# Patient Record
Sex: Female | Born: 1975
Health system: Southern US, Community
[De-identification: ages and names within clinical notes are randomized; demographics above are authoritative.]

## PROBLEM LIST (undated history)

## (undated) ENCOUNTER — Inpatient Hospital Stay (HOSPITAL_COMMUNITY): Payer: Self-pay

## (undated) ENCOUNTER — Emergency Department (HOSPITAL_COMMUNITY): Admission: EM | Payer: MEDICAID | Source: Home / Self Care

## (undated) DIAGNOSIS — F32A Depression, unspecified: Secondary | ICD-10-CM

## (undated) DIAGNOSIS — M1990 Unspecified osteoarthritis, unspecified site: Secondary | ICD-10-CM

## (undated) DIAGNOSIS — R519 Headache, unspecified: Secondary | ICD-10-CM

## (undated) DIAGNOSIS — C50919 Malignant neoplasm of unspecified site of unspecified female breast: Secondary | ICD-10-CM

## (undated) DIAGNOSIS — Z789 Other specified health status: Secondary | ICD-10-CM

## (undated) HISTORY — PX: NO PAST SURGERIES: SHX2092

---

## 2016-08-04 ENCOUNTER — Encounter: Payer: Self-pay | Admitting: General Practice

## 2016-08-04 ENCOUNTER — Ambulatory Visit (INDEPENDENT_AMBULATORY_CARE_PROVIDER_SITE_OTHER): Payer: Medicaid Other | Admitting: General Practice

## 2016-08-04 DIAGNOSIS — Z3201 Encounter for pregnancy test, result positive: Secondary | ICD-10-CM | POA: Diagnosis present

## 2016-08-04 LAB — POCT PREGNANCY, URINE: PREG TEST UR: POSITIVE — AB

## 2016-08-04 NOTE — Progress Notes (Signed)
Patient here for UPT today. UPT +. Reports first positive home test 2 days ago. LMP approximately 06/28/16 plus or minus one day in either direction. EDD 04/04/17. Patient is currently taking multivitamin, folic acid, fish oil & biotin vitamins. Takes tylenol PM as needed. No prescription medications. Recommended she begin prenatal care in 5-6 weeks at office of her choice. No medical history. Also recommended that she could discontinue multivitamin and folic acid & only take PNV if she would like. Patient verbalized understanding to all. Pregnancy verification letter provided. Patient had no questions

## 2016-08-10 ENCOUNTER — Encounter (HOSPITAL_COMMUNITY): Payer: Self-pay | Admitting: *Deleted

## 2016-08-10 ENCOUNTER — Inpatient Hospital Stay (HOSPITAL_COMMUNITY)
Admission: AD | Admit: 2016-08-10 | Discharge: 2016-08-10 | Disposition: A | Discharge status: Home / Self Care | Payer: Medicaid Other | Source: Ambulatory Visit | Attending: Obstetrics & Gynecology | Admitting: Obstetrics & Gynecology

## 2016-08-10 DIAGNOSIS — O210 Mild hyperemesis gravidarum: Secondary | ICD-10-CM | POA: Diagnosis not present

## 2016-08-10 DIAGNOSIS — Z3A01 Less than 8 weeks gestation of pregnancy: Secondary | ICD-10-CM | POA: Diagnosis not present

## 2016-08-10 DIAGNOSIS — R1030 Lower abdominal pain, unspecified: Secondary | ICD-10-CM | POA: Insufficient documentation

## 2016-08-10 LAB — URINALYSIS, ROUTINE W REFLEX MICROSCOPIC
Bilirubin Urine: NEGATIVE
GLUCOSE, UA: NEGATIVE mg/dL
Ketones, ur: NEGATIVE mg/dL
LEUKOCYTES UA: NEGATIVE
Nitrite: NEGATIVE
PH: 5.5 (ref 5.0–8.0)
Protein, ur: 30 mg/dL — AB
SPECIFIC GRAVITY, URINE: 1.025 (ref 1.005–1.030)

## 2016-08-10 LAB — URINE MICROSCOPIC-ADD ON

## 2016-08-10 MED ORDER — PROMETHAZINE HCL 25 MG PO TABS: 25.0000 mg | ORAL_TABLET | Freq: Four times a day (QID) | ORAL | 0 refills | Status: DC | PRN

## 2016-08-10 NOTE — Discharge Instructions (Signed)
Return if you have worsening abdominal pain or vaginal bleeding. You can use tylenol for pain or Vitamin B6 and Plain Unisom at night for feeling sick. Drink 8 glasses of water every day.   Morning Sickness Morning sickness is when you feel sick to your stomach (nauseous) during pregnancy. You may feel sick to your stomach and throw up (vomit). You may feel sick in the morning, but you can feel this way any time of day. Some women feel very sick to their stomach and cannot stop throwing up (hyperemesis gravidarum). HOME CARE  Only take medicines as told by your doctor.  Take multivitamins as told by your doctor. Taking multivitamins before getting pregnant can stop or lessen the harshness of morning sickness.  Eat dry toast or unsalted crackers before getting out of bed.  Eat 5 to 6 small meals a day.  Eat dry and bland foods like rice and baked potatoes.  Do not drink liquids with meals. Drink between meals.  Do not eat greasy, fatty, or spicy foods.  Have someone cook for you if the smell of food causes you to feel sick or throw up.  If you feel sick to your stomach after taking prenatal vitamins, take them at night or with a snack.  Eat protein when you need a snack (nuts, yogurt, cheese).  Eat unsweetened gelatins for dessert.  Wear a bracelet used for sea sickness (acupressure wristband).  Go to a doctor that puts thin needles into certain body points (acupuncture) to improve how you feel.  Do not smoke.  Use a humidifier to keep the air in your house free of odors.  Get lots of fresh air. GET HELP IF:  You need medicine to feel better.  You feel dizzy or lightheaded.  You are losing weight. GET HELP RIGHT AWAY IF:   You feel very sick to your stomach and cannot stop throwing up.  You pass out (faint). MAKE SURE YOU:  Understand these instructions.  Will watch your condition.  Will get help right away if you are not doing well or get worse.   This  information is not intended to replace advice given to you by your health care provider. Make sure you discuss any questions you have with your health care provider.   Document Released: 12/11/2004 Document Revised: 11/24/2014 Document Reviewed: 04/20/2013 Elsevier Interactive Patient Education [employer].

## 2016-08-10 NOTE — MAU Note (Addendum)
Pt presents to MAU with complaints of feeling nauseous for the last couple of days. Denies any vaginal bleeding or abnormal discharge  Pt reports cramping on and off, denies pain at present

## 2016-08-10 NOTE — MAU Provider Note (Signed)
History     CSN: [id number]  Arrival date and time: 08/10/16 1133   First Provider Initiated Contact with Patient 08/10/16 1209      No chief complaint on file.  HPI Wanda Garcia 40 y.o. [redacted]w[redacted]d   Comes to MAU with lower abdominal pain and nausea.  Due to her age, she is concerned about the baby.  She and her husband want the baby checked to see if everything is OK.  [facility]Explained that the evaluation would take 1-2 hours to get labs and ultrasound and the husband states they cannot stay that long - he needs to go to work.  They can return later today or tomorrow.    OB History    Gravida Para Term Preterm AB Living   1             SAB TAB Ectopic Multiple Live Births                  History reviewed. No pertinent past medical history.  History reviewed. No pertinent surgical history.  History reviewed. No pertinent family history.  Social History  Substance Use Topics  . Smoking status: Never Smoker  . Smokeless tobacco: Never Used  . Alcohol use No    Allergies: Allergies no known allergies  No prescriptions prior to admission.    ROS Physical Exam   Blood pressure 130/73, pulse 95, temperature 100.1 F (37.8 C), resp. rate 18, weight 128 lb (58.1 kg), last menstrual period 07/01/2016.  Physical Exam  Nursing note and vitals reviewed. Constitutional: She is oriented to person, place, and time. She appears well-developed and well-nourished.  HENT:  Head: Normocephalic.  Eyes: EOM are normal.  Neck: Neck supple.  GI: Soft. There is tenderness. There is no rebound and no guarding.  Pain with palpation in all quadrants.  Worse in lower quadrants bilaterally.  Musculoskeletal: Normal range of motion.  Neurological: She is alert and oriented to person, place, and time.  Skin: Skin is warm and dry.  Psychiatric: She has a normal mood and affect.    MAU Course  Procedures Results for orders placed or  performed during the hospital encounter of 08/10/16 (from the past 24 hour(s))  Urinalysis, Routine w reflex microscopic (not at [facility])     Status: Abnormal   Collection Time: 08/10/16 11:30 AM  Result Value Ref Range   Color, Urine YELLOW YELLOW   APPearance CLEAR CLEAR   Specific Gravity, Urine 1.025 1.005 - 1.030   pH 5.5 5.0 - 8.0   Glucose, UA NEGATIVE NEGATIVE mg/dL   Hgb urine dipstick TRACE (A) NEGATIVE   Bilirubin Urine NEGATIVE NEGATIVE   Ketones, ur NEGATIVE NEGATIVE mg/dL   Protein, ur 30 (A) NEGATIVE mg/dL   Nitrite NEGATIVE NEGATIVE   Leukocytes, UA NEGATIVE NEGATIVE  Urine microscopic-add on     Status: Abnormal   Collection Time: 08/10/16 11:30 AM  Result Value Ref Range   Squamous Epithelial / LPF 6-30 (A) NONE SEEN   WBC, UA 0-5 0 - 5 WBC/hpf   RBC / HPF 0-5 0 - 5 RBC/hpf   Bacteria, UA RARE (A) NONE SEEN   Urine-Other MUCOUS PRESENT     MDM Encouraged client to return if she continues to have problems with abdominal pain.  Gave her prescription for Phenergan to help with nausea and advised her to take 1/2 half tablet at a time.  Client asked her partner to step  outside and she explained to me that she really is 40 years old and she is very nervous about the health of the baby due to her age.  She and her partner are from [place].  Advised we could do blood work today and she could return for the results, but the client declined any blood work for now.  Assessment and Plan  Morning sickness Abdominal pain - not evaluated  Plan Return if pain is worsening and labs and ultrasound will be done to evaluate her condition.  Advised that the pregnancy may be so early that the pregnancy may or may not be seen on ultrasound. Return if you have worsening abdominal pain or vaginal bleeding. You can use tylenol for pain or Vitamin B6 and Plain Unisom at night for feeling sick. Drink 8 glasses of water every day.   Terri L Burleson 08/10/2016, 12:19 PM

## 2016-08-18 ENCOUNTER — Inpatient Hospital Stay (HOSPITAL_COMMUNITY): Payer: Medicaid Other

## 2016-08-18 ENCOUNTER — Inpatient Hospital Stay (HOSPITAL_COMMUNITY)
Admission: AD | Admit: 2016-08-18 | Discharge: 2016-08-18 | Disposition: A | Discharge status: Home / Self Care | Payer: Medicaid Other | Source: Ambulatory Visit | Attending: Obstetrics and Gynecology | Admitting: Obstetrics and Gynecology

## 2016-08-18 ENCOUNTER — Encounter (HOSPITAL_COMMUNITY): Payer: Self-pay

## 2016-08-18 DIAGNOSIS — Z3A01 Less than 8 weeks gestation of pregnancy: Secondary | ICD-10-CM | POA: Diagnosis not present

## 2016-08-18 DIAGNOSIS — O26891 Other specified pregnancy related conditions, first trimester: Secondary | ICD-10-CM | POA: Diagnosis not present

## 2016-08-18 DIAGNOSIS — R102 Pelvic and perineal pain: Secondary | ICD-10-CM | POA: Insufficient documentation

## 2016-08-18 LAB — CBC
HCT: 36.1 % (ref 36.0–46.0)
HEMOGLOBIN: 12.2 g/dL (ref 12.0–15.0)
MCH: 25.9 pg — ABNORMAL LOW (ref 26.0–34.0)
MCHC: 33.8 g/dL (ref 30.0–36.0)
MCV: 76.6 fL — ABNORMAL LOW (ref 78.0–100.0)
PLATELETS: 144 10*3/uL — AB (ref 150–400)
RBC: 4.71 MIL/uL (ref 3.87–5.11)
RDW: 14.6 % (ref 11.5–15.5)
WBC: 9 10*3/uL (ref 4.0–10.5)

## 2016-08-18 LAB — URINALYSIS, ROUTINE W REFLEX MICROSCOPIC
Bilirubin Urine: NEGATIVE
Glucose, UA: NEGATIVE mg/dL
Hgb urine dipstick: NEGATIVE
KETONES UR: NEGATIVE mg/dL
LEUKOCYTES UA: NEGATIVE
NITRITE: NEGATIVE
PROTEIN: NEGATIVE mg/dL
Specific Gravity, Urine: 1.015 (ref 1.005–1.030)
pH: 5.5 (ref 5.0–8.0)

## 2016-08-18 LAB — WET PREP, GENITAL
Sperm: NONE SEEN
Trich, Wet Prep: NONE SEEN
Yeast Wet Prep HPF POC: NONE SEEN

## 2016-08-18 LAB — HCG, QUANTITATIVE, PREGNANCY: HCG, BETA CHAIN, QUANT, S: 74132 m[IU]/mL — AB (ref <5)

## 2016-08-18 LAB — ABO/RH: ABO/RH(D): A POS

## 2016-08-18 NOTE — Discharge Instructions (Signed)
First Trimester of Pregnancy The first trimester of pregnancy is from week 1 until the end of week 12 (months 1 through 3). A week after a sperm fertilizes an egg, the egg will implant on the wall of the uterus. This embryo will begin to develop into a baby. Genes from you and your partner are forming the baby. The female genes determine whether the baby is a boy or a girl. At 6-8 weeks, the eyes and face are formed, and the heartbeat can be seen on ultrasound. At the end of 12 weeks, all the baby's organs are formed.  Now that you are pregnant, you will want to do everything you can to have a healthy baby. Two of the most important things are to get good prenatal care and to follow your health care provider's instructions. Prenatal care is all the medical care you receive before the baby's birth. This care will help prevent, find, and treat any problems during the pregnancy and childbirth. BODY CHANGES Your body goes through many changes during pregnancy. The changes vary from woman to woman.   You may gain or lose a couple of pounds at first.  You may feel sick to your stomach (nauseous) and throw up (vomit). If the vomiting is uncontrollable, call your health care provider.  You may tire easily.  You may develop headaches that can be relieved by medicines approved by your health care provider.  You may urinate more often. Painful urination may mean you have a bladder infection.  You may develop heartburn as a result of your pregnancy.  You may develop constipation because certain hormones are causing the muscles that push waste through your intestines to slow down.  You may develop hemorrhoids or swollen, bulging veins (varicose veins).  Your breasts may begin to grow larger and become tender. Your nipples may stick out more, and the tissue that surrounds them (areola) may become darker.  Your gums may bleed and may be sensitive to brushing and flossing.  Dark spots or blotches (chloasma,  mask of pregnancy) may develop on your face. This will likely fade after the baby is born.  Your menstrual periods will stop.  You may have a loss of appetite.  You may develop cravings for certain kinds of food.  You may have changes in your emotions from day to day, such as being excited to be pregnant or being concerned that something may go wrong with the pregnancy and baby.  You may have more vivid and strange dreams.  You may have changes in your hair. These can include thickening of your hair, rapid growth, and changes in texture. Some women also have hair loss during or after pregnancy, or hair that feels dry or thin. Your hair will most likely return to normal after your baby is born. WHAT TO EXPECT AT YOUR PRENATAL VISITS During a routine prenatal visit:  You will be weighed to make sure you and the baby are growing normally.  Your blood pressure will be taken.  Your abdomen will be measured to track your baby's growth.  The fetal heartbeat will be listened to starting around week 10 or 12 of your pregnancy.  Test results from any previous visits will be discussed. Your health care provider may ask you:  How you are feeling.  If you are feeling the baby move.  If you have had any abnormal symptoms, such as leaking fluid, bleeding, severe headaches, or abdominal cramping.  If you are using any tobacco products,   including cigarettes, chewing tobacco, and electronic cigarettes.  If you have any questions. Other tests that may be performed during your first trimester include:  Blood tests to find your blood type and to check for the presence of any previous infections. They will also be used to check for low iron levels (anemia) and Rh antibodies. Later in the pregnancy, blood tests for diabetes will be done along with other tests if problems develop.  Urine tests to check for infections, diabetes, or protein in the urine.  An ultrasound to confirm the proper growth  and development of the baby.  An amniocentesis to check for possible genetic problems.  Fetal screens for spina bifida and Down syndrome.  You may need other tests to make sure you and the baby are doing well.  HIV (human immunodeficiency virus) testing. Routine prenatal testing includes screening for HIV, unless you choose not to have this test. HOME CARE INSTRUCTIONS  Medicines  Follow your health care provider's instructions regarding medicine use. Specific medicines may be either safe or unsafe to take during pregnancy.  Take your prenatal vitamins as directed.  If you develop constipation, try taking a stool softener if your health care provider approves. Diet  Eat regular, well-balanced meals. Choose a variety of foods, such as meat or vegetable-based protein, fish, milk and low-fat dairy products, vegetables, fruits, and whole grain breads and cereals. Your health care provider will help you determine the amount of weight gain that is right for you.  Avoid raw meat and uncooked cheese. These carry germs that can cause birth defects in the baby.  Eating four or five small meals rather than three large meals a day may help relieve nausea and vomiting. If you start to feel nauseous, eating a few soda crackers can be helpful. Drinking liquids between meals instead of during meals also seems to help nausea and vomiting.  If you develop constipation, eat more high-fiber foods, such as fresh vegetables or fruit and whole grains. Drink enough fluids to keep your urine clear or pale yellow. Activity and Exercise  Exercise only as directed by your health care provider. Exercising will help you:  Control your weight.  Stay in shape.  Be prepared for labor and delivery.  Experiencing pain or cramping in the lower abdomen or low back is a good sign that you should stop exercising. Check with your health care provider before continuing normal exercises.  Try to avoid standing for long  periods of time. Move your legs often if you must stand in one place for a long time.  Avoid heavy lifting.  Wear low-heeled shoes, and practice good posture.  You may continue to have sex unless your health care provider directs you otherwise. Relief of Pain or Discomfort  Wear a good support bra for breast tenderness.   Take warm sitz baths to soothe any pain or discomfort caused by hemorrhoids. Use hemorrhoid cream if your health care provider approves.   Rest with your legs elevated if you have leg cramps or low back pain.  If you develop varicose veins in your legs, wear support hose. Elevate your feet for 15 minutes, 3-4 times a day. Limit salt in your diet. Prenatal Care  Schedule your prenatal visits by the twelfth week of pregnancy. They are usually scheduled monthly at first, then more often in the last 2 months before delivery.  Write down your questions. Take them to your prenatal visits.  Keep all your prenatal visits as directed by your   health care provider. Safety  Wear your seat belt at all times when driving.  Make a list of emergency phone numbers, including numbers for family, friends, the hospital, and police and fire departments. General Tips  Ask your health care provider for a referral to a local prenatal education class. Begin classes no later than at the beginning of month 6 of your pregnancy.  Ask for help if you have counseling or nutritional needs during pregnancy. Your health care provider can offer advice or refer you to specialists for help with various needs.  Do not use hot tubs, steam rooms, or saunas.  Do not douche or use tampons or scented sanitary pads.  Do not cross your legs for long periods of time.  Avoid cat litter boxes and soil used by cats. These carry germs that can cause birth defects in the baby and possibly loss of the fetus by miscarriage or stillbirth.  Avoid all smoking, herbs, alcohol, and medicines not prescribed by  your health care provider. Chemicals in these affect the formation and growth of the baby.  Do not use any tobacco products, including cigarettes, chewing tobacco, and electronic cigarettes. If you need help quitting, ask your health care provider. You may receive counseling support and other resources to help you quit.  Schedule a dentist appointment. At home, brush your teeth with a soft toothbrush and be gentle when you floss. SEEK MEDICAL CARE IF:   You have dizziness.  You have mild pelvic cramps, pelvic pressure, or nagging pain in the abdominal area.  You have persistent nausea, vomiting, or diarrhea.  You have a bad smelling vaginal discharge.  You have pain with urination.  You notice increased swelling in your face, hands, legs, or ankles. SEEK IMMEDIATE MEDICAL CARE IF:   You have a fever.  You are leaking fluid from your vagina.  You have spotting or bleeding from your vagina.  You have severe abdominal cramping or pain.  You have rapid weight gain or loss.  You vomit blood or material that looks like coffee grounds.  You are exposed to German measles and have never had them.  You are exposed to fifth disease or chickenpox.  You develop a severe headache.  You have shortness of breath.  You have any kind of trauma, such as from a fall or a car accident.   This information is not intended to replace advice given to you by your health care provider. Make sure you discuss any questions you have with your health care provider.   Document Released: 10/28/2001 Document Revised: 11/24/2014 Document Reviewed: 09/13/2013 Elsevier Interactive Patient Education 2016 Elsevier [employer]  

## 2016-08-18 NOTE — MAU Provider Note (Signed)
History     CSN: [id number]  Arrival date and time: 08/18/16 [id number]   First Provider Initiated Contact with Patient 08/18/16 2232      Chief Complaint  Patient presents with  . Pelvic Pain   Pelvic Pain  The patient's primary symptoms include pelvic pain. This is a new problem. The current episode started 1 to 4 weeks ago. The problem occurs intermittently. The problem has been unchanged. The pain is severe. The problem affects both sides. She is pregnant. Associated symptoms include constipation. Pertinent negatives include no abdominal pain, chills, diarrhea, dysuria, fever, frequency, nausea, urgency or vomiting. The vaginal discharge was normal. There has been no bleeding. Nothing aggravates the symptoms. She has tried nothing for the symptoms.    History reviewed. No pertinent past medical history.  History reviewed. No pertinent surgical history.  History reviewed. No pertinent family history.  Social History  Substance Use Topics  . Smoking status: Never Smoker  . Smokeless tobacco: Never Used  . Alcohol use No    Allergies: No Known Allergies  Prescriptions Prior to Admission  Medication Sig Dispense Refill Last Dose  . Prenatal Vit-Fe Fumarate-FA (PRENATAL MULTIVITAMIN) TABS tablet Take 1 tablet by mouth daily at 12 noon.   08/18/2016 at Unknown time  . promethazine (PHENERGAN) 25 MG tablet Take 1 tablet (25 mg total) by mouth every 6 (six) hours as needed for nausea or vomiting. (Patient not taking: Reported on 08/18/2016) 20 tablet 0 Not Taking at Unknown time    Review of Systems  Constitutional: Negative for chills and fever.  Gastrointestinal: Positive for constipation. Negative for abdominal pain, diarrhea, nausea and vomiting.  Genitourinary: Positive for pelvic pain. Negative for dysuria, frequency and urgency.   Physical Exam   Blood pressure 111/60, pulse 84, temperature 98.7 F (37.1 C), temperature source Oral, resp. rate 18, height 4' 11.5" (1.511 m),  weight 131 lb 6.4 oz (59.6 kg), last menstrual period 07/01/2016, SpO2 100 %.  Physical Exam  Nursing note and vitals reviewed. Constitutional: She is oriented to person, place, and time. She appears well-developed and well-nourished. No distress.  HENT:  Head: Normocephalic.  Cardiovascular: Normal rate.   Respiratory: Effort normal.  GI: Soft. There is no tenderness. There is no rebound.  Neurological: She is alert and oriented to person, place, and time.  Skin: Skin is warm and dry.  Psychiatric: She has a normal mood and affect.   Results for orders placed or performed during the hospital encounter of 08/18/16 (from the past 24 hour(s))  Urinalysis, Routine w reflex microscopic (not at [facility])     Status: None   Collection Time: 08/18/16  6:47 PM  Result Value Ref Range   Color, Urine YELLOW YELLOW   APPearance CLEAR CLEAR   Specific Gravity, Urine 1.015 1.005 - 1.030   pH 5.5 5.0 - 8.0   Glucose, UA NEGATIVE NEGATIVE mg/dL   Hgb urine dipstick NEGATIVE NEGATIVE   Bilirubin Urine NEGATIVE NEGATIVE   Ketones, ur NEGATIVE NEGATIVE mg/dL   Protein, ur NEGATIVE NEGATIVE mg/dL   Nitrite NEGATIVE NEGATIVE   Leukocytes, UA NEGATIVE NEGATIVE  CBC     Status: Abnormal   Collection Time: 08/18/16  8:50 PM  Result Value Ref Range   WBC 9.0 4.0 - 10.5 K/uL   RBC 4.71 3.87 - 5.11 MIL/uL   Hemoglobin 12.2 12.0 - 15.0 g/dL   HCT 36.1 36.0 - 46.0 %   MCV 76.6 (L) 78.0 - 100.0 fL   MCH 25.9 (L)  26.0 - 34.0 pg   MCHC 33.8 30.0 - 36.0 g/dL   RDW 14.6 11.5 - 15.5 %   Platelets 144 (L) 150 - 400 K/uL  hCG, quantitative, pregnancy     Status: Abnormal   Collection Time: 08/18/16  8:50 PM  Result Value Ref Range   hCG, Beta Chain, Quant, S 74,132 (H) <5 mIU/mL  ABO/Rh     Status: None (Preliminary result)   Collection Time: 08/18/16  8:50 PM  Result Value Ref Range   ABO/RH(D) A POS    US Ob Comp Less 14 Wks  Result Date: 08/18/2016 CLINICAL DATA:  Pelvic pain for 3 weeks EXAM:  OBSTETRIC <14 WK [place] AND TRANSVAGINAL OB US TECHNIQUE: Both transabdominal and transvaginal ultrasound examinations were performed for complete evaluation of the gestation as well as the maternal uterus, adnexal regions, and pelvic cul-de-sac. Transvaginal technique was performed to assess early pregnancy. COMPARISON:  None. FINDINGS: Intrauterine gestational sac: Single Yolk sac:  Present Embryo:  Present Cardiac Activity: Present Heart Rate: 148  bpm MSD: Not applicable CRL:  [id number]  mm   7 w   1 d                  [place] EDC: 04/05/2017 Subchorionic hemorrhage:  None visualized. Maternal uterus/adnexae: Anechoic simple appearing right ovarian 3.4 x 3.1 x 3.5 cm cyst. Unremarkable left ovary measuring 4 x 1.8 x 1.9 cm. No uterine mass noted. IMPRESSION: Seven week 1 day single live intrauterine gestation. Anechoic simple appearing right ovarian 3.4 x 3.1 x 3.5 cm cyst Electronically Signed   By: Ashley Royalty M.D.   On: 08/18/2016 22:21   US Ob Transvaginal  Result Date: 08/18/2016 CLINICAL DATA:  Pelvic pain for 3 weeks EXAM: OBSTETRIC <14 WK [place] AND TRANSVAGINAL OB US TECHNIQUE: Both transabdominal and transvaginal ultrasound examinations were performed for complete evaluation of the gestation as well as the maternal uterus, adnexal regions, and pelvic cul-de-sac. Transvaginal technique was performed to assess early pregnancy. COMPARISON:  None. FINDINGS: Intrauterine gestational sac: Single Yolk sac:  Present Embryo:  Present Cardiac Activity: Present Heart Rate: 148  bpm MSD: Not applicable CRL:  [id number]  mm   7 w   1 d                  [place] EDC: 04/05/2017 Subchorionic hemorrhage:  None visualized. Maternal uterus/adnexae: Anechoic simple appearing right ovarian 3.4 x 3.1 x 3.5 cm cyst. Unremarkable left ovary measuring 4 x 1.8 x 1.9 cm. No uterine mass noted. IMPRESSION: Seven week 1 day single live intrauterine gestation. Anechoic simple appearing right ovarian 3.4 x 3.1 x 3.5 cm cyst Electronically Signed   By: Ashley Royalty M.D.   On: 08/18/2016 22:21    MAU Course  Procedures  MDM   Assessment and Plan   1. Pelvic pain affecting pregnancy in first trimester, antepartum    DC home Comfort measures reviewed  1stTrimester precautions  RX: none, OTC treatments discussed  Return to MAU as needed FU with OB as planned  [place] .   Contact information: [place] [address] [phone]            Mathis Bud 08/18/2016, 10:36 PM

## 2016-08-18 NOTE — MAU Note (Signed)
Pt is having pain in her lower left and right abdomen.  Pt states it burns when she pees.  Pt states she is having trouble having a bowel movement.

## 2016-08-19 LAB — GC/CHLAMYDIA PROBE AMP (~~LOC~~) NOT AT ARMC
Chlamydia: NEGATIVE
NEISSERIA GONORRHEA: NEGATIVE

## 2016-08-19 LAB — HIV ANTIBODY (ROUTINE TESTING W REFLEX): HIV SCREEN 4TH GENERATION: NONREACTIVE

## 2016-08-20 LAB — URINE CULTURE

## 2016-09-11 ENCOUNTER — Encounter (HOSPITAL_COMMUNITY): Payer: Self-pay | Admitting: Nurse Practitioner

## 2016-09-11 ENCOUNTER — Other Ambulatory Visit (HOSPITAL_COMMUNITY): Payer: Self-pay | Admitting: Nurse Practitioner

## 2016-09-11 DIAGNOSIS — Z3682 Encounter for antenatal screening for nuchal translucency: Secondary | ICD-10-CM

## 2016-09-11 DIAGNOSIS — Z3A12 12 weeks gestation of pregnancy: Secondary | ICD-10-CM

## 2016-09-11 DIAGNOSIS — O09521 Supervision of elderly multigravida, first trimester: Secondary | ICD-10-CM

## 2016-09-11 LAB — OB RESULTS CONSOLE GC/CHLAMYDIA
CHLAMYDIA, DNA PROBE: NEGATIVE
GC PROBE AMP, GENITAL: NEGATIVE

## 2016-09-11 LAB — OB RESULTS CONSOLE RUBELLA ANTIBODY, IGM: Rubella: IMMUNE

## 2016-09-11 LAB — OB RESULTS CONSOLE RPR: RPR: NONREACTIVE

## 2016-09-11 LAB — OB RESULTS CONSOLE HIV ANTIBODY (ROUTINE TESTING): HIV: NONREACTIVE

## 2016-09-11 LAB — OB RESULTS CONSOLE ANTIBODY SCREEN: ANTIBODY SCREEN: NEGATIVE

## 2016-09-11 LAB — OB RESULTS CONSOLE ABO/RH: RH Type: POSITIVE

## 2016-09-11 LAB — OB RESULTS CONSOLE HEPATITIS B SURFACE ANTIGEN: Hepatitis B Surface Ag: NEGATIVE

## 2016-09-24 ENCOUNTER — Other Ambulatory Visit (HOSPITAL_COMMUNITY): Payer: Self-pay | Admitting: *Deleted

## 2016-09-24 ENCOUNTER — Ambulatory Visit (HOSPITAL_COMMUNITY)
Admission: RE | Admit: 2016-09-24 | Discharge: 2016-09-24 | Disposition: A | Discharge status: Home / Self Care | Payer: Medicaid Other | Source: Ambulatory Visit | Attending: Nurse Practitioner | Admitting: Nurse Practitioner

## 2016-09-24 ENCOUNTER — Encounter (HOSPITAL_COMMUNITY): Payer: Self-pay

## 2016-09-24 DIAGNOSIS — Z3A12 12 weeks gestation of pregnancy: Secondary | ICD-10-CM | POA: Diagnosis not present

## 2016-09-24 DIAGNOSIS — O09521 Supervision of elderly multigravida, first trimester: Secondary | ICD-10-CM | POA: Diagnosis not present

## 2016-09-24 DIAGNOSIS — Z3682 Encounter for antenatal screening for nuchal translucency: Secondary | ICD-10-CM | POA: Diagnosis not present

## 2016-09-24 DIAGNOSIS — Z315 Encounter for genetic counseling: Secondary | ICD-10-CM | POA: Insufficient documentation

## 2016-09-24 DIAGNOSIS — O09511 Supervision of elderly primigravida, first trimester: Secondary | ICD-10-CM

## 2016-09-24 DIAGNOSIS — O09519 Supervision of elderly primigravida, unspecified trimester: Secondary | ICD-10-CM

## 2016-09-24 HISTORY — DX: Other specified health status: Z78.9

## 2016-09-24 NOTE — Progress Notes (Signed)
Appointment Date:  DOB: 1976/07/16 Referring Provider: Nicholaus Bloom, NP Attending:  Mrs. Lyndsi Mauger and her husband, Temple Pacini, were seen for genetic counseling because of a maternal age of 40 y.o..     In summary:  Discussed AMA and associated risk for fetal aneuploidy  Discussed options for screening  First screen  Quad screen  NIPS  Ultrasound  Discussed diagnostic testing options  [facility]  Amniocentesis  Reviewed family history concerns - none reported  Discussed carrier screening options - declined  CF  SMA  Hemoglobinopathies  They were counseled regarding maternal age and the association with risk for chromosome conditions due to nondisjunction with aging of the ova.   We reviewed chromosomes, nondisjunction, and the associated 1 in 59 risk for fetal aneuploidy related to a maternal age of 40 y.o. at [redacted]w[redacted]d gestation.  They were counseled that the risk for aneuploidy decreases as gestational age increases, accounting for those pregnancies which spontaneously abort.  We specifically discussed Down syndrome (trisomy 87), trisomies 18 and 34, and sex chromosome aneuploidies (47,XXX and 47,XXY) including the common features and prognoses of each.   We reviewed available screening options including First Screen, Quad screen, noninvasive prenatal screening (NIPS)/cell free DNA (cfDNA) screening, and detailed ultrasound.  They were counseled that screening tests are used to modify a patient's a priori risk for aneuploidy, typically based on age. This estimate provides a pregnancy specific risk assessment. We reviewed the benefits and limitations of each option. Specifically, we discussed the conditions for which each test screens, the detection rates, and false positive rates of each. They were also counseled regarding diagnostic testing via [facility] and amniocentesis. We reviewed the approximate 1 in [id number] risk for complications from amniocentesis, including spontaneous pregnancy  loss. We discussed the possible results that the tests might provide including: positive, negative, unanticipated, and no result. Finally, they were counseled regarding the cost of each option and potential out of pocket expenses.  After consideration of all the options, she elected to proceed with NIPS.  Those results will be available in 8-10 days.    A nuchal translucency ultrasound was also performed today.  The report will be documented separately.  The patient would like to return for a detailed ultrasound at ~18+ weeks gestation.  This appointment was scheduled today. They understand that screening tests, including NIPS and ultrasound, cannot rule out all birth defects or genetic syndromes.   Mrs. Coulston was provided with written information regarding cystic fibrosis (CF), spinal muscular atrophy (SMA) and hemoglobinopathies including the carrier frequency, availability of carrier screening and prenatal diagnosis if indicated.  In addition, we discussed that CF and hemoglobinopathies are routinely screened for as part of the [place] newborn screening panel.  After further discussion, she  declined screening for CF, SMA and hemoglobinopathies.  Both family histories were reviewed and found to be noncontributory for birth defects, intellectual disability, and known genetic conditions. Without further information regarding the provided family history, an accurate genetic risk cannot be calculated. Further genetic counseling is warranted if more information is obtained.  Mrs. Sladek denied exposure to environmental toxins or chemical agents. She denied the use of alcohol tobacco or street drugs. She denied significant viral illnesses during the course of her pregnancy. Her medical and surgical histories were noncontributory.   I counseled this couple regarding the above risks and available options.  The approximate face-to-face time with the genetic counselor was 45 minutes.  Cam Hai, MS,   Certified Genetic Counselor

## 2016-09-25 ENCOUNTER — Other Ambulatory Visit (HOSPITAL_COMMUNITY): Payer: Self-pay | Admitting: *Deleted

## 2016-10-01 ENCOUNTER — Telehealth (HOSPITAL_COMMUNITY): Payer: Self-pay | Admitting: Genetics

## 2016-10-01 NOTE — Telephone Encounter (Signed)
Called Mr. Antony Blackbird, Ms. Olejniczak's husband, to review the Panorama results.  She had requested that I call him regarding her results when they were available.  There was no answer and I left a message for him to return my call to discuss results.

## 2016-10-01 NOTE — Telephone Encounter (Signed)
Wanda Garcia's husband, Wanda Garcia, returned my call to discuss his wife's prenatal cell free DNA screening results.  Wanda Garcia had Panorama testing through [facility].  Testing was offered because of AMA.   The patient was identified by name and DOB.  We reviewed that these are within normal limits, showing a less than 1 in 10,000 risk for trisomies 21, 18 and 13, and monosomy X (Turner syndrome).  In addition, the risk for triploidy/vanishing twin and sex chromosome trisomies (47,XXX and 47,XXY) was also low risk. We reviewed that this testing identifies > 99% of pregnancies with trisomy 15, trisomy 60, sex chromosome trisomies (47,XXX and 47,XXY), and triploidy. The detection rate for trisomy 18 is 96%.  The detection rate for monosomy X is ~92%.  The false positive rate is <0.1% for all conditions. Testing was also consistent with female fetal sex, however, Wanda Garcia did not want to know the sex of the baby until he had a chance to ask if wife if she wanted to know.  He stated that if they wanted to know he would call back when they both could be on the phone and hear at the same time.  They understand that this testing does not identify all genetic conditions.  All questions were answered to his satisfaction, he was encouraged to call with additional questions or concerns.  Cam Hai, MS Certified Genetic Counselor

## 2016-10-02 ENCOUNTER — Other Ambulatory Visit (HOSPITAL_COMMUNITY): Payer: Self-pay

## 2016-10-04 ENCOUNTER — Inpatient Hospital Stay (HOSPITAL_COMMUNITY)
Admission: AD | Admit: 2016-10-04 | Discharge: 2016-10-05 | Disposition: A | Discharge status: Home / Self Care | Payer: Medicaid Other | Source: Ambulatory Visit | Attending: Family Medicine | Admitting: Family Medicine

## 2016-10-04 DIAGNOSIS — Z3A13 13 weeks gestation of pregnancy: Secondary | ICD-10-CM | POA: Insufficient documentation

## 2016-10-04 DIAGNOSIS — B9689 Other specified bacterial agents as the cause of diseases classified elsewhere: Secondary | ICD-10-CM | POA: Insufficient documentation

## 2016-10-04 DIAGNOSIS — K5900 Constipation, unspecified: Secondary | ICD-10-CM | POA: Insufficient documentation

## 2016-10-04 DIAGNOSIS — N760 Acute vaginitis: Secondary | ICD-10-CM | POA: Insufficient documentation

## 2016-10-04 DIAGNOSIS — O23591 Infection of other part of genital tract in pregnancy, first trimester: Secondary | ICD-10-CM | POA: Insufficient documentation

## 2016-10-04 DIAGNOSIS — N888 Other specified noninflammatory disorders of cervix uteri: Secondary | ICD-10-CM

## 2016-10-04 DIAGNOSIS — O99611 Diseases of the digestive system complicating pregnancy, first trimester: Secondary | ICD-10-CM

## 2016-10-05 ENCOUNTER — Encounter (HOSPITAL_COMMUNITY): Payer: Self-pay | Admitting: Certified Nurse Midwife

## 2016-10-05 DIAGNOSIS — N888 Other specified noninflammatory disorders of cervix uteri: Secondary | ICD-10-CM

## 2016-10-05 DIAGNOSIS — N760 Acute vaginitis: Secondary | ICD-10-CM | POA: Diagnosis not present

## 2016-10-05 DIAGNOSIS — O23591 Infection of other part of genital tract in pregnancy, first trimester: Secondary | ICD-10-CM | POA: Diagnosis not present

## 2016-10-05 DIAGNOSIS — B9689 Other specified bacterial agents as the cause of diseases classified elsewhere: Secondary | ICD-10-CM | POA: Diagnosis not present

## 2016-10-05 DIAGNOSIS — K5900 Constipation, unspecified: Secondary | ICD-10-CM | POA: Diagnosis present

## 2016-10-05 DIAGNOSIS — Z3A13 13 weeks gestation of pregnancy: Secondary | ICD-10-CM | POA: Diagnosis not present

## 2016-10-05 LAB — URINALYSIS, ROUTINE W REFLEX MICROSCOPIC
BILIRUBIN URINE: NEGATIVE
Glucose, UA: NEGATIVE mg/dL
Hgb urine dipstick: NEGATIVE
Ketones, ur: NEGATIVE mg/dL
Leukocytes, UA: NEGATIVE
NITRITE: NEGATIVE
PROTEIN: NEGATIVE mg/dL
SPECIFIC GRAVITY, URINE: 1.015 (ref 1.005–1.030)
pH: 6 (ref 5.0–8.0)

## 2016-10-05 LAB — WET PREP, GENITAL
SPERM: NONE SEEN
TRICH WET PREP: NONE SEEN
YEAST WET PREP: NONE SEEN

## 2016-10-05 MED ORDER — FLEET ENEMA 7-19 GM/118ML RE ENEM
1.0000 | ENEMA | Freq: Once | RECTAL | Status: AC
  Administered 2016-10-05: 1 via RECTAL

## 2016-10-05 MED ORDER — METRONIDAZOLE 500 MG PO TABS: 500.0000 mg | ORAL_TABLET | Freq: Two times a day (BID) | ORAL | 0 refills | Status: DC

## 2016-10-05 NOTE — Discharge Instructions (Signed)
Constipation, Adult Constipation is when a person:  Poops (has a bowel movement) fewer times in a week than normal.  Has a hard time pooping.  Has poop that is dry, hard, or bigger than normal. Follow these instructions at home: Eating and drinking  Eat foods that have a lot of fiber, such as:  Fresh fruits and vegetables.  Whole grains.  Beans.  Eat less of foods that are high in fat, low in fiber, or overly processed, such as:  [place] fries.  Hamburgers.  Cookies.  Candy.  Soda.  Drink enough fluid to keep your pee (urine) clear or pale yellow. General instructions  Exercise regularly or as told by your doctor.  Go to the restroom when you feel like you need to poop. Do not hold it in.  Take over-the-counter and prescription medicines only as told by your doctor. These include any fiber supplements.  Do pelvic floor retraining exercises, such as:  Doing deep breathing while relaxing your lower belly (abdomen).  Relaxing your pelvic floor while pooping.  Watch your condition for any changes.  Keep all follow-up visits as told by your doctor. This is important. Contact a doctor if:  You have pain that gets worse.  You have a fever.  You have not pooped for 4 days.  You throw up (vomit).  You are not hungry.  You lose weight.  You are bleeding from the anus.  You have thin, pencil-like poop (stool). Get help right away if:  You have a fever, and your symptoms suddenly get worse.  You leak poop or have blood in your poop.  Your belly feels hard or bigger than normal (is bloated).  You have very bad belly pain.  You feel dizzy or you faint. This information is not intended to replace advice given to you by your health care provider. Make sure you discuss any questions you have with your health care provider. Document Released: 04/21/2008 Document Revised: 05/23/2016 Document Reviewed: 04/23/2016 Elsevier Interactive Patient Education  2017  Else[employer] Inc[employer]   Bacterial Vaginosis Bacterial vaginosis is an infection of the vagina. It happens when too many germs (bacteria) grow in the vagina. This infection puts you at risk for infections from sex (STIs). Treating this infection can lower your risk for some STIs. You should also treat this if you are pregnant. It can cause your baby to be born early. Follow these instructions at home: Medicines  Take over-the-counter and prescription medicines only as told by your doctor.  Take or use your antibiotic medicine as told by your doctor. Do not stop taking or using it even if you start to feel better. General instructions  If you your sexual partner is a woman, tell her that you have this infection. She needs to get treatment if she has symptoms. If you have a female partner, he does not need to be treated.  During treatment:  Avoid sex.  Do not douche.  Avoid alcohol as told.  Avoid breastfeeding as told.  Drink enough fluid to keep your pee (urine) clear or pale yellow.  Keep your vagina and butt (rectum) clean.  Wash the area with warm water every day.  Wipe from front to back after you use the toilet.  Keep all follow-up visits as told by your doctor. This is important. Preventing this condition  Do not douche.  Use only warm water to wash around your vagina.  Use protection when you have sex. This includes:  Latex condoms.  Dental  dams.  Limit how many people you have sex with. It is best to only have sex with the same person (be monogamous).  Get tested for STIs. Have your partner get tested.  Wear underwear that is cotton or lined with cotton.  Avoid tight pants and pantyhose. This is most important in summer.  Do not use any products that have nicotine or tobacco in them. These include cigarettes and e-cigarettes. If you need help quitting, ask your doctor.  Do not use illegal drugs.  Limit how much alcohol you drink. Contact a doctor if:  Your  symptoms do not get better, even after you are treated.  You have more discharge or pain when you pee (urinate).  You have a fever.  You have pain in your belly (abdomen).  You have pain with sex.  Your bleed from your vagina between periods. Summary  This infection happens when too many germs (bacteria) grow in the vagina.  Treating this condition can lower your risk for some infections from sex (STIs).  You should also treat this if you are pregnant. It can cause early (premature) birth.  Do not stop taking or using your antibiotic medicine even if you start to feel better. This information is not intended to replace advice given to you by your health care provider. Make sure you discuss any questions you have with your health care provider. Document Released: 08/12/2008 Document Revised: 07/19/2016 Document Reviewed: 07/19/2016 Elsevier Interactive Patient Education  2017 [employer].

## 2016-10-05 NOTE — MAU Note (Signed)
Having pink vag bleeding on tissue and some abd pain for about 3 hours.

## 2016-10-05 NOTE — Progress Notes (Signed)
Noni Saupe NP in earlier to discuss test results and d/c plan with pt. Written and verbal d/c instructions given and understanding voiced.

## 2016-10-05 NOTE — MAU Provider Note (Signed)
History     CSN: [id number]  Arrival date and time: 10/04/16 2346   First Provider Initiated Contact with Patient 10/05/16 0026      Chief Complaint  Patient presents with  . Vaginal Bleeding  . Abdominal Pain  . Dysuria   HPI   [name] is a 40 y.o. female G1P0 @ [redacted]w[redacted]d here in MAU with constipation, spotting and abdominal pain.  She attests to having problem with constipation for the entire length of her pregnancy. She has tried many different over the counter medications with only some relief. She has had to push and bare down for BM recently. After straining today she noticed some spotting on the tissue after she wiped. The spotting was pink in color and very light.   OB History    Gravida Para Term Preterm AB Living   1             SAB TAB Ectopic Multiple Live Births                  Past Medical History:  Diagnosis Date  . Medical history non-contributory     Past Surgical History:  Procedure Laterality Date  . NO PAST SURGERIES      History reviewed. No pertinent family history.  Social History  Substance Use Topics  . Smoking status: Never Smoker  . Smokeless tobacco: Never Used  . Alcohol use No    Allergies: No Known Allergies  Prescriptions Prior to Admission  Medication Sig Dispense Refill Last Dose  . Prenatal Vit-Fe Fumarate-FA (PRENATAL MULTIVITAMIN) TABS tablet Take 1 tablet by mouth daily at 12 noon.   Taking  . promethazine (PHENERGAN) 25 MG tablet Take 1 tablet (25 mg total) by mouth every 6 (six) hours as needed for nausea or vomiting. (Patient not taking: Reported on 09/24/2016) 20 tablet 0 Not Taking    Results for orders placed or performed during the hospital encounter of 10/04/16 (from the past 48 hour(s))  Urinalysis, Routine w reflex microscopic (not at [facility])     Status: None   Collection Time: 10/05/16 12:10 AM  Result Value Ref Range   Color, Urine YELLOW YELLOW   APPearance CLEAR CLEAR   Specific Gravity, Urine  1.015 1.005 - 1.030   pH 6.0 5.0 - 8.0   Glucose, UA NEGATIVE NEGATIVE mg/dL   Hgb urine dipstick NEGATIVE NEGATIVE   Bilirubin Urine NEGATIVE NEGATIVE   Ketones, ur NEGATIVE NEGATIVE mg/dL   Protein, ur NEGATIVE NEGATIVE mg/dL   Nitrite NEGATIVE NEGATIVE   Leukocytes, UA NEGATIVE NEGATIVE    Comment: MICROSCOPIC NOT DONE ON URINES WITH NEGATIVE PROTEIN, BLOOD, LEUKOCYTES, NITRITE, OR GLUCOSE <1000 mg/dL.  Wet prep, genital     Status: Abnormal   Collection Time: 10/05/16 12:35 AM  Result Value Ref Range   Yeast Wet Prep HPF POC NONE SEEN NONE SEEN   Trich, Wet Prep NONE SEEN NONE SEEN   Clue Cells Wet Prep HPF POC PRESENT (A) NONE SEEN   WBC, Wet Prep HPF POC MODERATE (A) NONE SEEN    Comment: MANY BACTERIA SEEN   Sperm NONE SEEN     Review of Systems  Constitutional: Negative for chills and fever.  Gastrointestinal: Positive for abdominal pain and constipation. Negative for nausea and vomiting.  Genitourinary: Positive for dysuria.   Physical Exam   Blood pressure (!) 111/47, pulse 87, temperature 97.3 F (36.3 C), resp. rate 18, height 4\' 11"  (1.499 m), weight 136 lb 3.2 oz (61.8 kg),  last menstrual period 07/01/2016.  Physical Exam  Constitutional: She is oriented to person, place, and time. She appears well-developed and well-nourished. No distress.  HENT:  Head: Normocephalic.  Eyes: Pupils are equal, round, and reactive to light.  GI: Soft. Normal appearance and bowel sounds are normal. She exhibits no distension and no mass. There is no tenderness. There is no rebound and no guarding.  Genitourinary:  Genitourinary Comments: Vagina - Small amount of pink vaginal discharge, + odor  Cervix - No contact bleeding, no active bleeding  Bimanual exam: Cervix closed Uterus non tender, enlarged  Adnexa non tender, no masses bilaterally GC/Chlam, wet prep done Chaperone present for exam.   Musculoskeletal: Normal range of motion.  Neurological: She is alert and oriented  to person, place, and time.  Skin: Skin is warm. She is not diaphoretic.  Psychiatric: Her behavior is normal.    MAU Course  Procedures  None  MDM  + fetal heart tones via doppler  A positive blood type  UA Wet prep & GC pending  Spotting likely secondary to friable cervix/ straining during BM.   Assessment and Plan   A:  1. Friable cervix   2. Constipation in pregnancy in first trimester   3. BV (bacterial vaginosis)     P:  Discharge home in stable condition Rx: Flagyl Enema given per patient request, for patient to use at home. At home instructions reviewed.  Discussed management of constipation in detail. Return to MAU if symptoms worsen    Lezlie Lye, NP 10/06/2016 8:10 AM

## 2016-10-06 LAB — GC/CHLAMYDIA PROBE AMP (~~LOC~~) NOT AT ARMC
CHLAMYDIA, DNA PROBE: NEGATIVE
Neisseria Gonorrhea: NEGATIVE

## 2016-10-20 ENCOUNTER — Telehealth: Payer: Self-pay

## 2016-10-20 NOTE — Telephone Encounter (Signed)
Received message on voicemail patient is wanting to know if she is having a boy or girl. I explained to patient we will not know that information until after her u/s on 11/05/2016. Patient verbalizes  Understanding at this time.

## 2016-11-05 ENCOUNTER — Ambulatory Visit (HOSPITAL_COMMUNITY)
Admission: RE | Admit: 2016-11-05 | Discharge: 2016-11-05 | Disposition: A | Discharge status: Home / Self Care | Payer: Medicaid Other | Source: Ambulatory Visit | Attending: Obstetrics & Gynecology | Admitting: Obstetrics & Gynecology

## 2016-11-05 ENCOUNTER — Encounter (HOSPITAL_COMMUNITY): Payer: Self-pay

## 2016-11-23 ENCOUNTER — Inpatient Hospital Stay (HOSPITAL_COMMUNITY)
Admission: AD | Admit: 2016-11-23 | Discharge: 2016-11-23 | Disposition: A | Discharge status: Home / Self Care | Payer: Medicaid Other | Source: Ambulatory Visit | Attending: Obstetrics & Gynecology | Admitting: Obstetrics & Gynecology

## 2016-11-23 ENCOUNTER — Encounter (HOSPITAL_COMMUNITY): Payer: Self-pay

## 2016-11-23 DIAGNOSIS — R102 Pelvic and perineal pain: Secondary | ICD-10-CM | POA: Diagnosis not present

## 2016-11-23 DIAGNOSIS — O26891 Other specified pregnancy related conditions, first trimester: Secondary | ICD-10-CM | POA: Diagnosis not present

## 2016-11-23 DIAGNOSIS — N949 Unspecified condition associated with female genital organs and menstrual cycle: Secondary | ICD-10-CM

## 2016-11-23 DIAGNOSIS — Z3A20 20 weeks gestation of pregnancy: Secondary | ICD-10-CM | POA: Insufficient documentation

## 2016-11-23 DIAGNOSIS — O26892 Other specified pregnancy related conditions, second trimester: Secondary | ICD-10-CM | POA: Diagnosis not present

## 2016-11-23 LAB — URINALYSIS, ROUTINE W REFLEX MICROSCOPIC
Bilirubin Urine: NEGATIVE
GLUCOSE, UA: NEGATIVE mg/dL
Hgb urine dipstick: NEGATIVE
KETONES UR: NEGATIVE mg/dL
LEUKOCYTES UA: NEGATIVE
NITRITE: NEGATIVE
PROTEIN: NEGATIVE mg/dL
Specific Gravity, Urine: 1.011 (ref 1.005–1.030)
pH: 7 (ref 5.0–8.0)

## 2016-11-23 NOTE — MAU Note (Signed)
Patient presents with bilateral side and lower abdominal pain, denies bleeding

## 2016-11-23 NOTE — Discharge Instructions (Signed)
Round Ligament Pain Introduction The round ligament is a cord of muscle and tissue that helps to support the uterus. It can become a source of pain during pregnancy if it becomes stretched or twisted as the baby grows. The pain usually begins in the second trimester of pregnancy, and it can come and go until the baby is delivered. It is not a serious problem, and it does not cause harm to the baby. Round ligament pain is usually a short, sharp, and pinching pain, but it can also be a dull, lingering, and aching pain. The pain is felt in the lower side of the abdomen or in the groin. It usually starts deep in the groin and moves up to the outside of the hip area. Pain can occur with:  A sudden change in position.  Rolling over in bed.  Coughing or sneezing.  Physical activity. Follow these instructions at home: Watch your condition for any changes. Take these steps to help with your pain:  When the pain starts, relax. Then try:  Sitting down.  Flexing your knees up to your abdomen.  Lying on your side with one pillow under your abdomen and another pillow between your legs.  Sitting in a warm bath for 15-20 minutes or until the pain goes away.  Take over-the-counter and prescription medicines only as told by your health care provider.  Move slowly when you sit and stand.  Avoid long walks if they cause pain.  Stop or lessen your physical activities if they cause pain. Contact a health care provider if:  Your pain does not go away with treatment.  You feel pain in your back that you did not have before.  Your medicine is not helping. Get help right away if:  You develop a fever or chills.  You develop uterine contractions.  You develop vaginal bleeding.  You develop nausea or vomiting.  You develop diarrhea.  You have pain when you urinate. This information is not intended to replace advice given to you by your health care provider. Make sure you discuss any questions  you have with your health care provider. Document Released: 08/12/2008 Document Revised: 04/10/2016 Document Reviewed: 01/10/2015  2017 Elsevier  

## 2016-11-23 NOTE — MAU Provider Note (Signed)
History   G1 @ 20.5 wks  AMA @ 41 yo in with c/o low abd pain bilaterially that is intermittant and dull in nature. It has been going on for 3-4 days. Denies Vag bleeding or ROM.  CSN: [id number]  Arrival date & time 11/23/16  1521   None     Chief Complaint  Patient presents with  . Abdominal Pain    HPI  Past Medical History:  Diagnosis Date  . Medical history non-contributory     Past Surgical History:  Procedure Laterality Date  . NO PAST SURGERIES      History reviewed. No pertinent family history.  Social History  Substance Use Topics  . Smoking status: Never Smoker  . Smokeless tobacco: Never Used  . Alcohol use No    OB History    Gravida Para Term Preterm AB Living   1             SAB TAB Ectopic Multiple Live Births                  Review of Systems  Constitutional: Negative.   HENT: Negative.   Eyes: Negative.   Respiratory: Negative.   Cardiovascular: Negative.   Gastrointestinal: Positive for abdominal pain.  Endocrine: Negative.   Genitourinary: Negative.   Musculoskeletal: Negative.   Skin: Negative.   Allergic/Immunologic: Negative.   Neurological: Negative.   Hematological: Negative.   Psychiatric/Behavioral: Negative.     Allergies  Patient has no known allergies.  Home Medications    BP 126/60 (BP Location: Right Arm)   Pulse 79   Temp 97.7 F (36.5 C) (Oral)   Resp 18   Ht 5' (1.524 m)   Wt 147 lb 1.3 oz (66.7 kg)   LMP 07/01/2016   BMI 28.72 kg/m   Physical Exam  Constitutional: She is oriented to person, place, and time. She appears well-developed and well-nourished.  HENT:  Head: Normocephalic.  Eyes: Pupils are equal, round, and reactive to light.  Neck: Normal range of motion.  Cardiovascular: Normal rate, regular rhythm, normal heart sounds and intact distal pulses.   Pulmonary/Chest: Effort normal and breath sounds normal.  Abdominal: Soft. Bowel sounds are normal.  Genitourinary: Vagina normal and uterus  normal.  Musculoskeletal: Normal range of motion.  Neurological: She is alert and oriented to person, place, and time. She has normal reflexes.  Skin: Skin is warm and dry.  Psychiatric: She has a normal mood and affect. Her behavior is normal. Judgment and thought content normal.    MAU Course  Procedures (including critical care time)  Labs Reviewed  URINALYSIS, ROUTINE W REFLEX MICROSCOPIC   No results found.   1. Round ligament pain       MDM  SVE firm/cl/post/high. FHR sta and reg by doppler. ua WNL.  Discussed comfort measures with pt and husb. Will d/c home

## 2017-02-24 ENCOUNTER — Inpatient Hospital Stay (HOSPITAL_COMMUNITY)
Admission: AD | Admit: 2017-02-24 | Discharge: 2017-02-24 | Disposition: A | Discharge status: Home / Self Care | Payer: Medicaid Other | Source: Ambulatory Visit | Attending: Family Medicine | Admitting: Family Medicine

## 2017-02-24 ENCOUNTER — Encounter (HOSPITAL_COMMUNITY): Payer: Self-pay | Admitting: *Deleted

## 2017-02-24 DIAGNOSIS — O99353 Diseases of the nervous system complicating pregnancy, third trimester: Secondary | ICD-10-CM | POA: Diagnosis not present

## 2017-02-24 DIAGNOSIS — O09511 Supervision of elderly primigravida, first trimester: Secondary | ICD-10-CM

## 2017-02-24 DIAGNOSIS — Z3689 Encounter for other specified antenatal screening: Secondary | ICD-10-CM | POA: Diagnosis not present

## 2017-02-24 DIAGNOSIS — O368130 Decreased fetal movements, third trimester, not applicable or unspecified: Secondary | ICD-10-CM | POA: Diagnosis not present

## 2017-02-24 DIAGNOSIS — G5600 Carpal tunnel syndrome, unspecified upper limb: Secondary | ICD-10-CM

## 2017-02-24 DIAGNOSIS — G5603 Carpal tunnel syndrome, bilateral upper limbs: Secondary | ICD-10-CM | POA: Insufficient documentation

## 2017-02-24 DIAGNOSIS — O09513 Supervision of elderly primigravida, third trimester: Secondary | ICD-10-CM | POA: Insufficient documentation

## 2017-02-24 DIAGNOSIS — Z3A34 34 weeks gestation of pregnancy: Secondary | ICD-10-CM | POA: Insufficient documentation

## 2017-02-24 DIAGNOSIS — M79641 Pain in right hand: Secondary | ICD-10-CM | POA: Diagnosis present

## 2017-02-24 DIAGNOSIS — O26899 Other specified pregnancy related conditions, unspecified trimester: Secondary | ICD-10-CM | POA: Diagnosis not present

## 2017-02-24 LAB — URINALYSIS, ROUTINE W REFLEX MICROSCOPIC
Bilirubin Urine: NEGATIVE
GLUCOSE, UA: NEGATIVE mg/dL
Hgb urine dipstick: NEGATIVE
Ketones, ur: NEGATIVE mg/dL
LEUKOCYTES UA: NEGATIVE
Nitrite: NEGATIVE
PH: 5.5 (ref 5.0–8.0)
Protein, ur: NEGATIVE mg/dL
Specific Gravity, Urine: <1.005 — ABNORMAL LOW (ref 1.005–1.030)

## 2017-02-24 NOTE — MAU Note (Signed)
PT  SAYS HER HANDS  HAVE   HURT X1-2 WEEKS-  ALL  UP  TO  ELBOW- ON RIGHT  ARM -    CAN'T FEEL IN FINGER TIPS /    BOTH HANDS -  BUT  RIGHT  WORSE.      PNC-  HD- ALL OK.        LAST MOVEMENT  OF BABY TODAY  3PM- [facility]- IN TRIAGE   - 137

## 2017-02-24 NOTE — MAU Provider Note (Signed)
History     CSN: [id number]  Arrival date and time: 02/24/17 2127   First Provider Initiated Contact with Patient 02/24/17 2208      Chief Complaint  Patient presents with  . Hand Pain   G1 @34  weeks here with bilateral hand pain. Sx started 1 week ago. She reports pain is worst in the second fingers on both hands, R worse than L. She also experiences numbness and tingling at times. She has difficulty first thing in the am with opening and closing right hand. Denies injury to hands or fingers. She also reports decreased FM today. She's eating and drinking well.    OB History    Gravida Para Term Preterm AB Living   1             SAB TAB Ectopic Multiple Live Births                  Past Medical History:  Diagnosis Date  . Medical history non-contributory     Past Surgical History:  Procedure Laterality Date  . NO PAST SURGERIES      History reviewed. No pertinent family history.  Social History  Substance Use Topics  . Smoking status: Never Smoker  . Smokeless tobacco: Never Used  . Alcohol use No    Allergies: No Known Allergies  Prescriptions Prior to Admission  Medication Sig Dispense Refill Last Dose  . acetaminophen (TYLENOL) 325 MG tablet Take 650 mg by mouth every 6 (six) hours as needed for mild pain or headache.   Past Month at Unknown time  . Prenatal Vit-Fe Fumarate-FA (PRENATAL MULTIVITAMIN) TABS tablet Take 1 tablet by mouth daily at 12 noon.    02/24/2017 at Unknown time  . metroNIDAZOLE (FLAGYL) 500 MG tablet Take 1 tablet (500 mg total) by mouth 2 (two) times daily. (Patient not taking: Reported on 11/23/2016) 14 tablet 0 Completed Course at Unknown time  . promethazine (PHENERGAN) 25 MG tablet Take 1 tablet (25 mg total) by mouth every 6 (six) hours as needed for nausea or vomiting. (Patient not taking: Reported on 11/23/2016) 20 tablet 0 Not Taking at Unknown time    Review of Systems  Respiratory: Positive for shortness of breath (intermittent,  with lying down).   Cardiovascular: Negative for chest pain.  Gastrointestinal: Positive for constipation. Negative for abdominal pain.  Genitourinary: Negative for vaginal bleeding and vaginal discharge.  Neurological: Positive for weakness (fingers) and numbness (fingers). Negative for syncope, facial asymmetry and speech difficulty.   Physical Exam   Blood pressure 112/64, pulse 87, temperature 97.8 F (36.6 C), temperature source Oral, resp. rate 20, height 5' 0.5" (1.537 m), weight 75.9 kg (167 lb 4 oz), last menstrual period 07/01/2016.  Physical Exam  Constitutional: She is oriented to person, place, and time. She appears well-developed and well-nourished. No distress.  HENT:  Head: Normocephalic and atraumatic.  Neck: Normal range of motion.  Cardiovascular: Normal rate.   Respiratory: Effort normal.  GI: Soft. She exhibits no distension. There is no tenderness.  gravid  Musculoskeletal:       Right hand: She exhibits normal range of motion, no tenderness, no bony tenderness, normal capillary refill, no deformity and no swelling. Decreased strength noted. She exhibits finger abduction.       Left hand: She exhibits normal range of motion, no tenderness, no bony tenderness, normal capillary refill, no deformity and no swelling. Decreased strength noted. She exhibits finger abduction.  Neurological: She is alert and oriented to  person, place, and time. No sensory deficit.  Skin: Skin is warm and dry.  Psychiatric: She has a normal mood and affect.   EFM: 135 bpm, mod variability, + accels, no decels Toco: none  MAU Course  Procedures  MDM Reports good FM since EFM applied. Will treat for carpal tunnel. Recommend bilateral wrist splints qhs and Tylenol prn. Stable for discharge home.  Assessment and Plan   1. [redacted] weeks gestation of pregnancy   2. Decreased fetal movements in third trimester, single or unspecified fetus   3. Carpal tunnel syndrome during pregnancy   4. NST  (non-stress test) reactive    Discharge home Follow up at [facility] as scheduled Bilateral wrist splints Tylenol prn  Allergies as of 02/24/2017   No Known Allergies     Medication List    STOP taking these medications   metroNIDAZOLE 500 MG tablet Commonly known as:  FLAGYL   promethazine 25 MG tablet Commonly known as:  PHENERGAN     TAKE these medications   acetaminophen 325 MG tablet Commonly known as:  TYLENOL Take 650 mg by mouth every 6 (six) hours as needed for mild pain or headache.   prenatal multivitamin Tabs tablet Take 1 tablet by mouth daily at 12 noon.      Julianne Handler, CNM 02/24/2017, 10:21 PM

## 2017-02-24 NOTE — Discharge Instructions (Signed)
Carpal Tunnel Syndrome Carpal tunnel syndrome is a condition that causes pain in your hand and arm. The carpal tunnel is a narrow area that is on the palm side of your wrist. Repeated wrist motion or certain diseases may cause swelling in the tunnel. This swelling can pinch the main nerve in the wrist (median nerve). Follow these instructions at home: If you have a splint:   Wear it as told by your doctor. Remove it only as told by your doctor.  Loosen the splint if your fingers:  Become numb and tingle.  Turn blue and cold.  Keep the splint clean and dry. General instructions   Take over-the-counter and prescription medicines only as told by your doctor.  Rest your wrist from any activity that may be causing your pain. If needed, talk to your employer about changes that can be made in your work, such as getting a wrist pad to use while typing.  If directed, apply ice to the painful area:  Put ice in a plastic bag.  Place a towel between your skin and the bag.  Leave the ice on for 20 minutes, 2-3 times per day.  Keep all follow-up visits as told by your doctor. This is important.  Do any exercises as told by your doctor, physical therapist, or occupational therapist. Contact a doctor if:  You have new symptoms.  Medicine does not help your pain.  Your symptoms get worse. This information is not intended to replace advice given to you by your health care provider. Make sure you discuss any questions you have with your health care provider. Document Released: 10/23/2011 Document Revised: 04/10/2016 Document Reviewed: 03/21/2015 Elsevier Interactive Patient Education  2017 [place]. Fetal Movement Counts Patient Name: ________________________________________________ Patient Due Date: ____________________ What is a fetal movement count? A fetal movement count is the number of times that you feel your baby move during a certain amount of time. This may also be called a  fetal kick count. A fetal movement count is recommended for every pregnant woman. You may be asked to start counting fetal movements as early as week 28 of your pregnancy. Pay attention to when your baby is most active. You may notice your baby's sleep and wake cycles. You may also notice things that make your baby move more. You should do a fetal movement count:  When your baby is normally most active.  At the same time each day. A good time to count movements is while you are resting, after having something to eat and drink. How do I count fetal movements? 1. Find a quiet, comfortable area. Sit, or lie down on your side. 2. Write down the date, the start time and stop time, and the number of movements that you felt between those two times. Take this information with you to your health care visits. 3. For 2 hours, count kicks, flutters, swishes, rolls, and jabs. You should feel at least 10 movements during 2 hours. 4. You may stop counting after you have felt 10 movements. 5. If you do not feel 10 movements in 2 hours, have something to eat and drink. Then, keep resting and counting for 1 hour. If you feel at least 4 movements during that hour, you may stop counting. Contact a health care provider if:  You feel fewer than 4 movements in 2 hours.  Your baby is not moving like he or she usually does. Date: ____________ Start time: ____________ Stop time: ____________ Movements: ____________ Date: ____________ Start  time: ____________ Stop time: ____________ Movements: ____________ Date: ____________ Start time: ____________ Stop time: ____________ Movements: ____________ Date: ____________ Start time: ____________ Stop time: ____________ Movements: ____________ Date: ____________ Start time: ____________ Stop time: ____________ Movements: ____________ Date: ____________ Start time: ____________ Stop time: ____________ Movements: ____________ Date: ____________ Start time: ____________ Stop  time: ____________ Movements: ____________ Date: ____________ Start time: ____________ Stop time: ____________ Movements: ____________ Date: ____________ Start time: ____________ Stop time: ____________ Movements: ____________ This information is not intended to replace advice given to you by your health care provider. Make sure you discuss any questions you have with your health care provider. Document Released: 12/03/2006 Document Revised: 07/02/2016 Document Reviewed: 12/13/2015 Elsevier Interactive Patient Education  2017 [employer].

## 2017-03-09 LAB — OB RESULTS CONSOLE GBS: GBS: POSITIVE

## 2017-03-09 LAB — OB RESULTS CONSOLE GC/CHLAMYDIA
Chlamydia: NEGATIVE
GC PROBE AMP, GENITAL: NEGATIVE

## 2017-03-30 ENCOUNTER — Other Ambulatory Visit (HOSPITAL_COMMUNITY): Payer: Self-pay | Admitting: Nurse Practitioner

## 2017-03-30 DIAGNOSIS — O09523 Supervision of elderly multigravida, third trimester: Secondary | ICD-10-CM

## 2017-03-31 ENCOUNTER — Telehealth (HOSPITAL_COMMUNITY): Payer: Self-pay | Admitting: *Deleted

## 2017-03-31 NOTE — Telephone Encounter (Signed)
Preadmission screen  

## 2017-04-02 ENCOUNTER — Encounter (HOSPITAL_COMMUNITY): Payer: Self-pay

## 2017-04-02 ENCOUNTER — Ambulatory Visit (HOSPITAL_COMMUNITY): Payer: Medicaid Other

## 2017-04-02 ENCOUNTER — Other Ambulatory Visit: Payer: Self-pay | Admitting: Advanced Practice Midwife

## 2017-04-05 ENCOUNTER — Inpatient Hospital Stay (HOSPITAL_COMMUNITY)
Admission: RE | Admit: 2017-04-05 | Discharge: 2017-04-09 | DRG: 766 | Disposition: A | Discharge status: Home / Self Care | Payer: Medicaid Other | Source: Ambulatory Visit | Attending: Obstetrics & Gynecology | Admitting: Obstetrics & Gynecology

## 2017-04-05 ENCOUNTER — Encounter (HOSPITAL_COMMUNITY): Payer: Self-pay

## 2017-04-05 DIAGNOSIS — Z98891 History of uterine scar from previous surgery: Secondary | ICD-10-CM

## 2017-04-05 DIAGNOSIS — O99824 Streptococcus B carrier state complicating childbirth: Secondary | ICD-10-CM | POA: Diagnosis not present

## 2017-04-05 DIAGNOSIS — Z3493 Encounter for supervision of normal pregnancy, unspecified, third trimester: Secondary | ICD-10-CM | POA: Diagnosis present

## 2017-04-05 DIAGNOSIS — Z3A39 39 weeks gestation of pregnancy: Secondary | ICD-10-CM | POA: Diagnosis not present

## 2017-04-05 DIAGNOSIS — O09511 Supervision of elderly primigravida, first trimester: Secondary | ICD-10-CM | POA: Diagnosis present

## 2017-04-05 LAB — CBC
HEMATOCRIT: 37.4 % (ref 36.0–46.0)
HEMOGLOBIN: 12.3 g/dL (ref 12.0–15.0)
MCH: 26.9 pg (ref 26.0–34.0)
MCHC: 32.9 g/dL (ref 30.0–36.0)
MCV: 81.7 fL (ref 78.0–100.0)
Platelets: 201 10*3/uL (ref 150–400)
RBC: 4.58 MIL/uL (ref 3.87–5.11)
RDW: 14.2 % (ref 11.5–15.5)
WBC: 8.8 10*3/uL (ref 4.0–10.5)

## 2017-04-05 LAB — TYPE AND SCREEN
ABO/RH(D): A POS
Antibody Screen: NEGATIVE

## 2017-04-05 MED ORDER — PENICILLIN G POT IN DEXTROSE 60000 UNIT/ML IV SOLN
3.0000 10*6.[IU] | INTRAVENOUS | Status: DC
  Administered 2017-04-06 (×2): 3 10*6.[IU] via INTRAVENOUS
  Filled 2017-04-05 (×7): qty 50

## 2017-04-05 MED ORDER — EPHEDRINE 5 MG/ML INJ: 10.0000 mg | INTRAVENOUS | Status: DC | PRN

## 2017-04-05 MED ORDER — DIPHENHYDRAMINE HCL 50 MG/ML IJ SOLN: 12.5000 mg | INTRAMUSCULAR | Status: DC | PRN

## 2017-04-05 MED ORDER — LIDOCAINE HCL (PF) 1 % IJ SOLN: 30.0000 mL | INTRAMUSCULAR | Status: DC | PRN

## 2017-04-05 MED ORDER — OXYTOCIN BOLUS FROM INFUSION: 500.0000 mL | Freq: Once | INTRAVENOUS | Status: DC

## 2017-04-05 MED ORDER — MISOPROSTOL 25 MCG QUARTER TABLET
25.0000 ug | ORAL_TABLET | ORAL | Status: DC | PRN
  Administered 2017-04-05: 25 ug via VAGINAL
  Filled 2017-04-05: qty 1

## 2017-04-05 MED ORDER — ACETAMINOPHEN 325 MG PO TABS: 650.0000 mg | ORAL_TABLET | ORAL | Status: DC | PRN

## 2017-04-05 MED ORDER — FENTANYL 2.5 MCG/ML BUPIVACAINE 1/10 % EPIDURAL INFUSION (WH - ANES)
14.0000 mL/h | INTRAMUSCULAR | Status: DC | PRN
  Administered 2017-04-06: 14 mL/h via EPIDURAL
  Filled 2017-04-05 (×2): qty 100

## 2017-04-05 MED ORDER — NALBUPHINE HCL 10 MG/ML IJ SOLN
5.0000 mg | INTRAMUSCULAR | Status: DC | PRN
  Administered 2017-04-05 (×2): 5 mg via INTRAVENOUS
  Filled 2017-04-05 (×2): qty 1

## 2017-04-05 MED ORDER — DEXTROSE 5 % IV SOLN
5.0000 10*6.[IU] | Freq: Once | INTRAVENOUS | Status: AC
  Administered 2017-04-06: 5 10*6.[IU] via INTRAVENOUS
  Filled 2017-04-05 (×2): qty 5

## 2017-04-05 MED ORDER — SOD CITRATE-CITRIC ACID 500-334 MG/5ML PO SOLN
30.0000 mL | ORAL | Status: DC | PRN
  Administered 2017-04-06: 30 mL via ORAL
  Filled 2017-04-05: qty 15

## 2017-04-05 MED ORDER — OXYCODONE-ACETAMINOPHEN 5-325 MG PO TABS: 2.0000 | ORAL_TABLET | ORAL | Status: DC | PRN

## 2017-04-05 MED ORDER — ONDANSETRON HCL 4 MG/2ML IJ SOLN
4.0000 mg | Freq: Four times a day (QID) | INTRAMUSCULAR | Status: DC | PRN
  Administered 2017-04-06: 4 mg via INTRAVENOUS

## 2017-04-05 MED ORDER — PROMETHAZINE HCL 25 MG/ML IJ SOLN
12.5000 mg | Freq: Four times a day (QID) | INTRAMUSCULAR | Status: DC | PRN
  Administered 2017-04-05: 12.5 mg via INTRAVENOUS
  Filled 2017-04-05: qty 1

## 2017-04-05 MED ORDER — LACTATED RINGERS IV SOLN: 500.0000 mL | Freq: Once | INTRAVENOUS | Status: DC

## 2017-04-05 MED ORDER — OXYTOCIN 40 UNITS IN LACTATED RINGERS INFUSION - SIMPLE MED: 2.5000 [IU]/h | INTRAVENOUS | Status: DC

## 2017-04-05 MED ORDER — FENTANYL CITRATE (PF) 100 MCG/2ML IJ SOLN
100.0000 ug | INTRAMUSCULAR | Status: DC | PRN
  Administered 2017-04-05 (×2): 100 ug via INTRAVENOUS
  Filled 2017-04-05 (×2): qty 2

## 2017-04-05 MED ORDER — LACTATED RINGERS IV SOLN
500.0000 mL | INTRAVENOUS | Status: DC | PRN
  Administered 2017-04-06 (×2): 1000 mL via INTRAVENOUS

## 2017-04-05 MED ORDER — OXYTOCIN 40 UNITS IN LACTATED RINGERS INFUSION - SIMPLE MED
1.0000 m[IU]/min | INTRAVENOUS | Status: DC
  Administered 2017-04-06: 2 m[IU]/min via INTRAVENOUS
  Filled 2017-04-05: qty 1000

## 2017-04-05 MED ORDER — OXYCODONE-ACETAMINOPHEN 5-325 MG PO TABS: 1.0000 | ORAL_TABLET | ORAL | Status: DC | PRN

## 2017-04-05 MED ORDER — PHENYLEPHRINE 40 MCG/ML (10ML) SYRINGE FOR IV PUSH (FOR BLOOD PRESSURE SUPPORT)
80.0000 ug | PREFILLED_SYRINGE | INTRAVENOUS | Status: DC | PRN
  Filled 2017-04-05: qty 10

## 2017-04-05 MED ORDER — PHENYLEPHRINE 40 MCG/ML (10ML) SYRINGE FOR IV PUSH (FOR BLOOD PRESSURE SUPPORT): 80.0000 ug | PREFILLED_SYRINGE | INTRAVENOUS | Status: DC | PRN

## 2017-04-05 MED ORDER — LACTATED RINGERS IV SOLN
INTRAVENOUS | Status: DC
  Administered 2017-04-05 – 2017-04-06 (×3): via INTRAVENOUS

## 2017-04-05 MED ORDER — TERBUTALINE SULFATE 1 MG/ML IJ SOLN: 0.2500 mg | Freq: Once | INTRAMUSCULAR | Status: DC | PRN

## 2017-04-05 MED ORDER — MISOPROSTOL 25 MCG QUARTER TABLET
25.0000 ug | ORAL_TABLET | ORAL | Status: DC | PRN
  Administered 2017-04-05 (×2): 25 ug via ORAL
  Filled 2017-04-05 (×2): qty 1

## 2017-04-05 NOTE — Progress Notes (Signed)
Wanda Garcia is a 41 y.o. G1P0 at [redacted]w[redacted]d  admitted for induction of labor due to [facility].  Subjective: Patient tearful, reporting more intense contractions.  Objective: Vitals:   04/05/17 0944 04/05/17 1308 04/05/17 1554 04/05/17 1948  BP:  118/68 114/61 (!) 149/79  Pulse:  78 79 74  Resp:  16  18  Temp:    97.9 F (36.6 C)  TempSrc:    Axillary  Weight: 166 lb (75.3 kg)     Height: 5\' 1"  (1.549 m)      No intake/output data recorded.  FHT:  FHR: 135 bpm, variability: moderate,  accelerations:  Present,  decelerations:  Present variable UC:   regular, every 2-3 minutes SVE:   Dilation: Closed Exam by:: Len Blalock, S-NM  Labs: Lab Results  Component Value Date   WBC 8.8 04/05/2017   HGB 12.3 04/05/2017   HCT 37.4 04/05/2017   MCV 81.7 04/05/2017   PLT 201 04/05/2017    Assessment / Plan: IUP at term. IOL for AMA. GBS pos  Called to bedside for FHR decelerations. Fluid bolus given, FHR recovered. Cervix unchanged. Try IV nubain and phenergan for pain.   Plan: Recheck in 2-3 hours or sooner if patient feels more pressure.   Len Blalock SNM 04/05/2017, 9:25 PM

## 2017-04-05 NOTE — Progress Notes (Signed)
Wanda Garcia is a 41 y.o. G1P0 at [redacted]w[redacted]d by LMP admitted for induction of labor due to [facility].  Subjective:   Objective: BP 119/70   Pulse 80   Temp 98.6 F (37 C) (Oral)   Resp 16   Ht 5\' 1"  (1.549 m)   Wt 166 lb (75.3 kg)   LMP 07/01/2016   BMI 31.37 kg/m  No intake/output data recorded. No intake/output data recorded.  FHT:  FHR: 120 bpm, variability: moderate,  accelerations:  Present,  decelerations:  Absent UC:   regular, every 2 minutes mils intensity SVE:   Dilation: Closed Exam by:: lee  Labs: Lab Results  Component Value Date   WBC 8.8 04/05/2017   HGB 12.3 04/05/2017   HCT 37.4 04/05/2017   MCV 81.7 04/05/2017   PLT 201 04/05/2017    Assessment / Plan: Induction of labor due to [place],  progressing well on pitocin  Labor: yet to be in laboe Preeclampsia:  no signs or symptoms of toxicity Fetal Wellbeing:  Category I Pain Control:  Labor support without medications I/D:  n/a Anticipated MOD:  NSVD  Wanda Garcia 04/05/2017, 2:20 PM

## 2017-04-05 NOTE — Progress Notes (Signed)
Marinell Rotundo is a 41 y.o. G1P0 at [redacted]w[redacted]d by ultrasound admitted for induction of labor due to [facility].  Subjective:   Objective: BP 114/61   Pulse 79   Temp 98.6 F (37 C) (Oral)   Resp 16   Ht 5\' 1"  (1.549 m)   Wt 166 lb (75.3 kg)   LMP 07/01/2016   BMI 31.37 kg/m  No intake/output data recorded. No intake/output data recorded.  FHT:  FHR: 145-150 bpm, variability: moderate,  accelerations:  Present,  decelerations:  Absent UC:   regular, every 2-4 minutes and mild SVE:   Dilation: Fingertip Exam by:: lee  Labs: Lab Results  Component Value Date   WBC 8.8 04/05/2017   HGB 12.3 04/05/2017   HCT 37.4 04/05/2017   MCV 81.7 04/05/2017   PLT 201 04/05/2017    Assessment / Plan: Induction of labor due to [place],  progressing well on pitocin  Labor: Progressing normally Preeclampsia:  no signs or symptoms of toxicity Fetal Wellbeing:  Category I Pain Control:  IV pain meds I/D:  n/a Anticipated MOD:  NSVD  Koren Shiver 04/05/2017, 6:05 PM

## 2017-04-05 NOTE — H&P (Signed)
Wanda Garcia is a 41 y.o. female presenting for IOL for AMA. OB History    Gravida Para Term Preterm AB Living   1             SAB TAB Ectopic Multiple Live Births                 Past Medical History:  Diagnosis Date  . Medical history non-contributory    Past Surgical History:  Procedure Laterality Date  . NO PAST SURGERIES     Family History: family history is not on file. Social History:  reports that she has never smoked. She has never used smokeless tobacco. She reports that she does not drink alcohol or use drugs.     Maternal Diabetes: No Genetic Screening: Normal Maternal Ultrasounds/Referrals: Normal Fetal Ultrasounds or other Referrals:  None Maternal Substance Abuse:  No Significant Maternal Medications:  None Significant Maternal Lab Results:  None Other Comments:  None  Review of Systems  Constitutional: Negative.   HENT: Negative.   Eyes: Negative.   Respiratory: Negative.   Cardiovascular: Negative.   Gastrointestinal: Negative.   Genitourinary: Negative.   Musculoskeletal: Negative.   Skin: Negative.   Neurological: Negative.   Endo/Heme/Allergies: Negative.   Psychiatric/Behavioral: Negative.    Maternal Medical History:  Reason for admission: IOL for AMA  Contractions: none  Fetal activity: Perceived fetal activity is normal.    Prenatal Complications - Diabetes: AMA  Dilation: Closed Exam by:: darlene lawson,cnm Blood pressure 119/70, pulse 80, temperature 98.6 F (37 C), temperature source Oral, resp. rate 16, height 5\' 1"  (1.549 m), weight 166 lb (75.3 kg), last menstrual period 07/01/2016. Maternal Exam:  Uterine Assessment: none  Abdomen: Patient reports no abdominal tenderness. Fetal presentation: vertex  Introitus: Normal vulva. Normal vagina.  Pelvis: adequate for delivery.   Cervix: Cervix evaluated by digital exam.     Fetal Exam Fetal Monitor Review: Mode: ultrasound.   Variability: moderate (6-25 bpm).   Pattern:  accelerations present.    Fetal State Assessment: Category I - tracings are normal.     Physical Exam  Constitutional: She is oriented to person, place, and time. She appears well-developed and well-nourished.  HENT:  Head: Normocephalic.  Eyes: Pupils are equal, round, and reactive to light.  Cardiovascular: Normal rate, regular rhythm, normal heart sounds and intact distal pulses.   Respiratory: Effort normal and breath sounds normal.  GI: Soft. Bowel sounds are normal.  Genitourinary: Vagina normal and uterus normal.  Musculoskeletal: Normal range of motion.  Neurological: She is alert and oriented to person, place, and time. She has normal reflexes.  Skin: Skin is warm and dry.  Psychiatric: She has a normal mood and affect. Her behavior is normal. Judgment and thought content normal.    Prenatal labs: ABO, Rh: A/Positive/-- (10/26 0000) Antibody: Negative (10/26 0000) Rubella: Immune (10/26 0000) RPR: Nonreactive (10/26 0000)  HBsAg: Negative (10/26 0000)  HIV: Non-reactive (10/26 0000)  GBS: Positive (04/23 0000)   Assessment/Plan: cytotec induction of labor   Koren Shiver 04/05/2017, 10:02 AM

## 2017-04-06 ENCOUNTER — Encounter (HOSPITAL_COMMUNITY)
Admission: RE | Disposition: A | Discharge status: Home / Self Care | Payer: Self-pay | Source: Ambulatory Visit | Attending: Obstetrics & Gynecology

## 2017-04-06 ENCOUNTER — Encounter (HOSPITAL_COMMUNITY): Payer: Self-pay

## 2017-04-06 ENCOUNTER — Inpatient Hospital Stay (HOSPITAL_COMMUNITY): Payer: Medicaid Other | Admitting: Anesthesiology

## 2017-04-06 DIAGNOSIS — Z3A39 39 weeks gestation of pregnancy: Secondary | ICD-10-CM

## 2017-04-06 DIAGNOSIS — O99824 Streptococcus B carrier state complicating childbirth: Secondary | ICD-10-CM

## 2017-04-06 LAB — RPR: RPR Ser Ql: NONREACTIVE

## 2017-04-06 SURGERY — Surgical Case
Anesthesia: Epidural

## 2017-04-06 MED ORDER — ONDANSETRON HCL 4 MG/2ML IJ SOLN
INTRAMUSCULAR | Status: AC
  Filled 2017-04-06: qty 2

## 2017-04-06 MED ORDER — FERROUS SULFATE 325 (65 FE) MG PO TABS
325.0000 mg | ORAL_TABLET | Freq: Two times a day (BID) | ORAL | Status: DC
  Administered 2017-04-07 – 2017-04-09 (×3): 325 mg via ORAL
  Filled 2017-04-06 (×3): qty 1

## 2017-04-06 MED ORDER — OXYTOCIN 40 UNITS IN LACTATED RINGERS INFUSION - SIMPLE MED: 2.5000 [IU]/h | INTRAVENOUS | Status: AC

## 2017-04-06 MED ORDER — MEPERIDINE HCL 25 MG/ML IJ SOLN: 6.2500 mg | INTRAMUSCULAR | Status: DC | PRN

## 2017-04-06 MED ORDER — BUPIVACAINE HCL (PF) 0.5 % IJ SOLN
INTRAMUSCULAR | Status: AC
  Filled 2017-04-06: qty 30

## 2017-04-06 MED ORDER — OXYCODONE HCL 5 MG PO TABS: 5.0000 mg | ORAL_TABLET | Freq: Once | ORAL | Status: DC | PRN

## 2017-04-06 MED ORDER — TETANUS-DIPHTH-ACELL PERTUSSIS 5-2.5-18.5 LF-MCG/0.5 IM SUSP: 0.5000 mL | Freq: Once | INTRAMUSCULAR | Status: DC

## 2017-04-06 MED ORDER — LIDOCAINE HCL (PF) 1 % IJ SOLN
INTRAMUSCULAR | Status: DC | PRN
  Administered 2017-04-06: 4 mL
  Administered 2017-04-06: 6 mL via EPIDURAL

## 2017-04-06 MED ORDER — PHENYLEPHRINE 40 MCG/ML (10ML) SYRINGE FOR IV PUSH (FOR BLOOD PRESSURE SUPPORT): 80.0000 ug | PREFILLED_SYRINGE | INTRAVENOUS | Status: DC | PRN

## 2017-04-06 MED ORDER — DIPHENHYDRAMINE HCL 50 MG/ML IJ SOLN: 12.5000 mg | INTRAMUSCULAR | Status: DC | PRN

## 2017-04-06 MED ORDER — LACTATED RINGERS IV SOLN
INTRAVENOUS | Status: DC | PRN
  Administered 2017-04-06: 13:00:00 via INTRAVENOUS

## 2017-04-06 MED ORDER — KETOROLAC TROMETHAMINE 30 MG/ML IJ SOLN
INTRAMUSCULAR | Status: AC
  Filled 2017-04-06: qty 1

## 2017-04-06 MED ORDER — FENTANYL 2.5 MCG/ML BUPIVACAINE 1/10 % EPIDURAL INFUSION (WH - ANES)
14.0000 mL/h | INTRAMUSCULAR | Status: DC | PRN
  Administered 2017-04-06: 14 mL/h via EPIDURAL

## 2017-04-06 MED ORDER — PHENYLEPHRINE 40 MCG/ML (10ML) SYRINGE FOR IV PUSH (FOR BLOOD PRESSURE SUPPORT)
PREFILLED_SYRINGE | INTRAVENOUS | Status: AC
  Filled 2017-04-06: qty 10

## 2017-04-06 MED ORDER — METHYLERGONOVINE MALEATE 0.2 MG/ML IJ SOLN
INTRAMUSCULAR | Status: AC
  Filled 2017-04-06: qty 1

## 2017-04-06 MED ORDER — LACTATED RINGERS IV SOLN: 500.0000 mL | Freq: Once | INTRAVENOUS | Status: DC

## 2017-04-06 MED ORDER — LACTATED RINGERS IV SOLN
INTRAVENOUS | Status: DC
  Administered 2017-04-06: 11:00:00 via INTRAUTERINE

## 2017-04-06 MED ORDER — PROMETHAZINE HCL 25 MG/ML IJ SOLN: 6.2500 mg | INTRAMUSCULAR | Status: DC | PRN

## 2017-04-06 MED ORDER — EPHEDRINE 5 MG/ML INJ: 10.0000 mg | INTRAVENOUS | Status: DC | PRN

## 2017-04-06 MED ORDER — WITCH HAZEL-GLYCERIN EX PADS: 1.0000 "application " | MEDICATED_PAD | CUTANEOUS | Status: DC | PRN

## 2017-04-06 MED ORDER — ACETAMINOPHEN 325 MG PO TABS
650.0000 mg | ORAL_TABLET | ORAL | Status: DC | PRN
  Filled 2017-04-06: qty 2

## 2017-04-06 MED ORDER — DIPHENHYDRAMINE HCL 25 MG PO CAPS: 25.0000 mg | ORAL_CAPSULE | Freq: Four times a day (QID) | ORAL | Status: DC | PRN

## 2017-04-06 MED ORDER — DIPHENHYDRAMINE HCL 25 MG PO CAPS
25.0000 mg | ORAL_CAPSULE | ORAL | Status: DC | PRN
  Filled 2017-04-06: qty 1

## 2017-04-06 MED ORDER — NALOXONE HCL 2 MG/2ML IJ SOSY
1.0000 ug/kg/h | PREFILLED_SYRINGE | INTRAVENOUS | Status: DC | PRN
  Filled 2017-04-06: qty 2

## 2017-04-06 MED ORDER — KETOROLAC TROMETHAMINE 30 MG/ML IJ SOLN: 30.0000 mg | Freq: Four times a day (QID) | INTRAMUSCULAR | Status: AC | PRN

## 2017-04-06 MED ORDER — SIMETHICONE 80 MG PO CHEW
80.0000 mg | CHEWABLE_TABLET | ORAL | Status: DC
  Administered 2017-04-06 – 2017-04-09 (×3): 80 mg via ORAL
  Filled 2017-04-06 (×5): qty 1

## 2017-04-06 MED ORDER — HYDROMORPHONE HCL 1 MG/ML IJ SOLN: 0.2500 mg | INTRAMUSCULAR | Status: DC | PRN

## 2017-04-06 MED ORDER — PHENYLEPHRINE 40 MCG/ML (10ML) SYRINGE FOR IV PUSH (FOR BLOOD PRESSURE SUPPORT)
80.0000 ug | PREFILLED_SYRINGE | INTRAVENOUS | Status: DC | PRN
  Administered 2017-04-06: 80 ug via INTRAVENOUS

## 2017-04-06 MED ORDER — MIDAZOLAM HCL 2 MG/2ML IJ SOLN
INTRAMUSCULAR | Status: AC
  Filled 2017-04-06: qty 2

## 2017-04-06 MED ORDER — OXYTOCIN 10 UNIT/ML IJ SOLN
INTRAMUSCULAR | Status: AC
  Filled 2017-04-06: qty 4

## 2017-04-06 MED ORDER — METHYLERGONOVINE MALEATE 0.2 MG/ML IJ SOLN
INTRAMUSCULAR | Status: DC | PRN
  Administered 2017-04-06: 0.2 mg via INTRAMUSCULAR

## 2017-04-06 MED ORDER — ONDANSETRON HCL 4 MG/2ML IJ SOLN: 4.0000 mg | Freq: Three times a day (TID) | INTRAMUSCULAR | Status: DC | PRN

## 2017-04-06 MED ORDER — MIDAZOLAM HCL 2 MG/2ML IJ SOLN
INTRAMUSCULAR | Status: DC | PRN
  Administered 2017-04-06: 2 mg via INTRAVENOUS

## 2017-04-06 MED ORDER — MENTHOL 3 MG MT LOZG: 1.0000 | LOZENGE | OROMUCOSAL | Status: DC | PRN

## 2017-04-06 MED ORDER — NALBUPHINE HCL 10 MG/ML IJ SOLN: 5.0000 mg | INTRAMUSCULAR | Status: DC | PRN

## 2017-04-06 MED ORDER — IBUPROFEN 600 MG PO TABS
600.0000 mg | ORAL_TABLET | Freq: Four times a day (QID) | ORAL | Status: DC
  Administered 2017-04-06 – 2017-04-09 (×11): 600 mg via ORAL
  Filled 2017-04-06 (×11): qty 1

## 2017-04-06 MED ORDER — FENTANYL CITRATE (PF) 100 MCG/2ML IJ SOLN
INTRAMUSCULAR | Status: DC | PRN
  Administered 2017-04-06: 100 ug via INTRAVENOUS

## 2017-04-06 MED ORDER — FENTANYL CITRATE (PF) 100 MCG/2ML IJ SOLN
INTRAMUSCULAR | Status: AC
  Filled 2017-04-06: qty 2

## 2017-04-06 MED ORDER — BUPIVACAINE HCL (PF) 0.5 % IJ SOLN
INTRAMUSCULAR | Status: DC | PRN
  Administered 2017-04-06: 30 mL

## 2017-04-06 MED ORDER — COCONUT OIL OIL
1.0000 "application " | TOPICAL_OIL | Status: DC | PRN
  Administered 2017-04-07: 1 via TOPICAL
  Filled 2017-04-06: qty 120

## 2017-04-06 MED ORDER — KETOROLAC TROMETHAMINE 30 MG/ML IJ SOLN
30.0000 mg | Freq: Four times a day (QID) | INTRAMUSCULAR | Status: AC | PRN
  Administered 2017-04-06: 30 mg via INTRAMUSCULAR

## 2017-04-06 MED ORDER — SODIUM CHLORIDE 0.9% FLUSH: 3.0000 mL | INTRAVENOUS | Status: DC | PRN

## 2017-04-06 MED ORDER — LACTATED RINGERS IV SOLN
INTRAVENOUS | Status: DC
  Administered 2017-04-07: 03:00:00 via INTRAVENOUS

## 2017-04-06 MED ORDER — MORPHINE SULFATE (PF) 0.5 MG/ML IJ SOLN
INTRAMUSCULAR | Status: AC
  Filled 2017-04-06: qty 10

## 2017-04-06 MED ORDER — NALOXONE HCL 0.4 MG/ML IJ SOLN: 0.4000 mg | INTRAMUSCULAR | Status: DC | PRN

## 2017-04-06 MED ORDER — OXYCODONE-ACETAMINOPHEN 5-325 MG PO TABS: 1.0000 | ORAL_TABLET | ORAL | Status: DC | PRN

## 2017-04-06 MED ORDER — SODIUM CHLORIDE 0.9 % IR SOLN
Status: DC | PRN
  Administered 2017-04-06: 200 mL

## 2017-04-06 MED ORDER — PHENYLEPHRINE HCL 10 MG/ML IJ SOLN
INTRAMUSCULAR | Status: DC | PRN
  Administered 2017-04-06 (×3): 80 ug via INTRAVENOUS

## 2017-04-06 MED ORDER — NALBUPHINE HCL 10 MG/ML IJ SOLN: 5.0000 mg | Freq: Once | INTRAMUSCULAR | Status: DC | PRN

## 2017-04-06 MED ORDER — LACTATED RINGERS IV SOLN
INTRAVENOUS | Status: DC | PRN
  Administered 2017-04-06: 40 [IU] via INTRAVENOUS

## 2017-04-06 MED ORDER — MAGNESIUM HYDROXIDE 400 MG/5ML PO SUSP: 30.0000 mL | ORAL | Status: DC | PRN

## 2017-04-06 MED ORDER — OXYCODONE-ACETAMINOPHEN 5-325 MG PO TABS
2.0000 | ORAL_TABLET | ORAL | Status: DC | PRN
  Administered 2017-04-07 – 2017-04-09 (×6): 2 via ORAL
  Filled 2017-04-06 (×7): qty 2

## 2017-04-06 MED ORDER — ZOLPIDEM TARTRATE 5 MG PO TABS: 5.0000 mg | ORAL_TABLET | Freq: Every evening | ORAL | Status: DC | PRN

## 2017-04-06 MED ORDER — DIBUCAINE 1 % RE OINT: 1.0000 "application " | TOPICAL_OINTMENT | RECTAL | Status: DC | PRN

## 2017-04-06 MED ORDER — MEASLES, MUMPS & RUBELLA VAC ~~LOC~~ INJ
0.5000 mL | INJECTION | Freq: Once | SUBCUTANEOUS | Status: DC
  Filled 2017-04-06: qty 0.5

## 2017-04-06 MED ORDER — SODIUM BICARBONATE 8.4 % IV SOLN
INTRAVENOUS | Status: DC | PRN
  Administered 2017-04-06 (×4): 5 mL via EPIDURAL

## 2017-04-06 MED ORDER — SIMETHICONE 80 MG PO CHEW
80.0000 mg | CHEWABLE_TABLET | ORAL | Status: DC | PRN
  Administered 2017-04-07: 80 mg via ORAL

## 2017-04-06 MED ORDER — SCOPOLAMINE 1 MG/3DAYS TD PT72: 1.0000 | MEDICATED_PATCH | Freq: Once | TRANSDERMAL | Status: DC

## 2017-04-06 MED ORDER — OXYCODONE HCL 5 MG/5ML PO SOLN: 5.0000 mg | Freq: Once | ORAL | Status: DC | PRN

## 2017-04-06 MED ORDER — SENNOSIDES-DOCUSATE SODIUM 8.6-50 MG PO TABS
2.0000 | ORAL_TABLET | ORAL | Status: DC
  Administered 2017-04-06 – 2017-04-09 (×3): 2 via ORAL
  Filled 2017-04-06 (×3): qty 2

## 2017-04-06 MED ORDER — PRENATAL MULTIVITAMIN CH
1.0000 | ORAL_TABLET | Freq: Every day | ORAL | Status: DC
  Administered 2017-04-07 – 2017-04-09 (×3): 1 via ORAL
  Filled 2017-04-06 (×3): qty 1

## 2017-04-06 MED ORDER — CEFAZOLIN SODIUM-DEXTROSE 2-3 GM-% IV SOLR
INTRAVENOUS | Status: DC | PRN
  Administered 2017-04-06: 2 g via INTRAVENOUS

## 2017-04-06 MED ORDER — MORPHINE SULFATE (PF) 0.5 MG/ML IJ SOLN
INTRAMUSCULAR | Status: DC | PRN
  Administered 2017-04-06: 4 mg via EPIDURAL

## 2017-04-06 SURGICAL SUPPLY — 37 items
BENZOIN TINCTURE PRP APPL 2/3 (GAUZE/BANDAGES/DRESSINGS) ×2 IMPLANT
CHLORAPREP W/TINT 26ML (MISCELLANEOUS) ×2 IMPLANT
CLAMP CORD UMBIL (MISCELLANEOUS) IMPLANT
CLOTH BEACON ORANGE TIMEOUT ST (SAFETY) ×2 IMPLANT
DRSG OPSITE POSTOP 4X10 (GAUZE/BANDAGES/DRESSINGS) ×2 IMPLANT
ELECT REM PT RETURN 9FT ADLT (ELECTROSURGICAL) ×2
ELECTRODE REM PT RTRN 9FT ADLT (ELECTROSURGICAL) ×1 IMPLANT
EXTRACTOR VACUUM M CUP 4 TUBE (SUCTIONS) IMPLANT
GAUZE SPONGE 4X4 12PLY STRL LF (GAUZE/BANDAGES/DRESSINGS) ×4 IMPLANT
GLOVE BIOGEL PI IND STRL 7.0 (GLOVE) ×3 IMPLANT
GLOVE BIOGEL PI INDICATOR 7.0 (GLOVE) ×3
GLOVE ECLIPSE 7.0 STRL STRAW (GLOVE) ×2 IMPLANT
GOWN STRL REUS W/TWL LRG LVL3 (GOWN DISPOSABLE) ×4 IMPLANT
KIT ABG SYR 3ML LUER SLIP (SYRINGE) IMPLANT
NEEDLE HYPO 22GX1.5 SAFETY (NEEDLE) ×4 IMPLANT
NEEDLE HYPO 25X5/8 SAFETYGLIDE (NEEDLE) ×2 IMPLANT
NS IRRIG 1000ML POUR BTL (IV SOLUTION) ×2 IMPLANT
PACK C SECTION WH (CUSTOM PROCEDURE TRAY) ×2 IMPLANT
PAD ABD 7.5X8 STRL (GAUZE/BANDAGES/DRESSINGS) ×4 IMPLANT
PAD ABD 8X7 1/2 STERILE (GAUZE/BANDAGES/DRESSINGS) ×2 IMPLANT
PAD OB MATERNITY 4.3X12.25 (PERSONAL CARE ITEMS) ×2 IMPLANT
PENCIL SMOKE EVAC W/HOLSTER (ELECTROSURGICAL) ×2 IMPLANT
RTRCTR C-SECT PINK 25CM LRG (MISCELLANEOUS) IMPLANT
SPONGE GAUZE 4X4 12PLY STER LF (GAUZE/BANDAGES/DRESSINGS) ×4 IMPLANT
STRIP CLOSURE SKIN 1/2X4 (GAUZE/BANDAGES/DRESSINGS) ×2 IMPLANT
SUT PDS AB 0 CTX 36 PDP370T (SUTURE) IMPLANT
SUT PLAIN 2 0 (SUTURE) ×1
SUT PLAIN 2 0 XLH (SUTURE) IMPLANT
SUT PLAIN ABS 2-0 CT1 27XMFL (SUTURE) ×1 IMPLANT
SUT VIC AB 0 CTX 36 (SUTURE) ×2
SUT VIC AB 0 CTX36XBRD ANBCTRL (SUTURE) ×2 IMPLANT
SUT VIC AB 2-0 CT1 (SUTURE) ×2 IMPLANT
SUT VIC AB 4-0 KS 27 (SUTURE) ×2 IMPLANT
SYR 30ML LL (SYRINGE) ×2 IMPLANT
SYR CONTROL 10ML LL (SYRINGE) ×2 IMPLANT
TOWEL OR 17X24 6PK STRL BLUE (TOWEL DISPOSABLE) ×2 IMPLANT
TRAY FOLEY BAG SILVER LF 14FR (SET/KITS/TRAYS/PACK) ×2 IMPLANT

## 2017-04-06 NOTE — Progress Notes (Signed)
Patient ID: [name], female   DOB: [birth year], 41 y.o.   MRN: [id number] Vitals:   04/06/17 0600 04/06/17 0630 04/06/17 0633 04/06/17 0700  BP: (!) 108/59 (!) 117/48 (!) 119/51 (!) 123/55  Pulse: 91 80 77 73  Resp: 18 16 16 18   Temp: 98.6 F (37 C)     TempSrc: Axillary     Weight:      Height:       Continues to have intermittent variables, some only down by 20 beats  Others go to 60s and last a minute  UCs every 6-7 min  Unable to advance pitocin much.  Dilation: 4 Effacement (%): 90 Cervical Position: Posterior Station: -2 Presentation: Vertex Exam by:: M Rhyland Hinderliter,CNM  Dr Elonda Husky aware, Will continue observation, amnioinfusion, Pitocin

## 2017-04-06 NOTE — Anesthesia Preprocedure Evaluation (Signed)

## 2017-04-06 NOTE — Progress Notes (Signed)
Patient seen. Doing well no complaints. She has been having large variable decelerations every few contractions. She maintains good variability and has had some accelerations. MVU have been between 60-100. Patient has been off pitocin. Will restart 2/2 at this time and monitor strip closely. If there is any worsening we will have to proceed to c-section. OR is aware as well as D. Anyanwu.  Jacquiline Doe, MD 04/06/17 9:29 AM

## 2017-04-06 NOTE — Transfer of Care (Signed)
Immediate Anesthesia Transfer of Care Note  Patient: Wanda Garcia  Procedure(s) Performed: Procedure(s): CESAREAN SECTION (N/A)  Patient Location: PACU  Anesthesia Type:Regional  Level of Consciousness: awake, alert  and oriented  Airway & Oxygen Therapy: Patient Spontanous Breathing  Post-op Assessment: Report given to RN and Post -op Vital signs reviewed and stable  Post vital signs: Reviewed and stable  Last Vitals:  Vitals:   04/06/17 1130 04/06/17 1155  BP:    Pulse:    Resp: 20   Temp:  37.1 C    Last Pain:  Vitals:   04/06/17 1155  TempSrc: Oral  PainSc:          Complications: No apparent anesthesia complications

## 2017-04-06 NOTE — Progress Notes (Signed)
Subjective: Patient comfortable with epidural.  Objective: Vitals:   04/06/17 0230 04/06/17 0235 04/06/17 0300 04/06/17 0400  BP: 119/62 125/70 (!) 107/52 121/77  Pulse: 75 81 73 72  Resp: 18 16 18 16   Temp:    98.2 F (36.8 C)  TempSrc:    Axillary  Weight:      Height:       No intake/output data recorded.  FHT:  FHR: 145 bpm, variability: moderate,  accelerations:  Present,  decelerations:  Present prolonged, variable UC:   regular, every 5-7 minutes SVE:   Dilation: 4  Effacement: 90%  Station: -2  Exam by: C. Brennan Bailey, Carmelia Roller CNM Pitocin @ 26mu/min  Labs: Lab Results  Component Value Date   WBC 8.8 04/05/2017   HGB 12.3 04/05/2017   HCT 37.4 04/05/2017   MCV 81.7 04/05/2017   PLT 201 04/05/2017    Assessment / Plan: IUP at term. IOL for AMA. GBS pos.  Called to bedside for FHR deceleration with nadir to 70s. O2 applied, pitocin stopped, and position changed. Cervix 4cm/90%/-2.   AROM with moderate meconium and blood tinged fluid with a golf ball sized clot. Bleeding did not continue. IUPC and FSE placed.   Plan: monitor FHR and vaginal bleeding closely. Restart pitocin in 30 minutes if FHR remains stable.   Len Blalock 04/06/2017, 4:06 AM

## 2017-04-06 NOTE — Op Note (Signed)
Wanda Garcia PROCEDURE DATE: 04/06/2017  PREOPERATIVE DIAGNOSES: Intrauterine pregnancy at [redacted]w[redacted]d weeks gestation; failure to progress: arrest of dilation and non-reassuring fetal status  POSTOPERATIVE DIAGNOSES: The same  PROCEDURE: Primary Low Transverse Cesarean Section  SURGEON:  Dr. Verita Schneiders  ASSISTANT:  Dr. Jacquiline Doe  INDICATIONS: Wanda Garcia is a 41 y.o. G1P0 at [redacted]w[redacted]d here for cesarean section secondary to the indications listed under preoperative diagnoses; please see preoperative note for further details.  The risks of cesarean section were discussed with the patient including but were not limited to: bleeding which may require transfusion or reoperation; infection which may require antibiotics; injury to bowel, bladder, ureters or other surrounding organs; injury to the fetus; need for additional procedures including hysterectomy in the event of a life-threatening hemorrhage; placental abnormalities wth subsequent pregnancies, incisional problems, thromboembolic phenomenon and other postoperative/anesthesia complications.   The patient concurred with the proposed plan, giving informed written consent for the procedure.    FINDINGS:  Viable female infant in cephalic presentation.  Apgars 8 and 9.  Nuchal cord x 2; moderate meconium in amniotic fluid.  Intact placenta, three vessel cord.  Normal uterus, fallopian tubes and ovaries bilaterally.  ANESTHESIA: Epidural INTRAVENOUS FLUIDS: 1000 ml ESTIMATED BLOOD LOSS: 600 ml URINE OUTPUT:  300 ml SPECIMENS: Placenta sent to L&D COMPLICATIONS: None immediate  PROCEDURE IN DETAIL:  The patient preoperatively received intravenous antibiotics and had sequential compression devices applied to her lower extremities.  She was then taken to the operating room where the epidural anesthesia was dosed up to surgical level and was found to be adequate. She was then placed in a dorsal supine position with a leftward tilt, and prepped  and draped in a sterile manner.  A foley catheter was placed into her bladder and attached to constant gravity.  After an adequate timeout was performed, a Pfannenstiel skin incision was made with scalpel and carried through to the underlying layer of fascia. The fascia was incised in the midline, and this incision was extended bilaterally using the Mayo scissors.  Kocher clamps were applied to the superior aspect of the fascial incision and the underlying rectus muscles were dissected off bluntly.  A similar process was carried out on the inferior aspect of the fascial incision. The rectus muscles were separated in the midline bluntly and the peritoneum was entered bluntly. Attention was turned to the lower uterine segment where a low transverse hysterotomy was made with a scalpel and extended bilaterally bluntly.  The infant was successfully delivered, the cord was clamped and cut after one minute, and the infant was handed over to the awaiting neonatology team. Uterine massage was then administered, and the placenta delivered intact with a three-vessel cord. The uterus was then cleared of clots and debris.  The hysterotomy was closed with 0 Vicryl in a running locked fashion, and an imbricating layer was also placed with 0 Vicryl.   The pelvis was cleared of all clot and debris. Hemostasis was confirmed on all surfaces.  The peritoneum was closed with a 0 Vicryl running stitch and the rectus muscles were reapproximated using 0 Vicryl interrupted stitches. The fascia was then closed using 0 Vicryl in a running fashion.  The subcutaneous layer was irrigated, then reapproximated with 2-0 plain gut interrupted stitches, and 30 ml of 0.5% Marcaine was injected subcutaneously around the incision.  The skin was closed with a 4-0 Vicryl subcuticular stitch. The patient tolerated the procedure well. Sponge, lap, instrument and needle counts were correct x 3.  She was taken to the recovery room in stable condition.     Verita Schneiders, MD, [place] [facility] [place], [facility]

## 2017-04-06 NOTE — Progress Notes (Signed)
Faculty Practice OB/GYN Attending Note   Subjective:  Called to evaluate patient with repetitive large variable decelerations and minimal cervical change. Decelerations occur when pitocin is turned on, resolves with turning pitocin off. Unable to get adequate contractions due to fetus' inability to tolerate frequent contractions.    Objective:  Blood pressure (!) 141/77, pulse 71, temperature 98.7 F (37.1 C), temperature source Oral, resp. rate 20, height 5\' 1"  (1.549 m), weight 166 lb (75.3 kg), last menstrual period 07/01/2016. FHT  Baseline 150 bpm, moderate variability, +accelerations, no decelerations currently; had large variable decelerations. Toco: q 5-6 minutes Gen: NAD HENT: Normocephalic, atraumatic Lungs: Normal respiratory effort Heart: Regular rate noted Abdomen: NT, gravid fundus, soft Cervix: Deferred Ext: 2+ DTRs, no edema, no cyanosis, negative Homan's sign  Assessment & Plan:  41 y.o. G1P0 at [redacted]w[redacted]d with arrest of active phase and failure of cervical dilation due to fetal intolerance of labor.  Cesarean section recommended.  The risks of cesarean section discussed with the patient included but were not limited to: bleeding which may require transfusion or reoperation; infection which may require antibiotics; injury to bowel, bladder, ureters or other surrounding organs; injury to the fetus; need for additional procedures including hysterectomy in the event of a life-threatening hemorrhage; placental abnormalities wth subsequent pregnancies, incisional problems, thromboembolic phenomenon and other postoperative/anesthesia complications. The patient concurred with the proposed plan, giving informed written consent for the procedure.   Anesthesia and OR aware. Preoperative prophylactic antibiotics and SCDs ordered on call to the OR.  To OR when ready.      Verita Schneiders, MD, [name] Attending [place], [facility]

## 2017-04-06 NOTE — Anesthesia Procedure Notes (Signed)
Epidural Patient location during procedure: OB  Staffing Anesthesiologist: Lyndle Herrlich  Preanesthetic Checklist Completed: patient identified, site marked, surgical consent, pre-op evaluation, timeout performed, IV checked, risks and benefits discussed and monitors and equipment checked  Epidural Patient position: sitting Prep: site prepped and draped and DuraPrep Patient monitoring: continuous pulse ox and blood pressure Approach: midline Location: L3-L4 Injection technique: LOR air  Needle:  Needle type: Tuohy  Needle gauge: 17 G Needle length: 9 cm and 9 Needle insertion depth: 6 cm Catheter type: closed end flexible Catheter size: 19 Gauge Catheter at skin depth: 12 cm Test dose: negative  Assessment Events: blood not aspirated, injection not painful, no injection resistance, negative IV test and no paresthesia  Additional Notes Dosing of Epidural:  1st dose, through catheter ............................................Marland Kitchen  Xylocaine 40 mg  2nd dose, through catheter, after waiting 3 minutes........Marland KitchenXylocaine 60 mg    As each dose occurred, patient was free of IV sx; and patient exhibited no evidence of SA injection.  Patient is more comfortable after epidural dosed. Please see RN's note for documentation of vital signs,and FHR which are stable.  Patient reminded not to try to ambulate with numb legs, and that an RN must be present when she attempts to get up.

## 2017-04-06 NOTE — Anesthesia Postprocedure Evaluation (Signed)
Anesthesia Post Note  Patient: Wanda Garcia  Procedure(s) Performed: Procedure(s) (LRB): CESAREAN SECTION (N/A)  Patient location during evaluation: PACU Anesthesia Type: Epidural Level of consciousness: awake and alert Pain management: pain level controlled Vital Signs Assessment: post-procedure vital signs reviewed and stable Respiratory status: spontaneous breathing, nonlabored ventilation and respiratory function stable Cardiovascular status: stable and blood pressure returned to baseline Anesthetic complications: no        Last Vitals:  Vitals:   04/06/17 1500 04/06/17 1505  BP: (!) 133/57   Pulse: 87 78  Resp: 19 20  Temp:      Last Pain:  Vitals:   04/06/17 1500  TempSrc:   PainSc: 0-No pain   Pain Goal:                 Lynda Rainwater

## 2017-04-06 NOTE — Progress Notes (Signed)
Patient ID: Wanda Garcia, female   DOB: 17-Nov-1976, 41 y.o.   MRN: [id number] Vitals:   04/06/17 0235 04/06/17 0300 04/06/17 0400 04/06/17 0430  BP: 125/70 (!) 107/52 121/77 120/69  Pulse: 81 73 72 78  Resp: 16 18 16 18   Temp:   98.2 F (36.8 C)   TempSrc:   Axillary   Weight:      Height:       FHR has had 3 deep variable decels to 60s with UCs Last UC did not have a decel  Usual measures utilized. Has IUPC/Amnioinfusion in process  Will continue processes  Dr Elonda Husky updated

## 2017-04-07 LAB — CBC
HEMATOCRIT: 31.6 % — AB (ref 36.0–46.0)
Hemoglobin: 10.6 g/dL — ABNORMAL LOW (ref 12.0–15.0)
MCH: 27.4 pg (ref 26.0–34.0)
MCHC: 33.5 g/dL (ref 30.0–36.0)
MCV: 81.7 fL (ref 78.0–100.0)
PLATELETS: 171 10*3/uL (ref 150–400)
RBC: 3.87 MIL/uL (ref 3.87–5.11)
RDW: 14.2 % (ref 11.5–15.5)
WBC: 14.9 10*3/uL — AB (ref 4.0–10.5)

## 2017-04-07 LAB — BIRTH TISSUE RECOVERY COLLECTION (PLACENTA DONATION)

## 2017-04-07 NOTE — Lactation Note (Signed)
This note was copied from a baby's chart. Lactation Consultation Note  Patient Name: Wanda Garcia [id number] Date: 04/07/2017 Reason for consult: Follow-up assessment [employer] interpreter #[id number] ULAG used for visit. FOB not present. Basic teaching reviewed with Mom. Encouraged to BF with feeding ques, 8-12 times or more in 24 hours, cluster feeding reviewed. Assisted Mom with positioning and helping baby obtain good depth with latch. Mom reports good breast changes early pregnancy but she is AMA and does report having to "take a pill to ovulate". This is 1st baby. Discussed with Mom post pumping to encourage milk production and Mom agreeable. Did not have Mom pump at this visit, asked RN to set up pump when FOB present to demonstrate how to help Mom with pumping and cleaning pump pieces. Lactation brochure left for review, advised of OP services and support group. Encouraged Mom to call for assist as needed with latch. Questions answered.   Maternal Data Has patient been taught Hand Expression?: Yes Does the patient have breastfeeding experience prior to this delivery?: No  Feeding Feeding Type: Breast Fed Length of feed: 10 min  LATCH Score/Interventions Latch: Repeated attempts needed to sustain latch, nipple held in mouth throughout feeding, stimulation needed to elicit sucking reflex. Intervention(s): Adjust position;Assist with latch;Breast massage;Breast compression  Audible Swallowing: A few with stimulation  Type of Nipple: Everted at rest and after stimulation  Comfort (Breast/Nipple): Soft / non-tender     Hold (Positioning): Assistance needed to correctly position infant at breast and maintain latch. Intervention(s): Breastfeeding basics reviewed;Support Pillows;Position options;Skin to skin  LATCH Score: 7  Lactation Tools Discussed/Used WIC Program: Yes   Consult Status Consult Status: Follow-up Date: 04/08/17 Follow-up type: In-patient    Katrine Coho 04/07/2017, 2:49 PM

## 2017-04-07 NOTE — Anesthesia Postprocedure Evaluation (Signed)
Anesthesia Post Note  Patient: Wanda Garcia  Procedure(s) Performed: Procedure(s) (LRB): CESAREAN SECTION (N/A)  Patient location during evaluation: Mother Baby Anesthesia Type: Epidural Level of consciousness: awake and alert and oriented Pain management: pain level controlled Vital Signs Assessment: post-procedure vital signs reviewed and stable Respiratory status: spontaneous breathing and nonlabored ventilation Cardiovascular status: stable Postop Assessment: no headache, no backache, patient able to bend at knees, no signs of nausea or vomiting and adequate PO intake Anesthetic complications: no        Last Vitals:  Vitals:   04/06/17 2000 04/07/17 0415  BP: 110/62 114/61  Pulse: 76 76  Resp: 18   Temp: 37 C 36.9 C    Last Pain:  Vitals:   04/07/17 0710  TempSrc:   PainSc: 0-No pain   Pain Goal: Patients Stated Pain Goal: 3 (04/07/17 0015)               Willa Rough

## 2017-04-07 NOTE — Progress Notes (Signed)
POSTPARTUM PROGRESS NOTE  Post Partum Day #1  Subjective:  Wanda Garcia is a 41 y.o. G1P1001 [redacted]w[redacted]d POD#1 s/p cesarean.  No acute events overnight.  Pt denies problems with ambulating, voiding or po intake.  She denies nausea or vomiting.  Pain is moderately controlled.  She has had flatus. She has not had bowel movement.  Lochia Minimal.   Objective: Blood pressure 113/65, pulse 75, temperature 98.6 F (37 C), temperature source Oral, resp. rate 18, height 5\' 1"  (1.549 m), weight 166 lb (75.3 kg), last menstrual period 07/01/2016, SpO2 97 %, currently breastfeeding.  Physical Exam:  General: alert, cooperative and no distress Lochia:normal flow Chest: CTAB Heart: RRR no m/r/g Abdomen: +BS, soft, nontender, bandage in place with no new bleeding Uterine Fundus: firm, below umbilicus DVT Evaluation: No calf swelling or tenderness Extremities: minimal pedal edema   Recent Labs  04/05/17 0938 04/07/17 0539  HGB 12.3 10.6*  HCT 37.4 31.6*    Assessment/Plan:  ASSESSMENT: Wanda Garcia is a 41 y.o. G1P1001 [redacted]w[redacted]d day #1 s/p cesarean delivery, doing well  Continue normal postpartum care.   LOS: 2 days   Everrett Coombe, MD PGY-1 [facility]  04/07/2017, 11:36 AM

## 2017-04-07 NOTE — Addendum Note (Signed)
Addendum  created 04/07/17 0737 by Jonna Munro, CRNA   Sign clinical note

## 2017-04-08 NOTE — Progress Notes (Signed)
POSTPARTUM PROGRESS NOTE  Post Partum Day #2  Subjective:  Wanda Garcia is a 41 y.o. G1P1001 [redacted]w[redacted]d POD#1 s/p cesarean.  No acute events overnight.  Pt denies problems with ambulating, voiding or po intake.  She denies nausea or vomiting.  Pain is currently not well-controlled. Reports increased pain this morning which she attributes to walking yesterday and sleeping on her side overnight. Pain primarily located on L at incision site. She has had flatus. She has had bowel movement.  Lochia Minimal.   Objective: Blood pressure 113/65, pulse 75, temperature 98.6 F (37 C), temperature source Oral, resp. rate 18, height 5\' 1"  (1.549 m), weight 166 lb (75.3 kg), last menstrual period 07/01/2016, SpO2 97 %, currently breastfeeding.  Physical Exam:  General: alert, cooperative and no distress Lochia:normal flow Chest: CTAB Heart: RRR no m/r/g Abdomen: +BS, soft, nontender, bandage clean dry and intact Uterine Fundus: firm, below umbilicus DVT Evaluation: No calf swelling or tenderness Extremities: No pedal edema   Recent Labs  04/05/17 0938 04/07/17 0539  HGB 12.3 10.6*  HCT 37.4 31.6*    Assessment/Plan:  ASSESSMENT: Wanda Garcia is a 41 y.o. G1P1001 [redacted]w[redacted]d day #1 s/p cesarean delivery, doing well.  Continue normal postpartum care.   LOS: 3 days   Adin Hector, MD PGY-2 [facility]  04/08/2017, 7:41 AM   OB FELLOW POSTPARTUM PROGRESS NOTE ATTESTATION  I confirm that I have verified the information documented in the resident's note and that I have also personally reperformed the physical exam and all medical decision making activities.     Jacquiline Doe, MD 9:31 AM

## 2017-04-08 NOTE — Lactation Note (Signed)
This note was copied from a baby's chart. Lactation Consultation Note  Patient Name: Wanda Garcia UMPNT'I Date: 04/08/2017   [name] is nursing often and well per mom.  Discussed cluster feeding.  Mom c/o small blister on tip of left nipple.  Nipple appears intact.  Encouraged mom to wait for a wide open mouth prior to latching.  She is using coconut oil for soreness.  Instructed to feed with any feeding cue and call for assist/concerns prn.  Maternal Data    Feeding Feeding Type: Breast Fed Length of feed: 23 min  LATCH Score/Interventions                      Lactation Tools Discussed/Used     Consult Status      Ave Filter 04/08/2017, 2:16 PM

## 2017-04-09 MED ORDER — OXYCODONE-ACETAMINOPHEN 5-325 MG PO TABS: 1.0000 | ORAL_TABLET | ORAL | 0 refills | Status: DC | PRN

## 2017-04-09 NOTE — Discharge Summary (Signed)
Obstetric Discharge Summary Reason for Admission: induction of labor Prenatal Procedures: none Intrapartum Procedures: cesarean: low cervical, transverse Postpartum Procedures: none Complications-Operative and Postpartum: none  41 y.o. G1P0 at [redacted]w[redacted]d with arrest of active phase and failure of cervical dilation due to fetal intolerance of labor.  Cesarean section recommended.  The risks of cesarean section discussed with the patient included but were not limited to: bleeding which may require transfusion or reoperation; infection which may require antibiotics; injury to bowel, bladder, ureters or other surrounding organs; injury to the fetus; need for additional procedures including hysterectomy in the event of a life-threatening hemorrhage; placental abnormalities wth subsequent pregnancies, incisional problems, thromboembolic phenomenon and other postoperative/anesthesia complications. The patient concurred with the proposed plan, giving informed written consent for the procedure.   Anesthesia and OR aware. Preoperative prophylactic antibiotics and SCDs ordered on call to the OR.  To OR when ready.   Hospital Course:  Principal Problem:   S/P cesarean section Active Problems:   Advanced maternal age, primigravida in first trimester, antepartum   Wanda Garcia is a 41 y.o. G1P1001 s/p primary LTCS.  Patient was admitted for IOL for AMA.  She has postpartum course that was uncomplicated including no problems with ambulating, PO intake, urination, pain, or bleeding. The pt feels ready to go home and  will be discharged with outpatient follow-up.   Today: No acute events overnight.  Pt denies problems with ambulating, voiding or po intake.  She denies nausea or vomiting.  Pain is moderately controlled.  She has had flatus. She has not had bowel movement.  Lochia Moderate.  Plan for birth control is  still undecided.  Method of Feeding: Breast  Physical Exam:  General: alert, cooperative and  no distress Lochia: appropriate Uterine Fundus: firm Incision: healing well, no significant drainage, no dehiscence, no significant erythema DVT Evaluation: No evidence of DVT seen on physical exam.  H/H: Lab Results  Component Value Date/Time   HGB 10.6 (L) 04/07/2017 05:39 AM   HCT 31.6 (L) 04/07/2017 05:39 AM    Discharge Diagnoses: Term Pregnancy-delivered  Discharge Information: Date: 04/09/2017 Activity: pelvic rest Diet: routine  Medications: Percocet Breast feeding:  Yes Condition: stable Instructions: refer to handout Discharge to: home   Discharge Instructions    Call MD for:  difficulty breathing, headache or visual disturbances    Complete by:  As directed    Call MD for:  extreme fatigue    Complete by:  As directed    Call MD for:  hives    Complete by:  As directed    Call MD for:  persistant dizziness or light-headedness    Complete by:  As directed    Call MD for:  persistant nausea and vomiting    Complete by:  As directed    Call MD for:  redness, tenderness, or signs of infection (pain, swelling, redness, odor or green/yellow discharge around incision site)    Complete by:  As directed    Call MD for:  severe uncontrolled pain    Complete by:  As directed    Call MD for:  temperature >100.4    Complete by:  As directed    Diet - low sodium heart healthy    Complete by:  As directed    Driving restriction     Complete by:  As directed    Avoid driving for at least 2 weeks.   Lifting restrictions    Complete by:  As directed  Weight restriction of 10 lbs.   Sexual acrtivity    Complete by:  As directed    No sexual intercourse or tampon usage for six weeks        Adin Hector ,MD 04/09/2017,7:32 AM

## 2017-04-09 NOTE — Progress Notes (Signed)
CSW received consult for depression in pregnancy.  CSW reviewed MOB's medical record and does not see a depression noted in H&P, RN Admission Summary, or in PNR from [facility].  CSW is screening out referral.  Please contact CSW if current concerns arise.

## 2017-04-09 NOTE — Lactation Note (Signed)
This note was copied from a baby's chart. Lactation Consultation Note: when I arrive in room, mother was finishing pumping for 15 mins. Mothers breast are filling. Infant placed on the left breast . LC assist with latch and massaging breast while infant is suckling. Infant has wide open mouth. Mother complaints of pain with the latch. She has a tiny crack in the center of the left nipple. Taught parents to tug on the chin for wider gape. Infants lips are flanged well. Infant was given 10 ml of ebm with a curved tip syringe at the breast. Mother has a harmony hand pump and advised to pre pump to soften breast before latching infant. Advised to watch infants cues for feeding . Feed infant 8-12 times in 24 hours. Suggested frequent skin to skin. Mother advised to soften breast with frequent feedings . Mother advised to get a well fitting bra. Advised to use ice to decrease swelling as needed. Mother receptive to all teaching. She was given comfort gels. Mother has a positional strip and crack on the rt nipple. Mother is active with [facility] . Advised to phone [facility] when she gets home.  Mother informed of available lactation services and community support. Mother denies any further questions.   Patient Name: Wanda Garcia Date: 04/09/2017 Reason for consult: Follow-up assessment   Maternal Data    Feeding Feeding Type: Breast Fed Length of feed: 30 min  LATCH Score/Interventions Latch: Grasps breast easily, tongue down, lips flanged, rhythmical sucking.  Audible Swallowing: Spontaneous and intermittent  Type of Nipple: Everted at rest and after stimulation Intervention(s): Double electric pump  Comfort (Breast/Nipple): Filling, red/small blisters or bruises, mild/mod discomfort (positional strip and small crack on the rt nipple)  Problem noted: Filling;Cracked, bleeding, blisters, bruises;Mild/Moderate discomfort Interventions (Filling): Massage;Firm support Interventions  (Mild/moderate discomfort): Hand massage;Hand expression;Comfort gels  Hold (Positioning): Assistance needed to correctly position infant at breast and maintain latch. Intervention(s): Support Pillows;Position options  LATCH Score: 8  Lactation Tools Discussed/Used     Consult Status Consult Status: Complete    Darla Lesches 04/09/2017, 11:25 AM

## 2017-04-27 ENCOUNTER — Telehealth: Payer: Self-pay

## 2017-04-27 NOTE — Telephone Encounter (Signed)
Opened in error

## 2017-05-30 ENCOUNTER — Inpatient Hospital Stay (HOSPITAL_COMMUNITY)
Admission: AD | Admit: 2017-05-30 | Discharge: 2017-05-31 | Disposition: A | Discharge status: Home / Self Care | Payer: Medicaid Other | Source: Ambulatory Visit | Attending: Obstetrics and Gynecology | Admitting: Obstetrics and Gynecology

## 2017-05-30 DIAGNOSIS — B359 Dermatophytosis, unspecified: Secondary | ICD-10-CM

## 2017-05-30 DIAGNOSIS — N760 Acute vaginitis: Secondary | ICD-10-CM | POA: Diagnosis present

## 2017-05-30 LAB — URINALYSIS, ROUTINE W REFLEX MICROSCOPIC
Bilirubin Urine: NEGATIVE
Glucose, UA: NEGATIVE mg/dL
Hgb urine dipstick: NEGATIVE
Ketones, ur: NEGATIVE mg/dL
LEUKOCYTES UA: NEGATIVE
NITRITE: NEGATIVE
PROTEIN: NEGATIVE mg/dL
SPECIFIC GRAVITY, URINE: 1.001 — AB (ref 1.005–1.030)
pH: 5 (ref 5.0–8.0)

## 2017-05-30 NOTE — MAU Note (Signed)
Pt reports she has some "bumps" on her vagina at the beginning of the week. Now they have spread and they are burning and itching. Denies any discharge.Delivered on 04/06/2017

## 2017-05-31 DIAGNOSIS — B359 Dermatophytosis, unspecified: Secondary | ICD-10-CM | POA: Diagnosis not present

## 2017-05-31 LAB — WET PREP, GENITAL
CLUE CELLS WET PREP: NONE SEEN
SPERM: NONE SEEN
TRICH WET PREP: NONE SEEN
Yeast Wet Prep HPF POC: NONE SEEN

## 2017-05-31 MED ORDER — CLOTRIMAZOLE 1 % EX CREA: 1.0000 "application " | TOPICAL_CREAM | Freq: Two times a day (BID) | CUTANEOUS | 0 refills | Status: DC

## 2017-05-31 NOTE — Discharge Instructions (Signed)

## 2017-05-31 NOTE — MAU Provider Note (Signed)
  History     CSN: [id number]  Arrival date and time: 05/30/17 2326 Arabic interpreter via video present    Chief Complaint  Patient presents with  . Vaginitis   Nonpregnant female here with vaginal bumps. Sx started about 1 week ago and was just one small area at that time but has spread. Area is itchy. She tried a moisturizer and antibacterial ointment and had not relief. She has not been sexually active in 9 months and no hx of STDs. No new detergents, lotions, or soaps.   Past Medical History:  Diagnosis Date  . Medical history non-contributory     Past Surgical History:  Procedure Laterality Date  . CESAREAN SECTION N/A 04/06/2017   Procedure: CESAREAN SECTION;  Surgeon: Osborne Oman, MD;  Location: [facility];  Service: Obstetrics;  Laterality: N/A;  . NO PAST SURGERIES      No family history on file.  Social History  Substance Use Topics  . Smoking status: Never Smoker  . Smokeless tobacco: Never Used  . Alcohol use No    Allergies: No Known Allergies  Prescriptions Prior to Admission  Medication Sig Dispense Refill Last Dose  . acetaminophen (TYLENOL) 325 MG tablet Take 650 mg by mouth every 6 (six) hours as needed for mild pain, moderate pain or headache.    Past Week at Unknown time  . oxyCODONE-acetaminophen (PERCOCET/ROXICET) 5-325 MG tablet Take 1 tablet by mouth every 4 (four) hours as needed (pain scale 4-7). 30 tablet 0   . Prenatal Vit-Fe Fumarate-FA (PRENATAL MULTIVITAMIN) TABS tablet Take 1 tablet by mouth daily.    04/05/2017 at Unknown time    Review of Systems  Skin: Positive for rash.   Physical Exam   Blood pressure 108/74, pulse 75, temperature 98.2 F (36.8 C), resp. rate 18, weight 146 lb (66.2 kg), unknown if currently breastfeeding.  Physical Exam  Constitutional: She is oriented to person, place, and time. She appears well-developed and well-nourished. No distress.  HENT:  Head: Normocephalic.  Neck: Normal range of  motion.  Cardiovascular: Normal rate.   Respiratory: Effort normal.  Genitourinary:     Genitourinary Comments: Circular, erythematous raised scaly lesion x3  Musculoskeletal: Normal range of motion.  Neurological: She is alert and oriented to person, place, and time.  Skin: Skin is warm and dry.  Psychiatric: She has a normal mood and affect.    MAU Course  Procedures  MDM Labs ordered and reviewed.   Assessment and Plan   1. Ringworm    Discharge home Follow up at [facility] as needed Rx Clotrimazole  Allergies as of 05/31/2017   No Known Allergies     Medication List    STOP taking these medications   oxyCODONE-acetaminophen 5-325 MG tablet Commonly known as:  PERCOCET/ROXICET     TAKE these medications   acetaminophen 325 MG tablet Commonly known as:  TYLENOL Take 650 mg by mouth every 6 (six) hours as needed for mild pain, moderate pain or headache.   clotrimazole 1 % cream Commonly known as:  LOTRIMIN Apply 1 application topically 2 (two) times daily. 7-10 days   prenatal multivitamin Tabs tablet Take 1 tablet by mouth daily.      Julianne Handler, CNM 05/31/2017, 12:20 AM

## 2017-06-01 LAB — GC/CHLAMYDIA PROBE AMP (~~LOC~~) NOT AT ARMC
CHLAMYDIA, DNA PROBE: NEGATIVE
NEISSERIA GONORRHEA: NEGATIVE

## 2017-09-28 IMAGING — US US OB TRANSVAGINAL
1 series · 15 of 28 positions shown · non-contrast
Comparison: None.

CLINICAL DATA: Pelvic pain for 3 weeks

EXAM:
OBSTETRIC <14 WK US AND TRANSVAGINAL OB US
TECHNIQUE: Both transabdominal and transvaginal ultrasound examinations were
performed for complete evaluation of the gestation as well as the
maternal uterus, adnexal regions, and pelvic cul-de-sac.
Transvaginal technique was performed to assess early pregnancy.

[Series 1: us ob transvaginal · 15 of 70 slices shown]
[im 1/70]
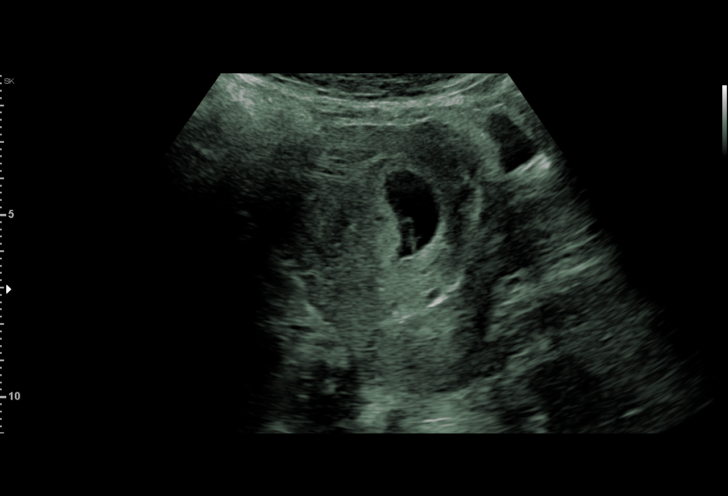
[im 6/70]
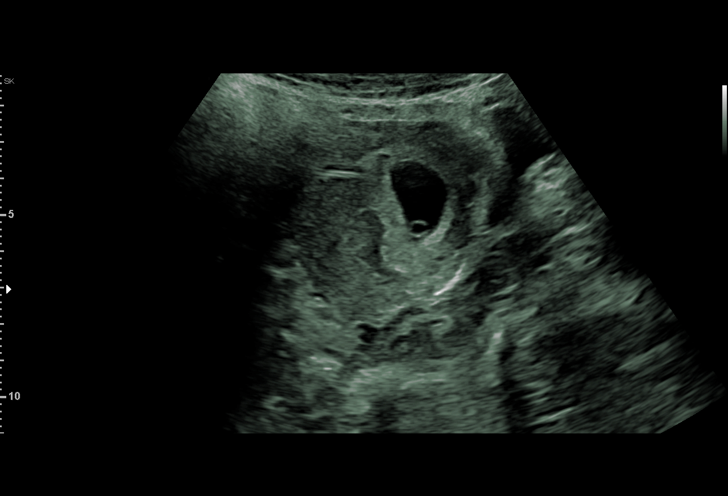
[im 11/70]
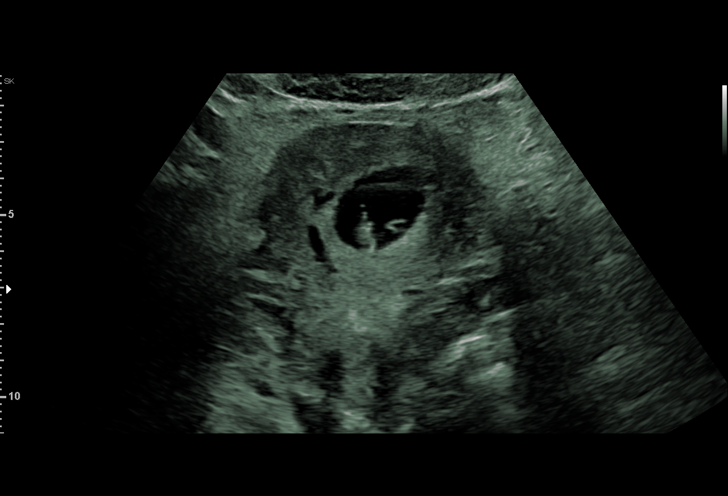
[im 16/70]
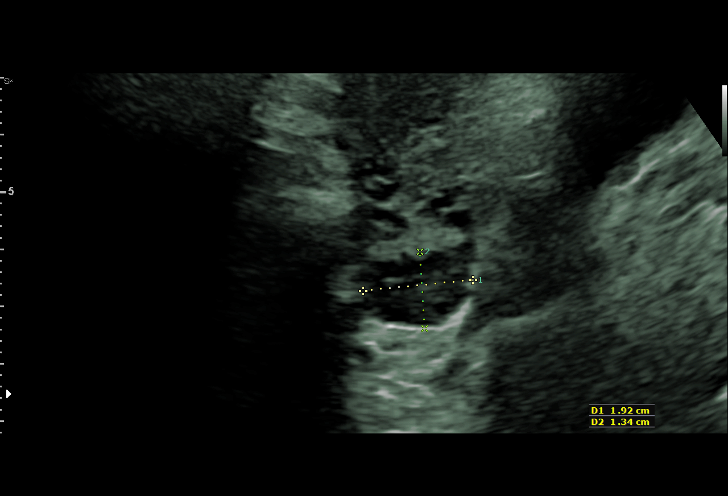
[im 21/70]
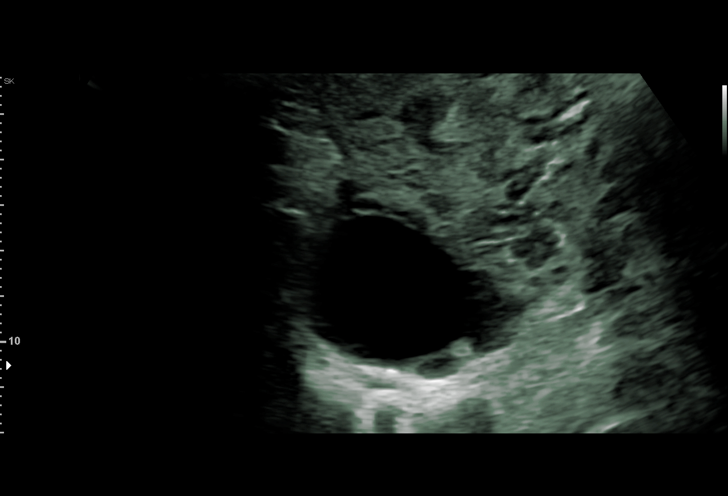
[im 26/70]
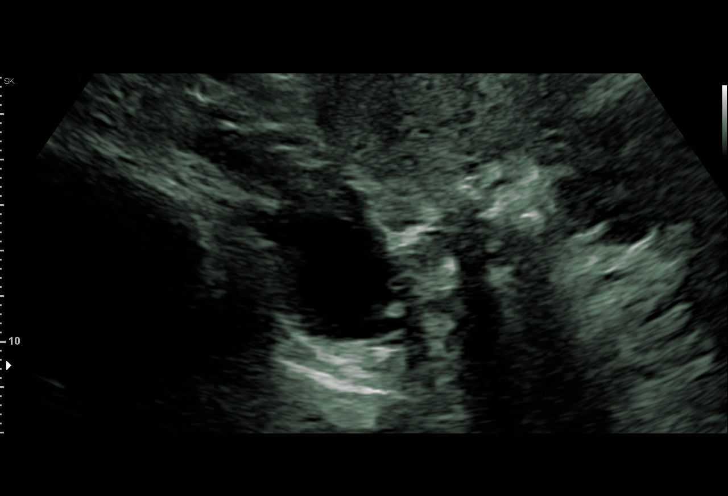
[im 31/70]
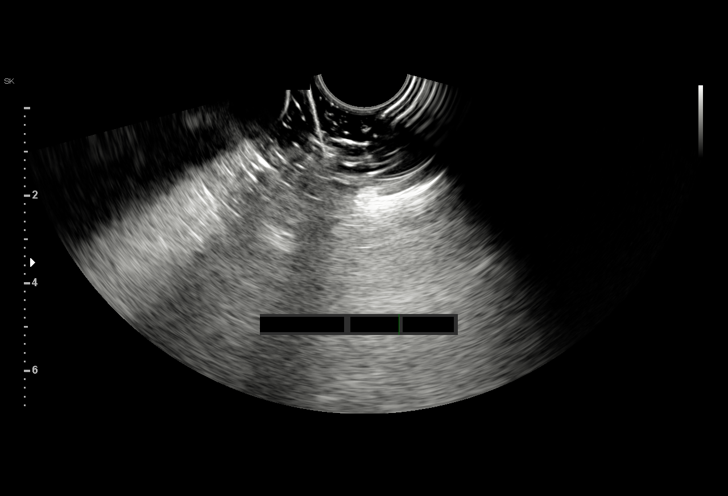
[im 36/70]
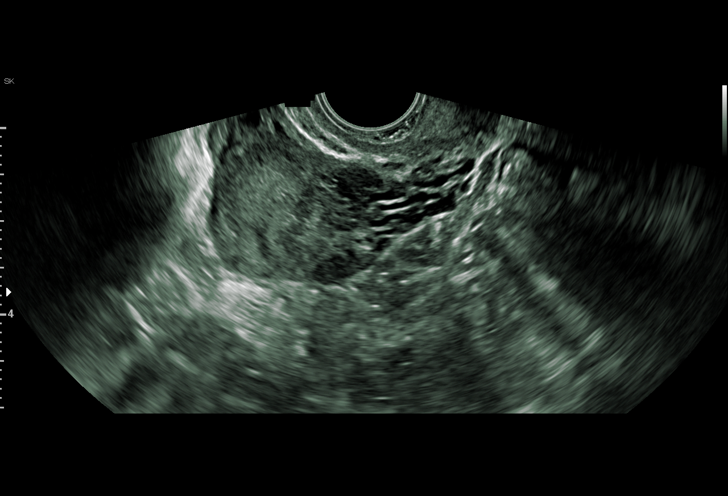
[im 39/70]
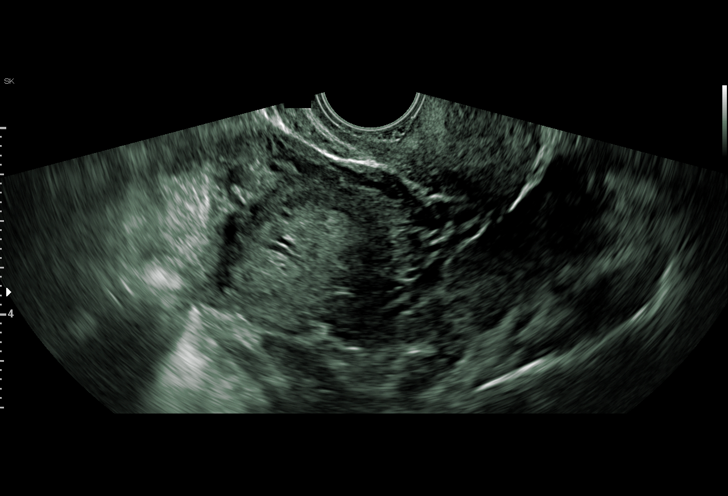
[im 44/70]
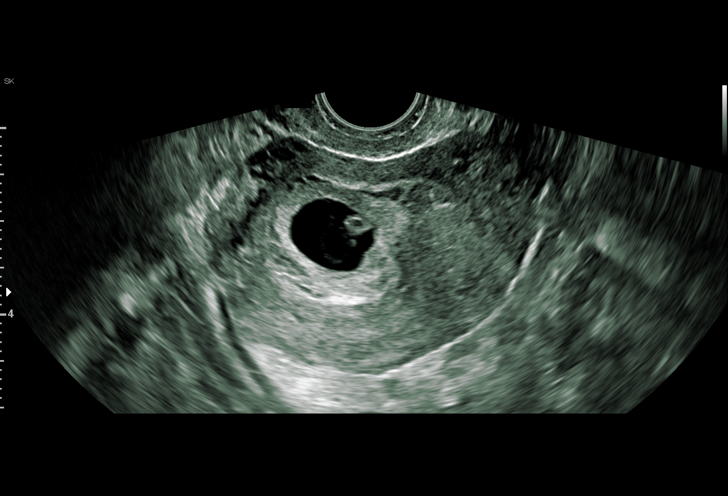
[im 49/70]
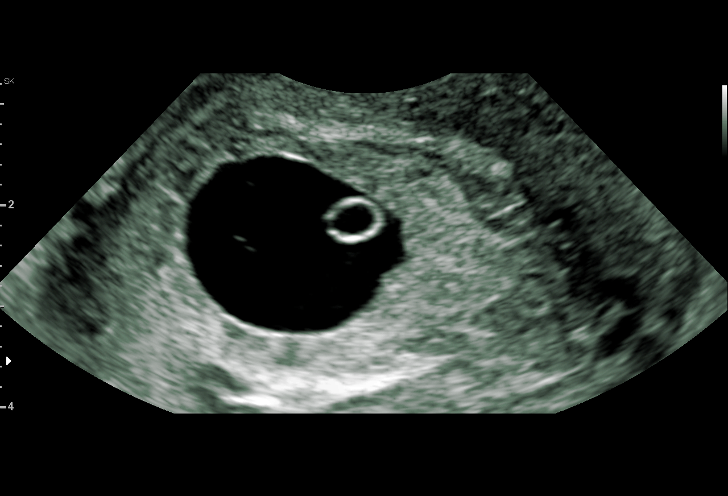
[im 54/70]
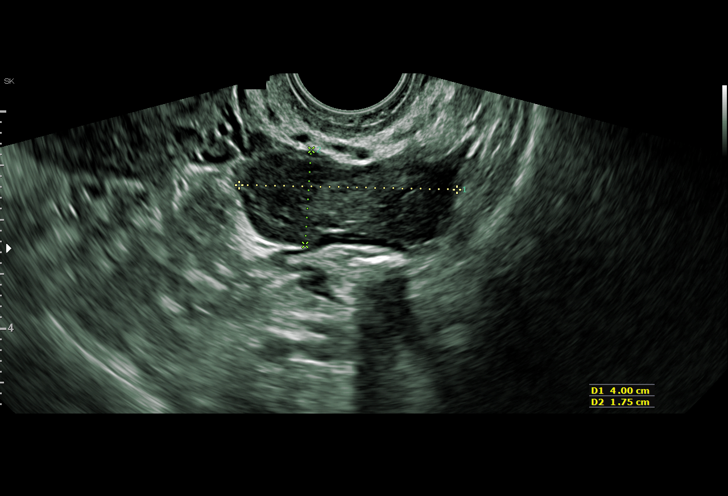
[im 59/70]
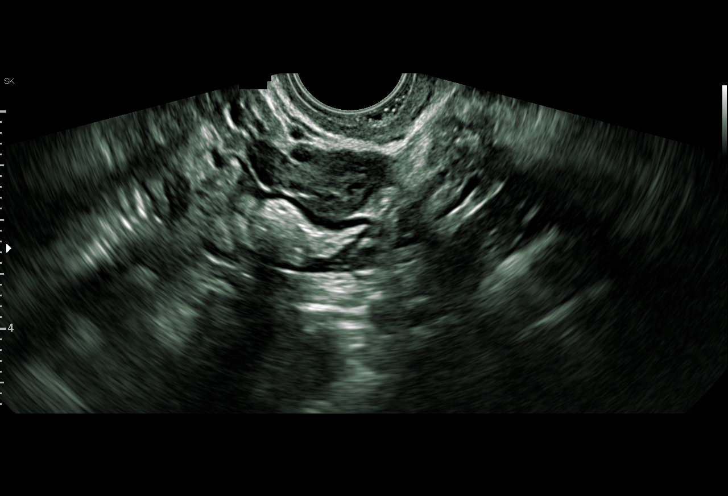
[im 64/70]
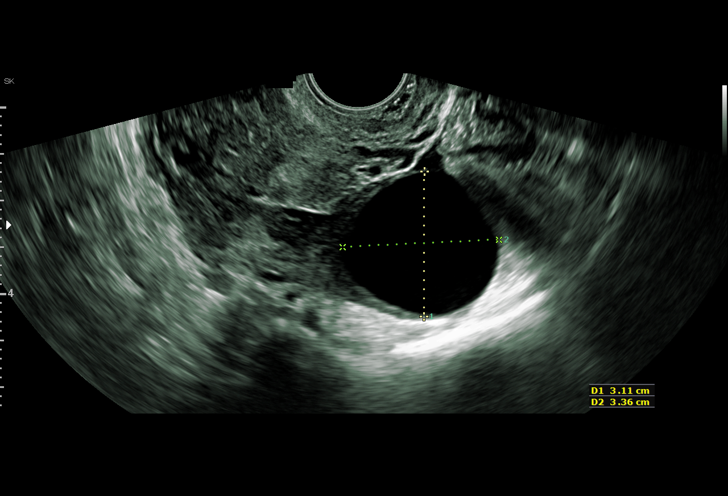
[im 70/70]
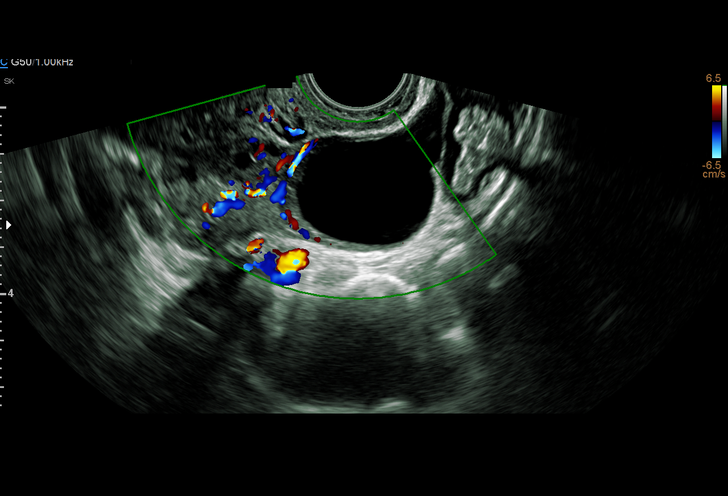

[15 of 28 positions shown; findings below may reference images not displayed]

FINDINGS: Intrauterine gestational sac: Single

Yolk sac:  Present

Embryo:  Present

Cardiac Activity: Present

Heart Rate: 148  bpm

MSD: Not applicable

CRL:  10.4  mm   7 w   1 d                  US EDC: 04/05/2017

Subchorionic hemorrhage:  None visualized.

Maternal uterus/adnexae: Anechoic simple appearing right ovarian
x 3.1 x 3.5 cm cyst. Unremarkable left ovary measuring 4 x 1.8 x
cm. No uterine mass noted.
IMPRESSION: Seven week 1 day single live intrauterine gestation.

Anechoic simple appearing right ovarian 3.4 x 3.1 x 3.5 cm cyst

## 2017-11-04 IMAGING — US US MFM FETAL NUCHAL TRANSLUCENCY
1 series · 15 of 28 positions shown · non-contrast
Comparison: none

[Series 1: us mfm fetal nuchal translucency · 15 of 41 slices shown]
[im 1/41]
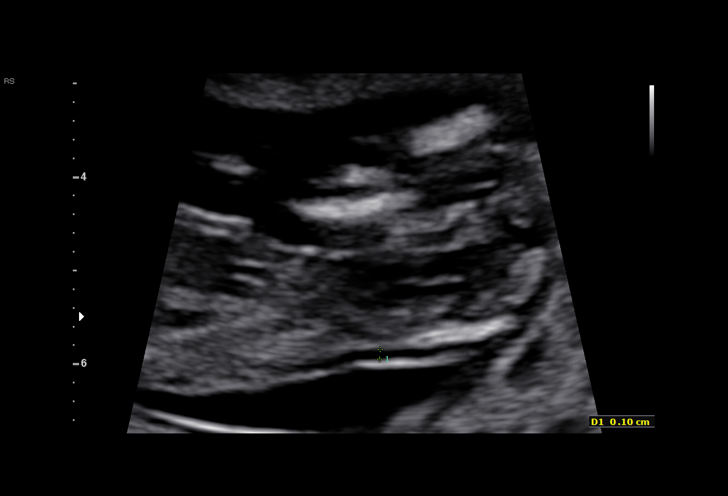
[im 3/41]
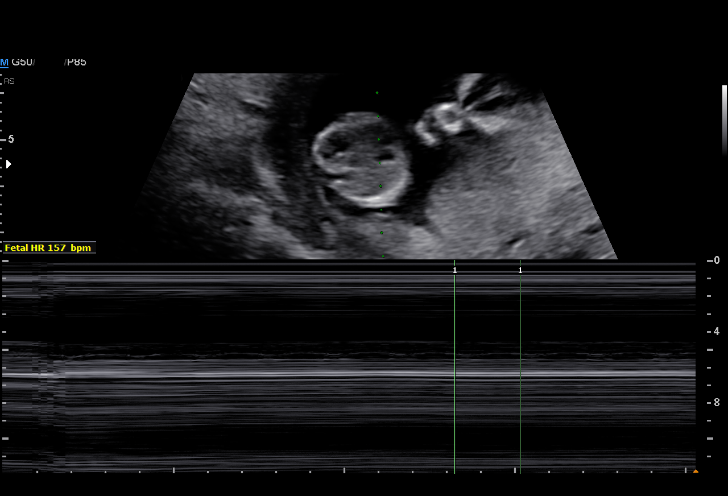
[im 6/41]
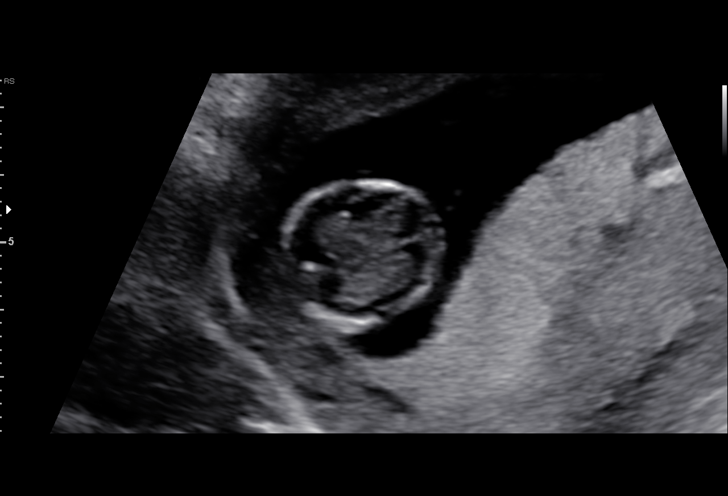
[im 9/41]
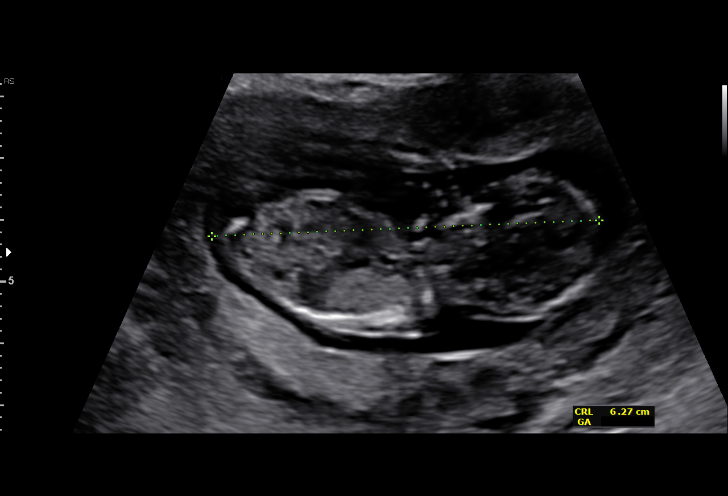
[im 12/41]
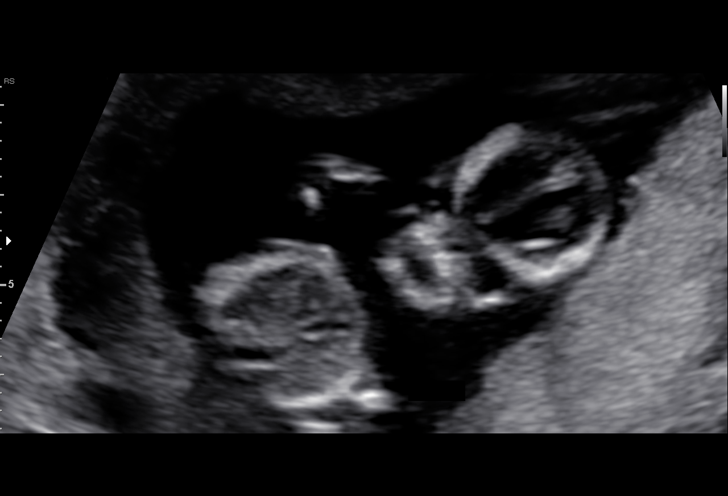
[im 15/41]
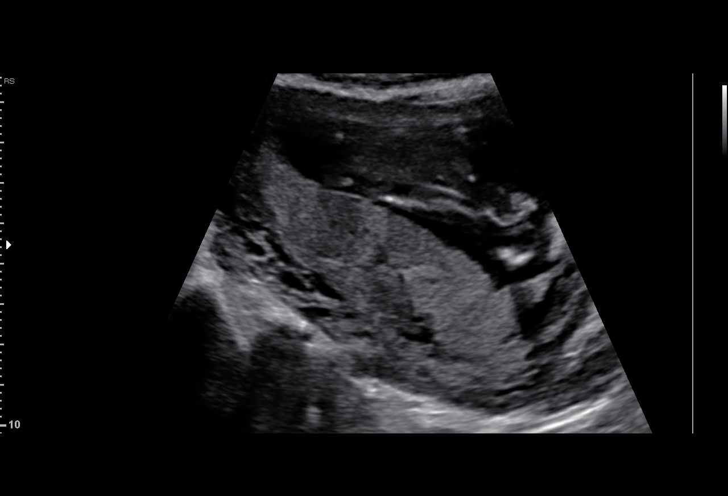
[im 18/41]
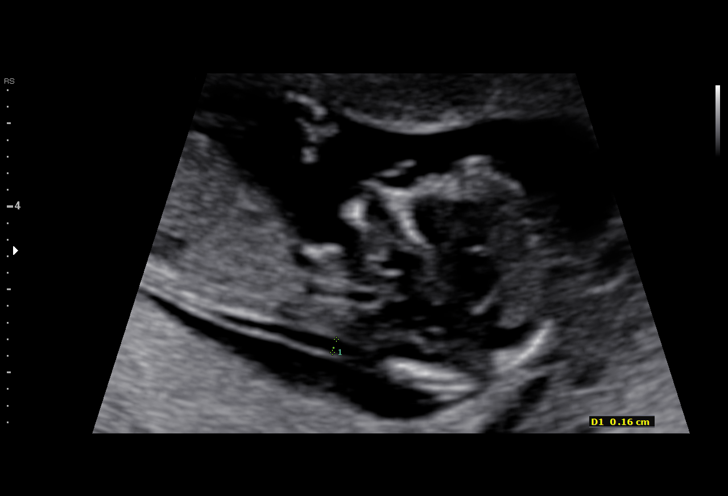
[im 21/41]
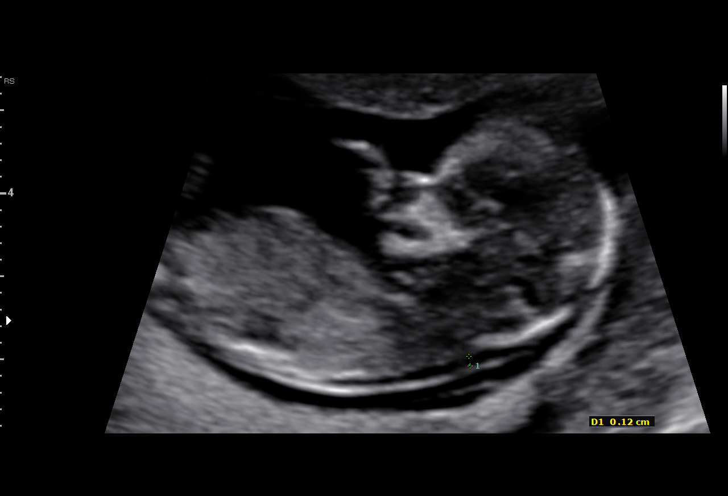
[im 23/41]
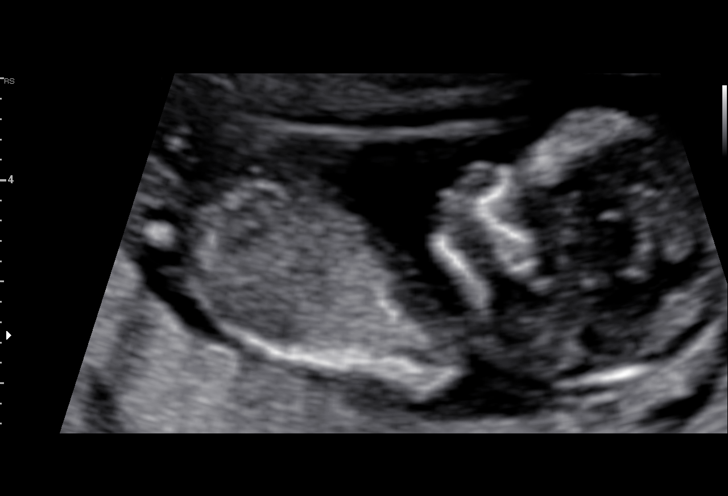
[im 26/41]
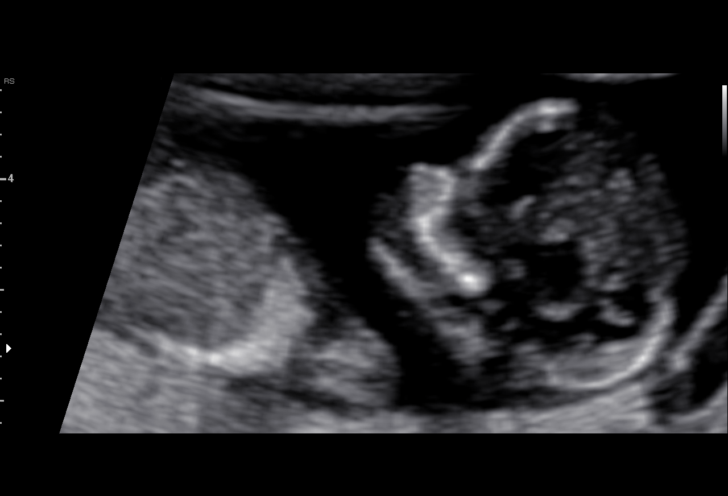
[im 29/41]
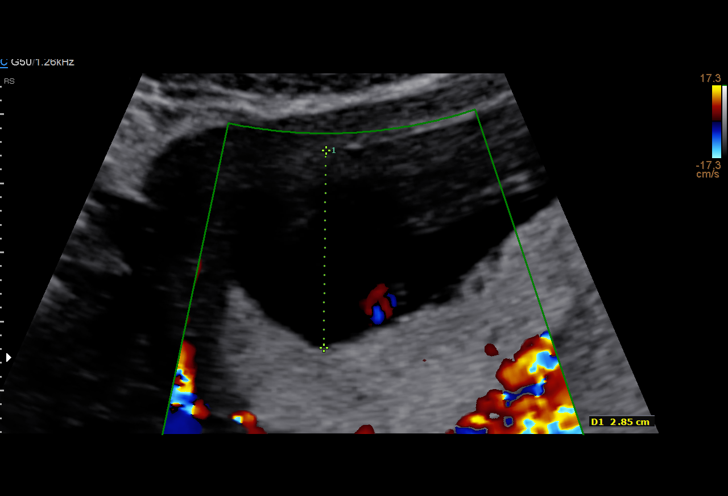
[im 32/41]
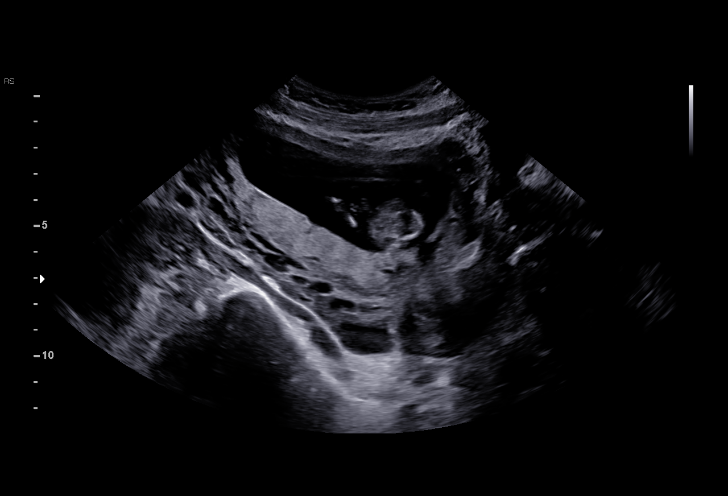
[im 35/41]
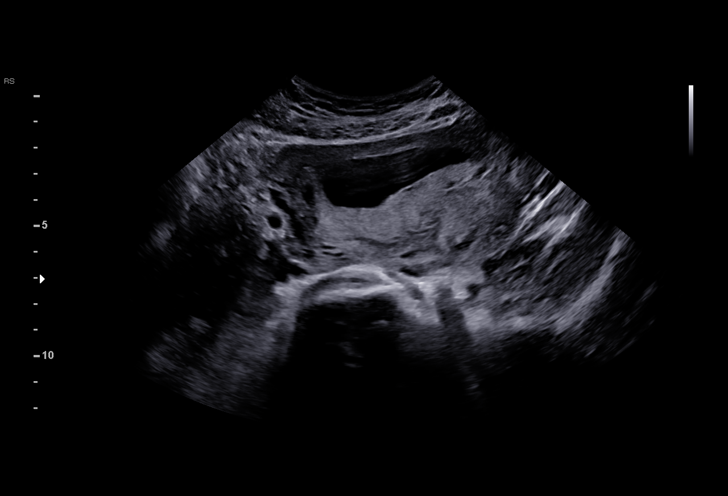
[im 38/41]
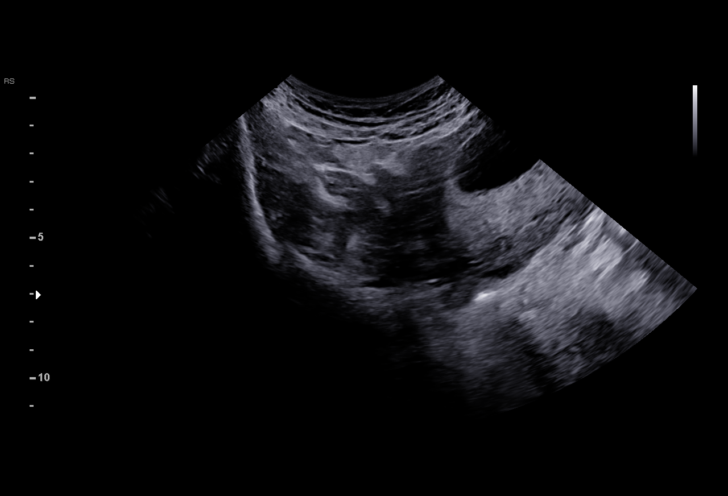
[im 41/41]
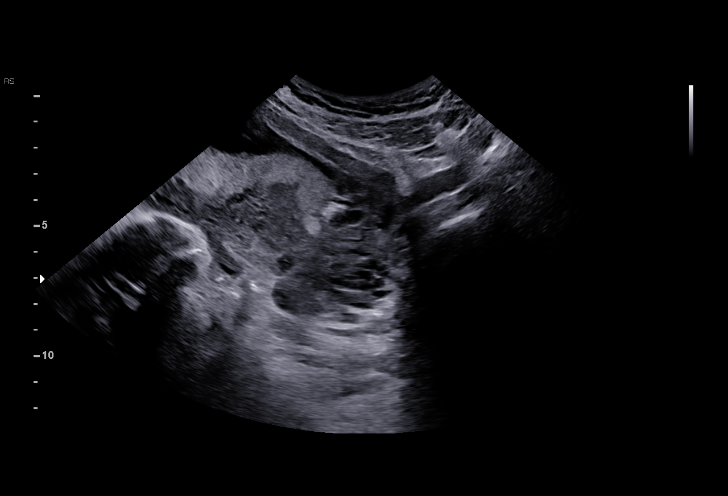

[15 of 28 positions shown; findings below may reference images not displayed]

TRANSLUCENCY

1  BAHR                [id number]      [id number]     [id number]
KLE[facility]
Indications

12 weeks gestation of pregnancy
Encounter for nuchal translucency
Advanced maternal age primigravida 34+,
first trimester
OB History

Gravidity:    1
Fetal Evaluation

Num Of Fetuses:     1
Fetal Heart         157
Rate(bpm):
Cardiac Activity:   Observed
Presentation:       Variable
Placenta:           Posterior

Amniotic Fluid
AFI FV:      Subjectively within normal limits

Largest Pocket(cm)
2.9
Biometry

CRL:      62.7  mm     G. Age:  12w 4d                  EDD:   04/04/17

NT:        1.4  mm
Gestational Age

LMP:           12w 1d       Date:   07/01/16                 EDD:   04/07/17
Best:          12w 1d    Det. By:   LMP  (07/01/16)          EDD:   04/07/17
Anatomy

Choroid Plexus:        Appears normal         Bladder:                Appears normal
Stomach:               Appears normal, left
sided
Cervix Uterus Adnexa

Cervix
Normal appearance by transabdominal scan.

Uterus
No abnormality visualized.

Left Ovary
Not visualized.

Right Ovary
Not visualized.

Adnexa:       No abnormality visualized. No adnexal mass
visualized.
Impression

SIUP at 12+1 weeks
No gross abnormalities identified
NT measurement was within normal limits for this GA; NB
present
Normal amniotic fluid volume
Measurements consistent with LMP dating

After genetic counseling, Ms. Goose Karlin decided to have
cell free DNA screening.
Recommendations

Please see genetic counseling note
Offer MSAFP in the second trimester for ONTD screening
Offer detailed U/S by 18 weeks

## 2018-04-17 ENCOUNTER — Encounter

## 2018-04-23 ENCOUNTER — Encounter: Payer: Self-pay | Admitting: Nurse Practitioner

## 2018-04-23 ENCOUNTER — Ambulatory Visit: Payer: Self-pay | Attending: Nurse Practitioner | Admitting: Nurse Practitioner

## 2018-04-23 VITALS — BP 117/77 | HR 83 | Temp 98.2°F | Resp 18 | Ht 65.0 in | Wt 161.0 lb

## 2018-04-23 DIAGNOSIS — D1779 Benign lipomatous neoplasm of other sites: Secondary | ICD-10-CM

## 2018-04-23 DIAGNOSIS — M779 Enthesopathy, unspecified: Secondary | ICD-10-CM | POA: Insufficient documentation

## 2018-04-23 DIAGNOSIS — Z0000 Encounter for general adult medical examination without abnormal findings: Secondary | ICD-10-CM | POA: Insufficient documentation

## 2018-04-23 DIAGNOSIS — R200 Anesthesia of skin: Secondary | ICD-10-CM | POA: Insufficient documentation

## 2018-04-23 DIAGNOSIS — D171 Benign lipomatous neoplasm of skin and subcutaneous tissue of trunk: Secondary | ICD-10-CM | POA: Insufficient documentation

## 2018-04-23 DIAGNOSIS — M778 Other enthesopathies, not elsewhere classified: Secondary | ICD-10-CM

## 2018-04-23 MED ORDER — DICLOFENAC SODIUM 1 % TD GEL: 4.0000 g | Freq: Four times a day (QID) | TRANSDERMAL | 1 refills | Status: DC

## 2018-04-23 MED FILL — DICLOFENAC SODIUM 1% GEL: 1 | 6 days supply | Qty: 100 | Fill #0

## 2018-04-23 NOTE — Progress Notes (Signed)
Assessment & Plan:  Wanda Garcia was seen today for establish care.  Diagnoses and all orders for this visit:  Lipoma of other specified sites Patient has been [facility] Will obtain US of chest wall once approved for financial assistance  Tendinitis of both wrists -     diclofenac sodium (VOLTAREN) 1 % GEL; Apply 4 g topically 4 (four) times daily. Will obtain imaging of hands and will likely need hand specialist once approved for financial assistance    Patient has been counseled on age-appropriate routine health concerns for screening and prevention. These are reviewed and up-to-date. Referrals have been placed accordingly. Immunizations are up-to-date or declined.    Subjective:   Chief Complaint  Patient presents with  . Establish Care   HPI [name] 42 y.o. female presents to office today to establish care as new patient. She does not have insurance. Her spouse is here interpreting for her today.   Bilateral Hand Numbness Chronic. Over 2 years ago. Bilateral hands are numb in the mornings and numbness decreases as the day progresses but does not completely resolve. She sometimes has difficulty washing dishes or holding her baby.  She describes a sharp stabbing pain in the bilateral wrists that radiates to the upper forearm. Duration of sharp stabbing pain lasts from 20-30 minutes. Aggravating factors NONE; relieving factors : NONE. She has seen a provider for her hand pain in the past and states blood work showed she had tendinitis. She does not remember the medication she was given to treat this.  Skin Mass She has complaints of a small nodule on her right chest that has been present for over 3 years. The area is non tender to touch and has not increased or decreased in size. She does state that it appeared to turn purple a few days ago then reverted back to her normal skin color. She does not recall bumping into any objects or any recent trauma to her  chest.   . Review of Systems  Constitutional: Negative for fever, malaise/fatigue and weight loss.  HENT: Negative.  Negative for nosebleeds.   Eyes: Negative.  Negative for blurred vision, double vision and photophobia.  Respiratory: Negative.  Negative for cough and shortness of breath.   Cardiovascular: Negative.  Negative for chest pain, palpitations and leg swelling.  Gastrointestinal: Negative.  Negative for heartburn, nausea and vomiting.  Musculoskeletal: Negative for falls and myalgias.  Skin:       SEE HPI  Neurological: Positive for tingling and sensory change. Negative for dizziness, tremors, focal weakness, seizures, weakness and headaches.  Psychiatric/Behavioral: Negative.  Negative for suicidal ideas.    Past Medical History:  Diagnosis Date  . Medical history non-contributory     Past Surgical History:  Procedure Laterality Date  . CESAREAN SECTION N/A 04/06/2017   Procedure: CESAREAN SECTION;  Surgeon: Osborne Oman, MD;  Location: [facility];  Service: Obstetrics;  Laterality: N/A;  . NO PAST SURGERIES      History reviewed. No pertinent family history.  Social History Reviewed with no changes to be made today.   Outpatient Medications Prior to Visit  Medication Sig Dispense Refill  . acetaminophen (TYLENOL) 325 MG tablet Take 650 mg by mouth every 6 (six) hours as needed for mild pain, moderate pain or headache.     . Multiple Vitamins-Minerals (WOMENS ONE DAILY PO) Take 1 each by mouth daily.    . clotrimazole (LOTRIMIN) 1 % cream Apply 1 application topically  2 (two) times daily. 7-10 days (Patient not taking: Reported on 04/23/2018) 30 g 0  . Prenatal Vit-Fe Fumarate-FA (PRENATAL MULTIVITAMIN) TABS tablet Take 1 tablet by mouth daily.      No facility-administered medications prior to visit.     No Known Allergies     Objective:    BP 117/77 (BP Location: Left Arm, Patient Position: Sitting, Cuff Size: Normal)   Pulse 83   Temp 98.2 F  (36.8 C) (Oral)   Resp 18   Ht 5\' 5"  (1.651 m)   Wt 161 lb (73 kg)   SpO2 96%   BMI 26.79 kg/m  Wt Readings from Last 3 Encounters:  04/23/18 161 lb (73 kg)  05/30/17 146 lb (66.2 kg)  04/05/17 166 lb (75.3 kg)    Physical Exam  Pulmonary/Chest: She exhibits mass. She exhibits no tenderness, no edema, no deformity, no swelling and no retraction.    Musculoskeletal:       Right hand: She exhibits normal range of motion, no tenderness, no bony tenderness, normal capillary refill, no deformity, no laceration and no swelling.       Left hand: She exhibits normal range of motion, no tenderness, no bony tenderness, normal two-point discrimination, normal capillary refill, no deformity, no laceration and no swelling.  She is able to shake my hands and B/L grip strength is normal 5/5.       Patient has been counseled extensively about nutrition and exercise as well as the importance of adherence with medications and regular follow-up. The patient was given clear instructions to go to ER or return to medical center if symptoms don't improve, worsen or new problems develop. The patient verbalized understanding.   Follow-up: Return in about 2 months (around 06/23/2018) for f/u hand numbness.   Gildardo Pounds, FNP-BC [facility], [place]   04/23/2018, 5:18 PM

## 2018-04-23 NOTE — Patient Instructions (Signed)
Lipoma A lipoma is a noncancerous (benign) tumor that is made up of fat cells. This is a very common type of soft-tissue growth. Lipomas are usually found under the skin (subcutaneous). They may occur in any tissue of the body that contains fat. Common areas for lipomas to appear include the back, shoulders, buttocks, and thighs. Lipomas grow slowly, and they are usually painless. Most lipomas do not cause problems and do not require treatment. What are the causes? The cause of this condition is not known. What increases the risk? This condition is more likely to develop in:  People who are 63-50 years old.  People who have a family history of lipomas.  What are the signs or symptoms? A lipoma usually appears as a small, round bump under the skin. It may feel soft or rubbery, but the firmness can vary. Most lipomas are not painful. However, a lipoma may become painful if it is located in an area where it pushes on nerves. How is this diagnosed? A lipoma can usually be diagnosed with a physical exam. You may also have tests to confirm the diagnosis and to rule out other conditions. Tests may include:  Imaging tests, such as a CT scan or MRI.  Removal of a tissue sample to be looked at under a microscope (biopsy).  How is this treated? Treatment is not needed for small lipomas that are not causing problems. If a lipoma continues to get bigger or it causes problems, removal is often the best option. Lipomas can also be removed to improve appearance. Removal of a lipoma is usually done with a surgery in which the fatty cells and the surrounding capsule are removed. Most often, a medicine that numbs the area (local anesthetic) is used for this procedure. Follow these instructions at home:  Keep all follow-up visits as directed by your health care provider. This is important. Contact a health care provider if:  Your lipoma becomes larger or hard.  Your lipoma becomes painful, red, or  increasingly swollen. These could be signs of infection or a more serious condition. This information is not intended to replace advice given to you by your health care provider. Make sure you discuss any questions you have with your health care provider. Document Released: 10/24/2002 Document Revised: 04/10/2016 Document Reviewed: 10/30/2014 Elsevier Interactive Patient Education  2018 [place].  Lipoma Removal, Care After Refer to this sheet in the next few weeks. These instructions provide you with information about caring for yourself after your procedure. Your health care provider may also give you more specific instructions. Your treatment has been planned according to current medical practices, but problems sometimes occur. Call your health care provider if you have any problems or questions after your procedure. What can I expect after the procedure? After the procedure, it is common to have:  Mild pain.  Swelling.  Bruising.  Follow these instructions at home:  Bathing  Do not take baths, swim, or use a hot tub until your health care provider approves. Ask your health care provider if you can take showers. You may only be allowed to take sponge baths for bathing.  Keep your bandage (dressing) dry until your health care provider says it can be removed. Incision care   Follow instructions from your health care provider about how to take care of your incision. Make sure you: ? Wash your hands with soap and water before you change your bandage (dressing). If soap and water are not available, use hand sanitizer. ?  Change your dressing as told by your health care provider. ? Leave stitches (sutures), skin glue, or adhesive strips in place. These skin closures may need to stay in place for 2 weeks or longer. If adhesive strip edges start to loosen and curl up, you may trim the loose edges. Do not remove adhesive strips completely unless your health care provider tells you to do  that.  Check your incision area every day for signs of infection. Check for: ? More redness, swelling, or pain. ? Fluid or blood. ? Warmth. ? Pus or a bad smell. Driving  Do not drive or operate heavy machinery while taking prescription pain medicine.  Do not drive for 24 hours if you received a medicine to help you relax (sedative) during your procedure.  Ask your health care provider when it is safe for you to drive. General instructions  Take over-the-counter and prescription medicines only as told by your health care provider.  Do not use any tobacco products, such as cigarettes, chewing tobacco, and e-cigarettes. These can delay healing. If you need help quitting, ask your health care provider.  Return to your normal activities as told by your health care provider. Ask your health care provider what activities are safe for you.  Keep all follow-up visits as told by your health care provider. This is important. Contact a health care provider if:  You have more redness, swelling, or pain around your incision.  You have fluid or blood coming from your incision.  Your incision feels warm to the touch.  You have pus or a bad smell coming from your incision.  You have pain that does not get better with medicine. Get help right away if:  You have chills or a fever.  You have severe pain. This information is not intended to replace advice given to you by your health care provider. Make sure you discuss any questions you have with your health care provider. Document Released: 01/17/2016 Document Revised: 04/15/2016 Document Reviewed: 01/17/2016 Elsevier Interactive Patient Education  2018 [employer].

## 2018-06-04 ENCOUNTER — Other Ambulatory Visit (HOSPITAL_COMMUNITY): Payer: Self-pay | Admitting: *Deleted

## 2018-06-04 DIAGNOSIS — N644 Mastodynia: Secondary | ICD-10-CM

## 2018-06-09 ENCOUNTER — Encounter (HOSPITAL_COMMUNITY): Payer: Self-pay | Admitting: *Deleted

## 2018-06-18 ENCOUNTER — Ambulatory Visit: Payer: Self-pay | Attending: Nurse Practitioner

## 2018-06-18 ENCOUNTER — Telehealth: Payer: Self-pay | Admitting: Nurse Practitioner

## 2018-06-18 NOTE — Telephone Encounter (Signed)
I will place referral once she has been approved

## 2018-06-18 NOTE — Telephone Encounter (Signed)
Will route to PCP. Patient do have an upcoming appt. With PCP on 06/25/2018.

## 2018-06-18 NOTE — Telephone Encounter (Signed)
Patient came in to apply for financial assistance, pt's husband states her hand numbness, has not gotten any better.  I do not see a referral in the system. - Patient is pending some documentation but states they will drop off today and I can get an urgent review but need referral in system. Could you please f/up

## 2018-06-25 ENCOUNTER — Ambulatory Visit: Payer: Self-pay | Attending: Nurse Practitioner | Admitting: Nurse Practitioner

## 2018-06-25 ENCOUNTER — Encounter: Payer: Self-pay | Admitting: Nurse Practitioner

## 2018-06-25 VITALS — BP 102/67 | HR 78 | Temp 99.1°F | Ht 65.0 in | Wt 159.8 lb

## 2018-06-25 DIAGNOSIS — M25531 Pain in right wrist: Secondary | ICD-10-CM | POA: Insufficient documentation

## 2018-06-25 DIAGNOSIS — G8929 Other chronic pain: Secondary | ICD-10-CM | POA: Insufficient documentation

## 2018-06-25 DIAGNOSIS — M79641 Pain in right hand: Secondary | ICD-10-CM | POA: Insufficient documentation

## 2018-06-25 DIAGNOSIS — Z0000 Encounter for general adult medical examination without abnormal findings: Secondary | ICD-10-CM | POA: Insufficient documentation

## 2018-06-25 DIAGNOSIS — M25532 Pain in left wrist: Secondary | ICD-10-CM | POA: Insufficient documentation

## 2018-06-25 DIAGNOSIS — R200 Anesthesia of skin: Secondary | ICD-10-CM | POA: Insufficient documentation

## 2018-06-25 DIAGNOSIS — M79642 Pain in left hand: Secondary | ICD-10-CM | POA: Insufficient documentation

## 2018-06-25 NOTE — Patient Instructions (Signed)
Neuropathic Pain Neuropathic pain is pain caused by damage to the nerves that are responsible for certain sensations in your body (sensory nerves). The pain can be caused by damage to:  The sensory nerves that send signals to your spinal cord and brain (peripheral nervous system).  The sensory nerves in your brain or spinal cord (central nervous system).  Neuropathic pain can make you more sensitive to pain. What would be a minor sensation for most people may feel very painful if you have neuropathic pain. This is usually a long-term condition that can be difficult to treat. The type of pain can differ from person to person. It may start suddenly (acute), or it may develop slowly and last for a long time (chronic). Neuropathic pain may come and go as damaged nerves heal or may stay at the same level for years. It often causes emotional distress, loss of sleep, and a lower quality of life. What are the causes? The most common cause of damage to a sensory nerve is diabetes. Many other diseases and conditions can also cause neuropathic pain. Causes of neuropathic pain can be classified as:  Toxic. Many drugs and chemicals can cause toxic damage. The most common cause of toxic neuropathic pain is damage from drug treatment for cancer (chemotherapy).  Metabolic. This type of pain can happen when a disease causes imbalances that damage nerves. Diabetes is the most common of these diseases. Vitamin B deficiency caused by long-term alcohol abuse is another common cause.  Traumatic. Any injury that cuts, crushes, or stretches a nerve can cause damage and pain. A common example is feeling pain after losing an arm or leg (phantom limb pain).  Compression-related. If a sensory nerve gets trapped or compressed for a long period of time, the blood supply to the nerve can be cut off.  Vascular. Many blood vessel diseases can cause neuropathic pain by decreasing blood supply and oxygen to nerves.  Autoimmune.  This type of pain results from diseases in which the body's defense system mistakenly attacks sensory nerves. Examples of autoimmune diseases that can cause neuropathic pain include lupus and multiple sclerosis.  Infectious. Many types of viral infections can damage sensory nerves and cause pain. Shingles infection is a common cause of this type of pain.  Inherited. Neuropathic pain can be a symptom of many diseases that are passed down through families (genetic).  What are the signs or symptoms? The main symptom is pain. Neuropathic pain is often described as:  Burning.  Shock-like.  Stinging.  Hot or cold.  Itching.  How is this diagnosed? No single test can diagnose neuropathic pain. Your health care provider will do a physical exam and ask you about your pain. You may use a pain scale to describe how bad your pain is. You may also have tests to see if you have a high sensitivity to pain and to help find the cause and location of any sensory nerve damage. These tests may include:  Imaging studies, such as: ? X-rays. ? CT scan. ? MRI.  Nerve conduction studies to test how well nerve signals travel through your sensory nerves (electrodiagnostic testing).  Stimulating your sensory nerves through electrodes on your skin and measuring the response in your spinal cord and brain (somatosensory evoked potentials).  How is this treated? Treatment for neuropathic pain may change over time. You may need to try different treatment options or a combination of treatments. Some options include:  Over-the-counter pain relievers.  Prescription medicines. Some medicines   used to treat other conditions may also help neuropathic pain. These include medicines to: ? Control seizures (anticonvulsants). ? Relieve depression (antidepressants).  Prescription-strength pain relievers (narcotics). These are usually used when other pain relievers do not help.  Transcutaneous nerve stimulation (TENS).  This uses electrical currents to block painful nerve signals. The treatment is painless.  Topical and local anesthetics. These are medicines that numb the nerves. They can be injected as a nerve block or applied to the skin.  Alternative treatments, such as: ? Acupuncture. ? Meditation. ? Massage. ? Physical therapy. ? Pain management programs. ? Counseling.  Follow these instructions at home:  Learn as much as you can about your condition.  Take medicines only as directed by your health care provider.  Work closely with all your health care providers to find what works best for you.  Have a good support system at home.  Consider joining a chronic pain support group. Contact a health care provider if:  Your pain treatments are not helping.  You are having side effects from your medicines.  You are struggling with fatigue, mood changes, depression, or anxiety. This information is not intended to replace advice given to you by your health care provider. Make sure you discuss any questions you have with your health care provider. Document Released: 07/31/2004 Document Revised: 05/23/2016 Document Reviewed: 04/13/2014 Elsevier Interactive Patient Education  2018 [place].  Focal Neuropathy Focal neuropathy is a nerve injury that affects one area of the body, such as an arm, a leg, or the face. The injury may involve one nerve or a small group of nerves. Examples of focal neuropathy include brachial plexus injury and [place]arson[place]-Turner syndrome. What are the causes? This condition may be caused by:  A sudden cut or stretch. This can happen because of an accident or during surgery.  A small injury that happens again and again.  A compression injury. This kind of injury happens when a blood vessel, a tumor, or something else presses on a nerve for a long time.  Entrapment. This can happen to a nerve that has to pass through a narrow area.  Lack of blood to the nerves. This  can be caused by certain medical conditions, like diabetes or vasculitis.  Extreme cold.  Severe burns.  Radiation.  What are the signs or symptoms? Symptoms of this condition depend on where the damaged nerve is and what kind of nerve it is. For example, damage to nerves that carry signals away from the brain can cause symptoms related to movement, but damage to nerves that carry signals to the brain can cause symptoms related to feeling and pain. Symptoms affect only one area of the body. Common symptoms include:  Numbness.  Tingling.  A burning pain.  A prickling feeling.  Very sensitive skin.  Weakness.  Paralysis.  Muscle twitching.  Muscles getting smaller (muscle wasting).  Poor coordination.  How is this diagnosed? This condition may be diagnosed with:  A neurologic exam. During the exam, your health care provider will check your reflexes, how you move, and what you can feel.  Tests. Tests may be done to help find the area that has been damaged. Tests may include: ? Imaging tests, such as a CT scan or MRI. ? Electromyogram (EMG). This test checks the nerves that control your muscles. ? Nerve conduction velocity (NCV) tests. These tests check how quickly messages pass through your nerves.  How is this treated? Treatment for this condition depends on what damaged  the nerve and how much of the nerve is damaged. Treatment may involve:  Surgery to ease pressure on a nerve. This may be done if you have a compression injury or entrapment.  Medicines, such as: ? Medicines for pain, such painkillers, certain anti-seizure medicines, or antidepressants. ? Steroid medicines. These may be given in pill form or with a shot (injection).  Physical therapy (PT) to help movement.  Splints or other devices to help movement.  Follow these instructions at home:   Take over-the-counter and prescription medicines only as told by your health care provider.  If you were  given a splint or other device to help with movement, use it as told by your health care provider.  If any part of your body is numb, check it every day for signs of injury or infection. Watch for: ? Redness, swelling, or pain. ? Fluid or blood. ? Warmth. ? Pus or a bad smell.  Do not do things that put pressure on your damaged nerve. You may have to avoid certain activities or avoid sitting or lying in a certain way.  Do not smoke. Smoking keeps blood from getting to damaged nerves. If you need help quitting, ask your health care provider.  Limit alcohol intake, or avoid alcohol completely. Too much alcohol can cause a lack of B vitamins, which are needed for healthy nerves.  Have a good support system. Coping with focal neuropathy can be stressful. Talk with a mental health caregiver or join a support group if you are struggling.  Keep all follow-up visits as told by your health care provider. This is important. These include PT visits. Contact a health care provider if:  You think you have an injury or infection.  You have new symptoms.  You are struggling emotionally from dealing with your condition. Get help right away if:  You have severe pain that comes on suddenly.  You develop any paralysis.  You lose feeling in any part of your body. This information is not intended to replace advice given to you by your health care provider. Make sure you discuss any questions you have with your health care provider. Document Released: 10/15/2015 Document Revised: 04/10/2016 Document Reviewed: 06/01/2015 Elsevier Interactive Patient Education  [employer].

## 2018-06-25 NOTE — Progress Notes (Signed)
Assessment & Plan:  Wanda Garcia was seen today for follow-up.  Diagnoses and all orders for this visit:  Bilateral hand pain -     ANA -     Ambulatory referral to Hand Surgery -     DG Hand Complete Left; Future -     DG Hand Complete Right; Future Bilateral wrist splints  Healthcare maintenance -     VITAMIN D 25 Hydroxy (Vit-D Deficiency, Fractures) -     CMP14+EGFR -     CBC -     Lipid panel    Patient has been counseled on age-appropriate routine health concerns for screening and prevention. These are reviewed and up-to-date. Referrals have been placed accordingly. Immunizations are up-to-date or declined.    Subjective:   Chief Complaint  Patient presents with  . Follow-up    Pt. is here to follow-up on the numbness on her hand. Pt. stated it is still the same.    HPI Wanda Garcia 42 y.o. female presents to office today for f/u of bilateral hand numbness with wrist pain.  Bilateral hand and Wrist Pain Chronic. Patient complaints of bilateral hand and wrist pain with numbness in her wrists. She denies any  personal injury. The pain began 2 years ago. The pain is located primarily in the bilateral wrists with radiating pain into both forearms.  She describes the symptoms as sharp, shooting, stabbing, tingling and radiating. Symptoms improve with nothing. The symptoms are worse with flexion, extension, movement and pressure on injured area. The patient  does not have neck pain. She states she was treated for tendinitis in the past but does not recall the medication she was given to treat this. She is still breastfeeding so I have only been able to prescribe her voltaren gel and have been limited with prescribing additional po pain medications. ANA negative.     Review of Systems  Constitutional: Negative for fever, malaise/fatigue and weight loss.  HENT: Negative.  Negative for nosebleeds.   Eyes: Negative.  Negative for blurred vision, double vision and photophobia.    Respiratory: Negative.  Negative for cough and shortness of breath.   Cardiovascular: Negative.  Negative for chest pain, palpitations and leg swelling.  Gastrointestinal: Negative.  Negative for heartburn, nausea and vomiting.  Musculoskeletal: Positive for joint pain. Negative for myalgias.       SEE HPI  Neurological: Positive for tingling and sensory change. Negative for dizziness, focal weakness, seizures and headaches.       SEE HPI  Psychiatric/Behavioral: Negative.  Negative for suicidal ideas.    Past Medical History:  Diagnosis Date  . Medical history non-contributory     Past Surgical History:  Procedure Laterality Date  . CESAREAN SECTION N/A 04/06/2017   Procedure: CESAREAN SECTION;  Surgeon: Osborne Oman, MD;  Location: [facility];  Service: Obstetrics;  Laterality: N/A;  . NO PAST SURGERIES      History reviewed. No pertinent family history.  Social History Reviewed with no changes to be made today.   Outpatient Medications Prior to Visit  Medication Sig Dispense Refill  . diclofenac sodium (VOLTAREN) 1 % GEL Apply 4 g topically 4 (four) times daily. 100 g 1  . acetaminophen (TYLENOL) 325 MG tablet Take 650 mg by mouth every 6 (six) hours as needed for mild pain, moderate pain or headache.     . Multiple Vitamins-Minerals (WOMENS ONE DAILY PO) Take 1 each by mouth daily.     No facility-administered medications prior  to visit.     No Known Allergies     Objective:    BP 102/67 (BP Location: Left Arm, Patient Position: Sitting, Cuff Size: Normal)   Pulse 78   Temp 99.1 F (37.3 C) (Oral)   Ht _0  (1.651 m)   Wt 159 lb 12.8 oz (72.5 kg)   LMP 06/07/2018   SpO2 96%   BMI 26.59 kg/m  Wt Readings from Last 3 Encounters:  06/25/18 159 lb 12.8 oz (72.5 kg)  04/23/18 161 lb (73 kg)  05/30/17 146 lb (66.2 kg)    Physical Exam  Constitutional: She is oriented to person, place, and time. She appears well-developed and well-nourished. She is  cooperative.  HENT:  Head: Normocephalic and atraumatic.  Cardiovascular: Normal rate, regular rhythm and normal heart sounds. Exam reveals no gallop and no friction rub.  No murmur heard. Pulmonary/Chest: Effort normal and breath sounds normal. No tachypnea. No respiratory distress. She has no decreased breath sounds. She has no wheezes. She has no rhonchi. She has no rales. She exhibits no tenderness.  Abdominal: Soft. Bowel sounds are normal.  Musculoskeletal: Normal range of motion. She exhibits no edema.       Right hand: She exhibits normal range of motion, normal capillary refill and no swelling. Normal sensation noted. Normal strength noted.       Left hand: She exhibits normal range of motion, normal capillary refill and no swelling. Normal sensation noted. Normal strength noted.  Neurological: She is alert and oriented to person, place, and time. She has normal strength. Coordination normal.  Skin: Skin is warm and dry.  Psychiatric: She has a normal mood and affect. Her behavior is normal. Judgment and thought content normal.  Nursing note and vitals reviewed.      Patient has been counseled extensively about nutrition and exercise as well as the importance of adherence with medications and regular follow-up. The patient was given clear instructions to go to ER or return to medical center if symptoms don't improve, worsen or new problems develop. The patient verbalized understanding.   Follow-up: Return if symptoms worsen or fail to improve.   Gildardo Pounds, FNP-BC [facility], [place]   07/03/2018, 11:15 PM

## 2018-06-26 LAB — CMP14+EGFR
ALT: 10 IU/L (ref 0–32)
AST: 12 IU/L (ref 0–40)
Albumin/Globulin Ratio: 1.9 (ref 1.2–2.2)
Albumin: 4.6 g/dL (ref 3.5–5.5)
Alkaline Phosphatase: 71 IU/L (ref 39–117)
BUN/Creatinine Ratio: 18 (ref 9–23)
BUN: 10 mg/dL (ref 6–24)
Bilirubin Total: <0.2 mg/dL (ref 0.0–1.2)
CALCIUM: 9.5 mg/dL (ref 8.7–10.2)
CO2: 23 mmol/L (ref 20–29)
CREATININE: 0.57 mg/dL (ref 0.57–1.00)
Chloride: 106 mmol/L (ref 96–106)
GFR calc Af Amer: 132 mL/min/{1.73_m2} (ref >59)
GFR, EST NON AFRICAN AMERICAN: 115 mL/min/{1.73_m2} (ref >59)
Globulin, Total: 2.4 g/dL (ref 1.5–4.5)
Glucose: 104 mg/dL — ABNORMAL HIGH (ref 65–99)
Potassium: 4.6 mmol/L (ref 3.5–5.2)
Sodium: 142 mmol/L (ref 134–144)
Total Protein: 7 g/dL (ref 6.0–8.5)

## 2018-06-26 LAB — LIPID PANEL
CHOL/HDL RATIO: 2.5 ratio (ref 0.0–4.4)
Cholesterol, Total: 144 mg/dL (ref 100–199)
HDL: 57 mg/dL (ref >39)
LDL CALC: 76 mg/dL (ref 0–99)
Triglycerides: 54 mg/dL (ref 0–149)
VLDL CHOLESTEROL CAL: 11 mg/dL (ref 5–40)

## 2018-06-26 LAB — CBC
HEMATOCRIT: 43.3 % (ref 34.0–46.6)
HEMOGLOBIN: 12.7 g/dL (ref 11.1–15.9)
MCH: 23.2 pg — ABNORMAL LOW (ref 26.6–33.0)
MCHC: 29.3 g/dL — ABNORMAL LOW (ref 31.5–35.7)
MCV: 79 fL (ref 79–97)
Platelets: 369 10*3/uL (ref 150–450)
RBC: 5.47 x10E6/uL — AB (ref 3.77–5.28)
RDW: 14.4 % (ref 12.3–15.4)
WBC: 7.9 10*3/uL (ref 3.4–10.8)

## 2018-06-26 LAB — VITAMIN D 25 HYDROXY (VIT D DEFICIENCY, FRACTURES): Vit D, 25-Hydroxy: 16.4 ng/mL — ABNORMAL LOW (ref 30.0–100.0)

## 2018-06-26 LAB — ANA: ANA: NEGATIVE

## 2018-06-29 NOTE — Telephone Encounter (Signed)
Patient has been approved for [facility]FA 06/18/18-12/19/18 and also the [facility]

## 2018-06-29 NOTE — Telephone Encounter (Signed)
Will route to PCP 

## 2018-07-03 ENCOUNTER — Encounter: Payer: Self-pay | Admitting: Nurse Practitioner

## 2018-07-03 MED ORDER — VITAMIN D (ERGOCALCIFEROL) 1.25 MG (50000 UNIT) PO CAPS: 50000.0000 [IU] | ORAL_CAPSULE | ORAL | 1 refills | Status: DC

## 2018-07-05 ENCOUNTER — Telehealth: Payer: Self-pay

## 2018-07-05 MED FILL — VIT D2 1.25 MG (50,000 UNIT: 1.25 MG | 28 days supply | Qty: 4 | Fill #0

## 2018-07-05 NOTE — Telephone Encounter (Signed)
Referrals have been made 

## 2018-07-05 NOTE — Telephone Encounter (Signed)
-----   Message from Gildardo Pounds, NP sent at 07/03/2018 11:27 PM EDT ----- Please let patient know x rays have been ordered. She can go to [facility] and have xrays performed on her hands. Vit D is low. Will send prescription to pharmacy. ANA is negative for any connective tissue disorders of the hands. All other labs are normal.

## 2018-07-05 NOTE — Telephone Encounter (Signed)
CMA attempt to call patient to inform on lab results.  No answer and left a VM for patient to call back.  If patient call back, please inform:  Please let patient know x rays have been ordered. She can go to [facility] and have xrays performed on her hands. Vit D is low. Will send prescription to pharmacy. ANA is negative for any connective tissue disorders of the hands. All other labs are normal.          A letter will be send out to reach patient.

## 2018-07-09 ENCOUNTER — Ambulatory Visit (INDEPENDENT_AMBULATORY_CARE_PROVIDER_SITE_OTHER): Payer: Self-pay | Admitting: Orthopaedic Surgery

## 2018-07-09 ENCOUNTER — Ambulatory Visit (INDEPENDENT_AMBULATORY_CARE_PROVIDER_SITE_OTHER): Payer: Self-pay

## 2018-07-09 ENCOUNTER — Encounter (INDEPENDENT_AMBULATORY_CARE_PROVIDER_SITE_OTHER): Payer: Self-pay | Admitting: Orthopaedic Surgery

## 2018-07-09 DIAGNOSIS — M25531 Pain in right wrist: Secondary | ICD-10-CM | POA: Insufficient documentation

## 2018-07-09 DIAGNOSIS — M65312 Trigger thumb, left thumb: Secondary | ICD-10-CM

## 2018-07-09 DIAGNOSIS — M542 Cervicalgia: Secondary | ICD-10-CM

## 2018-07-09 DIAGNOSIS — M25532 Pain in left wrist: Secondary | ICD-10-CM

## 2018-07-09 NOTE — Progress Notes (Signed)
Office Visit Note   Patient: Wanda Garcia           Date of Birth: 1976/06/29           MRN: [id number] Visit Date: 07/09/2018              Requested by: Gildardo Pounds, NP [place], [place] [address] PCP: Gildardo Pounds, NP   Assessment & Plan: Visit Diagnoses:  1. Neck pain   2. Trigger finger of left thumb   3. Pain in left wrist   4. Pain in right wrist     Plan: Impression is bilateral carpal tunnel syndrome versus cervical spine radiculopathy.  #2, left trigger thumb.  We will go ahead and obtain nerve conduction studies of both upper extremities to further assess this.  She will follow-up with [place] once that has been completed.  In regards to the left thumb, I discussed cortisone injection.  She would like to put this off for now.  Follow-Up Instructions: Return in about 10 days (around 07/19/2018).   Orders:  Orders Placed This Encounter  Procedures  . XR Cervical Spine 2 or 3 views  . Ambulatory referral to Physical Medicine Rehab   No orders of the defined types were placed in this encounter.     Procedures: No procedures performed   Clinical Data: No additional findings.   Subjective: Chief Complaint  Patient presents with  . Left Hand - Pain  . Right Hand - Pain    HPI patient is a pleasant 42 year old Arabic speaking female who comes in today with an interpreter.  She is here with bilateral hand pain, numbness and tingling.  Left hand is worse than the right.  This is been ongoing for the past 2 years without any known injury or change in activity.  The pain she has goes from her hand all the way up to the lateral aspect of her neck.  She describes this as a constant pain worse with any movement of the hands especially if she is lifting something moderately heavy.  She has been taking ibuprofen without relief of symptoms.  She is also tried wrist splints without any relief.  Of note, she is several months postpartum and is currently  breast-feeding her son.  She thinks she had a cortisone injection to the left wrist about 3 years ago but is unsure.  She also admits to moderate pain and triggering to the left thumb.  Worse in the morning.  She does have pain trying to open things.  Review of Systems as detailed in HPI.  All others reviewed and are negative.   Objective: Vital Signs: There were no vitals taken for this visit.  Physical Exam well-developed well-nourished female in no acute distress.  Alert and oriented x3.  Ortho Exam examination of both upper extremities reveal negative Tinel at the elbow.  Positive Tinel left wrist negative Tinel on the right.  Positive Phalen both sides.  No thenar atrophy.  Decreased sensation median nerve distribution.  She does have slight stiffness with range of motion of the neck, no spinous or paraspinous tenderness.  Examination of the left thumb reveals marked tenderness and a small palpable nodule at the A1 pulley.  She does not exhibit active triggering on today's exam.  Specialty Comments:  No specialty comments available.  Imaging: Xr Cervical Spine 2 Or 3 Views  Result Date: 07/09/2018 Abnormal straightening of the cervical spine    PMFS History: Patient Active  Problem List   Diagnosis Date Noted  . Neck pain 07/09/2018  . Trigger finger of left thumb 07/09/2018  . Pain in left wrist 07/09/2018  . Pain in right wrist 07/09/2018  . S/P cesarean section 04/05/2017  . Advanced maternal age, primigravida in first trimester, antepartum    Past Medical History:  Diagnosis Date  . Medical history non-contributory     History reviewed. No pertinent family history.  Past Surgical History:  Procedure Laterality Date  . CESAREAN SECTION N/A 04/06/2017   Procedure: CESAREAN SECTION;  Surgeon: Osborne Oman, MD;  Location: [facility];  Service: Obstetrics;  Laterality: N/A;  . NO PAST SURGERIES     Social History   Occupational History  . Not on file    Tobacco Use  . Smoking status: Never Smoker  . Smokeless tobacco: Never Used  Substance and Sexual Activity  . Alcohol use: No  . Drug use: No  . Sexual activity: Yes    Birth control/protection: None

## 2018-07-15 ENCOUNTER — Encounter (HOSPITAL_COMMUNITY): Payer: Self-pay

## 2018-07-15 ENCOUNTER — Ambulatory Visit
Admission: RE | Admit: 2018-07-15 | Discharge: 2018-07-15 | Disposition: A | Discharge status: Home / Self Care | Payer: No Typology Code available for payment source | Source: Ambulatory Visit | Attending: Obstetrics and Gynecology | Admitting: Obstetrics and Gynecology

## 2018-07-15 ENCOUNTER — Ambulatory Visit (HOSPITAL_COMMUNITY)
Admission: RE | Admit: 2018-07-15 | Discharge: 2018-07-15 | Disposition: A | Discharge status: Home / Self Care | Payer: Self-pay | Source: Ambulatory Visit | Attending: Obstetrics and Gynecology | Admitting: Obstetrics and Gynecology

## 2018-07-15 ENCOUNTER — Other Ambulatory Visit: Payer: Self-pay

## 2018-07-15 VITALS — BP 112/74 | Wt 159.4 lb

## 2018-07-15 DIAGNOSIS — Z1239 Encounter for other screening for malignant neoplasm of breast: Secondary | ICD-10-CM

## 2018-07-15 DIAGNOSIS — N644 Mastodynia: Secondary | ICD-10-CM

## 2018-07-15 NOTE — Progress Notes (Signed)
Complaints of right breast pain and lump x 15 months that comes and goes. Patient rates the pain at a 6 out of 10.  Pap Smear: Pap smear not completed today. Last Pap smear was 09/11/2016 at the [facility] and normal. Per patient has no history of an abnormal Pap smear. Last Pap smear result is in Epic.  Physical exam: Breasts Breasts symmetrical. No skin abnormalities bilateral breasts. No nipple retraction bilateral breasts. Patient is currently breastfeeding. No lymphadenopathy. No lumps palpated bilateral breasts. Complaints of areolar tenderness on exam. Referred patient to the [facility] for a diagnostic mammogram and possible right breast ultrasound. Appointment scheduled for Thursday, July 15, 2018 at 1450.        Pelvic/Bimanual No Pap smear completed today since last Pap smear was 09/11/2016. Pap smear not indicated per BCCCP guidelines.   Smoking History: Patient has never smoked.  Patient Navigation: Patient education provided. Access to services provided for patient through [facility] program. Arabic interpreter provided.   Breast and Cervical Cancer Risk Assessment: Patient has no family history of breast cancer, known genetic mutations, or radiation treatment to the chest before age 83. Patient has no history of cervical dysplasia, immunocompromised, or DES exposure in-utero.  Risk Assessment    Risk Scores      07/15/2018   Last edited by: Armond Hang, LPN   5-year risk: 0.9 %   Lifetime risk: 13.4 %         Used Arabic interpreter [employer] from [place].

## 2018-07-15 NOTE — Patient Instructions (Signed)
Explained breast self awareness with Kyiah Rote. Patient did not need a Pap smear today due to last Pap smear was 09/11/2016. Let her know BCCCP will cover Pap smears every 3 years unless has a history of abnormal Pap smears. Referred patient to the [facility] for a diagnostic mammogram and possible right breast ultrasound. Appointment scheduled for Thursday, July 15, 2018 at 1450. Daleiza Chevez verbalized understanding.  Timoty Bourke, Arvil Chaco, RN 2:14 PM

## 2018-07-16 ENCOUNTER — Encounter (HOSPITAL_COMMUNITY): Payer: Self-pay | Admitting: *Deleted

## 2018-07-23 ENCOUNTER — Encounter (INDEPENDENT_AMBULATORY_CARE_PROVIDER_SITE_OTHER): Payer: Self-pay | Admitting: Physical Medicine and Rehabilitation

## 2018-07-23 ENCOUNTER — Ambulatory Visit (INDEPENDENT_AMBULATORY_CARE_PROVIDER_SITE_OTHER): Payer: Self-pay | Admitting: Physical Medicine and Rehabilitation

## 2018-07-23 DIAGNOSIS — M542 Cervicalgia: Secondary | ICD-10-CM

## 2018-07-23 DIAGNOSIS — M545 Low back pain, unspecified: Secondary | ICD-10-CM

## 2018-07-23 DIAGNOSIS — R202 Paresthesia of skin: Secondary | ICD-10-CM

## 2018-07-23 DIAGNOSIS — M25532 Pain in left wrist: Secondary | ICD-10-CM

## 2018-07-23 DIAGNOSIS — S39012A Strain of muscle, fascia and tendon of lower back, initial encounter: Secondary | ICD-10-CM

## 2018-07-23 DIAGNOSIS — M25531 Pain in right wrist: Secondary | ICD-10-CM

## 2018-07-23 DIAGNOSIS — M5412 Radiculopathy, cervical region: Secondary | ICD-10-CM

## 2018-07-23 NOTE — Progress Notes (Signed)
 .  Numeric Pain Rating Scale and Functional Assessment Average Pain 8   In the last MONTH (on 0-10 scale) has pain interfered with the following?  1. General activity like being  able to carry out your everyday physical activities such as walking, climbing stairs, carrying groceries, or moving a chair?  Rating(8)   

## 2018-07-26 NOTE — Procedures (Signed)
EMG & NCV Findings: All nerve conduction studies (as indicated in the following tables) were within normal limits.    All examined muscles (as indicated in the following table) showed no evidence of electrical instability.    Impression: Essentially NORMAL electrodiagnostic study of both upper limbs.  The test itself was very interesting in the fact that even with a mild stimulation that we produced during the study the patient had a very uncommon reaction with tears and concern.  With continued reassurance that there was nothing from the test that was injuring the patient she did proceed with the test fairly well.  The needle EMG portion also was very problematic and everything was increasingly more sensitive to her.  There is no significant electrodiagnostic evidence of nerve entrapment, brachial plexopathy or cervical radiculopathy.    As you know, purely sensory or demyelinating radiculopathies and chemical radiculitis may not be detected with this particular electrodiagnostic study.  ** This electrodiagnostic study cannot rule out small fiber polyneuropathy and dysesthesias from central pain sensitization syndromes such as fibromyalgia.  Myotomal referral pain from trigger points is also not excluded.   Recommendations: 1.  Follow-up with referring physician. 2.  Continue current management of symptoms.  ___________________________ Laurence Spates FAAPMR Board Certified, American Board of Physical Medicine and Rehabilitation    Nerve Conduction Studies Anti Sensory Summary Table   Stim Site NR Peak (ms) Norm Peak (ms) P-T Amp (V) Norm P-T Amp Site1 Site2 Delta-P (ms) Dist (cm) Vel (m/s) Norm Vel (m/s)  Left Median Acr Palm Anti Sensory (2nd Digit)  30.8C  Wrist    2.8 <3.6 66.8 >10 Wrist Palm 1.1 0.0    Palm    1.7 <2.0 92.0         Right Median Acr Palm Anti Sensory (2nd Digit)  32.7C  Wrist    2.9 <3.6 58.3 >10 Wrist Palm 1.3 0.0    Palm    1.6 <2.0 67.6         Right Radial  Anti Sensory (Base 1st Digit)  31.3C  Wrist    2.0 <3.1 28.5  Wrist Base 1st Digit 2.0 0.0    Right Ulnar Anti Sensory (5th Digit)  32C  Wrist    3.0 <3.7 49.0 >15.0 Wrist 5th Digit 3.0 14.0 47 >38   Motor Summary Table   Stim Site NR Onset (ms) Norm Onset (ms) O-P Amp (mV) Norm O-P Amp Site1 Site2 Delta-0 (ms) Dist (cm) Vel (m/s) Norm Vel (m/s)  Right Median Motor (Abd Poll Brev)  31.2C  Wrist    2.7 <4.2 5.9 >5 Elbow Wrist 3.2 19.0 59 >50  Elbow    5.9  5.7         Right Ulnar Motor (Abd Dig Min)  31.3C  Wrist    2.7 <4.2 9.8 >3 B Elbow Wrist 2.5 17.0 68 >53  B Elbow    5.2  9.5  A Elbow B Elbow 1.1 10.0 91 >53  A Elbow    6.3  9.3          EMG   Side Muscle Nerve Root Ins Act Fibs Psw Amp Dur Poly Recrt Int Fraser Din Comment  Right Abd Poll Brev Median C8-T1 Nml Nml Nml Nml Nml 0 Nml Nml   Right 1stDorInt Ulnar C8-T1 Nml Nml Nml Nml Nml 0 Nml Nml   Right PronatorTeres Median C6-7 Nml Nml Nml Nml Nml 0 Nml Nml   Right Biceps Musculocut C5-6 Nml Nml Nml Nml Nml 0 Nml  Nml   Right Deltoid Axillary C5-6 Nml Nml Nml Nml Nml 0 Nml Nml     Nerve Conduction Studies Anti Sensory Left/Right Comparison   Stim Site L Lat (ms) R Lat (ms) L-R Lat (ms) L Amp (V) R Amp (V) L-R Amp (%) Site1 Site2 L Vel (m/s) R Vel (m/s) L-R Vel (m/s)  Median Acr Palm Anti Sensory (2nd Digit)  30.8C  Wrist 2.8 2.9 0.1 66.8 58.3 12.7 Wrist Palm     Palm 1.7 1.6 0.1 92.0 67.6 26.5       Radial Anti Sensory (Base 1st Digit)  31.3C  Wrist  2.0   28.5  Wrist Base 1st Digit     Ulnar Anti Sensory (5th Digit)  32C  Wrist  3.0   49.0  Wrist 5th Digit  47    Motor Left/Right Comparison   Stim Site L Lat (ms) R Lat (ms) L-R Lat (ms) L Amp (mV) R Amp (mV) L-R Amp (%) Site1 Site2 L Vel (m/s) R Vel (m/s) L-R Vel (m/s)  Median Motor (Abd Poll Brev)  31.2C  Wrist  2.7   5.9  Elbow Wrist  59   Elbow  5.9   5.7        Ulnar Motor (Abd Dig Min)  31.3C  Wrist  2.7   9.8  B Elbow Wrist  68   B Elbow  5.2   9.5  A  Elbow B Elbow  91   A Elbow  6.3   9.3           Waveforms:

## 2018-07-27 NOTE — Progress Notes (Signed)
Jetaun Vandermeulen - 42 y.o. female MRN [id number]  Date of birth: 1976-04-06  Office Visit Note: Visit Date: 07/23/2018 PCP: Gildardo Pounds, NP Referred by: Gildardo Pounds, NP  Subjective: Chief Complaint  Patient presents with  . Neck - Pain  . Right Arm - Pain  . Left Arm - Pain  . Right Hand - Pain  . Left Hand - Pain   HPI: Mrs. Klos is a 42 year old right-hand-dominant female who comes in today with her husband who acts as interpreter.  She comes in today at the request of Tawanna Cooler, PA-C for electrodiagnostic study of both upper limbs.  She reports approximately 1 year of burning neck pain that refers to both arms and both hands in a nondermatomal fashion into all of her fingers.  She reports that any activity makes it worse especially trying to hold objects.  She reports resting and doing nothing seems to help a little.  She reports that the left hand is somewhat worse than the right.  She reports fairly constant symptoms overall.  She rates her pain as an 8 out of 10.  She has tried wrist splints without any relief.  She recently had a child several months ago does use her hands a lot with him.  She has not had any steroid injections of the wrists or hands.  She reports some triggering of the left thumb.  She has not had prior electrodiagnostic studies.  She has had no specific injury.   She also reports acute onset of back pain yesterday on the right side.  She reports lifting a heavy object and feeling fairly acute pain.  This pain is worse with prolonged sitting and activity.  She is getting some pain to the anterior thigh.  She reports no focal weakness.  She reports no change in bowel or bladder symptoms.  As an aside we suggested ice to the area and active rest of the symptoms are not getting better she should have this evaluated by Dr. Erlinda Hong on return follow-up or if the symptoms worsen or go down the legs she can see the emergency department.  Brief exam today showed  good strength.   Review of Systems  Constitutional: Positive for malaise/fatigue. Negative for chills, fever and weight loss.  HENT: Negative for hearing loss and sinus pain.   Eyes: Negative for blurred vision, double vision and photophobia.  Respiratory: Negative for cough and shortness of breath.   Cardiovascular: Negative for chest pain, palpitations and leg swelling.  Gastrointestinal: Negative for abdominal pain, nausea and vomiting.  Genitourinary: Negative for flank pain.  Musculoskeletal: Positive for back pain, joint pain and neck pain. Negative for myalgias.  Skin: Negative for itching and rash.  Neurological: Positive for tingling. Negative for tremors, focal weakness and weakness.  Endo/Heme/Allergies: Negative.   Psychiatric/Behavioral: Negative for depression.  All other systems reviewed and are negative.  Otherwise per HPI.  Assessment & Plan: Visit Diagnoses:  1. Paresthesia of skin   2. Pain in left wrist   3. Pain in right wrist   4. Cervicalgia   5. Cervical radiculopathy   6. Acute right-sided low back pain without sciatica   7. Strain of lumbar region, initial encounter     Plan: Impression: Essentially NORMAL electrodiagnostic study of both upper limbs.  The test itself was very interesting in the fact that even with a mild stimulation that we produced during the study the patient had a very uncommon reaction with tears and concern.  With continued reassurance that there was nothing from the test that was injuring the patient she did proceed with the test fairly well.  The needle EMG portion also was very problematic and everything was increasingly more sensitive to her.  There is no significant electrodiagnostic evidence of nerve entrapment, brachial plexopathy or cervical radiculopathy.    As you know, purely sensory or demyelinating radiculopathies and chemical radiculitis may not be detected with this particular electrodiagnostic study.  ** This  electrodiagnostic study cannot rule out small fiber polyneuropathy and dysesthesias from central pain sensitization syndromes such as fibromyalgia.  Myotomal referral pain from trigger points is also not excluded.   Recommendations: 1.  Follow-up with referring physician.  2.  Continue current management of symptoms, cervical MRI if this is really felt to be cervical but clinically I think this is more of a myofascial or fibromyalgia syndrome.  Could consider referral to neurology.    Meds & Orders: No orders of the defined types were placed in this encounter.   Orders Placed This Encounter  Procedures  . NCV with EMG (electromyography)    Follow-up: Return for Eduard Roux, MD/Lindsey Venida Jarvis, [place].   Procedures: No procedures performed  EMG & NCV Findings: All nerve conduction studies (as indicated in the following tables) were within normal limits.    All examined muscles (as indicated in the following table) showed no evidence of electrical instability.    Impression: Essentially NORMAL electrodiagnostic study of both upper limbs.  The test itself was very interesting in the fact that even with a mild stimulation that we produced during the study the patient had a very uncommon reaction with tears and concern.  With continued reassurance that there was nothing from the test that was injuring the patient she did proceed with the test fairly well.  The needle EMG portion also was very problematic and everything was increasingly more sensitive to her.  There is no significant electrodiagnostic evidence of nerve entrapment, brachial plexopathy or cervical radiculopathy.    As you know, purely sensory or demyelinating radiculopathies and chemical radiculitis may not be detected with this particular electrodiagnostic study.  ** This electrodiagnostic study cannot rule out small fiber polyneuropathy and dysesthesias from central pain sensitization syndromes such as fibromyalgia.  Myotomal  referral pain from trigger points is also not excluded.   Recommendations: 1.  Follow-up with referring physician. 2.  Continue current management of symptoms.  ___________________________ Laurence Spates FAAPMR Board Certified, American Board of Physical Medicine and Rehabilitation    Nerve Conduction Studies Anti Sensory Summary Table   Stim Site NR Peak (ms) Norm Peak (ms) P-T Amp (V) Norm P-T Amp Site1 Site2 Delta-P (ms) Dist (cm) Vel (m/s) Norm Vel (m/s)  Left Median Acr Palm Anti Sensory (2nd Digit)  30.8C  Wrist    2.8 <3.6 66.8 >10 Wrist Palm 1.1 0.0    Palm    1.7 <2.0 92.0         Right Median Acr Palm Anti Sensory (2nd Digit)  32.7C  Wrist    2.9 <3.6 58.3 >10 Wrist Palm 1.3 0.0    Palm    1.6 <2.0 67.6         Right Radial Anti Sensory (Base 1st Digit)  31.3C  Wrist    2.0 <3.1 28.5  Wrist Base 1st Digit 2.0 0.0    Right Ulnar Anti Sensory (5th Digit)  32C  Wrist    3.0 <3.7 49.0 >15.0 Wrist 5th Digit 3.0 14.0 47 >38  Motor Summary Table   Stim Site NR Onset (ms) Norm Onset (ms) O-P Amp (mV) Norm O-P Amp Site1 Site2 Delta-0 (ms) Dist (cm) Vel (m/s) Norm Vel (m/s)  Right Median Motor (Abd Poll Brev)  31.2C  Wrist    2.7 <4.2 5.9 >5 Elbow Wrist 3.2 19.0 59 >50  Elbow    5.9  5.7         Right Ulnar Motor (Abd Dig Min)  31.3C  Wrist    2.7 <4.2 9.8 >3 B Elbow Wrist 2.5 17.0 68 >53  B Elbow    5.2  9.5  A Elbow B Elbow 1.1 10.0 91 >53  A Elbow    6.3  9.3          EMG   Side Muscle Nerve Root Ins Act Fibs Psw Amp Dur Poly Recrt Int Fraser Din Comment  Right Abd Poll Brev Median C8-T1 Nml Nml Nml Nml Nml 0 Nml Nml   Right 1stDorInt Ulnar C8-T1 Nml Nml Nml Nml Nml 0 Nml Nml   Right PronatorTeres Median C6-7 Nml Nml Nml Nml Nml 0 Nml Nml   Right Biceps Musculocut C5-6 Nml Nml Nml Nml Nml 0 Nml Nml   Right Deltoid Axillary C5-6 Nml Nml Nml Nml Nml 0 Nml Nml     Nerve Conduction Studies Anti Sensory Left/Right Comparison   Stim Site L Lat (ms) R Lat (ms) L-R Lat  (ms) L Amp (V) R Amp (V) L-R Amp (%) Site1 Site2 L Vel (m/s) R Vel (m/s) L-R Vel (m/s)  Median Acr Palm Anti Sensory (2nd Digit)  30.8C  Wrist 2.8 2.9 0.1 66.8 58.3 12.7 Wrist Palm     Palm 1.7 1.6 0.1 92.0 67.6 26.5       Radial Anti Sensory (Base 1st Digit)  31.3C  Wrist  2.0   28.5  Wrist Base 1st Digit     Ulnar Anti Sensory (5th Digit)  32C  Wrist  3.0   49.0  Wrist 5th Digit  47    Motor Left/Right Comparison   Stim Site L Lat (ms) R Lat (ms) L-R Lat (ms) L Amp (mV) R Amp (mV) L-R Amp (%) Site1 Site2 L Vel (m/s) R Vel (m/s) L-R Vel (m/s)  Median Motor (Abd Poll Brev)  31.2C  Wrist  2.7   5.9  Elbow Wrist  59   Elbow  5.9   5.7        Ulnar Motor (Abd Dig Min)  31.3C  Wrist  2.7   9.8  B Elbow Wrist  68   B Elbow  5.2   9.5  A Elbow B Elbow  91   A Elbow  6.3   9.3           Waveforms:             Clinical History: No specialty comments available.   She reports that she has never smoked. She has never used smokeless tobacco. No results for input(s): HGBA1C, LABURIC in the last 8760 hours.  Objective:  VS:  HT:    WT:   BMI:     BP:   HR: bpm  TEMP: ( )  RESP:  Physical Exam  Constitutional: She is oriented to person, place, and time. She appears well-developed and well-nourished.  Eyes: Pupils are equal, round, and reactive to light. Conjunctivae and EOM are normal.  Cardiovascular: Normal rate and intact distal pulses.  Pulmonary/Chest: Effort normal.  Musculoskeletal:  Inspection reveals no atrophy of  the bilateral APB or FDI or hand intrinsics. There is no swelling, color changes, allodynia or dystrophic changes. There is 5 out of 5 strength in the bilateral wrist extension, finger abduction and long finger flexion, effort was limited. There is intact sensation to light touch in all dermatomal and peripheral nerve distributions. T There is a negative Tinel's test at the bilateral wrist and elbow. There is a negative Phalen's test bilaterally.  There is  pain involved with any movement of the arms or palpation for the special tests.  There is no reproductive f symptoms however.  There is a negative Hoffmann's test bilaterally.  Patient ambulates with slight antalgic gait to the left.  No pain with hip rotation.  Good distal lower extremity strength.  Neurological: She is alert and oriented to person, place, and time. She exhibits normal muscle tone.  Skin: Skin is warm and dry. No rash noted. No erythema.  Psychiatric: She has a normal mood and affect. Her behavior is normal.  Nursing note and vitals reviewed.   Ortho Exam Imaging: No results found.  Past Medical/Family/Surgical/Social History: Medications & Allergies reviewed per EMR, new medications updated. Patient Active Problem List   Diagnosis Date Noted  . Neck pain 07/09/2018  . Trigger finger of left thumb 07/09/2018  . Pain in left wrist 07/09/2018  . Pain in right wrist 07/09/2018  . S/P cesarean section 04/05/2017  . Advanced maternal age, primigravida in first trimester, antepartum    Past Medical History:  Diagnosis Date  . Medical history non-contributory    Family History  Problem Relation Age of Onset  . Diabetes Father   . Hypertension Father   . Breast cancer Neg Hx    Past Surgical History:  Procedure Laterality Date  . CESAREAN SECTION N/A 04/06/2017   Procedure: CESAREAN SECTION;  Surgeon: Osborne Oman, MD;  Location: [facility];  Service: Obstetrics;  Laterality: N/A;  . NO PAST SURGERIES     Social History   Occupational History  . Not on file  Tobacco Use  . Smoking status: Never Smoker  . Smokeless tobacco: Never Used  Substance and Sexual Activity  . Alcohol use: No  . Drug use: No  . Sexual activity: Yes    Birth control/protection: None

## 2018-07-28 ENCOUNTER — Ambulatory Visit (INDEPENDENT_AMBULATORY_CARE_PROVIDER_SITE_OTHER): Payer: Self-pay | Admitting: Orthopaedic Surgery

## 2018-07-28 DIAGNOSIS — M25531 Pain in right wrist: Secondary | ICD-10-CM

## 2018-07-28 DIAGNOSIS — M542 Cervicalgia: Secondary | ICD-10-CM

## 2018-07-28 DIAGNOSIS — M25532 Pain in left wrist: Secondary | ICD-10-CM

## 2018-07-28 MED ORDER — PREDNISONE 10 MG (21) PO TBPK: ORAL_TABLET | ORAL | 0 refills | Status: DC

## 2018-07-28 NOTE — Progress Notes (Signed)
   Office Visit Note   Patient: Wanda Garcia           Date of Birth: 06-19-1976           MRN: [id number] Visit Date: 07/28/2018              Requested by: Gildardo Pounds, NP [place], [place] [address] PCP: Gildardo Pounds, NP   Assessment & Plan: Visit Diagnoses:  1. Pain in left wrist   2. Pain in right wrist   3. Neck pain   4. Cervicalgia     Plan: At this point we will need to obtain MRI of her cervical spine to evaluate for structural abnormalities causing her bilateral upper extremity paresthesias.  Today's encounter was performed through an interpreter which did increase complexity.  Follow-Up Instructions: Return in about 2 weeks (around 08/11/2018).   Orders:  Orders Placed This Encounter  Procedures  . MR Cervical Spine w/o contrast   Meds ordered this encounter  Medications  . predniSONE (STERAPRED UNI-PAK 21 TAB) 10 MG (21) TBPK tablet    Sig: Take as directed    Dispense:  21 tablet    Refill:  0      Procedures: No procedures performed   Clinical Data: No additional findings.   Subjective: Chief Complaint  Patient presents with  . Right Wrist - Follow-up    EMG/ NCS Review  . Left Wrist - Follow-up    EMG/ NCS Review    Patient follows up today for nerve conduction studies.  These were reported as normal.   Review of Systems   Objective: Vital Signs: LMP 07/07/2018 (Approximate)   Physical Exam  Ortho Exam Bilateral hand exam and upper extremity exam are stable. Specialty Comments:  No specialty comments available.  Imaging: No results found.   PMFS History: Patient Active Problem List   Diagnosis Date Noted  . Neck pain 07/09/2018  . Trigger finger of left thumb 07/09/2018  . Pain in left wrist 07/09/2018  . Pain in right wrist 07/09/2018  . S/P cesarean section 04/05/2017  . Advanced maternal age, primigravida in first trimester, antepartum    Past Medical History:  Diagnosis Date  . Medical  history non-contributory     Family History  Problem Relation Age of Onset  . Diabetes Father   . Hypertension Father   . Breast cancer Neg Hx     Past Surgical History:  Procedure Laterality Date  . CESAREAN SECTION N/A 04/06/2017   Procedure: CESAREAN SECTION;  Surgeon: Osborne Oman, MD;  Location: [facility];  Service: Obstetrics;  Laterality: N/A;  . NO PAST SURGERIES     Social History   Occupational History  . Not on file  Tobacco Use  . Smoking status: Never Smoker  . Smokeless tobacco: Never Used  Substance and Sexual Activity  . Alcohol use: No  . Drug use: No  . Sexual activity: Yes    Birth control/protection: None

## 2018-08-11 ENCOUNTER — Ambulatory Visit (INDEPENDENT_AMBULATORY_CARE_PROVIDER_SITE_OTHER): Payer: Self-pay | Admitting: Orthopaedic Surgery

## 2018-08-13 ENCOUNTER — Ambulatory Visit (HOSPITAL_COMMUNITY)
Admission: RE | Admit: 2018-08-13 | Discharge: 2018-08-13 | Disposition: A | Discharge status: Home / Self Care | Payer: No Typology Code available for payment source | Source: Ambulatory Visit | Attending: Orthopaedic Surgery | Admitting: Orthopaedic Surgery

## 2018-08-13 DIAGNOSIS — M47812 Spondylosis without myelopathy or radiculopathy, cervical region: Secondary | ICD-10-CM | POA: Insufficient documentation

## 2018-08-13 DIAGNOSIS — M542 Cervicalgia: Secondary | ICD-10-CM | POA: Insufficient documentation

## 2018-09-03 ENCOUNTER — Ambulatory Visit (INDEPENDENT_AMBULATORY_CARE_PROVIDER_SITE_OTHER): Payer: Self-pay | Admitting: Orthopaedic Surgery

## 2018-09-13 ENCOUNTER — Telehealth (INDEPENDENT_AMBULATORY_CARE_PROVIDER_SITE_OTHER): Payer: Self-pay | Admitting: Orthopaedic Surgery

## 2018-09-13 NOTE — Telephone Encounter (Signed)
Looks like prednisone was sent in since September. Okay to resend again?

## 2018-09-13 NOTE — Telephone Encounter (Signed)
Prednisone needs to be sent to [facility], [place].  # [phone]

## 2018-09-16 NOTE — Telephone Encounter (Signed)
Patients husband states she never received Rx for prednisone. This Rx was sent to pharm multiple times. Patient is scheduled to come in tomorrow. Advised if  Mendel Ryder still wanted to prescribe prednisone (since patient never took Rx)  than she can give her a printed Rx so that they can take to pharm.  FYI Only.

## 2018-09-16 NOTE — Telephone Encounter (Signed)
No refill on prednisone, but can give mobic 7.5 qd prn #30

## 2018-09-17 ENCOUNTER — Ambulatory Visit (INDEPENDENT_AMBULATORY_CARE_PROVIDER_SITE_OTHER): Payer: Self-pay | Admitting: Physician Assistant

## 2018-09-17 ENCOUNTER — Encounter (INDEPENDENT_AMBULATORY_CARE_PROVIDER_SITE_OTHER): Payer: Self-pay | Admitting: Physician Assistant

## 2018-09-17 DIAGNOSIS — M542 Cervicalgia: Secondary | ICD-10-CM

## 2018-09-17 MED ORDER — PREDNISONE 10 MG (21) PO TBPK: ORAL_TABLET | ORAL | 0 refills | Status: DC

## 2018-09-17 NOTE — Progress Notes (Signed)
      Patient: Wanda Garcia           Date of Birth: 1976/07/23           MRN: [id number] Visit Date: 09/17/2018 PCP: Gildardo Pounds, NP   Assessment & Plan:  Chief Complaint:  Chief Complaint  Patient presents with  . Neck - Pain, Follow-up   Visit Diagnoses:  1. Neck pain     Plan: Patient is a pleasant 42 year old female who presents to our clinic today to discuss MRI results of her cervical spine.  MRI results of the cervical spine show very mild to minimal disc bulging from C3-C7.  No specific area of nerve encroachment.  Nerve conduction studies previously obtained were negative.  The patient continues to have bilateral hand pain and numbness left greater than right.  She did not take the Sterapred taper that was originally prescribed as this was evidently sent to the wrong pharmacy.  At this point, we will resend the Sterapred taper and send the patient to formal physical therapy.  A prescription was given to the patient today for therapy.  She will follow-up with [place] as needed.  Follow-Up Instructions: Return if symptoms worsen or fail to improve.   Orders:  No orders of the defined types were placed in this encounter.  Meds ordered this encounter  Medications  . predniSONE (STERAPRED UNI-PAK 21 TAB) 10 MG (21) TBPK tablet    Sig: Take as directed    Dispense:  21 tablet    Refill:  0    Imaging: No new imaging  PMFS History: Patient Active Problem List   Diagnosis Date Noted  . Neck pain 07/09/2018  . Trigger finger of left thumb 07/09/2018  . Pain in left wrist 07/09/2018  . Pain in right wrist 07/09/2018  . S/P cesarean section 04/05/2017  . Advanced maternal age, primigravida in first trimester, antepartum    Past Medical History:  Diagnosis Date  . Medical history non-contributory     Family History  Problem Relation Age of Onset  . Diabetes Father   . Hypertension Father   . Breast cancer Neg Hx     Past Surgical History:  Procedure  Laterality Date  . CESAREAN SECTION N/A 04/06/2017   Procedure: CESAREAN SECTION;  Surgeon: Osborne Oman, MD;  Location: [facility];  Service: Obstetrics;  Laterality: N/A;  . NO PAST SURGERIES     Social History   Occupational History  . Not on file  Tobacco Use  . Smoking status: Never Smoker  . Smokeless tobacco: Never Used  Substance and Sexual Activity  . Alcohol use: No  . Drug use: No  . Sexual activity: Yes    Birth control/protection: None

## 2018-10-18 ENCOUNTER — Telehealth (INDEPENDENT_AMBULATORY_CARE_PROVIDER_SITE_OTHER): Payer: Self-pay | Admitting: Orthopaedic Surgery

## 2018-10-18 ENCOUNTER — Other Ambulatory Visit (INDEPENDENT_AMBULATORY_CARE_PROVIDER_SITE_OTHER): Payer: Self-pay

## 2018-10-18 MED ORDER — PREDNISONE 10 MG (21) PO TBPK: ORAL_TABLET | ORAL | 0 refills | Status: DC

## 2018-10-18 MED FILL — predniSONE 10 MG TABS: 10 | 6 days supply | Qty: 21 | Fill #0

## 2018-10-18 NOTE — Telephone Encounter (Signed)
[facility]

## 2018-10-18 NOTE — Telephone Encounter (Signed)
Ganer(husband)stated that medication Predisone need to be sent to [facility],  Please call patient to advise

## 2018-10-29 MED FILL — VIT D2 1.25 MG (50,000 UNIT: 1.25 MG | 28 days supply | Qty: 4 | Fill #1

## 2018-10-29 MED FILL — predniSONE 10 MG TABS: 10 | 6 days supply | Qty: 21 | Fill #0

## 2018-11-03 ENCOUNTER — Encounter: Payer: Self-pay | Admitting: Nurse Practitioner

## 2018-11-03 ENCOUNTER — Ambulatory Visit: Payer: Self-pay | Attending: Nurse Practitioner | Admitting: Nurse Practitioner

## 2018-11-03 ENCOUNTER — Other Ambulatory Visit: Payer: Self-pay

## 2018-11-03 VITALS — BP 131/83 | HR 81 | Temp 99.1°F | Ht 65.0 in | Wt 167.4 lb

## 2018-11-03 DIAGNOSIS — Z3169 Encounter for other general counseling and advice on procreation: Secondary | ICD-10-CM | POA: Insufficient documentation

## 2018-11-03 DIAGNOSIS — Z833 Family history of diabetes mellitus: Secondary | ICD-10-CM | POA: Insufficient documentation

## 2018-11-03 DIAGNOSIS — N946 Dysmenorrhea, unspecified: Secondary | ICD-10-CM | POA: Insufficient documentation

## 2018-11-03 NOTE — Patient Instructions (Signed)
Female Infertility  Female infertility refers to a woman's inability to get pregnant (conceive) after a year of having sex regularly (or after 6 months in women over age 43) without using birth control. Infertility can also mean that a woman is not able to carry a pregnancy to full term. Both women and men can have fertility problems. What are the causes? This condition may be caused by:  Problems with reproductive organs. Infertility can result if a woman: ? Has an abnormally short cervix or a cervix that does not remain closed during a pregnancy. ? Has a blockage or scarring in the fallopian tubes. ? Has an abnormally shaped uterus. ? Has uterine fibroids. This is a benign mass of tissue or muscle (tumor) that can develop in the uterus. ? Is not ovulating in a regular way.  Certain medical conditions. These may include: ? Polycystic ovary syndrome (PCOS). This is a hormonal disorder that can cause small cysts to grow on the ovaries. This is the most common cause of infertility in women. ? Endometriosis. This is a condition in which the tissue that lines the uterus (endometrium) grows outside of its normal location. ? Cancer and cancer treatments, such as chemotherapy or radiation. ? Premature ovarian failure. This is when ovaries stop producing eggs and hormones before age 43. ? Sexually transmitted diseases, such as chlamydia or gonorrhea. ? Autoimmune disorders. These are disorders in which the body's defense system (immune system) attacks normal, healthy cells. Infertility can be linked to more than one cause. For some women, the cause of infertility is not known (unexplained infertility). What increases the risk?  Age. A woman's fertility declines with age, especially after her mid-62s.  Being underweight or overweight.  Drinking too much alcohol.  Using drugs such as anabolic steroids, cocaine, and marijuana.  Exercising excessively.  Being exposed to environmental toxins,  such as radiation, pesticides, and certain chemicals. What are the signs or symptoms? The main sign of infertility in women is the inability to get pregnant or carry a pregnancy to full term. How is this diagnosed? This condition may be diagnosed by:  Checking whether you are ovulating each month. The tests may include: ? Blood tests to check hormone levels. ? An ultrasound of the ovaries. ? Taking a small tissue that lines the uterus and checking it under a microscope (endometrial biopsy).  Doing additional tests. This is done if ovulation is normal. Tests may include: ? Hysterosalpingography. This X-ray test can show the shape of the uterus and whether the fallopian tubes are open. ? Laparoscopy. This test uses a lighted tube (laparoscope) to look for problems in the fallopian tubes and other organs. ? Transvaginal ultrasound. This imaging test is used to check for abnormalities in the uterus and ovaries. ? Hysteroscopy. This test uses a lighted tube to check for problems in the cervix and the uterus. To be diagnosed with infertility, both partners will have a physical exam. Both partners will also have an extensive medical and sexual history taken. Additional tests may be done. How is this treated? Treatment depends on the cause of infertility. Most cases of infertility in women are treated with medicine or surgery.  Women may take medicine to: ? Correct ovulation problems. ? Treat other health conditions.  Surgery may be done to: ? Repair damage to the ovaries, fallopian tubes, cervix, or uterus. ? Remove growths from the uterus. ? Remove scar tissue from the uterus, pelvis, or other organs. Assisted reproductive technology (ART) Assisted reproductive technology (  from the uterus, pelvis, or other organs.  Assisted reproductive technology (ART)  Assisted reproductive technology (ART) refers to all treatments and procedures that combine eggs and sperm outside the body to try to help a couple conceive. ART is often combined with fertility drugs to stimulate ovulation. Sometimes ART is done using eggs retrieved from another woman's body (donor eggs) or from previously  frozen fertilized eggs (embryos).  There are different types of ART. These include:  Intrauterine insemination (IUI). A long, thin tube is used to place sperm directly into a woman's uterus. This procedure:  Is effective for infertility caused by sperm problems, including low sperm count and low motility.  Can be used in combination with fertility drugs.  In vitro fertilization (IVF). This is done when a woman's fallopian tubes are blocked or when a man has low sperm count. In this procedure:  Fertility drugs are used to stimulate the ovaries to produce multiple eggs.  Once mature, these eggs are removed from the body and combined with the sperm to be fertilized.  The fertilized eggs are then placed into the woman's uterus.  Follow these instructions at home:  Take over-the-counter and prescription medicines only as told by your health care provider.  Do not use any products that contain nicotine or tobacco, such as cigarettes and e-cigarettes. If you need help quitting, ask your health care provider.  If you drink alcohol, limit how much you have to 1 drink a day.  Make dietary changes to lose weight or maintain a healthy weight. Work with your health care provider and a dietitian to set a weight-loss goal that is healthy and reasonable for you.  Seek support from a counselor or support group to talk about your concerns related to infertility. Couples counseling may be helpful for you and your partner.  Practice stress reduction techniques that work well for you, such as regular physical activity, meditation, or deep breathing.  Keep all follow-up visits as told by your health care provider. This is important.  Contact a health care provider if you:  Feel that stress is interfering with your life and relationships.  Have side effects from treatments for infertility.  Summary  Female infertility refers to a woman's inability to get pregnant (conceive) after a year of having sex regularly (or after 6 months in women  over age 35) without using birth control.  To be diagnosed with infertility, both partners will have a physical exam. Both partners will also have an extensive medical and sexual history taken.  Seek support from a counselor or support group to talk about your concerns related to infertility. Couples counseling may be helpful for you and your partner.  This information is not intended to replace advice given to you by your health care provider. Make sure you discuss any questions you have with your health care provider.  Document Released: 11/06/2003 Document Revised: 10/05/2017 Document Reviewed: 10/05/2017  Elsevier Interactive Patient Education © 2019 [employer]  •

## 2018-11-03 NOTE — Progress Notes (Signed)
Assessment & Plan:  [name] was seen today for trying to conceive.  Diagnoses and all orders for this visit:  Dysmenorrhea -     US PELVIS (TRANSABDOMINAL ONLY); Future -     US PELVIS TRANSVANGINAL NON-OB (TV ONLY); Future  Infertility counseling -     TSH    Patient has been counseled on age-appropriate routine health concerns for screening and prevention. These are reviewed and up-to-date. Referrals have been placed accordingly. Immunizations are up-to-date or declined.    Subjective:   Chief Complaint  Patient presents with  . Trying to conceive   HPI Wanda Garcia 42 y.o. female presents to office today wanting "to make sure there is nothing preventing her from getting pregnant". She has been trying to conceive since after the birth of her first child over a year and a half ago. She has one son. She has not been timing her ovulation cycles. She endorses dysmenorrhea but denies menorrhagia or metrorrhagia. Her menstrual cycles are regular and occurring every 22 days.  She denies any blood clots or heavy bleeding. We discussed AMA and the likelihood of fertility decreasing with age. Will obtain US due to dysmenorrhea to rule out fibroids. She is aware she will need to speak with a fertility specialist or gynecologist OOP regarding any future fertility testing if her [place] is normal.    Review of Systems  Constitutional: Negative for fever, malaise/fatigue and weight loss.  HENT: Negative.  Negative for nosebleeds.   Eyes: Negative.  Negative for blurred vision, double vision and photophobia.  Respiratory: Negative.  Negative for cough and shortness of breath.   Cardiovascular: Negative.  Negative for chest pain, palpitations and leg swelling.  Gastrointestinal: Negative.  Negative for heartburn, nausea and vomiting.  Musculoskeletal: Negative.  Negative for myalgias.  Neurological: Negative.  Negative for dizziness, focal weakness, seizures and headaches.    Psychiatric/Behavioral: Negative.  Negative for suicidal ideas.    Past Medical History:  Diagnosis Date  . Medical history non-contributory     Past Surgical History:  Procedure Laterality Date  . CESAREAN SECTION N/A 04/06/2017   Procedure: CESAREAN SECTION;  Surgeon: Osborne Oman, MD;  Location: [facility];  Service: Obstetrics;  Laterality: N/A;  . NO PAST SURGERIES      Family History  Problem Relation Age of Onset  . Diabetes Father   . Hypertension Father   . Breast cancer Neg Hx     Social History Reviewed with no changes to be made today.   Outpatient Medications Prior to Visit  Medication Sig Dispense Refill  . acetaminophen (TYLENOL) 325 MG tablet Take 650 mg by mouth every 6 (six) hours as needed for mild pain, moderate pain or headache.     . diclofenac sodium (VOLTAREN) 1 % GEL Apply 4 g topically 4 (four) times daily. (Patient not taking: Reported on 11/03/2018) 100 g 1  . Multiple Vitamins-Minerals (WOMENS ONE DAILY PO) Take 1 each by mouth daily.    . predniSONE (STERAPRED UNI-PAK 21 TAB) 10 MG (21) TBPK tablet Take as directed (Patient not taking: Reported on 11/03/2018) 21 tablet 0  . Vitamin D, Ergocalciferol, (DRISDOL) 50000 units CAPS capsule Take 1 capsule (50,000 Units total) by mouth every 7 (seven) days. (Patient not taking: Reported on 11/03/2018) 12 capsule 1   No facility-administered medications prior to visit.     No Known Allergies     Objective:    BP 131/83   Pulse 81   Temp 99.1  F (37.3 C) (Oral)   Ht 5\' 5"  (1.651 m)   Wt 167 lb 6.4 oz (75.9 kg)   LMP 10/02/2018 (Exact Date)   SpO2 100%   BMI 27.86 kg/m  Wt Readings from Last 3 Encounters:  11/03/18 167 lb 6.4 oz (75.9 kg)  07/15/18 159 lb 6.4 oz (72.3 kg)  06/25/18 159 lb 12.8 oz (72.5 kg)    Physical Exam Vitals signs and nursing note reviewed.  Constitutional:      Appearance: She is well-developed.  HENT:     Head: Normocephalic and atraumatic.  Neck:      Musculoskeletal: Normal range of motion.  Cardiovascular:     Rate and Rhythm: Normal rate and regular rhythm.     Heart sounds: Normal heart sounds. No murmur. No friction rub. No gallop.   Pulmonary:     Effort: Pulmonary effort is normal. No tachypnea or respiratory distress.     Breath sounds: Normal breath sounds. No decreased breath sounds, wheezing, rhonchi or rales.  Chest:     Chest wall: No tenderness.  Abdominal:     General: Bowel sounds are normal.     Palpations: Abdomen is soft.  Musculoskeletal: Normal range of motion.  Skin:    General: Skin is warm and dry.  Neurological:     Mental Status: She is alert and oriented to person, place, and time.     Coordination: Coordination normal.  Psychiatric:        Behavior: Behavior normal. Behavior is cooperative.        Thought Content: Thought content normal.        Judgment: Judgment normal.          Patient has been counseled extensively about nutrition and exercise as well as the importance of adherence with medications and regular follow-up. The patient was given clear instructions to go to ER or return to medical center if symptoms don't improve, worsen or new problems develop. The patient verbalized understanding.   Follow-up: No follow-ups on file.   Gildardo Pounds, FNP-BC [facility], [place]   11/03/2018, 3:46 PM

## 2018-11-04 LAB — TSH: TSH: 1.83 u[IU]/mL (ref 0.450–4.500)

## 2018-11-11 ENCOUNTER — Ambulatory Visit (HOSPITAL_COMMUNITY): Payer: No Typology Code available for payment source

## 2018-11-12 ENCOUNTER — Encounter (INDEPENDENT_AMBULATORY_CARE_PROVIDER_SITE_OTHER): Payer: Self-pay

## 2019-03-23 ENCOUNTER — Other Ambulatory Visit: Payer: Self-pay

## 2019-03-23 ENCOUNTER — Other Ambulatory Visit (HOSPITAL_COMMUNITY): Payer: Self-pay

## 2019-03-23 ENCOUNTER — Emergency Department (HOSPITAL_COMMUNITY)
Admission: EM | Admit: 2019-03-23 | Discharge: 2019-03-23 | Disposition: A | Discharge status: Home / Self Care | Payer: Self-pay | Attending: Emergency Medicine | Admitting: Emergency Medicine

## 2019-03-23 ENCOUNTER — Encounter (HOSPITAL_COMMUNITY): Payer: Self-pay | Admitting: Emergency Medicine

## 2019-03-23 ENCOUNTER — Emergency Department (HOSPITAL_COMMUNITY): Payer: Self-pay

## 2019-03-23 DIAGNOSIS — R112 Nausea with vomiting, unspecified: Secondary | ICD-10-CM | POA: Insufficient documentation

## 2019-03-23 DIAGNOSIS — R1013 Epigastric pain: Secondary | ICD-10-CM

## 2019-03-23 DIAGNOSIS — K8020 Calculus of gallbladder without cholecystitis without obstruction: Secondary | ICD-10-CM | POA: Insufficient documentation

## 2019-03-23 DIAGNOSIS — R10811 Right upper quadrant abdominal tenderness: Secondary | ICD-10-CM | POA: Insufficient documentation

## 2019-03-23 LAB — CBC WITH DIFFERENTIAL/PLATELET
Abs Immature Granulocytes: 0.01 10*3/uL (ref 0.00–0.07)
Basophils Absolute: 0 10*3/uL (ref 0.0–0.1)
Basophils Relative: 1 %
Eosinophils Absolute: 0.1 10*3/uL (ref 0.0–0.5)
Eosinophils Relative: 2 %
HCT: 39.8 % (ref 36.0–46.0)
Hemoglobin: 12.7 g/dL (ref 12.0–15.0)
Immature Granulocytes: 0 %
Lymphocytes Relative: 39 %
Lymphs Abs: 2.7 10*3/uL (ref 0.7–4.0)
MCH: 25 pg — ABNORMAL LOW (ref 26.0–34.0)
MCHC: 31.9 g/dL (ref 30.0–36.0)
MCV: 78.3 fL — ABNORMAL LOW (ref 80.0–100.0)
Monocytes Absolute: 0.4 10*3/uL (ref 0.1–1.0)
Monocytes Relative: 6 %
Neutro Abs: 3.7 10*3/uL (ref 1.7–7.7)
Neutrophils Relative %: 52 %
Platelets: 340 10*3/uL (ref 150–400)
RBC: 5.08 MIL/uL (ref 3.87–5.11)
RDW: 14.4 % (ref 11.5–15.5)
WBC: 7 10*3/uL (ref 4.0–10.5)
nRBC: 0 % (ref 0.0–0.2)

## 2019-03-23 LAB — COMPREHENSIVE METABOLIC PANEL
ALT: 55 U/L — ABNORMAL HIGH (ref 0–44)
AST: 37 U/L (ref 15–41)
Albumin: 3.9 g/dL (ref 3.5–5.0)
Alkaline Phosphatase: 53 U/L (ref 38–126)
Anion gap: 7 (ref 5–15)
BUN: 9 mg/dL (ref 6–20)
CO2: 22 mmol/L (ref 22–32)
Calcium: 9 mg/dL (ref 8.9–10.3)
Chloride: 108 mmol/L (ref 98–111)
Creatinine, Ser: 0.62 mg/dL (ref 0.44–1.00)
GFR calc Af Amer: >60 mL/min (ref >60)
GFR calc non Af Amer: >60 mL/min (ref >60)
Glucose, Bld: 105 mg/dL — ABNORMAL HIGH (ref 70–99)
Potassium: 5.2 mmol/L — ABNORMAL HIGH (ref 3.5–5.1)
Sodium: 137 mmol/L (ref 135–145)
Total Bilirubin: 1 mg/dL (ref 0.3–1.2)
Total Protein: 6.7 g/dL (ref 6.5–8.1)

## 2019-03-23 LAB — URINALYSIS, ROUTINE W REFLEX MICROSCOPIC
Bilirubin Urine: NEGATIVE
Glucose, UA: NEGATIVE mg/dL
Hgb urine dipstick: NEGATIVE
Ketones, ur: NEGATIVE mg/dL
Leukocytes,Ua: NEGATIVE
Nitrite: NEGATIVE
Protein, ur: NEGATIVE mg/dL
Specific Gravity, Urine: 1.009 (ref 1.005–1.030)
pH: 8 (ref 5.0–8.0)

## 2019-03-23 LAB — LIPASE, BLOOD: Lipase: 44 U/L (ref 11–51)

## 2019-03-23 LAB — PREGNANCY, URINE: Preg Test, Ur: NEGATIVE

## 2019-03-23 MED ORDER — FAMOTIDINE IN NACL 20-0.9 MG/50ML-% IV SOLN
20.0000 mg | Freq: Once | INTRAVENOUS | Status: AC
  Administered 2019-03-23: 20 mg via INTRAVENOUS
  Filled 2019-03-23: qty 50

## 2019-03-23 MED ORDER — HYDROCODONE-ACETAMINOPHEN 5-325 MG PO TABS: 1.0000 | ORAL_TABLET | Freq: Four times a day (QID) | ORAL | 0 refills | Status: DC | PRN

## 2019-03-23 MED ORDER — ONDANSETRON 4 MG PO TBDP: 4.0000 mg | ORAL_TABLET | Freq: Three times a day (TID) | ORAL | 0 refills | Status: DC | PRN

## 2019-03-23 MED ORDER — MORPHINE SULFATE (PF) 4 MG/ML IV SOLN
4.0000 mg | Freq: Once | INTRAVENOUS | Status: AC
  Administered 2019-03-23: 4 mg via INTRAVENOUS
  Filled 2019-03-23: qty 1

## 2019-03-23 MED ORDER — ONDANSETRON HCL 4 MG/2ML IJ SOLN
4.0000 mg | Freq: Once | INTRAMUSCULAR | Status: AC
  Administered 2019-03-23: 16:00:00 4 mg via INTRAVENOUS
  Filled 2019-03-23: qty 2

## 2019-03-23 NOTE — ED Provider Notes (Signed)
[facility] EMERGENCY DEPARTMENT Provider Note   CSN: [id number] Arrival date & time: 03/23/19  1443    History   Chief Complaint Chief Complaint  Patient presents with   Abdominal Pain    HPI Icess Reading is a 43 y.o. female is here for evaluation of abdominal pain x 4 days, worsening.  Described as burning and stabbing.  Located to the epigastrium with radiation to the center of the chest, right breast, right back and right shoulder.  Initially pain was intermittent and worse after eating but this morning it became constant. Constant since 4 am this morning. Has noticed the pain is worse 4 to 6 hours after eating.  She is on day 13 of ramadan, for the last 4 days she breaks fast around 8 PM and eats and then wakes up at around 4 AM with pain.  Has had associated increased belching, regurgitation, nausea and nonbilious nonbloody vomiting.  She feels better after she vomits.  She took a gas relief medicine which also helped.  Currently her pain is mild and has some nausea.  Breathing, eating and palpation makes it worse.  Occasionally takes 2 ibuprofen at nighttime for body pains.  Some constipation in setting of decreased PO intake.  History of C-sections but no other abdominal surgeries.  The pain begins in the epigastrium and radiates into her chest but she has no frank chest pain or shortness of breath with exertion, distal paresthesias or weakness.  No fever, hematemesis, coffee ground emesis, diarrhea, dysuria. Unsure about melena. No h/o gastritis, ulcers, GIB. No heavy aspirin, ETOH use. No tobacco use.  HPI  Past Medical History:  Diagnosis Date   Medical history non-contributory     Patient Active Problem List   Diagnosis Date Noted   Neck pain 07/09/2018   Trigger finger of left thumb 07/09/2018   Pain in left wrist 07/09/2018   Pain in right wrist 07/09/2018   S/P cesarean section 04/05/2017   Advanced maternal age, primigravida in first  trimester, antepartum     Past Surgical History:  Procedure Laterality Date   CESAREAN SECTION N/A 04/06/2017   Procedure: CESAREAN SECTION;  Surgeon: Osborne Oman, MD;  Location: [facility];  Service: Obstetrics;  Laterality: N/A;   NO PAST SURGERIES       OB History    Gravida  1   Para  1   Term  1   Preterm      AB      Living  1     SAB      TAB      Ectopic      Multiple  0   Live Births  1            Home Medications    Prior to Admission medications   Medication Sig Start Date End Date Taking? Authorizing Provider  acetaminophen (TYLENOL) 325 MG tablet Take 650 mg by mouth every 6 (six) hours as needed for mild pain, moderate pain or headache.     [provider]  diclofenac sodium (VOLTAREN) 1 % GEL Apply 4 g topically 4 (four) times daily. Patient not taking: Reported on 11/03/2018 04/23/18   Gildardo Pounds, NP  Multiple Vitamins-Minerals (WOMENS ONE DAILY PO) Take 1 each by mouth daily.    [provider]  Vitamin D, Ergocalciferol, (DRISDOL) 50000 units CAPS capsule Take 1 capsule (50,000 Units total) by mouth every 7 (seven) days. Patient not taking: Reported on  11/03/2018 07/03/18   Gildardo Pounds, NP    Family History Family History  Problem Relation Age of Onset   Diabetes Father    Hypertension Father    Breast cancer Neg Hx     Social History Social History   Tobacco Use   Smoking status: Never Smoker   Smokeless tobacco: Never Used  Substance Use Topics   Alcohol use: No   Drug use: No     Allergies   Patient has no known allergies.   Review of Systems Review of Systems  Gastrointestinal: Positive for abdominal pain, nausea and vomiting.  All other systems reviewed and are negative.    Physical Exam Updated Vital Signs BP 124/74    Pulse 77    Temp 99 F (37.2 C) (Oral)    Resp 18    Ht 5' (1.524 m)    Wt 75.9 kg    LMP  (Within Weeks)    SpO2 99%    BMI 32.68 kg/m    Physical Exam Vitals signs and nursing note reviewed.  Constitutional:      Appearance: She is well-developed.     Comments: Non toxic in NAD  HENT:     Head: Normocephalic and atraumatic.     Nose: Nose normal.  Eyes:     Conjunctiva/sclera: Conjunctivae normal.  Neck:     Musculoskeletal: Normal range of motion.  Cardiovascular:     Rate and Rhythm: Normal rate and regular rhythm.     Comments: 1+ radial and DP pulses bilaterally.  Pulmonary:     Effort: Pulmonary effort is normal.     Breath sounds: Normal breath sounds.  Abdominal:     General: Bowel sounds are normal.     Palpations: Abdomen is soft.     Tenderness: There is abdominal tenderness in the right upper quadrant and epigastric area.     Comments: Mild epigastric and RUQ tenderness but no G/R/R. No distention. No suprapubic or CVA tenderness. Negative Murphy's and McBurney's. Active BS to lower quadrants. No pulsatility.   Musculoskeletal: Normal range of motion.  Skin:    General: Skin is warm and dry.     Capillary Refill: Capillary refill takes less than 2 seconds.  Neurological:     Mental Status: She is alert.     Comments: Sensation and strength intact in distal upper and lower extremities.   Psychiatric:        Behavior: Behavior normal.      ED Treatments / Results  Labs (all labs ordered are listed, but only abnormal results are displayed) Labs Reviewed  URINALYSIS, ROUTINE W REFLEX MICROSCOPIC - Abnormal; Notable for the following components:      Result Value   Color, Urine STRAW (*)    All other components within normal limits  URINE CULTURE  CBC WITH DIFFERENTIAL/PLATELET  COMPREHENSIVE METABOLIC PANEL  LIPASE, BLOOD    EKG EKG Interpretation  Date/Time:  Wednesday Mar 23 2019 15:59:12 EDT Ventricular Rate:  77 PR Interval:    QRS Duration: 81 QT Interval:  384 QTC Calculation: 435 R Axis:   43 Text Interpretation:  Sinus rhythm Probable anterior infarct, old no prior  available for comparison Confirmed by Quintella Reichert [phone]) on 03/23/2019 4:01:38 PM   Radiology No results found.  Procedures Procedures (including critical care time)  Medications Ordered in ED Medications  famotidine (PEPCID) IVPB 20 mg premix (has no administration in time range)  morphine 4 MG/ML injection 4 mg (4 mg Intravenous  Given 03/23/19 1618)  ondansetron (ZOFRAN) injection 4 mg (4 mg Intravenous Given 03/23/19 1618)     Initial Impression / Assessment and Plan / ED Course  I have reviewed the triage vital signs and the nursing notes.  Pertinent labs & imaging results that were available during my care of the patient were reviewed by me and considered in my medical decision making (see chart for details).       History and exam most suggestive of GI etiology.  DDX includes viral process, GERD, gastritis, PUD, cholelithiasis, cholecystitis. No risk factors for pancreatitis.  The pain originates in the epigastrium and radiates into the chest, right back and worse with meals which makes cardiac etiology less likely.  No pulse deficits, paresthesias and presentation is not typical of dissection. No BM changes or GU symptoms, low suspicion for SBO, ileus, UTI/pyelonephritis.  Will gets labs, RUQ [place], UA, treat symptoms and reassess. Screening EKG ordered given age, sex, and location of pain but ACS is unlikely based on history and reproducibility.  1620: EKG and UA unremarkable. Pt will be placed in progressive bed and handed off to [facility] who will reassess, f/u on results and determine disposition.  Consider discharge with diet changes, 40 mg omeprazole, 20 mg famotidine BID, carafate and GI f/u.  Final Clinical Impressions(s) / ED Diagnoses   Final diagnoses:  Epigastric abdominal pain    ED Discharge Orders    None       Arlean Hopping 03/23/19 1622    Quintella Reichert, MD 03/25/19 [phone]

## 2019-03-23 NOTE — ED Notes (Signed)
ED Provider at bedside. Interpreter service in use.

## 2019-03-23 NOTE — ED Notes (Signed)
Lab made aware of pregnancy, urine add on.

## 2019-03-23 NOTE — ED Triage Notes (Signed)
Patient  Presents with epigastric pain that occurs a few hours after eating. Reports nausea and vomiting X2, gastric. Reports relief with vomiting. Pain reported as burning sharp pain in right and mid upper abdomen. Reports feeling short of breath when pain comes. Denies chest pain. Husband Tamer at bedside to help with language barrier. Jake Bathe, MEd, RN CEN 03/23/2019 3:12 PM

## 2019-03-23 NOTE — ED Provider Notes (Signed)
Patient transfer to yellow zone, care assumed from Wanda Garcia.  See her note for full HPI and work-up.  Briefly, patient presenting to the emergency department with 4 days of right upper quadrant-epigastric abdominal pain radiating to her chest and back/shoulder.  Symptoms are worse after meals with associated nausea and vomiting.  Labs and right upper quadrant ultrasound ordered with suspicion for cholelithiasis versus gastritis/GERD.  Plan to follow-up labs and imaging and dispo accordingly. Physical Exam  BP 116/75   Pulse 61   Temp 99 F (37.2 C) (Oral)   Resp 11   Ht 5' (1.524 m)   Wt 75.9 kg   LMP  (Within Weeks)   SpO2 98%   BMI 32.68 kg/m   Physical Exam Vitals signs and nursing note reviewed.  Constitutional:      General: She is not in acute distress.    Appearance: She is well-developed.  HENT:     Head: Normocephalic and atraumatic.  Eyes:     Conjunctiva/sclera: Conjunctivae normal.  Cardiovascular:     Rate and Rhythm: Normal rate.  Pulmonary:     Effort: Pulmonary effort is normal.  Abdominal:     Palpations: Abdomen is soft.  Skin:    General: Skin is warm.  Neurological:     Mental Status: She is alert.  Psychiatric:        Behavior: Behavior normal.    Results for orders placed or performed during the hospital encounter of 03/23/19  CBC with Differential  Result Value Ref Range   WBC 7.0 4.0 - 10.5 K/uL   RBC 5.08 3.87 - 5.11 MIL/uL   Hemoglobin 12.7 12.0 - 15.0 g/dL   HCT 39.8 36.0 - 46.0 %   MCV 78.3 (L) 80.0 - 100.0 fL   MCH 25.0 (L) 26.0 - 34.0 pg   MCHC 31.9 30.0 - 36.0 g/dL   RDW 14.4 11.5 - 15.5 %   Platelets 340 150 - 400 K/uL   nRBC 0.0 0.0 - 0.2 %   Neutrophils Relative % 52 %   Neutro Abs 3.7 1.7 - 7.7 K/uL   Lymphocytes Relative 39 %   Lymphs Abs 2.7 0.7 - 4.0 K/uL   Monocytes Relative 6 %   Monocytes Absolute 0.4 0.1 - 1.0 K/uL   Eosinophils Relative 2 %   Eosinophils Absolute 0.1 0.0 - 0.5 K/uL   Basophils Relative 1 %    Basophils Absolute 0.0 0.0 - 0.1 K/uL   Immature Granulocytes 0 %   Abs Immature Granulocytes 0.01 0.00 - 0.07 K/uL  Comprehensive metabolic panel  Result Value Ref Range   Sodium 137 135 - 145 mmol/L   Potassium 5.2 (H) 3.5 - 5.1 mmol/L   Chloride 108 98 - 111 mmol/L   CO2 22 22 - 32 mmol/L   Glucose, Bld 105 (H) 70 - 99 mg/dL   BUN 9 6 - 20 mg/dL   Creatinine, Ser 0.62 0.44 - 1.00 mg/dL   Calcium 9.0 8.9 - 10.3 mg/dL   Total Protein 6.7 6.5 - 8.1 g/dL   Albumin 3.9 3.5 - 5.0 g/dL   AST 37 15 - 41 U/L   ALT 55 (H) 0 - 44 U/L   Alkaline Phosphatase 53 38 - 126 U/L   Total Bilirubin 1.0 0.3 - 1.2 mg/dL   GFR calc non Af Amer >60 >60 mL/min   GFR calc Af Amer >60 >60 mL/min   Anion gap 7 5 - 15  Lipase, blood  Result Value Ref  Range   Lipase 44 11 - 51 U/L  Urinalysis, Routine w reflex microscopic  Result Value Ref Range   Color, Urine STRAW (A) YELLOW   APPearance CLEAR CLEAR   Specific Gravity, Urine 1.009 1.005 - 1.030   pH 8.0 5.0 - 8.0   Glucose, UA NEGATIVE NEGATIVE mg/dL   Hgb urine dipstick NEGATIVE NEGATIVE   Bilirubin Urine NEGATIVE NEGATIVE   Ketones, ur NEGATIVE NEGATIVE mg/dL   Protein, ur NEGATIVE NEGATIVE mg/dL   Nitrite NEGATIVE NEGATIVE   Leukocytes,Ua NEGATIVE NEGATIVE  Pregnancy, urine  Result Value Ref Range   Preg Test, Ur NEGATIVE NEGATIVE   US Abdomen Limited Ruq  Result Date: 03/23/2019 CLINICAL DATA:  Right upper quadrant pain EXAM: ULTRASOUND ABDOMEN LIMITED RIGHT UPPER QUADRANT COMPARISON:  None. FINDINGS: Gallbladder: There is mild gallbladder wall thickening, measuring approximately 0.4 cm. Cholelithiasis is noted. The gallbladder is relatively contracted. The sonographic Murphy's sign is reported as negative. Common bile duct: Diameter: 0.6-0.7 cm. Liver: No focal lesion identified. Within normal limits in parenchymal echogenicity. Portal vein is patent on color Doppler imaging with normal direction of blood flow towards the liver. IMPRESSION: 1.  Cholelithiasis. 2. Mild gallbladder wall thickening which is likely secondary to underdistention in the absence of pericholecystic free fluid or a positive sonographic Murphy sign. 3. Common bile duct diameter at the upper limits of normal. Correlation with laboratory studies is recommended. Electronically Signed   By: Constance Holster M.D.   On: 03/23/2019 17:31    ED Course/Procedures     Procedures  MDM  Right upper quadrant ultrasound is consistent with cholelithiasis.  On reevaluation, patient reports significant improvement in symptoms.  Discussed diagnosis and treatment. Consult placed to general surgery.  Discussed patient's results and improvement after medications with surgeon.  At this time will discharge with outpatient follow-up.  Less likely cholecystitis or obstruction - afebrile, no leukocytosis, normal liver function.  Patient is agreeable to this plan.  Will discharge with pain medication and antiemetics.  Discussed diet modifications.  Outpatient referral provided for general surgery.  Strict return precautions discussed.  Patient is agreeable to plan and safe for discharge at this time.  Patient was discussed with Dr. Ralene Bathe.  Discussed results, findings, treatment and follow up. Patient advised of return precautions. Patient verbalized understanding and agreed with plan.        Tashika Goodin, [place] N, PA-C 03/23/19 1840    Quintella Reichert, MD 03/25/19 [phone]

## 2019-03-23 NOTE — Discharge Instructions (Signed)
Call the surgery office to schedule an appointment and discuss your treatment options. Take the hydrocodone every 6 hours as needed for severe pain.  Do not drive or drink alcohol while taking this medication. You can take Zofran/ondansetron every 8 hours as needed for nausea. Return to the emergency department if your pain becomes too severe and is not controlled by the medication prescribed, or if you develop fever or uncontrollable vomiting.

## 2019-03-23 NOTE — ED Notes (Signed)
Patient verbalizes understanding of discharge instructions. Opportunity for questioning and answers were provided. Armband removed by staff, pt discharged from ED. Pt ambulatory at discharge. RN confirmed husband is here to pick pt up. Follow up care, prescriptions/pharmacy discussed.

## 2019-03-23 NOTE — ED Notes (Signed)
Teamer [phone]

## 2019-03-23 NOTE — ED Notes (Addendum)
RN spoke with husband and gave update on pt status. Husband, Tamer, aware pt is currently in [place] and RN to notify pt that husband called upon completion of scan. Tamer (Husband): [phone]

## 2019-03-23 NOTE — ED Notes (Signed)
Pt in [place], [place] staff aware pt will be returning to room 39 upon completion of scan.

## 2019-03-24 ENCOUNTER — Telehealth: Payer: Self-pay | Admitting: *Deleted

## 2019-03-24 LAB — URINE CULTURE

## 2019-03-24 NOTE — Telephone Encounter (Signed)
Received call from pt's husband and states he was at [facility] and her Rx are coming up to 153.00 (Zofran $122). Contacted [facility] and provided goodrx for Hydrocodone  $10.61.   Call back to husband and states he picked up Zofran from [facility] for $10.00. He will go back to [facility] to pick up Hydrocodone.  Jonnie Finner RN CCM Case Mgmt phone [phone]

## 2019-03-28 MED FILL — VIT D2 1.25 MG (50,000 UNIT: 1.25 MG | 28 days supply | Qty: 4 | Fill #2

## 2019-04-08 ENCOUNTER — Ambulatory Visit: Payer: Self-pay | Attending: Nurse Practitioner | Admitting: Nurse Practitioner

## 2019-04-08 ENCOUNTER — Other Ambulatory Visit: Payer: Self-pay

## 2019-04-08 ENCOUNTER — Encounter: Payer: Self-pay | Admitting: Nurse Practitioner

## 2019-04-08 DIAGNOSIS — K8020 Calculus of gallbladder without cholecystitis without obstruction: Secondary | ICD-10-CM

## 2019-04-08 NOTE — Progress Notes (Signed)
Virtual Visit via Telephone Note Due to national recommendations of social distancing due to [place] 19, telehealth visit is felt to be most appropriate for this patient at this time.  I discussed the limitations, risks, security and privacy concerns of performing an evaluation and management service by telephone and the availability of in person appointments. I also discussed with the patient that there may be a patient responsible charge related to this service. The patient expressed understanding and agreed to proceed.    I connected with Keena Helfrich on 04/08/19  at   1:30 PM EDT  EDT by telephone and verified that I am speaking with the correct person using two identifiers.   Consent I discussed the limitations, risks, security and privacy concerns of performing an evaluation and management service by telephone and the availability of in person appointments. I also discussed with the patient that there may be a patient responsible charge related to this service. The patient expressed understanding and agreed to proceed.   Location of Patient: Private Residence   Location of Provider: [place] participating in Telemedicine visit: [name] FNP-BC [place]  Husband is translating for her today   History of Present Illness: Telemedicine visit for: Hospital Follow   Patient was seen in the emergency room on 03/23/2019 with complaints of right upper quadrant/epigastric abdominal pain radiating to her chest, back and shoulder.  Onset of symptoms was 4 days prior to the emergency room visit.  She endorsed worsening symptoms after meals associated with nausea and vomiting.  Right upper quadrant ultrasound showed cholelithiasis.  Surgery was consulted and recommended outpatient follow-up.  Per ED notes less likely cholecystitis or obstruction due to being afebrile, no leukocytosis, and normal liver function. Today she is  requesting a referral to [place] surgery. Abdominal pain has not improved however nausea and vomiting have.   Past Medical History:  Diagnosis Date  . Medical history non-contributory     Past Surgical History:  Procedure Laterality Date  . CESAREAN SECTION N/A 04/06/2017   Procedure: CESAREAN SECTION;  Surgeon: Osborne Oman, MD;  Location: [facility];  Service: Obstetrics;  Laterality: N/A;  . NO PAST SURGERIES      Family History  Problem Relation Age of Onset  . Diabetes Father   . Hypertension Father   . Breast cancer Neg Hx     Social History   Socioeconomic History  . Marital status: Married    Spouse name: Not on file  . Number of children: Not on file  . Years of education: Not on file  . Highest education level: Not on file  Occupational History  . Not on file  Social Needs  . Financial resource strain: Not on file  . Food insecurity:    Worry: Not on file    Inability: Not on file  . Transportation needs:    Medical: Not on file    Non-medical: Not on file  Tobacco Use  . Smoking status: Never Smoker  . Smokeless tobacco: Never Used  Substance and Sexual Activity  . Alcohol use: No  . Drug use: No  . Sexual activity: Yes    Birth control/protection: None  Lifestyle  . Physical activity:    Days per week: Not on file    Minutes per session: Not on file  . Stress: Not on file  Relationships  . Social connections:    Talks on phone: Not on  file    Gets together: Not on file    Attends religious service: Not on file    Active member of club or organization: Not on file    Attends meetings of clubs or organizations: Not on file    Relationship status: Not on file  Other Topics Concern  . Not on file  Social History Narrative  . Not on file     Observations/Objective: Awake, alert and oriented x 3   Review of Systems  Constitutional: Negative for fever, malaise/fatigue and weight loss.  HENT: Negative.  Negative for  nosebleeds.   Eyes: Negative.  Negative for blurred vision, double vision and photophobia.  Respiratory: Negative.  Negative for cough and shortness of breath.   Cardiovascular: Negative.  Negative for chest pain, palpitations and leg swelling.  Gastrointestinal: Positive for abdominal pain. Negative for blood in stool, constipation, diarrhea, heartburn, melena, nausea and vomiting.  Musculoskeletal: Negative.  Negative for myalgias.  Neurological: Negative.  Negative for dizziness, focal weakness, seizures and headaches.  Psychiatric/Behavioral: Negative.  Negative for suicidal ideas.    Assessment and Plan: Marcy was evaluated today for referral.  Diagnoses and all orders for this visit:  Calculus of gallbladder without cholecystitis without obstruction -     Ambulatory referral to General Surgery     Follow Up Instructions Return in about 3 months (around 07/09/2019).     I discussed the assessment and treatment plan with the patient. The patient was provided an opportunity to ask questions and all were answered. The patient agreed with the plan and demonstrated an understanding of the instructions.   The patient was advised to call back or seek an in-person evaluation if the symptoms worsen or if the condition fails to improve as anticipated.  I provided 18 minutes of non-face-to-face time during this encounter including median intraservice time, reviewing previous notes, labs, imaging, medications and explaining diagnosis and management.  Gildardo Pounds, FNP-BC

## 2019-04-14 ENCOUNTER — Other Ambulatory Visit: Payer: Self-pay

## 2019-04-14 MED FILL — ONDANSETRON ODT 4 MG TABLET: 4 | 6 days supply | Qty: 20 | Fill #0

## 2019-04-18 ENCOUNTER — Other Ambulatory Visit: Payer: Self-pay | Admitting: Surgery

## 2019-04-18 MED FILL — traMADol HCL 50 MG TABS: 50 | 6 days supply | Qty: 25 | Fill #0

## 2019-04-25 ENCOUNTER — Other Ambulatory Visit: Payer: Self-pay

## 2019-04-25 ENCOUNTER — Other Ambulatory Visit (HOSPITAL_COMMUNITY)
Admission: RE | Admit: 2019-04-25 | Discharge: 2019-04-25 | Disposition: A | Discharge status: Home / Self Care | Payer: Self-pay | Source: Ambulatory Visit | Attending: Surgery | Admitting: Surgery

## 2019-04-25 ENCOUNTER — Encounter (HOSPITAL_COMMUNITY): Payer: Self-pay

## 2019-04-25 ENCOUNTER — Encounter (HOSPITAL_COMMUNITY)
Admission: RE | Admit: 2019-04-25 | Discharge: 2019-04-25 | Disposition: A | Discharge status: Home / Self Care | Payer: Self-pay | Source: Ambulatory Visit | Attending: Surgery | Admitting: Surgery

## 2019-04-25 DIAGNOSIS — Z1159 Encounter for screening for other viral diseases: Secondary | ICD-10-CM | POA: Insufficient documentation

## 2019-04-25 DIAGNOSIS — Z01812 Encounter for preprocedural laboratory examination: Secondary | ICD-10-CM | POA: Insufficient documentation

## 2019-04-25 LAB — CBC
HCT: 44 % (ref 36.0–46.0)
Hemoglobin: 13.6 g/dL (ref 12.0–15.0)
MCH: 24.8 pg — ABNORMAL LOW (ref 26.0–34.0)
MCHC: 30.9 g/dL (ref 30.0–36.0)
MCV: 80.1 fL (ref 80.0–100.0)
Platelets: 369 10*3/uL (ref 150–400)
RBC: 5.49 MIL/uL — ABNORMAL HIGH (ref 3.87–5.11)
RDW: 14.1 % (ref 11.5–15.5)
WBC: 6.8 10*3/uL (ref 4.0–10.5)
nRBC: 0 % (ref 0.0–0.2)

## 2019-04-25 LAB — SARS CORONAVIRUS 2 BY RT PCR (HOSPITAL ORDER, PERFORMED IN ~~LOC~~ HOSPITAL LAB): SARS Coronavirus 2: NEGATIVE

## 2019-04-25 NOTE — H&P (Signed)
[name] Documented: 04/18/2019 3:22 PM Location: [facility] Patient #: [id number] DOB: [birth year] Married / Language: English / Race: Refused to Report/Unreported Female   History of Present Illness (Wanda Garcia A. Ninfa Linden MD; 04/18/2019 3:46 PM) The patient is a 43 year old female who presents for evaluation of gall stones. This is a pleasant 43 year old female referred by [name] FNP for evaluation of symptomatic gallstones. She is accompanied by her husband as she speaks only Arabic and she wants him to interpret. She was recently seen in the emergency department with an episode of rectal quadrant abdominal pain hurting due to her back with nausea and vomiting. She was found on ultrasound to have a contracted gallbladder with gallstones. Her liver function tests from mostly normal. Since that attack, she still been having intermittent right upper quadrant abdominal pain with nausea. The pain is moderate in intensity. She is also constipated. She is otherwise healthy without complaints.   Past Surgical History [name], [place]; 04/18/2019 3:22 PM) Cesarean Section - Multiple   Diagnostic Studies History [name], CMA; 04/18/2019 3:22 PM) Pap Smear  never  Allergies ([name], CMA; 04/18/2019 3:22 PM) No Known Allergies [04/18/2019]: No Known Drug Allergies [04/18/2019]: Allergies Reconciled   Medication History [name], CMA; 04/18/2019 3:23 PM) HYDROcodone-Acetaminophen (5-325MG  Tablet, Oral) Active. Zofran (4MG  Tablet, Oral) Active. Medications Reconciled  Social History [name], CMA; 04/18/2019 3:22 PM) Caffeine use  Tea. Tobacco use  Current some day smoker.  Family History ([name], [place]; 04/18/2019 3:22 PM) First Degree Relatives  No pertinent family history   Pregnancy / Birth History [name], [place]; 04/18/2019 3:22 PM) Age at menarche  54 years. Gravida  1 Maternal age  >74 Para  1 Regular periods    Other Problems [name], CMA; 04/18/2019 3:22 PM) No pertinent past medical history     Review of Systems ([name]; 04/18/2019 3:22 PM) General Not Present- Appetite Loss, Chills, Fatigue, Fever, Night Sweats, Weight Gain and Weight Loss. Skin Not Present- Change in Wart/Mole, Dryness, Hives, Jaundice, New Lesions, Non-Healing Wounds, Rash and Ulcer. HEENT Not Present- Earache, Hearing Loss, Hoarseness, Nose Bleed, Oral Ulcers, Ringing in the Ears, Seasonal Allergies, Sinus Pain, Sore Throat, Visual Disturbances, Wears glasses/contact lenses and Yellow Eyes. Respiratory Not Present- Bloody sputum, Chronic Cough, Difficulty Breathing, Snoring and Wheezing. Breast Not Present- Breast Mass, Breast Pain, Nipple Discharge and Skin Changes. Cardiovascular Not Present- Chest Pain, Difficulty Breathing Lying Down, Leg Cramps, Palpitations, Rapid Heart Rate, Shortness of Breath and Swelling of Extremities. Gastrointestinal Not Present- Abdominal Pain, Bloating, Bloody Stool, Change in Bowel Habits, Chronic diarrhea, Constipation, Difficulty Swallowing, Excessive gas, Gets full quickly at meals, Hemorrhoids, Indigestion, Nausea, Rectal Pain and Vomiting. Female Genitourinary Not Present- Frequency, Nocturia, Painful Urination, Pelvic Pain and Urgency. Musculoskeletal Not Present- Back Pain, Joint Pain, Joint Stiffness, Muscle Pain, Muscle Weakness and Swelling of Extremities. Neurological Not Present- Decreased Memory, Fainting, Headaches, Numbness, Seizures, Tingling, Tremor, Trouble walking and Weakness. Hematology Not Present- Blood Thinners, Easy Bruising, Excessive bleeding, Gland problems, HIV and Persistent Infections.  Vitals ([name] CMA; 04/18/2019 3:23 PM) 04/18/2019 3:23 PM Weight: 158.8 lb Height: 61in Body Surface Area: 1.71 m Body Mass Index: 30 kg/m  Temp.: 96.62F(Temporal)  Pulse: 99 (Regular)  BP: 116/72 (Sitting, Left Arm,  Standard)       Physical Exam ([name] MD; 04/18/2019 3:46 PM) General Mental Status-Alert. General Appearance-Consistent with stated age. Hydration-Well hydrated. Voice-Normal.  Head and Neck Head-normocephalic, atraumatic with no lesions  or palpable masses.  Eye Eyeball - Bilateral-Extraocular movements intact. Sclera/Conjunctiva - Bilateral-No scleral icterus.  Chest and Lung Exam Chest and lung exam reveals -quiet, even and easy respiratory effort with no use of accessory muscles and on auscultation, normal breath sounds, no adventitious sounds and normal vocal resonance. Inspection Chest Wall - Normal. Back - normal.  Cardiovascular Cardiovascular examination reveals -on palpation PMI is normal in location and amplitude, no palpable S3 or S4. Normal cardiac borders., normal heart sounds, regular rate and rhythm with no murmurs, carotid auscultation reveals no bruits and normal pedal pulses bilaterally.  Abdomen Inspection Inspection of the abdomen reveals - No Hernias. Skin - Scar - no surgical scars. Palpation/Percussion Palpation and Percussion of the abdomen reveal - Soft, No Rebound tenderness, No Rigidity (guarding) and No hepatosplenomegaly. Tenderness - Right Upper Quadrant. Auscultation Auscultation of the abdomen reveals - Bowel sounds normal.  Neurologic - Did not examine.  Musculoskeletal - Did not examine.    Assessment & Plan ([name] MD; 04/18/2019 3:47 PM) SYMPTOMATIC CHOLELITHIASIS (K80.20) Impression: This is a patient with symptomatic gallstones. I discussed the diagnosis with the patient and her husband. I gave him literature regarding this. I recommend a laparoscopic cholecystectomy with possible cholangiogram. I discussed the reasons for this. I discussed the surgical procedure in detail. I discussed the risks includes but is not limited to bleeding, infection, injury to surrounding structures, the need to  convert to an open procedure, cardiopulmonary issues, postoperative recovery, etc. She understands and wished to proceed with surgery which will be scheduled Current Plans Started traMADol HCl 50 MG Oral Tablet, 1 (one) Tablet every six hours, as needed, #25, 04/18/2019, No Refill. Started Zofran 4 MG Oral Tablet, 1 (one) Tablet every six hours, as needed, #20, 04/18/2019, Ref. x1.

## 2019-04-25 NOTE — Progress Notes (Signed)
[place], [address] [place] [address] Phone: [phone] Fax: [place], [address] [place] [address] Phone: [phone] Fax: [phone]      Your procedure is scheduled on June 9th.  Report to [facility] Main Entrance "A" at 9:45 A.M., and check in at the Admitting office.  Call this number if you have problems the morning of surgery:  [phone]  Call [phone] if you have any questions prior to your surgery date Monday-Friday 8am-4pm    Remember:  Do not eat after midnight.  You may drink clear liquids until 6:45 AM .   Clear liquids allowed are: Water, Non-Citrus Juices (without pulp), Carbonated Beverages, Clear Tea, Black Coffee Only, and Gatorade    Take these medicines the morning of surgery with A SIP OF WATER   Tylenol - if needed  Hydrocodone-acetaminophen - if needed    7 days prior to surgery STOP taking any Aspirin (unless otherwise instructed by your surgeon), Aleve, Naproxen, Ibuprofen, Motrin, Advil, Goody's, BC's, all herbal medications, fish oil, and all vitamins.    The Morning of Surgery  Do not wear jewelry, make-up or nail polish.  Do not wear lotions, powders, or perfumes, or deodorant  Do not shave 48 hours prior to surgery.    Do not bring valuables to the hospital.  [facility] is not responsible for any belongings or valuables.  If you are a smoker, DO NOT Smoke 24 hours prior to surgery IF you wear a CPAP at night please bring your mask, tubing, and machine the morning of surgery   Remember that you must have someone to transport you home after your surgery, and remain with you for 24 hours if you are discharged the same day.   Contacts, glasses, hearing aids, dentures or bridgework may not be worn into surgery.    Leave your suitcase  in the car.  After surgery it may be brought to your room.  For patients admitted to the hospital, discharge time will be determined by your treatment team.  Patients discharged the day of surgery will not be allowed to drive home.    Special instructions:   [place]- Preparing For Surgery  Before surgery, you can play an important role. Because skin is not sterile, your skin needs to be as free of germs as possible. You can reduce the number of germs on your skin by washing with CHG (chlorahexidine gluconate) Soap before surgery.  CHG is an antiseptic cleaner which kills germs and bonds with the skin to continue killing germs even after washing.    Oral Hygiene is also important to reduce your risk of infection.  Remember - BRUSH YOUR TEETH THE MORNING OF SURGERY WITH YOUR REGULAR TOOTHPASTE  Please do not use if you have an allergy to CHG or antibacterial soaps. If your skin becomes reddened/irritated stop using the CHG.  Do not shave (including legs and underarms) for at least 48 hours prior to first CHG shower. It is OK to shave your face.  Please follow these instructions carefully.   1. Shower the NIGHT BEFORE SURGERY and the MORNING OF SURGERY with CHG Soap.   2. If you chose to wash your hair, wash your hair first as usual with your normal shampoo.  3. After you shampoo, rinse your hair  and body thoroughly to remove the shampoo.  4. Use CHG as you would any other liquid soap. You can apply CHG directly to the skin and wash gently with a scrungie or a clean washcloth.   5. Apply the CHG Soap to your body ONLY FROM THE NECK DOWN.  Do not use on open wounds or open sores. Avoid contact with your eyes, ears, mouth and genitals (private parts). Wash Face and genitals (private parts)  with your normal soap.   6. Wash thoroughly, paying special attention to the area where your surgery will be performed.  7. Thoroughly rinse your body with warm water from the neck down.  8. DO NOT  shower/wash with your normal soap after using and rinsing off the CHG Soap.  9. Pat yourself dry with a CLEAN TOWEL.  10. Wear CLEAN PAJAMAS to bed the night before surgery, wear comfortable clothes the morning of surgery  11. Place CLEAN SHEETS on your bed the night of your first shower and DO NOT SLEEP WITH PETS.    Day of Surgery:  Do not apply any deodorants/lotions.  Please wear clean clothes to the hospital/surgery center.   Remember to brush your teeth WITH YOUR REGULAR TOOTHPASTE.   Please read over the following fact sheets that you were given.

## 2019-04-25 NOTE — Progress Notes (Signed)
PCP - [name]  EKG - 03-24-19  Anesthesia review: n/a  Patient denies shortness of breath, fever, cough and chest pain at PAT appointment   Patient verbalized understanding of instructions that were given to them at the PAT appointment. Patient was also instructed that they will need to review over the PAT instructions again at home before surgery.

## 2019-04-26 ENCOUNTER — Encounter (HOSPITAL_COMMUNITY)
Admission: RE | Disposition: A | Discharge status: Home / Self Care | Payer: Self-pay | Source: Home / Self Care | Attending: Surgery

## 2019-04-26 ENCOUNTER — Ambulatory Visit (HOSPITAL_COMMUNITY)
Admission: RE | Admit: 2019-04-26 | Discharge: 2019-04-26 | Disposition: A | Discharge status: Home / Self Care | Payer: Self-pay | Attending: Surgery | Admitting: Surgery

## 2019-04-26 ENCOUNTER — Encounter (HOSPITAL_COMMUNITY): Payer: Self-pay | Admitting: *Deleted

## 2019-04-26 ENCOUNTER — Ambulatory Visit (HOSPITAL_COMMUNITY): Payer: Self-pay | Admitting: Certified Registered Nurse Anesthetist

## 2019-04-26 ENCOUNTER — Other Ambulatory Visit: Payer: Self-pay

## 2019-04-26 DIAGNOSIS — Z79899 Other long term (current) drug therapy: Secondary | ICD-10-CM | POA: Insufficient documentation

## 2019-04-26 DIAGNOSIS — K8010 Calculus of gallbladder with chronic cholecystitis without obstruction: Secondary | ICD-10-CM | POA: Insufficient documentation

## 2019-04-26 DIAGNOSIS — K828 Other specified diseases of gallbladder: Secondary | ICD-10-CM | POA: Insufficient documentation

## 2019-04-26 DIAGNOSIS — F17200 Nicotine dependence, unspecified, uncomplicated: Secondary | ICD-10-CM | POA: Insufficient documentation

## 2019-04-26 HISTORY — PX: CHOLECYSTECTOMY: SHX55

## 2019-04-26 LAB — POCT PREGNANCY, URINE: Preg Test, Ur: NEGATIVE

## 2019-04-26 SURGERY — LAPAROSCOPIC CHOLECYSTECTOMY WITH INTRAOPERATIVE CHOLANGIOGRAM
Anesthesia: General

## 2019-04-26 MED ORDER — FENTANYL CITRATE (PF) 100 MCG/2ML IJ SOLN
25.0000 ug | INTRAMUSCULAR | Status: DC | PRN
  Administered 2019-04-26: 50 ug via INTRAVENOUS
  Administered 2019-04-26 (×2): 25 ug via INTRAVENOUS

## 2019-04-26 MED ORDER — ONDANSETRON HCL 4 MG/2ML IJ SOLN
INTRAMUSCULAR | Status: DC | PRN
  Administered 2019-04-26: 4 mg via INTRAVENOUS

## 2019-04-26 MED ORDER — DEXAMETHASONE SODIUM PHOSPHATE 10 MG/ML IJ SOLN
INTRAMUSCULAR | Status: DC | PRN
  Administered 2019-04-26: 5 mg via INTRAVENOUS

## 2019-04-26 MED ORDER — CELECOXIB 200 MG PO CAPS
200.0000 mg | ORAL_CAPSULE | ORAL | Status: AC
  Administered 2019-04-26: 09:00:00 200 mg via ORAL

## 2019-04-26 MED ORDER — BUPIVACAINE-EPINEPHRINE 0.25% -1:200000 IJ SOLN
INTRAMUSCULAR | Status: DC | PRN
  Administered 2019-04-26: 20 mL

## 2019-04-26 MED ORDER — CEFAZOLIN SODIUM-DEXTROSE 2-4 GM/100ML-% IV SOLN
2.0000 g | INTRAVENOUS | Status: AC
  Administered 2019-04-26: 2 g via INTRAVENOUS

## 2019-04-26 MED ORDER — CEFAZOLIN SODIUM-DEXTROSE 2-4 GM/100ML-% IV SOLN
INTRAVENOUS | Status: AC
  Filled 2019-04-26: qty 100

## 2019-04-26 MED ORDER — BUPIVACAINE-EPINEPHRINE (PF) 0.25% -1:200000 IJ SOLN
INTRAMUSCULAR | Status: AC
  Filled 2019-04-26: qty 30

## 2019-04-26 MED ORDER — OXYCODONE HCL 5 MG PO TABS
5.0000 mg | ORAL_TABLET | Freq: Once | ORAL | Status: AC | PRN
  Administered 2019-04-26: 5 mg via ORAL

## 2019-04-26 MED ORDER — ACETAMINOPHEN 160 MG/5ML PO SOLN: 1000.0000 mg | Freq: Once | ORAL | Status: DC | PRN

## 2019-04-26 MED ORDER — SUCCINYLCHOLINE CHLORIDE 200 MG/10ML IV SOSY
PREFILLED_SYRINGE | INTRAVENOUS | Status: DC | PRN
  Administered 2019-04-26: 80 mg via INTRAVENOUS

## 2019-04-26 MED ORDER — OXYCODONE HCL 5 MG PO TABS: 5.0000 mg | ORAL_TABLET | Freq: Four times a day (QID) | ORAL | 0 refills | Status: DC | PRN

## 2019-04-26 MED ORDER — LIDOCAINE 2% (20 MG/ML) 5 ML SYRINGE
INTRAMUSCULAR | Status: DC | PRN
  Administered 2019-04-26: 60 mg via INTRAVENOUS

## 2019-04-26 MED ORDER — KETOROLAC TROMETHAMINE 30 MG/ML IJ SOLN
30.0000 mg | Freq: Once | INTRAMUSCULAR | Status: AC
  Administered 2019-04-26: 30 mg via INTRAVENOUS

## 2019-04-26 MED ORDER — PROPOFOL 10 MG/ML IV BOLUS
INTRAVENOUS | Status: DC | PRN
  Administered 2019-04-26: 160 mg via INTRAVENOUS

## 2019-04-26 MED ORDER — ACETAMINOPHEN 500 MG PO TABS
ORAL_TABLET | ORAL | Status: AC
  Administered 2019-04-26: 1000 mg via ORAL
  Filled 2019-04-26: qty 2

## 2019-04-26 MED ORDER — FENTANYL CITRATE (PF) 250 MCG/5ML IJ SOLN
INTRAMUSCULAR | Status: DC | PRN
  Administered 2019-04-26: 50 ug via INTRAVENOUS
  Administered 2019-04-26: 150 ug via INTRAVENOUS
  Administered 2019-04-26: 50 ug via INTRAVENOUS

## 2019-04-26 MED ORDER — CHLORHEXIDINE GLUCONATE CLOTH 2 % EX PADS: 6.0000 | MEDICATED_PAD | Freq: Once | CUTANEOUS | Status: DC

## 2019-04-26 MED ORDER — FENTANYL CITRATE (PF) 100 MCG/2ML IJ SOLN
INTRAMUSCULAR | Status: AC
  Filled 2019-04-26: qty 2

## 2019-04-26 MED ORDER — GABAPENTIN 300 MG PO CAPS
300.0000 mg | ORAL_CAPSULE | ORAL | Status: AC
  Administered 2019-04-26: 09:00:00 300 mg via ORAL

## 2019-04-26 MED ORDER — ACETAMINOPHEN 10 MG/ML IV SOLN: 1000.0000 mg | Freq: Once | INTRAVENOUS | Status: DC | PRN

## 2019-04-26 MED ORDER — CELECOXIB 200 MG PO CAPS
ORAL_CAPSULE | ORAL | Status: AC
  Administered 2019-04-26: 09:00:00 200 mg via ORAL
  Filled 2019-04-26: qty 1

## 2019-04-26 MED ORDER — MIDAZOLAM HCL 2 MG/2ML IJ SOLN
INTRAMUSCULAR | Status: AC
  Filled 2019-04-26: qty 2

## 2019-04-26 MED ORDER — LACTATED RINGERS IV SOLN
INTRAVENOUS | Status: DC
  Administered 2019-04-26: 09:00:00 via INTRAVENOUS

## 2019-04-26 MED ORDER — ACETAMINOPHEN 500 MG PO TABS
1000.0000 mg | ORAL_TABLET | ORAL | Status: AC
  Administered 2019-04-26: 09:00:00 1000 mg via ORAL

## 2019-04-26 MED ORDER — SODIUM CHLORIDE 0.9 % IR SOLN
Status: DC | PRN
  Administered 2019-04-26: 1000 mL

## 2019-04-26 MED ORDER — ROCURONIUM BROMIDE 10 MG/ML (PF) SYRINGE
PREFILLED_SYRINGE | INTRAVENOUS | Status: DC | PRN
  Administered 2019-04-26: 30 mg via INTRAVENOUS

## 2019-04-26 MED ORDER — OXYCODONE HCL 5 MG/5ML PO SOLN: 5.0000 mg | Freq: Once | ORAL | Status: AC | PRN

## 2019-04-26 MED ORDER — MIDAZOLAM HCL 2 MG/2ML IJ SOLN
INTRAMUSCULAR | Status: DC | PRN
  Administered 2019-04-26: 2 mg via INTRAVENOUS

## 2019-04-26 MED ORDER — 0.9 % SODIUM CHLORIDE (POUR BTL) OPTIME
TOPICAL | Status: DC | PRN
  Administered 2019-04-26: 10:00:00 1000 mL

## 2019-04-26 MED ORDER — GABAPENTIN 300 MG PO CAPS
ORAL_CAPSULE | ORAL | Status: AC
  Administered 2019-04-26: 300 mg via ORAL
  Filled 2019-04-26: qty 1

## 2019-04-26 MED ORDER — ACETAMINOPHEN 500 MG PO TABS: 1000.0000 mg | ORAL_TABLET | Freq: Once | ORAL | Status: DC | PRN

## 2019-04-26 MED ORDER — PROPOFOL 10 MG/ML IV BOLUS
INTRAVENOUS | Status: AC
  Filled 2019-04-26: qty 20

## 2019-04-26 MED ORDER — FENTANYL CITRATE (PF) 250 MCG/5ML IJ SOLN
INTRAMUSCULAR | Status: AC
  Filled 2019-04-26: qty 5

## 2019-04-26 MED ORDER — SUGAMMADEX SODIUM 200 MG/2ML IV SOLN
INTRAVENOUS | Status: DC | PRN
  Administered 2019-04-26: 200 mg via INTRAVENOUS

## 2019-04-26 MED ORDER — KETOROLAC TROMETHAMINE 30 MG/ML IJ SOLN
INTRAMUSCULAR | Status: AC
  Filled 2019-04-26: qty 1

## 2019-04-26 MED ORDER — OXYCODONE HCL 5 MG PO TABS
ORAL_TABLET | ORAL | Status: AC
  Filled 2019-04-26: qty 1

## 2019-04-26 SURGICAL SUPPLY — 38 items
APPLIER CLIP 5 13 M/L LIGAMAX5 (MISCELLANEOUS) ×3
BLADE CLIPPER SURG (BLADE) IMPLANT
CANISTER SUCT 3000ML PPV (MISCELLANEOUS) ×3 IMPLANT
CHLORAPREP W/TINT 26ML (MISCELLANEOUS) ×3 IMPLANT
CLIP APPLIE 5 13 M/L LIGAMAX5 (MISCELLANEOUS) ×1 IMPLANT
COVER MAYO STAND STRL (DRAPES) IMPLANT
COVER SURGICAL LIGHT HANDLE (MISCELLANEOUS) ×3 IMPLANT
COVER WAND RF STERILE (DRAPES) ×3 IMPLANT
DERMABOND ADVANCED (GAUZE/BANDAGES/DRESSINGS) ×2
DERMABOND ADVANCED .7 DNX12 (GAUZE/BANDAGES/DRESSINGS) ×1 IMPLANT
DRAPE C-ARM 42X72 X-RAY (DRAPES) IMPLANT
ELECT REM PT RETURN 9FT ADLT (ELECTROSURGICAL) ×3
ELECTRODE REM PT RTRN 9FT ADLT (ELECTROSURGICAL) ×1 IMPLANT
ENDOLOOP SUT PDS II  0 18 (SUTURE) ×2
ENDOLOOP SUT PDS II 0 18 (SUTURE) ×1 IMPLANT
GLOVE SURG SIGNA 7.5 PF LTX (GLOVE) ×3 IMPLANT
GOWN STRL REUS W/ TWL LRG LVL3 (GOWN DISPOSABLE) ×2 IMPLANT
GOWN STRL REUS W/ TWL XL LVL3 (GOWN DISPOSABLE) ×1 IMPLANT
GOWN STRL REUS W/TWL LRG LVL3 (GOWN DISPOSABLE) ×4
GOWN STRL REUS W/TWL XL LVL3 (GOWN DISPOSABLE) ×2
KIT BASIN OR (CUSTOM PROCEDURE TRAY) ×3 IMPLANT
KIT TURNOVER KIT B (KITS) ×3 IMPLANT
NS IRRIG 1000ML POUR BTL (IV SOLUTION) ×3 IMPLANT
PAD ARMBOARD 7.5X6 YLW CONV (MISCELLANEOUS) ×3 IMPLANT
POUCH SPECIMEN RETRIEVAL 10MM (ENDOMECHANICALS) ×3 IMPLANT
SCISSORS LAP 5X35 DISP (ENDOMECHANICALS) ×3 IMPLANT
SET CHOLANGIOGRAPH 5 50 .035 (SET/KITS/TRAYS/PACK) IMPLANT
SET IRRIG TUBING LAPAROSCOPIC (IRRIGATION / IRRIGATOR) ×3 IMPLANT
SET TUBE SMOKE EVAC HIGH FLOW (TUBING) ×3 IMPLANT
SLEEVE ENDOPATH XCEL 5M (ENDOMECHANICALS) ×6 IMPLANT
SPECIMEN JAR SMALL (MISCELLANEOUS) ×3 IMPLANT
SUT MNCRL AB 4-0 PS2 18 (SUTURE) ×3 IMPLANT
TOWEL OR 17X24 6PK STRL BLUE (TOWEL DISPOSABLE) ×3 IMPLANT
TOWEL OR 17X26 10 PK STRL BLUE (TOWEL DISPOSABLE) ×3 IMPLANT
TRAY LAPAROSCOPIC MC (CUSTOM PROCEDURE TRAY) ×3 IMPLANT
TROCAR XCEL BLUNT TIP 100MML (ENDOMECHANICALS) ×3 IMPLANT
TROCAR XCEL NON-BLD 5MMX100MML (ENDOMECHANICALS) ×3 IMPLANT
WATER STERILE IRR 1000ML POUR (IV SOLUTION) ×3 IMPLANT

## 2019-04-26 NOTE — Anesthesia Preprocedure Evaluation (Signed)
Anesthesia Evaluation  Patient identified by MRN, date of birth, ID band Patient awake    Reviewed: Allergy & Precautions, H&P , NPO status , Patient's Chart, lab work & pertinent test results  Airway Mallampati: II  TM Distance: >3 FB Neck ROM: Full    Dental no notable dental hx. (+) Teeth Intact, Dental Advisory Given   Pulmonary Current Smoker,    Pulmonary exam normal breath sounds clear to auscultation       Cardiovascular negative cardio ROS   Rhythm:Regular Rate:Normal     Neuro/Psych negative neurological ROS  negative psych ROS   GI/Hepatic negative GI ROS, Neg liver ROS,   Endo/Other  negative endocrine ROS  Renal/GU negative Renal ROS  negative genitourinary   Musculoskeletal   Abdominal   Peds  Hematology negative hematology ROS (+)   Anesthesia Other Findings   Reproductive/Obstetrics negative OB ROS                             Anesthesia Physical Anesthesia Plan  ASA: II  Anesthesia Plan: General   Post-op Pain Management:    Induction: Intravenous  PONV Risk Score and Plan: 3 and Ondansetron, Dexamethasone and Midazolam  Airway Management Planned: Oral ETT  Additional Equipment:   Intra-op Plan:   Post-operative Plan: Extubation in OR  Informed Consent: I have reviewed the patients History and Physical, chart, labs and discussed the procedure including the risks, benefits and alternatives for the proposed anesthesia with the patient or authorized representative who has indicated his/her understanding and acceptance.     Dental advisory given  Plan Discussed with: CRNA  Anesthesia Plan Comments:         Anesthesia Quick Evaluation

## 2019-04-26 NOTE — Interval H&P Note (Signed)
History and Physical Interval Note:no change in H and P  04/26/2019 9:11 AM  Wanda Garcia  has presented today for surgery, with the diagnosis of SYMPTOMATIC GALLSTONES.  The various methods of treatment have been discussed with the patient and family. After consideration of risks, benefits and other options for treatment, the patient has consented to  Procedure(s): LAPAROSCOPIC CHOLECYSTECTOMY WITH POSSIBLE INTRAOPERATIVE CHOLANGIOGRAM (N/A) as a surgical intervention.  The patient's history has been reviewed, patient examined, no change in status, stable for surgery.  I have reviewed the patient's chart and labs.  Questions were answered to the patient's satisfaction.     Coralie Keens

## 2019-04-26 NOTE — Op Note (Signed)
Laparoscopic Cholecystectomy Procedure Note  Indications: This patient presents with symptomatic gallbladder disease and will undergo laparoscopic cholecystectomy.  Pre-operative Diagnosis: Chronic cholecystitis with cholelithiasis  Post-operative Diagnosis: Same  Surgeon: Coralie Keens   Assistants: 0  Anesthesia: General endotracheal anesthesia  ASA Class: 1  Procedure Details  The patient was seen again in the Holding Room. The risks, benefits, complications, treatment options, and expected outcomes were discussed with the patient. The possibilities of reaction to medication, pulmonary aspiration, perforation of viscus, bleeding, recurrent infection, finding a normal gallbladder, the need for additional procedures, failure to diagnose a condition, the possible need to convert to an open procedure, and creating a complication requiring transfusion or operation were discussed with the patient. The likelihood of improving the patient's symptoms with return to their baseline status is good.  The patient and/or family concurred with the proposed plan, giving informed consent. The site of surgery properly noted. The patient was taken to Operating Room, identified as Wanda Garcia and the procedure verified as Laparoscopic Cholecystectomy with Intraoperative Cholangiogram. A Time Out was held and the above information confirmed.  Prior to the induction of general anesthesia, antibiotic prophylaxis was administered. General endotracheal anesthesia was then administered and tolerated well. After the induction, the abdomen was prepped with Chloraprep and draped in sterile fashion. The patient was positioned in the supine position.  Local anesthetic agent was injected into the skin near the umbilicus and an incision made. We dissected down to the abdominal fascia with blunt dissection.  The fascia was incised vertically and we entered the peritoneal cavity bluntly.  A pursestring suture of  0-Vicryl was placed around the fascial opening.  The Hasson cannula was inserted and secured with the stay suture.  Pneumoperitoneum was then created with CO2 and tolerated well without any adverse changes in the patient's vital signs. A 5-mm port was placed in the subxiphoid position.  Two 5-mm ports were placed in the right upper quadrant. All skin incisions were infiltrated with a local anesthetic agent before making the incision and placing the trocars.   We positioned the patient in reverse Trendelenburg, tilted slightly to the patient's left.  The gallbladder was identified, the fundus grasped and retracted cephalad. Adhesions were lysed bluntly and with the electrocautery where indicated, taking care not to injure any adjacent organs or viscus. The infundibulum was grasped and retracted laterally, exposing the peritoneum overlying the triangle of Calot. This was then divided and exposed in a blunt fashion. The cystic duct was clearly identified and bluntly dissected circumferentially. A critical view of the cystic duct and cystic artery was obtained.  The cystic duct was then ligated with clips and divided.  It was a thickened cystic stump so I elected to also place and endoloop on it. The cystic artery was, dissected free, ligated with clips and divided as well.   The gallbladder was dissected from the liver bed in retrograde fashion with the electrocautery. The gallbladder was removed and placed in an Endocatch sac. The liver bed was irrigated and inspected. Hemostasis was achieved with the electrocautery. Copious irrigation was utilized and was repeatedly aspirated until clear.  The gallbladder and Endocatch sac were then removed through the umbilical port site.  The pursestring suture was used to close the umbilical fascia.    We again inspected the right upper quadrant for hemostasis.  Pneumoperitoneum was released as we removed the trocars.  4-0 Monocryl was used to close the skin.   Skin glue was  then applied. The patient  was then extubated and brought to the recovery room in stable condition. Instrument, sponge, and needle counts were correct at closure and at the conclusion of the case.   Findings: Cholecystitis with Cholelithiasis  Estimated Blood Loss: Minimal         Drains: 0         Specimens: Gallbladder           Complications: None; patient tolerated the procedure well.         Disposition: PACU - hemodynamically stable.         Condition: stable

## 2019-04-26 NOTE — Anesthesia Procedure Notes (Signed)
Procedure Name: Intubation Date/Time: 04/26/2019 9:44 AM Performed by: Elayne Snare, CRNA Pre-anesthesia Checklist: Patient identified, Emergency Drugs available, Suction available and Patient being monitored Patient Re-evaluated:Patient Re-evaluated prior to induction Oxygen Delivery Method: Circle System Utilized Preoxygenation: Pre-oxygenation with 100% oxygen Induction Type: IV induction and Rapid sequence Laryngoscope Size: Mac and 3 Grade View: Grade I Tube type: Oral Tube size: 7.0 mm Number of attempts: 1 Airway Equipment and Method: Stylet and Oral airway Placement Confirmation: ETT inserted through vocal cords under direct vision,  positive ETCO2 and breath sounds checked- equal and bilateral Secured at: 21 cm Tube secured with: Tape Dental Injury: Teeth and Oropharynx as per pre-operative assessment

## 2019-04-26 NOTE — Transfer of Care (Signed)
Immediate Anesthesia Transfer of Care Note  Patient: Wanda Garcia  Procedure(s) Performed: LAPAROSCOPIC CHOLECYSTECTOMY WITH POSSIBLE INTRAOPERATIVE CHOLANGIOGRAM (N/A )  Patient Location: PACU  Anesthesia Type:General  Level of Consciousness: awake, alert  and patient cooperative  Airway & Oxygen Therapy: Patient Spontanous Breathing  Post-op Assessment: Report given to RN and Post -op Vital signs reviewed and stable  Post vital signs: Reviewed and stable  Last Vitals:  Vitals Value Taken Time  BP 146/100 04/26/2019 10:33 AM  Temp    Pulse 94 04/26/2019 10:35 AM  Resp 17 04/26/2019 10:35 AM  SpO2 100 % 04/26/2019 10:35 AM  Vitals shown include unvalidated device data.  Last Pain: There were no vitals filed for this visit.    Patients Stated Pain Goal: 2 (93/81/82 9937)  Complications: No apparent anesthesia complications

## 2019-04-26 NOTE — Progress Notes (Signed)
Pt anxious, c/o " I can't breathe". O2 sat 98-100% on RA, she is very anxious. Dr Ermalene Postin here & aware. Pain meds given, freq. reassurance. Will cont. to monitor.

## 2019-04-26 NOTE — Progress Notes (Signed)
Pt much less anxious, c/o right shoulder pain, naps when left alone, O2 sats 100 % on RA, no respiratory distress. Dr Ermalene Postin fully updated-OK to go to Phase 2  & DC to home.

## 2019-04-26 NOTE — Discharge Instructions (Signed)
[facility] ______[facility] °LAPAROSCOPIC SURGERY: POST OP INSTRUCTIONS °Always review your discharge instruction sheet given to you by the facility where your surgery was performed. °IF YOU HAVE DISABILITY OR FAMILY LEAVE FORMS, YOU MUST BRING THEM TO THE OFFICE FOR PROCESSING.   °DO NOT GIVE THEM TO YOUR DOCTOR. ° °1. A prescription for pain medication may be given to you upon discharge.  Take your pain medication as prescribed, if needed.  If narcotic pain medicine is not needed, then you may take acetaminophen (Tylenol) or ibuprofen (Advil) as needed. °2. Take your usually prescribed medications unless otherwise directed. °3. If you need a refill on your pain medication, please contact your pharmacy.  They will contact our office to request authorization. Prescriptions will not be filled after 5pm or on week-ends. °4. You should follow a light diet the first few days after arrival home, such as soup and crackers, etc.  Be sure to include lots of fluids daily. °5. Most patients will experience some swelling and bruising in the area of the incisions.  Ice packs will help.  Swelling and bruising can take several days to resolve.  °6. It is common to experience some constipation if taking pain medication after surgery.  Increasing fluid intake and taking a stool softener (such as Colace) will usually help or prevent this problem from occurring.  A mild laxative (Milk of Magnesia or Miralax) should be taken according to package instructions if there are no bowel movements after 48 hours. °7. Unless discharge instructions indicate otherwise, you may remove your bandages 24-48 hours after surgery, and you may shower at that time.  You may have steri-strips (small skin tapes) in place directly over the incision.  These strips should be left on the skin for 7-10 days.  If your surgeon used skin glue on the incision, you may shower in 24 hours.  The glue will flake off over the next 2-3 weeks.  Any sutures or  staples will be removed at the office during your follow-up visit. °8. ACTIVITIES:  You may resume regular (light) daily activities beginning the next day--such as daily self-care, walking, climbing stairs--gradually increasing activities as tolerated.  You may have sexual intercourse when it is comfortable.  Refrain from any heavy lifting or straining until approved by your doctor. °a. You may drive when you are no longer taking prescription pain medication, you can comfortably wear a seatbelt, and you can safely maneuver your car and apply brakes. °b. RETURN TO WORK:  __________________________________________________________ °9. You should see your doctor in the office for a follow-up appointment approximately 2-3 weeks after your surgery.  Make sure that you call for this appointment within a day or two after you arrive home to insure a convenient appointment time. °10. OTHER INSTRUCTIONS:OK TO SHOWER STARTING TOMORROW °11. ICE PACK, TYLENOL, IBUPROFEN ALSO FOR PAIN °12. NO LIFTING MORE THAN 15 TO 20 POUNDS FOR 2 WEEKS __________________________________________________________________________________________________________________________ __________________________________________________________________________________________________________________________ °WHEN TO CALL YOUR DOCTOR: °1. Fever over 101.0 °2. Inability to urinate °3. Continued bleeding from incision. °4. Increased pain, redness, or drainage from the incision. °5. Increasing abdominal pain ° °The clinic staff is available to answer your questions during regular business hours.  Please don’t hesitate to call and ask to speak to one of the nurses for clinical concerns.  If you have a medical emergency, go to the nearest emergency room or call 911.  A surgeon from [facility] is always on call at the hospital. °[address], [address], [place], [place]  [address] ? [address] [address], [place], [place]   [address] °[phone] ?  [phone] ? FAX [phone] °Web site: [email or web address] °

## 2019-04-27 ENCOUNTER — Encounter (HOSPITAL_COMMUNITY): Payer: Self-pay | Admitting: Surgery

## 2019-05-03 NOTE — Anesthesia Postprocedure Evaluation (Signed)
Anesthesia Post Note  Patient: Chantal Santillana  Procedure(s) Performed: LAPAROSCOPIC CHOLECYSTECTOMY WITH POSSIBLE INTRAOPERATIVE CHOLANGIOGRAM (N/A )     Patient location during evaluation: PACU Anesthesia Type: General Level of consciousness: awake and alert Pain management: pain level controlled Vital Signs Assessment: post-procedure vital signs reviewed and stable Respiratory status: spontaneous breathing, nonlabored ventilation, respiratory function stable and patient connected to nasal cannula oxygen Cardiovascular status: stable and blood pressure returned to baseline Postop Assessment: no apparent nausea or vomiting Anesthetic complications: no    Last Vitals:  Vitals:   04/26/19 1135 04/26/19 1144  BP: 112/78 (!) 145/69  Pulse: (!) 55 (!) 56  Resp: 18 16  Temp:    SpO2: 100% 100%    Last Pain:  Vitals:   04/26/19 1130  PainSc: Asleep                 Burdette Gergely

## 2019-05-06 ENCOUNTER — Telehealth: Payer: Self-pay | Admitting: Nurse Practitioner

## 2019-05-06 NOTE — Telephone Encounter (Signed)
UN able to LVM since Pt has not set up a VM, to informed her that at this time she can not applied for the CAFA since she has an alert on her Chart for Potential Medicaid, I will return her paper with a letter explain her to contact the [place] Medicaid dept.

## 2019-08-25 IMAGING — MG DIGITAL DIAGNOSTIC BILATERAL MAMMOGRAM WITH TOMO AND CAD
6 of 10 series · 6 of 30 positions shown · non-contrast
Comparison: Baseline exam

CLINICAL DATA: Patient reports pain and lump near the RIGHT nipple,
only when breastfeeding.

EXAM:
DIGITAL DIAGNOSTIC BILATERAL MAMMOGRAM WITH CAD AND TOMO
ULTRASOUND RIGHT BREAST

[R CC synth-2D]
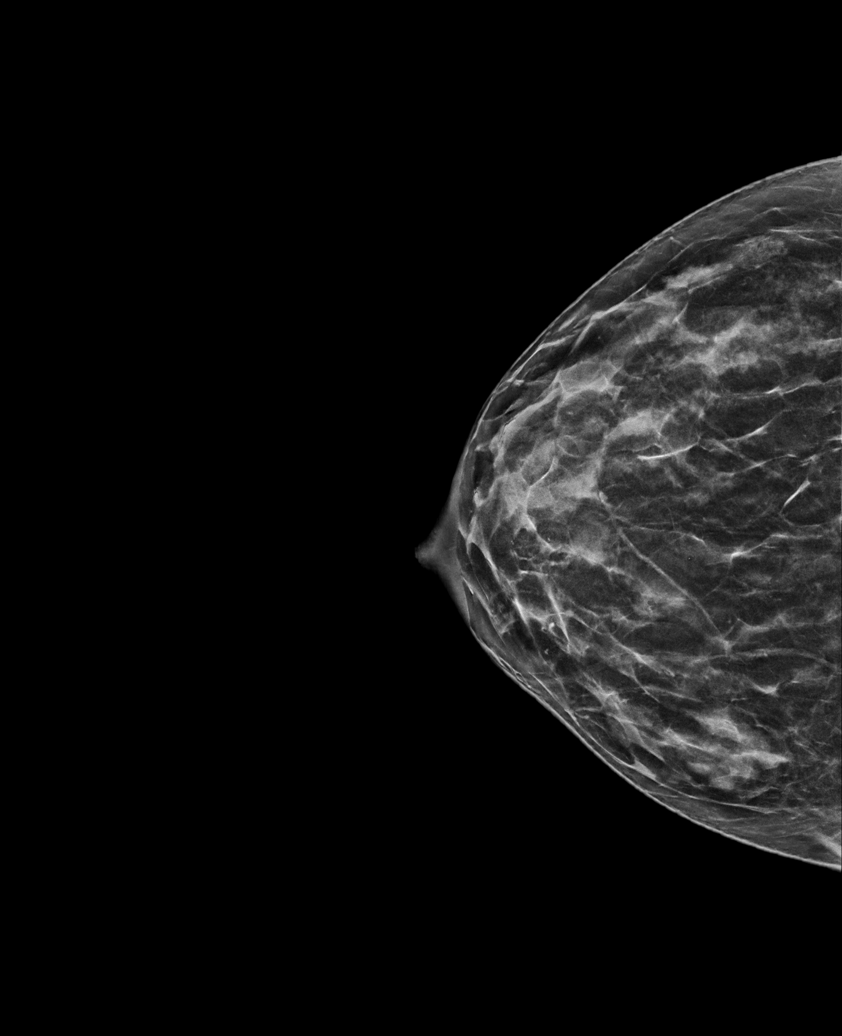

[R MLO synth-2D (1 of 2)]
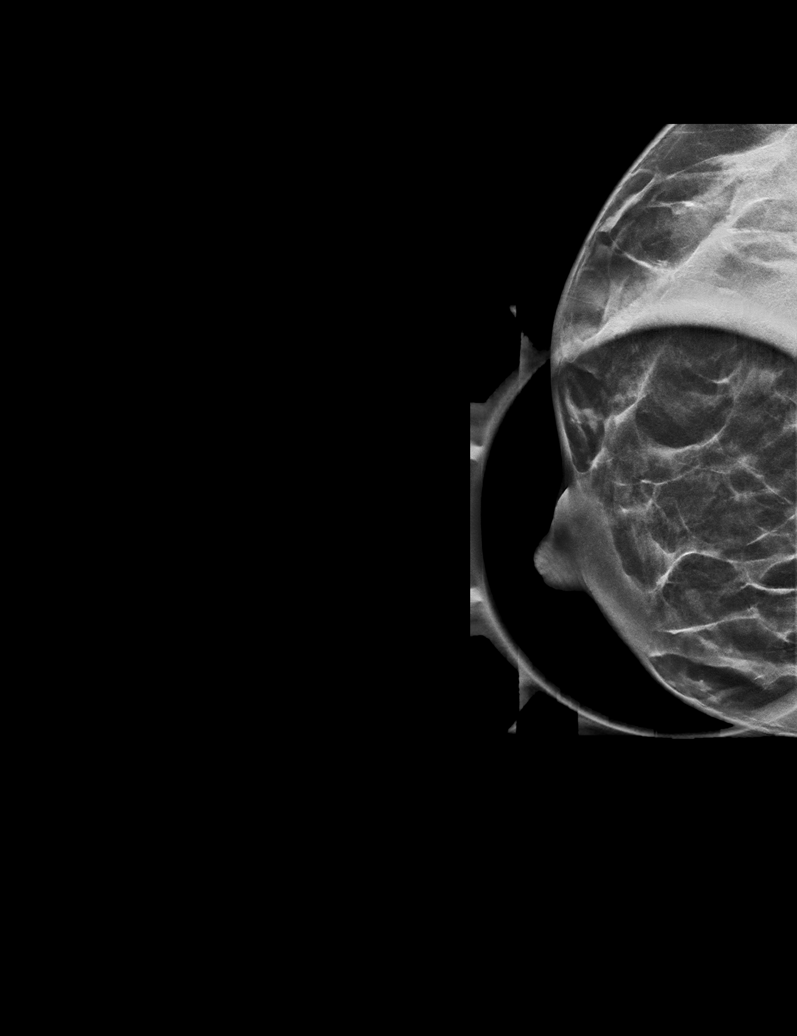

[L MLO synth-2D]
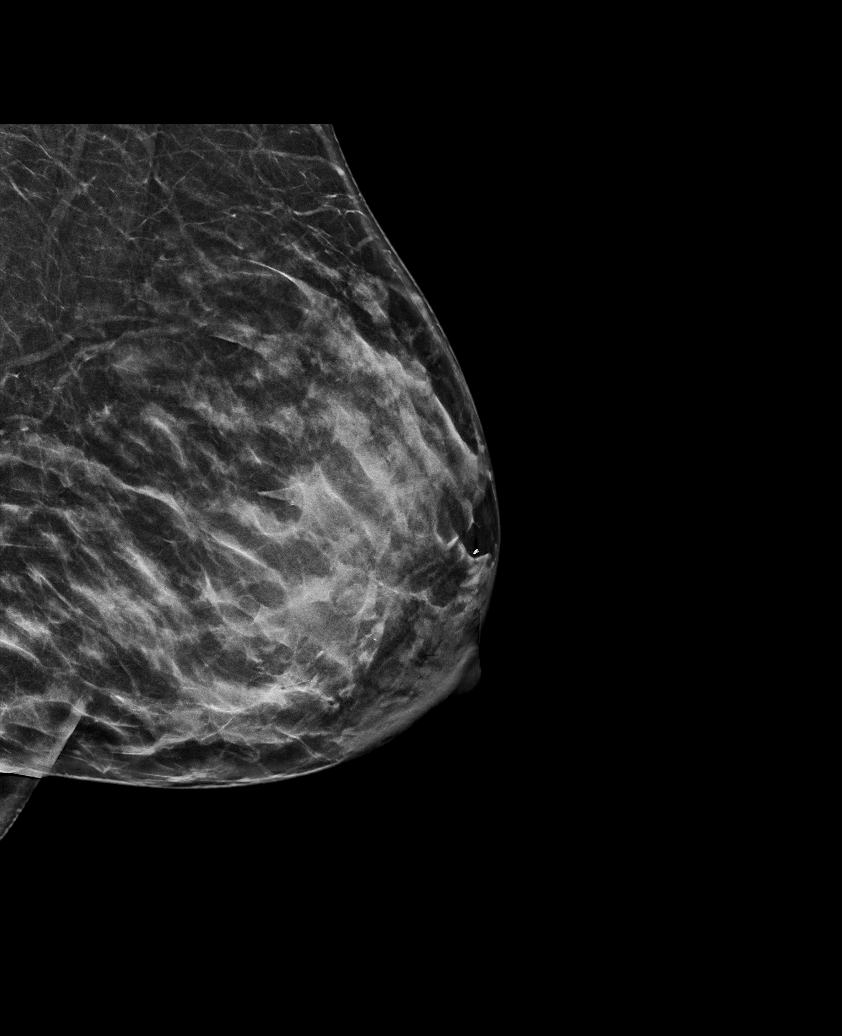

[R MLO synth-2D (2 of 2)]
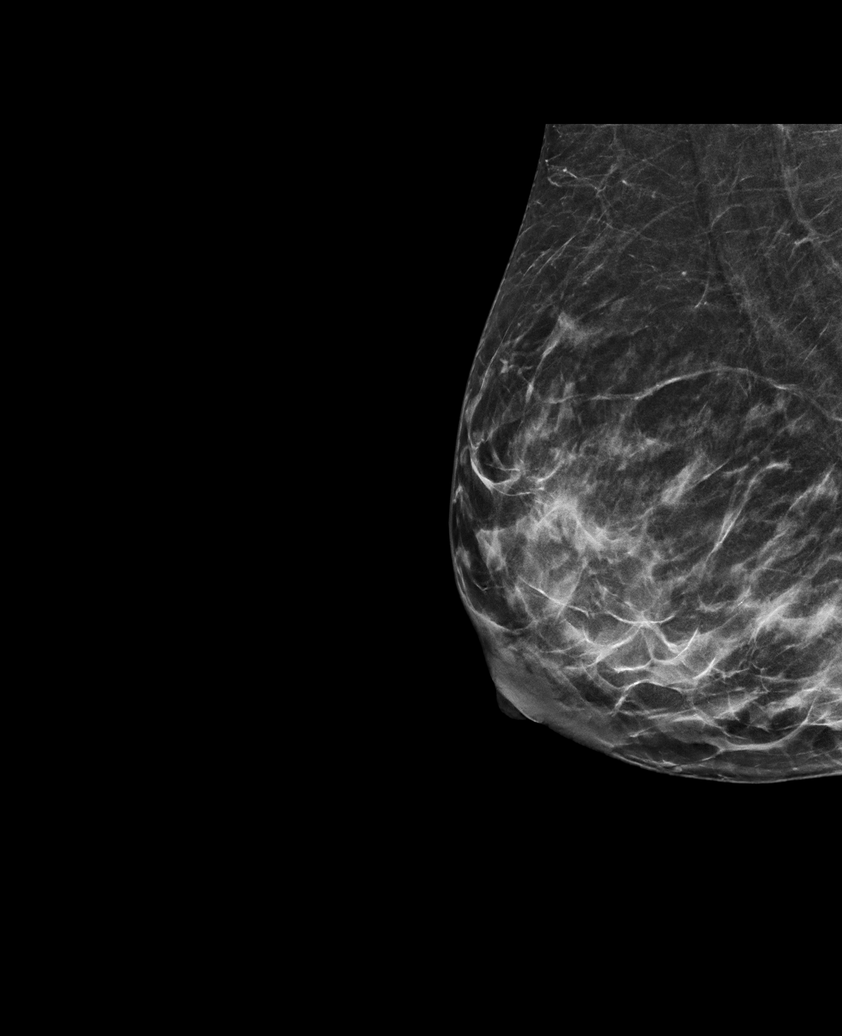

[L CC synth-2D]
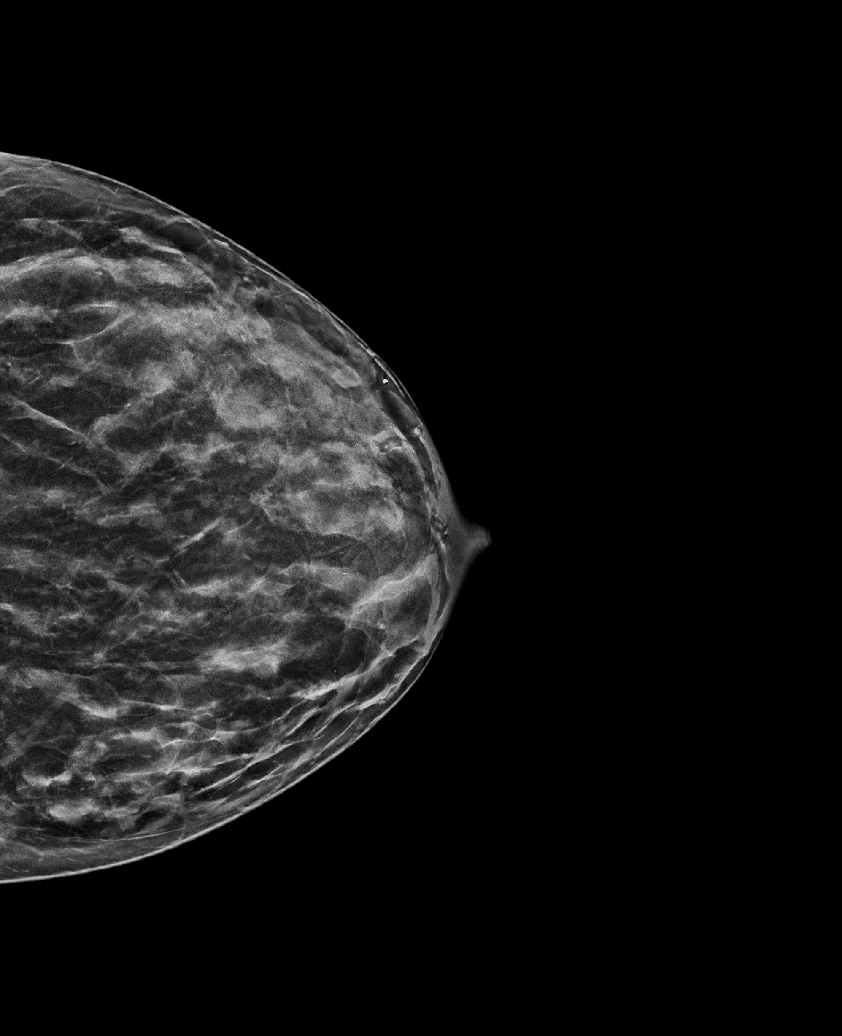

[L MLO tomo · tomo slice 31/61.0]
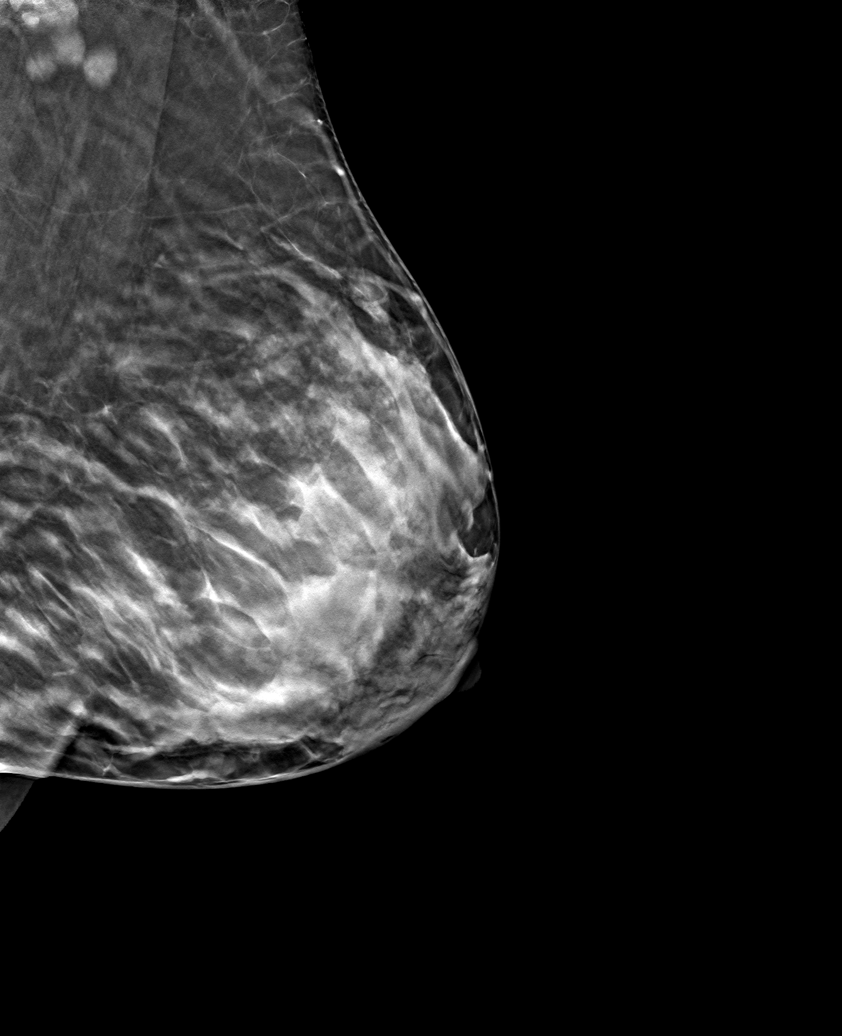

[6 of 30 positions shown; findings below may reference images not displayed]

ACR Breast Density Category c: The breast tissue is heterogeneously
dense, which may obscure small masses.
FINDINGS: No suspicious mass, distortion, or microcalcifications are
identified to suggest presence of malignancy. Spot tangential view
in the retroareolar region of the RIGHT breast is unremarkable.

Mammographic images were processed with CAD.

On physical exam, I palpate no abnormality in the 3 o'clock
periareolar region of the RIGHT breast. The patient does not feel
the abnormality today but notices it when she is breastfeeding.

Targeted ultrasound is performed, showing normal appearing
fibroglandular tissue in the circumareolar region of the RIGHT
breast. No suspicious mass, distortion, or acoustic shadowing is
demonstrated with ultrasound. Mildly prominent retroareolar ducts
are present. No intraductal mass.
IMPRESSION: No mammographic or ultrasound evidence for malignancy.

RECOMMENDATION:
Screening mammogram in one year.(Code:[id number]-I-5R2)

I have discussed the findings and recommendations with the patient
with the assistance of an interpreter. Results were also provided in
writing at the conclusion of the visit. If applicable, a reminder
letter will be sent to the patient regarding the next appointment.

BI-RADS CATEGORY  1: Negative.

## 2020-02-20 ENCOUNTER — Ambulatory Visit: Payer: Self-pay | Attending: Internal Medicine

## 2020-05-02 IMAGING — US ULTRASOUND ABDOMEN LIMITED
1 series · 14 of 25 positions shown · non-contrast
Comparison: None.

CLINICAL DATA: Right upper quadrant pain

EXAM:
ULTRASOUND ABDOMEN LIMITED RIGHT UPPER QUADRANT

[Series 1: ultrasound abdomen limited · 14 of 60 slices shown]
[im 1/60]
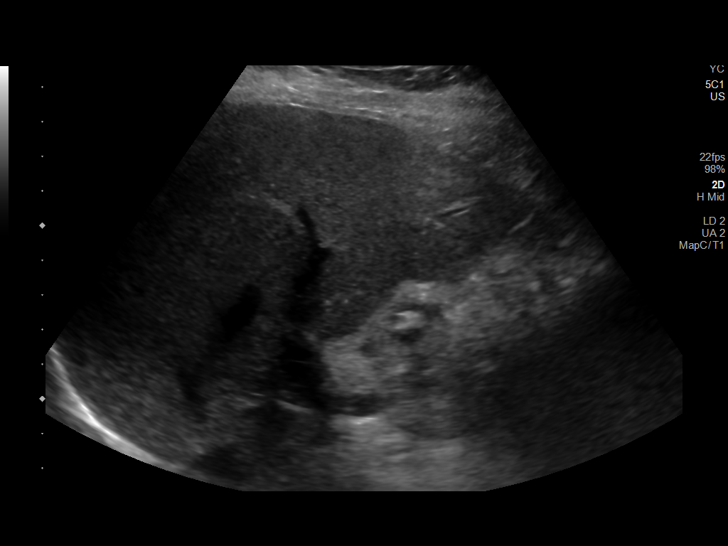
[im 5/60]
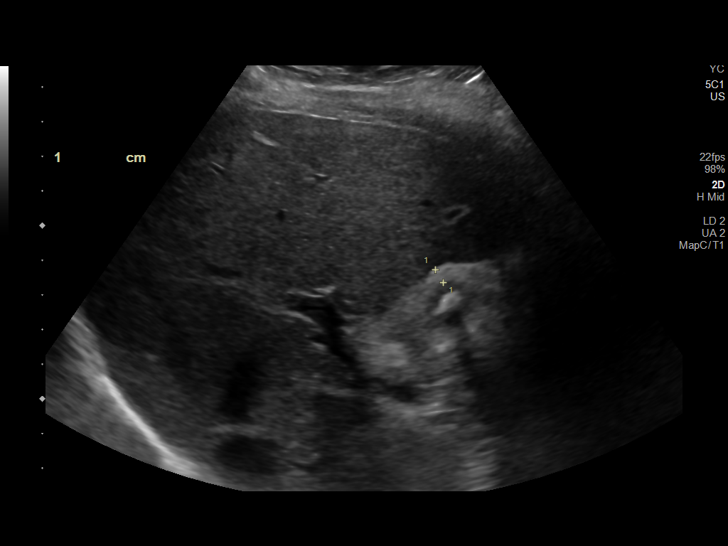
[im 10/60]
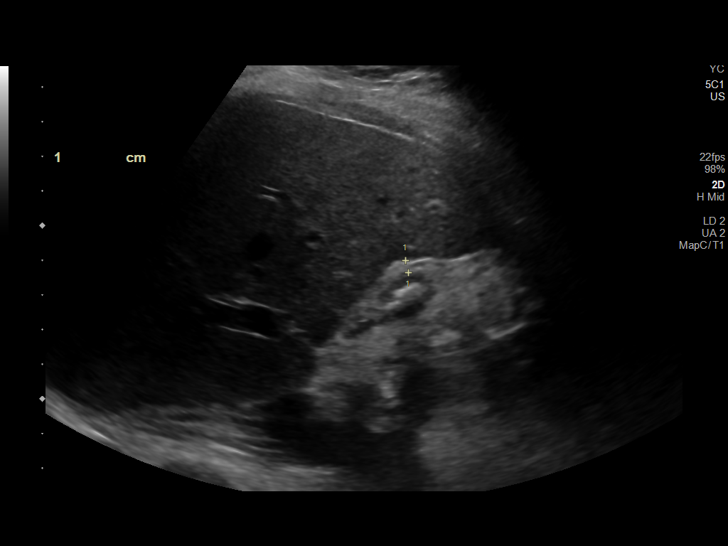
[im 15/60]
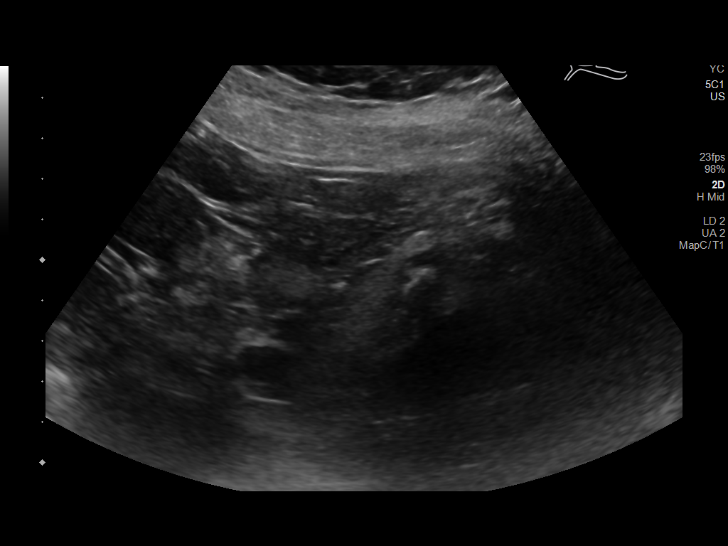
[im 20/60]
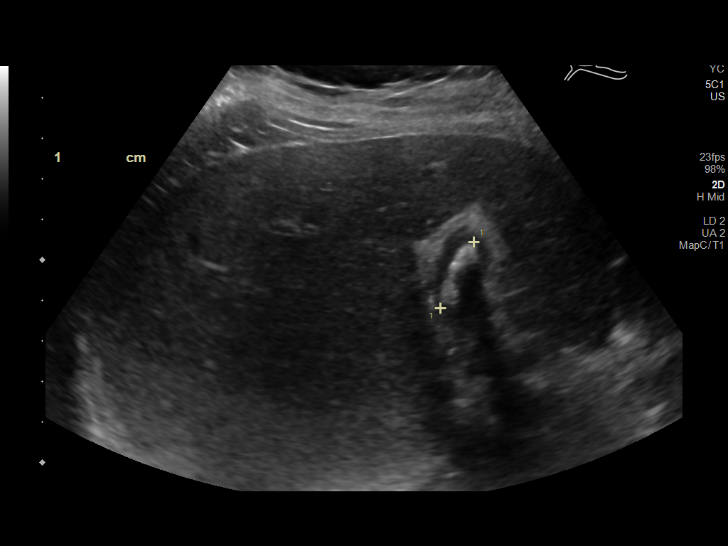
[im 23/60]
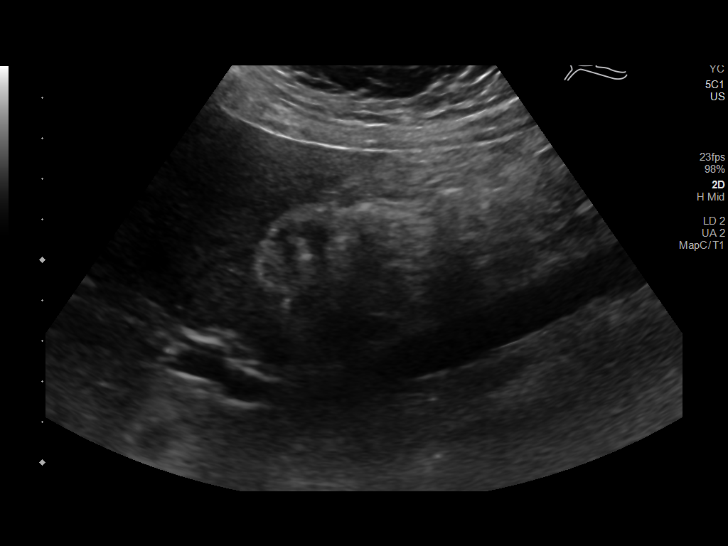
[im 28/60]
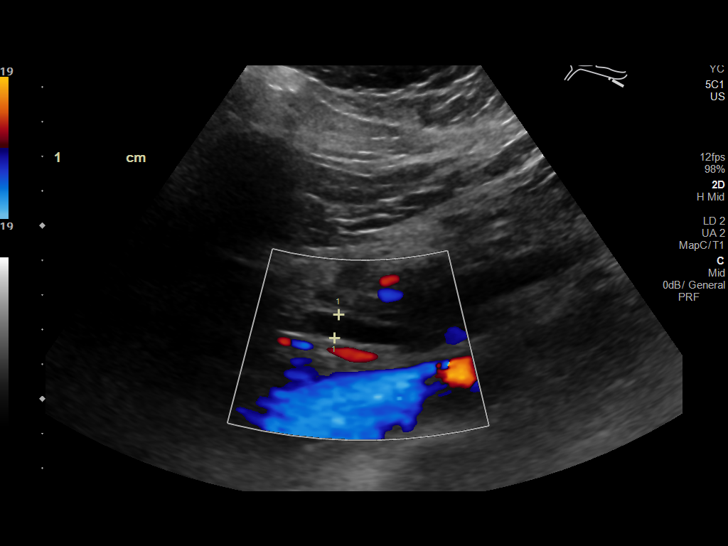
[im 32/60]
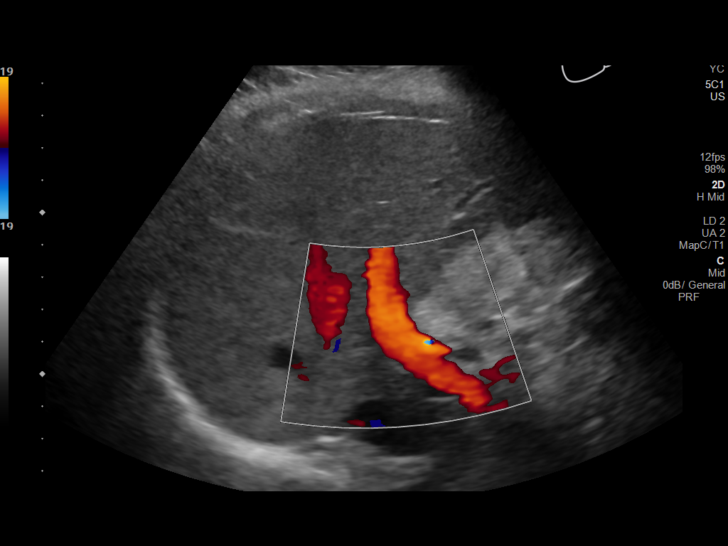
[im 37/60]
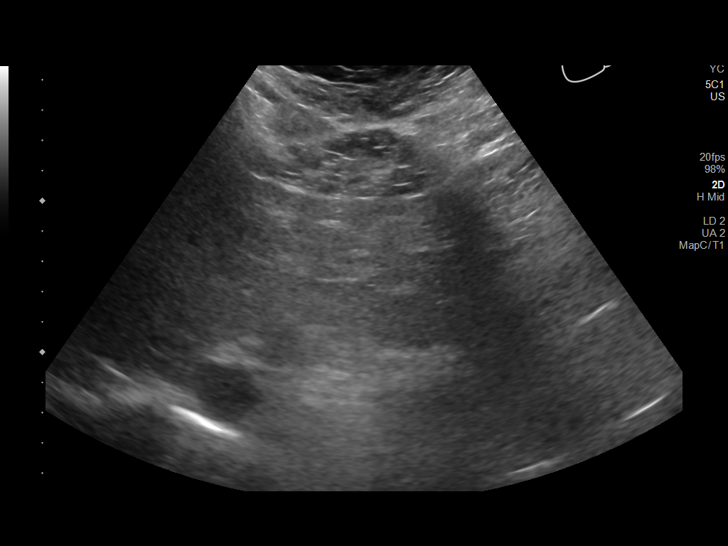
[im 40/60]
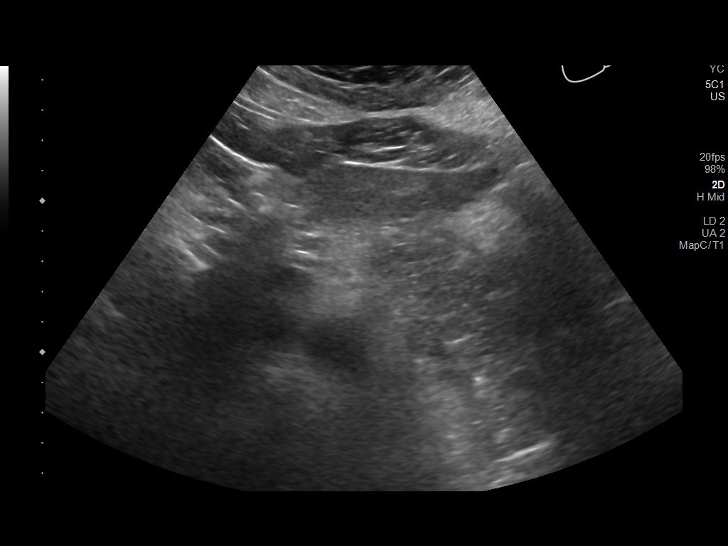
[im 45/60]
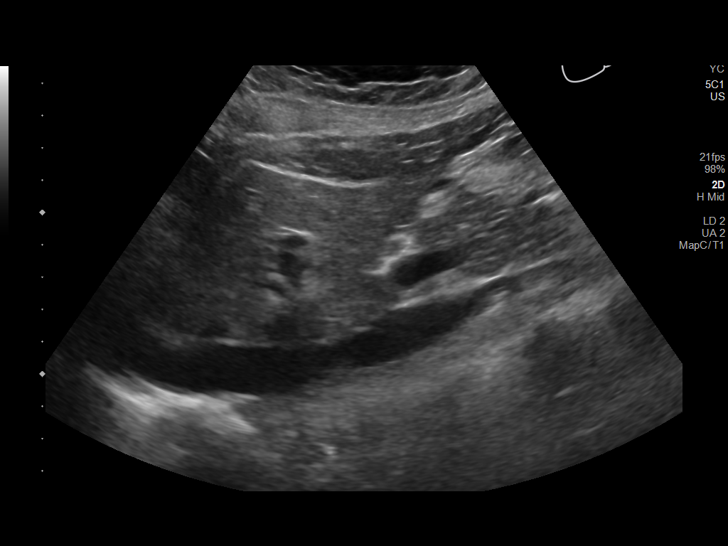
[im 50/60]
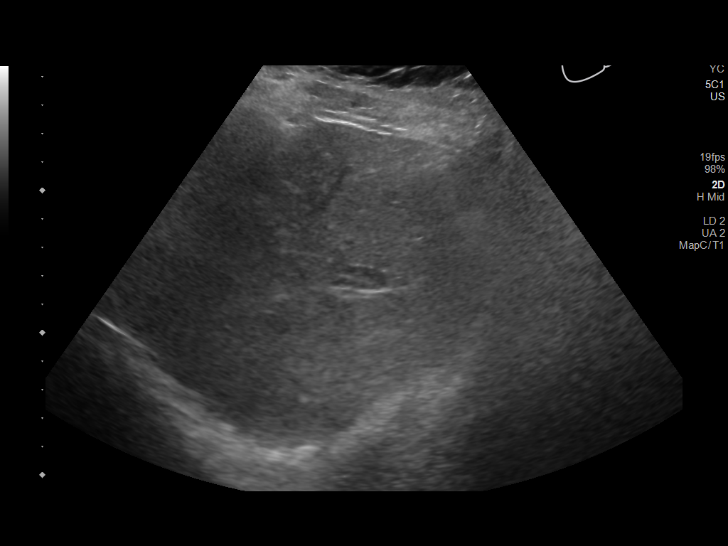
[im 55/60]
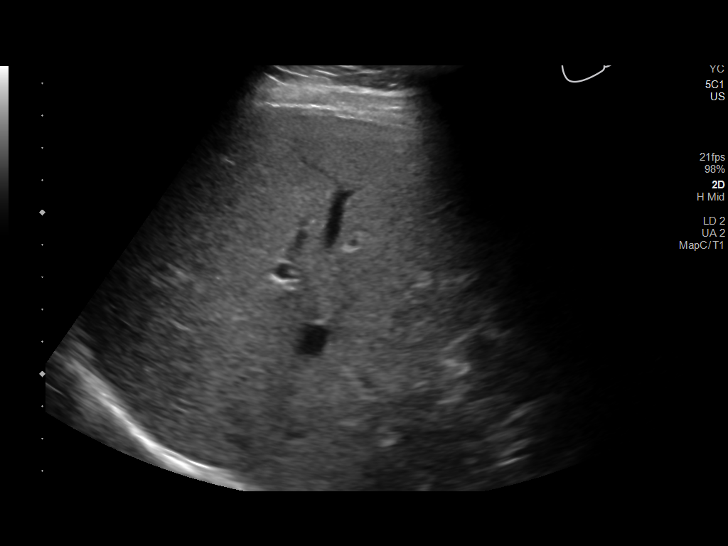
[im 60/60]
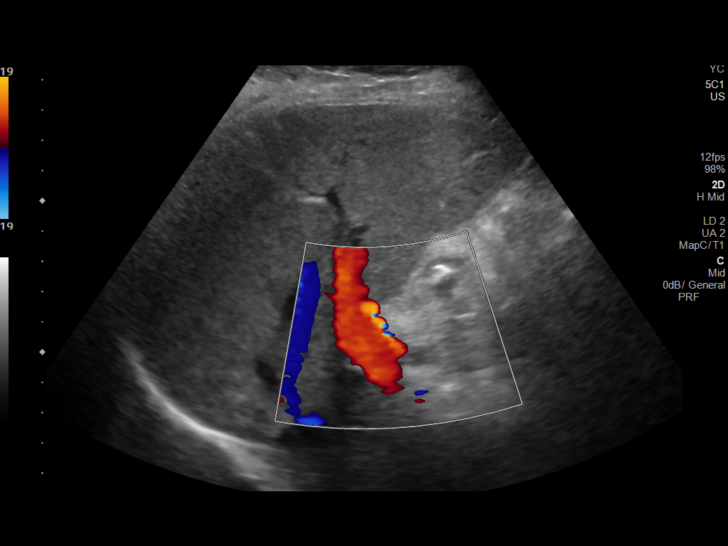

[14 of 25 positions shown; findings below may reference images not displayed]

FINDINGS: Gallbladder:

There is mild gallbladder wall thickening, measuring approximately
0.4 cm. Cholelithiasis is noted. The gallbladder is relatively
contracted. The sonographic Murphy's sign is reported as negative.

Common bile duct:

Diameter: 0.6-0.7 cm.

Liver:

No focal lesion identified. Within normal limits in parenchymal
echogenicity. Portal vein is patent on color Doppler imaging with
normal direction of blood flow towards the liver.
IMPRESSION: 1. Cholelithiasis.
2. Mild gallbladder wall thickening which is likely secondary to
underdistention in the absence of pericholecystic free fluid or a
positive sonographic Murphy sign.
3. Common bile duct diameter at the upper limits of normal.
Correlation with laboratory studies is recommended.

## 2022-11-17 HISTORY — PX: CYST EXCISION: SHX5701

## 2023-08-30 ENCOUNTER — Ambulatory Visit
Admission: EM | Admit: 2023-08-30 | Discharge: 2023-08-30 | Disposition: A | Discharge status: Home / Self Care | Payer: Medicaid Other | Attending: Internal Medicine | Admitting: Internal Medicine

## 2023-08-30 DIAGNOSIS — R070 Pain in throat: Secondary | ICD-10-CM | POA: Diagnosis present

## 2023-08-30 DIAGNOSIS — J069 Acute upper respiratory infection, unspecified: Secondary | ICD-10-CM

## 2023-08-30 LAB — POCT RAPID STREP A (OFFICE): Rapid Strep A Screen: NEGATIVE

## 2023-08-30 MED ORDER — IBUPROFEN 600 MG PO TABS: 600.0000 mg | ORAL_TABLET | Freq: Four times a day (QID) | ORAL | 0 refills | Status: DC | PRN

## 2023-08-30 MED ORDER — PSEUDOEPHEDRINE HCL 60 MG PO TABS
60.0000 mg | ORAL_TABLET | Freq: Three times a day (TID) | ORAL | 0 refills | Status: DC | PRN
Start: 2023-08-30 — End: 2024-03-31

## 2023-08-30 MED ORDER — PROMETHAZINE-DM 6.25-15 MG/5ML PO SYRP: 5.0000 mL | ORAL_SOLUTION | Freq: Three times a day (TID) | ORAL | 0 refills | Status: DC | PRN

## 2023-08-30 MED ORDER — CETIRIZINE HCL 10 MG PO TABS: 10.0000 mg | ORAL_TABLET | Freq: Every day | ORAL | 0 refills | Status: DC

## 2023-08-30 NOTE — ED Triage Notes (Signed)
Pt reports sore throat, body aches and swelling lymph nodes x 3 days. Difficulty swallowing food. Ibuprofen gives some relief.

## 2023-08-30 NOTE — ED Provider Notes (Signed)
[facility]  Note:  This document was prepared using Conservation officer, historic buildings and may include unintentional dictation errors.  MRN: [id number] DOB: 1976-03-03  Subjective:   Wanda Garcia is a 47 y.o. female presenting for 3-day history of runny and stuffy nose, throat pain, bilateral ear pain, lymph node pain, throat pain, coughing that elicits chest pain and wheezing, body pains.  Would like to be checked for strep.  Would like to undergo an antibiotic course.  No history of respiratory disorders.  Patient is a smoker.  No current facility-administered medications for this encounter.  Current Outpatient Medications:    ibuprofen (ADVIL) 200 MG tablet, Take 200 mg by mouth every 6 (six) hours as needed., Disp: , Rfl:    acetaminophen (TYLENOL) 325 MG tablet, Take 650 mg by mouth every 6 (six) hours as needed for mild pain, moderate pain or headache. , Disp: , Rfl:    oxyCODONE (OXY IR/ROXICODONE) 5 MG immediate release tablet, Take 1 tablet (5 mg total) by mouth every 6 (six) hours as needed for moderate pain or severe pain., Disp: 25 tablet, Rfl: 0   traMADol (ULTRAM) 50 MG tablet, Take 50 mg by mouth every 6 (six) hours as needed for moderate pain., Disp: , Rfl:    No Known Allergies  Past Medical History:  Diagnosis Date   Medical history non-contributory      Past Surgical History:  Procedure Laterality Date   CESAREAN SECTION N/A 04/06/2017   Procedure: CESAREAN SECTION;  Surgeon: Tereso Newcomer, MD;  Location: [facility];  Service: Obstetrics;  Laterality: N/A;   CHOLECYSTECTOMY N/A 04/26/2019   Procedure: LAPAROSCOPIC CHOLECYSTECTOMY WITH POSSIBLE INTRAOPERATIVE CHOLANGIOGRAM;  Surgeon: Abigail Miyamoto, MD;  Location: [facility] OR;  Service: General;  Laterality: N/A;   NO PAST SURGERIES      Family History  Problem Relation Age of Onset   Diabetes Father    Hypertension Father    Breast cancer Neg Hx     Social History    Tobacco Use   Smoking status: Every Day    Types: Cigarettes   Smokeless tobacco: Never   Tobacco comments:    smokes 0-2 / day  Vaping Use   Vaping status: Never Used  Substance Use Topics   Alcohol use: No   Drug use: No    ROS   Objective:   Vitals: BP 107/73 (BP Location: Left Arm)   Pulse 87   Temp 98.3 F (36.8 C) (Oral)   Resp 16   LMP  (Within Days)   SpO2 99%   Breastfeeding No   Physical Exam Constitutional:      General: She is not in acute distress.    Appearance: Normal appearance. She is well-developed and normal weight. She is not ill-appearing, toxic-appearing or diaphoretic.  HENT:     Head: Normocephalic and atraumatic.     Right Ear: Tympanic membrane, ear canal and external ear normal. No drainage or tenderness. No middle ear effusion. There is no impacted cerumen. Tympanic membrane is not erythematous or bulging.     Left Ear: Tympanic membrane, ear canal and external ear normal. No drainage or tenderness.  No middle ear effusion. There is no impacted cerumen. Tympanic membrane is not erythematous or bulging.     Nose: Congestion present. No rhinorrhea.     Mouth/Throat:     Mouth: Mucous membranes are moist. No oral lesions.     Pharynx: No pharyngeal swelling, oropharyngeal exudate, posterior oropharyngeal  erythema or uvula swelling.     Tonsils: No tonsillar exudate or tonsillar abscesses.     Comments: Significant postnasal drainage overlying pharynx. Eyes:     General: No scleral icterus.       Right eye: No discharge.        Left eye: No discharge.     Extraocular Movements: Extraocular movements intact.     Right eye: Normal extraocular motion.     Left eye: Normal extraocular motion.     Conjunctiva/sclera: Conjunctivae normal.  Cardiovascular:     Rate and Rhythm: Normal rate and regular rhythm.     Heart sounds: Normal heart sounds. No murmur heard.    No friction rub. No gallop.  Pulmonary:     Effort: Pulmonary effort is  normal. No respiratory distress.     Breath sounds: No stridor. No wheezing, rhonchi or rales.  Chest:     Chest wall: No tenderness.  Musculoskeletal:     Cervical back: Normal range of motion and neck supple.  Lymphadenopathy:     Cervical: No cervical adenopathy.  Skin:    General: Skin is warm and dry.  Neurological:     General: No focal deficit present.     Mental Status: She is alert and oriented to person, place, and time.  Psychiatric:        Mood and Affect: Mood normal.        Behavior: Behavior normal.     Results for orders placed or performed during the hospital encounter of 08/30/23 (from the past 24 hour(s))  POCT rapid strep A     Status: None   Collection Time: 08/30/23 12:30 PM  Result Value Ref Range   Rapid Strep A Screen Negative Negative    Assessment and Plan :   PDMP not reviewed this encounter.  1. Viral upper respiratory infection   2. Throat pain    Will manage for a viral upper respiratory infection with supportive care.  Strep culture pending.  Deferred imaging given clear cardiopulmonary exam, hemodynamically stable vital signs.  Discussed antibiotic stewardship and she was agreeable.  Counseled patient on potential for adverse effects with medications prescribed/recommended today, ER and return-to-clinic precautions discussed, patient verbalized understanding.    Wallis Bamberg, PA-C 08/30/23 1246

## 2023-08-30 NOTE — Discharge Instructions (Signed)
We will manage this as a viral upper respiratory infection/illness. For sore throat or cough try using a honey-based tea. Use 3 teaspoons of honey with juice squeezed from half lemon. Place shaved pieces of ginger into 1/2-1 cup of water and warm over stove top. Then mix the ingredients and repeat every 4 hours as needed. Please take ibuprofen 600mg  every 6 hours with food alternating with OR taken together with Tylenol 500mg -650mg  every 6 hours for throat pain, fevers, aches and pains. Hydrate very well with at least 2 liters of water. Eat light meals such as soups (chicken and noodles, vegetable, chicken and wild rice).  Do not eat foods that you are allergic to.  Taking an antihistamine like Zyrtec (10mg  daily) can help against postnasal drainage, sinus congestion which can cause sinus pain, sinus headaches, throat pain, painful swallowing, coughing.  You can take this together with pseudoephedrine (Sudafed) at a dose of 60 mg 3 times a day or twice daily as needed for the same kind of nasal drip, congestion.  Use cough syrup as needed.

## 2023-09-01 LAB — CULTURE, GROUP A STREP (THRC)

## 2023-09-02 ENCOUNTER — Other Ambulatory Visit: Payer: Self-pay | Admitting: Urgent Care

## 2023-09-02 MED ORDER — AMOXICILLIN 500 MG PO CAPS: 500.0000 mg | ORAL_CAPSULE | Freq: Two times a day (BID) | ORAL | 0 refills | Status: DC

## 2023-11-03 ENCOUNTER — Other Ambulatory Visit: Payer: Self-pay | Admitting: Surgery

## 2023-11-05 LAB — SURGICAL PATHOLOGY

## 2023-11-11 ENCOUNTER — Emergency Department (HOSPITAL_COMMUNITY)
Admission: EM | Admit: 2023-11-11 | Discharge: 2023-11-11 | Disposition: A | Discharge status: Home / Self Care | Payer: Medicaid Other | Attending: Emergency Medicine | Admitting: Emergency Medicine

## 2023-11-11 ENCOUNTER — Other Ambulatory Visit: Payer: Self-pay

## 2023-11-11 DIAGNOSIS — R222 Localized swelling, mass and lump, trunk: Secondary | ICD-10-CM | POA: Diagnosis present

## 2023-11-11 DIAGNOSIS — L0390 Cellulitis, unspecified: Secondary | ICD-10-CM

## 2023-11-11 MED ORDER — CEFAZOLIN SODIUM 1 G IM
1.0000 g | Freq: Once | INTRAMUSCULAR | Status: AC
  Administered 2023-11-11: 1 g via INTRAMUSCULAR
  Filled 2023-11-11: qty 1000

## 2023-11-11 MED ORDER — CEPHALEXIN 500 MG PO CAPS: 500.0000 mg | ORAL_CAPSULE | Freq: Four times a day (QID) | ORAL | 0 refills | Status: DC

## 2023-11-11 MED ORDER — HYDROXYZINE HCL 25 MG PO TABS
25.0000 mg | ORAL_TABLET | Freq: Once | ORAL | Status: AC
  Administered 2023-11-11: 25 mg via ORAL
  Filled 2023-11-11: qty 1

## 2023-11-11 NOTE — Discharge Instructions (Signed)
Call your surgeon tomorrow to schedule an appointment to be seen for a recheck

## 2023-11-11 NOTE — ED Triage Notes (Signed)
Pt arrived via POV. C/o swelling and warmth from surgical site below R clavicle. Surgery was 1x week ago. No drainage noted from incision

## 2023-11-11 NOTE — ED Provider Notes (Signed)
EMERGENCY DEPARTMENT AT [facility] Provider Note   CSN: [id number] Arrival date & time: 11/11/23  1113     History  Chief Complaint  Patient presents with   Post-op Problem    Wanda Garcia is a 47 y.o. female.  47 year old female presents with swelling to the right side of her chest.  Had a lipoma removed about a week ago.  States that she might have moved a certain direction yesterday now she has swelling to the area.  No drainage from the wound itself.  No fever or chills.  She denies being short of breath.       Home Medications Prior to Admission medications   Medication Sig Start Date End Date Taking? Authorizing Provider  acetaminophen (TYLENOL) 325 MG tablet Take 650 mg by mouth every 6 (six) hours as needed for mild pain, moderate pain or headache.     [provider]  amoxicillin (AMOXIL) 500 MG capsule Take 1 capsule (500 mg total) by mouth 2 (two) times daily. 09/02/23   Wallis Bamberg, PA-C  cetirizine (ZYRTEC ALLERGY) 10 MG tablet Take 1 tablet (10 mg total) by mouth daily. 08/30/23   Wallis Bamberg, PA-C  ibuprofen (ADVIL) 600 MG tablet Take 1 tablet (600 mg total) by mouth every 6 (six) hours as needed. 08/30/23   Wallis Bamberg, PA-C  oxyCODONE (OXY IR/ROXICODONE) 5 MG immediate release tablet Take 1 tablet (5 mg total) by mouth every 6 (six) hours as needed for moderate pain or severe pain. 04/26/19   Abigail Miyamoto, MD  promethazine-dextromethorphan (PROMETHAZINE-DM) 6.25-15 MG/5ML syrup Take 5 mLs by mouth 3 (three) times daily as needed for cough. 08/30/23   Wallis Bamberg, PA-C  pseudoephedrine (SUDAFED) 60 MG tablet Take 1 tablet (60 mg total) by mouth every 8 (eight) hours as needed for congestion. 08/30/23   Wallis Bamberg, PA-C  traMADol (ULTRAM) 50 MG tablet Take 50 mg by mouth every 6 (six) hours as needed for moderate pain.    [provider]      Allergies    Patient has no known allergies.    Review of Systems    Review of Systems  All other systems reviewed and are negative.   Physical Exam Updated Vital Signs BP 118/85 (BP Location: Left Arm)   Pulse 97   Temp 98.5 F (36.9 C) (Oral)   Resp 18   Ht 1.524 m (5')   Wt 63 kg   SpO2 99%   BMI 27.13 kg/m  Physical Exam Vitals and nursing note reviewed.  Constitutional:      General: She is not in acute distress.    Appearance: Normal appearance. She is well-developed. She is not toxic-appearing.  HENT:     Head: Normocephalic and atraumatic.  Eyes:     General: Lids are normal.     Conjunctiva/sclera: Conjunctivae normal.     Pupils: Pupils are equal, round, and reactive to light.  Neck:     Thyroid: No thyroid mass.     Trachea: No tracheal deviation.  Cardiovascular:     Rate and Rhythm: Normal rate and regular rhythm.     Heart sounds: Normal heart sounds. No murmur heard.    No gallop.  Pulmonary:     Effort: Pulmonary effort is normal. No respiratory distress.     Breath sounds: Normal breath sounds. No stridor. No decreased breath sounds, wheezing, rhonchi or rales.  Chest:    Abdominal:     General: There is no  distension.     Palpations: Abdomen is soft.     Tenderness: There is no abdominal tenderness. There is no rebound.  Musculoskeletal:        General: No tenderness. Normal range of motion.     Cervical back: Normal range of motion and neck supple.  Skin:    General: Skin is warm and dry.     Findings: No abrasion or rash.  Neurological:     Mental Status: She is alert and oriented to person, place, and time. Mental status is at baseline.     GCS: GCS eye subscore is 4. GCS verbal subscore is 5. GCS motor subscore is 6.     Cranial Nerves: No cranial nerve deficit.     Sensory: No sensory deficit.     Motor: Motor function is intact.  Psychiatric:        Attention and Perception: Attention normal.        Speech: Speech normal.        Behavior: Behavior normal.     ED Results / Procedures / Treatments    Labs (all labs ordered are listed, but only abnormal results are displayed) Labs Reviewed  CBC  BASIC METABOLIC PANEL  HCG, SERUM, QUALITATIVE    EKG None  Radiology No results found.  Procedures Procedures    Medications Ordered in ED Medications - No data to display  ED Course/ Medical Decision Making/ A&P                                 Medical Decision Making Amount and/or Complexity of Data Reviewed Labs: ordered.   Patient with likely early postoperative infection.  Will place her on Keflex.  Do not feel x-rays are needed at this time.  Do not feel that this needs to be be incised.  Return precautions given        Final Clinical Impression(s) / ED Diagnoses Final diagnoses:  None    Rx / DC Orders ED Discharge Orders     None         Lorre Nick, MD 11/11/23 1148

## 2024-03-17 DIAGNOSIS — C50919 Malignant neoplasm of unspecified site of unspecified female breast: Secondary | ICD-10-CM

## 2024-03-17 HISTORY — PX: BREAST BIOPSY: SHX20

## 2024-03-17 HISTORY — DX: Malignant neoplasm of unspecified site of unspecified female breast: C50.919

## 2024-03-22 ENCOUNTER — Other Ambulatory Visit: Payer: Self-pay

## 2024-03-23 ENCOUNTER — Telehealth: Payer: Self-pay | Admitting: *Deleted

## 2024-03-23 ENCOUNTER — Encounter: Payer: Self-pay | Admitting: *Deleted

## 2024-03-23 LAB — SURGICAL PATHOLOGY

## 2024-03-23 NOTE — Telephone Encounter (Signed)
 Confirmed [facility] for 03/30/24 at 8:30am .  Instructions and contact information given.

## 2024-03-28 ENCOUNTER — Encounter: Payer: Self-pay | Admitting: *Deleted

## 2024-03-28 DIAGNOSIS — C50412 Malignant neoplasm of upper-outer quadrant of left female breast: Secondary | ICD-10-CM | POA: Insufficient documentation

## 2024-03-29 NOTE — Progress Notes (Signed)
 Radiation Oncology         [phone] ________________________________  Initial Outpatient Consultation  Name: Wanda Garcia MRN: [id number]  Date: 03/30/2024  DOB: 1976-01-04  WU:[id number], Maile Score, NP  Lockie Rima, MD   REFERRING PHYSICIAN: Lockie Rima, MD  DIAGNOSIS: No diagnosis found.   Cancer Staging  No matching staging information was found for the patient.   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 female who presented with breast abnormality on the following imaging: *** on the date of ***.  Symptoms, if any, at that time, were: ***.   Ultrasound of breast on *** revealed ***.     Biopsies collected on 03/22/24 are detailed as follows:  -- Biopsy of the 3 o'clock left breast at 4 cmfn showed grade 1 invasive ductal carcinoma measuring 6 mm in the greatest linear extent of the sample with intermediate grade DCIS. ER status: 95% positive and PR status 100% positive, both with strong staining intensity; Proliferation marker Ki67 at 15%; Her2 status negative; Grade 1. -- Biopsy of the 2 o'clock left breast at 5 cmfn showed grade 1 invasive ductal carcinoma measuring 7 mm in the greatest linear extent of the sample. -- Biopsy of the left axillary lymph node also came back positive for carcinoma with no residual lymphoid tissue identified (possibly representing an entirely replaced lymph node).   Of note: She has a lipoma excised from her right upper chest by Dr. Afton Horse this past January. Post-exicision, she developed a seroma that got infected and required aspiration. The area has since resolved. It looks as though she also had a lipoma excised from her neck at that time.   ***  PREVIOUS RADIATION THERAPY: No  PAST MEDICAL HISTORY:  has a  past medical history of Medical history non-contributory.    PAST SURGICAL HISTORY: Past Surgical History:  Procedure Laterality Date   CESAREAN SECTION N/A 04/06/2017   Procedure: CESAREAN SECTION;  Surgeon: Julianne Octave, MD;  Location: [facility];  Service: Obstetrics;  Laterality: N/A;   CHOLECYSTECTOMY N/A 04/26/2019   Procedure: LAPAROSCOPIC CHOLECYSTECTOMY WITH POSSIBLE INTRAOPERATIVE CHOLANGIOGRAM;  Surgeon: Oza Blumenthal, MD;  Location: [facility];  Service: General;  Laterality: N/A;   NO PAST SURGERIES      FAMILY HISTORY: family history includes Diabetes in her father; Hypertension in her father.  SOCIAL HISTORY:  reports that she has been smoking cigarettes. She has never used smokeless tobacco. She reports that she does not drink alcohol and does not use drugs.  ALLERGIES: Patient has no known allergies.  MEDICATIONS:  Current Outpatient Medications  Medication Sig Dispense Refill   acetaminophen  (TYLENOL ) 325 MG tablet Take 650 mg by mouth every 6 (six) hours as needed for mild pain, moderate pain or headache.      amoxicillin  (AMOXIL ) 500 MG capsule Take 1 capsule (500 mg total) by mouth 2 (two) times daily. 20 capsule 0   cephALEXin  (KEFLEX ) 500 MG capsule Take 1 capsule (500 mg total) by mouth 4 (four) times daily. 20 capsule 0   cetirizine  (ZYRTEC  ALLERGY) 10 MG tablet Take 1 tablet (10 mg total) by mouth daily. 30 tablet 0   ibuprofen  (ADVIL ) 600 MG tablet Take 1 tablet (600 mg total) by mouth every 6 (six) hours as needed. 30  tablet 0   oxyCODONE  (OXY IR/ROXICODONE ) 5 MG immediate release tablet Take 1 tablet (5 mg total) by mouth every 6 (six) hours as needed for moderate pain or severe pain. 25 tablet 0   promethazine -dextromethorphan (PROMETHAZINE -DM) 6.25-15 MG/5ML syrup Take 5 mLs by mouth 3 (three) times daily as needed for cough. 200 mL 0   pseudoephedrine  (SUDAFED) 60 MG tablet Take 1 tablet (60 mg total) by mouth every 8 (eight) hours as needed for  congestion. 30 tablet 0   traMADol (ULTRAM) 50 MG tablet Take 50 mg by mouth every 6 (six) hours as needed for moderate pain.     No current facility-administered medications for this encounter.    REVIEW OF SYSTEMS: As above in HPI.   PHYSICAL EXAM:  vitals were not taken for this visit.   General: Alert and oriented, in no acute distress HEENT: Head is normocephalic. Extraocular movements are intact.  Heart: Regular in rate and rhythm with no murmurs, rubs, or gallops. Chest: Clear to auscultation bilaterally, with no rhonchi, wheezes, or rales. Abdomen: Soft, nontender, nondistended, with no rigidity or guarding. Extremities: No cyanosis or edema. Skin: No concerning lesions. Musculoskeletal: symmetric strength and muscle tone throughout. Neurologic: Cranial nerves II through XII are grossly intact. No obvious focalities. Speech is fluent. Coordination is intact. Psychiatric: Judgment and insight are intact. Affect is appropriate. Breasts: *** . No other palpable masses appreciated in the breasts or axillae *** .    ECOG = ***  0 - Asymptomatic (Fully active, able to carry on all predisease activities without restriction)  1 - Symptomatic but completely ambulatory (Restricted in physically strenuous activity but ambulatory and able to carry out work of a light or sedentary nature. For example, light housework, office work)  2 - Symptomatic, <50% in bed during the day (Ambulatory and capable of all self care but unable to carry out any work activities. Up and about more than 50% of waking hours)  3 - Symptomatic, >50% in bed, but not bedbound (Capable of only limited self-care, confined to bed or chair 50% or more of waking hours)  4 - Bedbound (Completely disabled. Cannot carry on any self-care. Totally confined to bed or chair)  5 - Death   Aurea Blossom MM, Creech RH, Tormey DC, et al. [phone]). "Toxicity and response criteria of the [facility]". Am. Hillard Lowes.  Oncol. 5 (6): 649-55   LABORATORY DATA:   CBC    Component Value Date/Time   WBC 6.8 04/25/2019 1341   RBC 5.49 (H) 04/25/2019 1341   HGB 13.6 04/25/2019 1341   HGB 12.7 06/25/2018 1507   HCT 44.0 04/25/2019 1341   HCT 43.3 06/25/2018 1507   PLT 369 04/25/2019 1341   PLT 369 06/25/2018 1507   MCV 80.1 04/25/2019 1341   MCV 79 06/25/2018 1507   MCH 24.8 (L) 04/25/2019 1341   MCHC 30.9 04/25/2019 1341   RDW 14.1 04/25/2019 1341   RDW 14.4 06/25/2018 1507   LYMPHSABS 2.7 03/23/2019 1550   MONOABS 0.4 03/23/2019 1550   EOSABS 0.1 03/23/2019 1550   BASOSABS 0.0 03/23/2019 1550    CMP     Component Value Date/Time   NA 137 03/23/2019 1550   NA 142 06/25/2018 1507   K 5.2 (H) 03/23/2019 1550   CL 108 03/23/2019 1550   CO2 22 03/23/2019 1550   GLUCOSE 105 (H) 03/23/2019 1550   BUN 9 03/23/2019 1550   BUN 10 06/25/2018 1507   CREATININE 0.62 03/23/2019  1550   CALCIUM 9.0 03/23/2019 1550   PROT 6.7 03/23/2019 1550   PROT 7.0 06/25/2018 1507   ALBUMIN 3.9 03/23/2019 1550   ALBUMIN 4.6 06/25/2018 1507   AST 37 03/23/2019 1550   ALT 55 (H) 03/23/2019 1550   ALKPHOS 53 03/23/2019 1550   BILITOT 1.0 03/23/2019 1550   BILITOT <0.2 06/25/2018 1507   GFRNONAA >60 03/23/2019 1550      RADIOGRAPHY: No results found.    IMPRESSION/PLAN: ***   It was a pleasure meeting the patient today. We discussed the risks, benefits, and side effects of radiotherapy. I recommend radiotherapy to the *** to reduce her risk of locoregional recurrence by 2/3.  We discussed that radiation would take approximately *** weeks to complete and that I would give the patient a few weeks to heal following surgery before starting treatment planning. *** If chemotherapy were to be given, this would precede radiotherapy. We spoke about acute effects including skin irritation and fatigue as well as much less common late effects including internal organ injury or irritation. We spoke about the latest technology  that is used to minimize the risk of late effects for patients undergoing radiotherapy to the breast or chest wall. No guarantees of treatment were given. The patient is enthusiastic about proceeding with treatment. I look forward to participating in the patient's care.  I will await her referral back to me for postoperative follow-up and eventual CT simulation/treatment planning.  On date of service, in total, I spent *** minutes on this encounter. Patient was seen in person.   __________________________________________   Colie Dawes, MD  This document serves as a record of services personally performed by Colie Dawes, MD. It was created on her behalf by Aleta Anda, a trained medical scribe. The creation of this record is based on the scribe's personal observations and the provider's statements to them. This document has been checked and approved by the attending provider.

## 2024-03-30 ENCOUNTER — Ambulatory Visit: Attending: General Surgery | Admitting: Physical Therapy

## 2024-03-30 ENCOUNTER — Other Ambulatory Visit: Payer: Self-pay

## 2024-03-30 ENCOUNTER — Other Ambulatory Visit: Payer: Self-pay | Admitting: General Surgery

## 2024-03-30 ENCOUNTER — Inpatient Hospital Stay

## 2024-03-30 ENCOUNTER — Encounter: Payer: Self-pay | Admitting: *Deleted

## 2024-03-30 ENCOUNTER — Inpatient Hospital Stay (HOSPITAL_BASED_OUTPATIENT_CLINIC_OR_DEPARTMENT_OTHER): Admitting: Genetic Counselor

## 2024-03-30 ENCOUNTER — Inpatient Hospital Stay: Admitting: Hematology and Oncology

## 2024-03-30 ENCOUNTER — Encounter: Payer: Self-pay | Admitting: Physical Therapy

## 2024-03-30 ENCOUNTER — Ambulatory Visit
Admission: RE | Admit: 2024-03-30 | Discharge: 2024-03-30 | Disposition: A | Discharge status: Home / Self Care | Source: Ambulatory Visit | Attending: Radiation Oncology | Admitting: Radiation Oncology

## 2024-03-30 ENCOUNTER — Other Ambulatory Visit: Payer: Self-pay | Admitting: *Deleted

## 2024-03-30 ENCOUNTER — Encounter: Payer: Self-pay | Admitting: Radiation Oncology

## 2024-03-30 ENCOUNTER — Inpatient Hospital Stay: Attending: Hematology and Oncology | Admitting: Licensed Clinical Social Worker

## 2024-03-30 VITALS — BP 140/92 | HR 97 | Temp 98.4°F | Resp 18 | Ht 60.0 in | Wt 162.6 lb

## 2024-03-30 DIAGNOSIS — C50412 Malignant neoplasm of upper-outer quadrant of left female breast: Secondary | ICD-10-CM

## 2024-03-30 DIAGNOSIS — Z170 Estrogen receptor positive status [ER+]: Secondary | ICD-10-CM | POA: Insufficient documentation

## 2024-03-30 DIAGNOSIS — Z1721 Progesterone receptor positive status: Secondary | ICD-10-CM | POA: Diagnosis not present

## 2024-03-30 DIAGNOSIS — Z1732 Human epidermal growth factor receptor 2 negative status: Secondary | ICD-10-CM | POA: Diagnosis not present

## 2024-03-30 DIAGNOSIS — R293 Abnormal posture: Secondary | ICD-10-CM | POA: Insufficient documentation

## 2024-03-30 DIAGNOSIS — F17210 Nicotine dependence, cigarettes, uncomplicated: Secondary | ICD-10-CM | POA: Diagnosis not present

## 2024-03-30 LAB — CMP (CANCER CENTER ONLY)
ALT: 10 U/L (ref 0–44)
AST: 15 U/L (ref 15–41)
Albumin: 4.7 g/dL (ref 3.5–5.0)
Alkaline Phosphatase: 51 U/L (ref 38–126)
Anion gap: 4 — ABNORMAL LOW (ref 5–15)
BUN: 10 mg/dL (ref 6–20)
CO2: 24 mmol/L (ref 22–32)
Calcium: 9.5 mg/dL (ref 8.9–10.3)
Chloride: 106 mmol/L (ref 98–111)
Creatinine: 0.64 mg/dL (ref 0.44–1.00)
GFR, Estimated: >60 mL/min (ref >60)
Glucose, Bld: 99 mg/dL (ref 70–99)
Potassium: 4.5 mmol/L (ref 3.5–5.1)
Sodium: 134 mmol/L — ABNORMAL LOW (ref 135–145)
Total Bilirubin: 0.3 mg/dL (ref 0.0–1.2)
Total Protein: 7.9 g/dL (ref 6.5–8.1)

## 2024-03-30 LAB — CBC WITH DIFFERENTIAL (CANCER CENTER ONLY)
Abs Immature Granulocytes: 0.02 10*3/uL (ref 0.00–0.07)
Basophils Absolute: 0 10*3/uL (ref 0.0–0.1)
Basophils Relative: 1 %
Eosinophils Absolute: 0.1 10*3/uL (ref 0.0–0.5)
Eosinophils Relative: 2 %
HCT: 43.2 % (ref 36.0–46.0)
Hemoglobin: 13.8 g/dL (ref 12.0–15.0)
Immature Granulocytes: 0 %
Lymphocytes Relative: 24 %
Lymphs Abs: 2.1 10*3/uL (ref 0.7–4.0)
MCH: 24.8 pg — ABNORMAL LOW (ref 26.0–34.0)
MCHC: 31.9 g/dL (ref 30.0–36.0)
MCV: 77.6 fL — ABNORMAL LOW (ref 80.0–100.0)
Monocytes Absolute: 0.5 10*3/uL (ref 0.1–1.0)
Monocytes Relative: 6 %
Neutro Abs: 5.7 10*3/uL (ref 1.7–7.7)
Neutrophils Relative %: 67 %
Platelet Count: 243 10*3/uL (ref 150–400)
RBC: 5.57 MIL/uL — ABNORMAL HIGH (ref 3.87–5.11)
RDW: 14.1 % (ref 11.5–15.5)
Smear Review: NORMAL
WBC Count: 8.5 10*3/uL (ref 4.0–10.5)
nRBC: 0 % (ref 0.0–0.2)

## 2024-03-30 LAB — PREGNANCY, URINE: Preg Test, Ur: NEGATIVE

## 2024-03-30 MED ORDER — LIDOCAINE-PRILOCAINE 2.5-2.5 % EX CREA: TOPICAL_CREAM | CUTANEOUS | 3 refills | Status: DC

## 2024-03-30 MED ORDER — DEXAMETHASONE 4 MG PO TABS: ORAL_TABLET | ORAL | 1 refills | Status: DC

## 2024-03-30 MED ORDER — ONDANSETRON HCL 8 MG PO TABS: ORAL_TABLET | ORAL | 1 refills | Status: DC

## 2024-03-30 MED ORDER — PROCHLORPERAZINE MALEATE 10 MG PO TABS: 10.0000 mg | ORAL_TABLET | Freq: Four times a day (QID) | ORAL | 1 refills | Status: DC | PRN

## 2024-03-30 NOTE — Therapy (Signed)
 OUTPATIENT PHYSICAL THERAPY BREAST CANCER BASELINE EVALUATION   Patient Name: Wanda Garcia MRN: [id number] DOB:03-03-1976, 48 y.o., female Today's Date: 03/30/2024  END OF SESSION:  PT End of Session - 03/30/24 1227     Visit Number 1    Number of Visits 2    Date for PT Re-Evaluation 09/30/24    PT Start Time 1138    PT Stop Time 1213    PT Time Calculation (min) 35 min    Activity Tolerance Patient tolerated treatment well    Behavior During Therapy [facility] for tasks assessed/performed             Past Medical History:  Diagnosis Date   Medical history non-contributory    Past Surgical History:  Procedure Laterality Date   CESAREAN SECTION N/A 04/06/2017   Procedure: CESAREAN SECTION;  Surgeon: Wanda Octave, MD;  Location: [facility];  Service: Obstetrics;  Laterality: N/A;   CHOLECYSTECTOMY N/A 04/26/2019   Procedure: LAPAROSCOPIC CHOLECYSTECTOMY WITH POSSIBLE INTRAOPERATIVE CHOLANGIOGRAM;  Surgeon: Wanda Blumenthal, MD;  Location: [facility];  Service: General;  Laterality: N/A;   NO PAST SURGERIES     Patient Active Problem List   Diagnosis Date Noted   Malignant neoplasm of upper-outer quadrant of left breast in female, estrogen receptor positive (HCC) 03/28/2024   Neck pain 07/09/2018   Trigger finger of left thumb 07/09/2018   Pain in left wrist 07/09/2018   Pain in right wrist 07/09/2018   S/P cesarean section 04/05/2017   Advanced maternal age, primigravida in first trimester, antepartum    REFERRING PROVIDER: Dr. Lockie Garcia  REFERRING DIAG: Left breast cancer  THERAPY DIAG:  Malignant neoplasm of upper-outer quadrant of left breast in female, estrogen receptor positive (HCC)  Abnormal posture  Rationale for Evaluation and Treatment: Rehabilitation  ONSET DATE: 03/08/2024  SUBJECTIVE:                                                                                                                                                                                            SUBJECTIVE STATEMENT: Patient reports she is here today to be seen by her medical team for her newly diagnosed left breast cancer.   PERTINENT HISTORY:  Patient was diagnosed on 03/08/2024 with left grade 1 invasive ductal carcinoma breast cancer. It measures 2.2 cm and 1 cm and is located in the upper outer quadrant. It is ER/PR positive and HER2 negative with a Ki67 of 15%. She speaks Arabic and has an autistic 87 y.o. son that requires her care.  PATIENT GOALS:   reduce lymphedema risk and learn post op HEP.   PAIN:  Are you having pain? No  PRECAUTIONS: Active CA   RED FLAGS: None   HAND DOMINANCE: right  WEIGHT BEARING RESTRICTIONS: No  FALLS:  Has patient fallen in last 6 months? No  LIVING ENVIRONMENT: Patient lives with: her husband and 35 y.o. son Lives in: House/apartment Has following equipment at home: None  OCCUPATION: Unemployed; cares for son  LEISURE: She does not exercise  PRIOR LEVEL OF FUNCTION: Independent   OBJECTIVE: Note: Objective measures were completed at Evaluation unless otherwise noted.  COGNITION: Overall cognitive status: Within functional limits for tasks assessed    POSTURE:  Forward head and rounded shoulders posture  UPPER EXTREMITY AROM/PROM:  A/PROM RIGHT   eval   Shoulder extension 47  Shoulder flexion 149  Shoulder abduction 154  Shoulder internal rotation 55  Shoulder external rotation 80    (Blank rows = not tested)  A/PROM LEFT   eval  Shoulder extension 40  Shoulder flexion 147  Shoulder abduction 167  Shoulder internal rotation 56  Shoulder external rotation 84    (Blank rows = not tested)  CERVICAL AROM: All within normal limits   UPPER EXTREMITY STRENGTH: WNL  LYMPHEDEMA ASSESSMENTS (in cm):   LANDMARK RIGHT   eval  10 cm proximal to olecranon process 30.2  Olecranon process 24  10 cm proximal to ulnar styloid process 21.5  Just proximal to ulnar styloid process 14.2   Across hand at thumb web space 17.3  At base of 2nd digit 5.9  (Blank rows = not tested)  LANDMARK LEFT   eval  10 cm proximal to olecranon process 28.8  Olecranon process 22.9  10 cm proximal to ulnar styloid process 20.8  Just proximal to ulnar styloid process 14.1  Across hand at thumb web space 17.5  At base of 2nd digit 6  (Blank rows = not tested)  L-DEX LYMPHEDEMA SCREENING:  The patient was assessed using the L-Dex machine today to produce a lymphedema index baseline score. The patient will be reassessed on a regular basis (typically every 3 months) to obtain new L-Dex scores. If the score is > 6.5 points away from his/her baseline score indicating onset of subclinical lymphedema, it will be recommended to wear a compression garment for 4 weeks, 12 hours per day and then be reassessed. If the score continues to be > 6.5 points from baseline at reassessment, we will initiate lymphedema treatment. Assessing in this manner has a 95% rate of preventing clinically significant lymphedema.   L-DEX FLOWSHEETS - 03/30/24 1200       L-DEX LYMPHEDEMA SCREENING   Measurement Type Unilateral    L-DEX MEASUREMENT EXTREMITY Upper Extremity    POSITION  Standing    DOMINANT SIDE Right    At Risk Side Left    BASELINE SCORE (UNILATERAL) -0.4             QUICK DASH SURVEY:  Wanda Garcia - 03/30/24 0001     Open a tight or new jar No difficulty    Do heavy household chores (wash walls, wash floors) No difficulty    Carry a shopping bag or briefcase No difficulty    Wash your back No difficulty    Use a knife to cut food No difficulty    Recreational activities in which you take some force or impact through your arm, shoulder, or hand (golf, hammering, tennis) No difficulty    During the past week, to what extent has your arm, shoulder or hand problem interfered with your  normal social activities with family, friends, neighbors, or groups? Not at all    During the past week, to what  extent has your arm, shoulder or hand problem limited your work or other regular daily activities Not at all    Arm, shoulder, or hand pain. Moderate    Tingling (pins and needles) in your arm, shoulder, or hand None    Difficulty Sleeping No difficulty    DASH Score 4.55 %              PATIENT EDUCATION:  Education details: Time spent educating patient on aspects of self-care to maximize post op recovery. Patient was educated on where and how to get a post op compression bra to use to reduce post op edema. Patient was also educated on the use of SOZO screenings and surveillance principles for early identification of lymphedema onset. She was instructed to use the post op pillow in the axilla for pressure and pain relief. Patient educated on lymphedema risk reduction and post op shoulder/posture HEP. Person educated: Patient Education method: Explanation, Demonstration, Handout Education comprehension: Patient verbalized understanding and returned demonstration  HOME EXERCISE PROGRAM: Patient was instructed today in a home exercise program today for post op shoulder range of motion. These included active assist shoulder flexion in sitting, scapular retraction, wall walking with shoulder abduction, and hands behind head external rotation.  She was encouraged to do these twice a day, holding 3 seconds and repeating 5 times when permitted by her physician.   ASSESSMENT:  CLINICAL IMPRESSION: Patient was diagnosed on 03/08/2024 with left grade 1 invasive ductal carcinoma breast cancer. It measures 2.2 cm and 1 cm and is located in the upper outer quadrant. It is ER/PR positive and HER2 negative with a Ki67 of 15%. She speaks Arabic and has an autistic 61 y.o. son that requires her care.Her multidisciplinary medical team met prior to her assessments to determine a recommended treatment plan. She is planning to have neoadjuvant chemotherapy followed by a left lumpectomy or mastectomy and targeted  axillary lymph node dissection, radiation, and anti-estrogen therapy. She will benefit from a post op PT reassessment to determine needs and from L-Dex screens every 3 months for 2 years to detect subclinical lymphedema.  Pt will benefit from skilled therapeutic intervention to improve on the following deficits: Decreased knowledge of precautions, impaired UE functional use, pain, decreased ROM, postural dysfunction.   PT treatment/interventions: ADL/self-care home management, pt/family education, therapeutic exercise  REHAB POTENTIAL: Excellent  CLINICAL DECISION MAKING: Stable/uncomplicated  EVALUATION COMPLEXITY: Low   GOALS: Goals reviewed with patient? YES  LONG TERM GOALS: (STG=LTG)    Name Target Date Goal status  1 Pt will be able to verbalize understanding of pertinent lymphedema risk reduction practices relevant to her dx specifically related to skin care.  Baseline:  No knowledge 03/30/2024 Achieved at eval  2 Pt will be able to return demo and/or verbalize understanding of the post op HEP related to regaining shoulder ROM. Baseline:  No knowledge 03/30/2024 Achieved at eval  3 Pt will be able to verbalize understanding of the importance of viewing the post op After Breast CA Class video for further lymphedema risk reduction education and therapeutic exercise.  Baseline:  No knowledge 03/30/2024 Achieved at eval  4 Pt will demo she has regained full shoulder ROM and function post operatively compared to baselines.  Baseline: See objective measurements taken today. 09/30/2024     PLAN:  PT FREQUENCY/DURATION: EVAL and 1 follow up appointment.  PLAN FOR NEXT SESSION: will reassess 3-4 weeks post op to determine needs.   Patient will follow up at outpatient cancer rehab 3-4 weeks following surgery.  If the patient requires physical therapy at that time, a specific plan will be dictated and sent to the referring physician for approval. The patient was educated today on  appropriate basic range of motion exercises to begin post operatively and the importance of viewing the After Breast Cancer class video following surgery.  Patient was educated today on lymphedema risk reduction practices as it pertains to recommendations that will benefit the patient immediately following surgery.  She verbalized good understanding.    Physical Therapy Information for After Breast Cancer Surgery/Treatment:  Lymphedema is a swelling condition that you may be at risk for in your arm if you have lymph nodes removed from the armpit area.  After a sentinel node biopsy, the risk is approximately 5-9% and is higher after an axillary node dissection.  There is treatment available for this condition and it is not life-threatening.  Contact your physician or physical therapist with concerns. You may begin the 4 shoulder/posture exercises (see additional sheet) when permitted by your physician (typically a week after surgery).  If you have drains, you may need to wait until those are removed before beginning range of motion exercises.  A general recommendation is to not lift your arms above shoulder height until drains are removed.  These exercises should be done to your tolerance and gently.  This is not a "no pain/no gain" type of recovery so listen to your body and stretch into the range of motion that you can tolerate, stopping if you have pain.  If you are having immediate reconstruction, ask your plastic surgeon about doing exercises as he or she may want you to wait. We encourage you to view the After Breast Cancer class video following surgery.  You will learn information related to lymphedema risk, prevention and treatment and additional exercises to regain mobility following surgery.   While undergoing any medical procedure or treatment, try to avoid blood pressure being taken or needle sticks from occurring on the arm on the side of cancer.   This recommendation begins after surgery and  continues for the rest of your life.  This may help reduce your risk of getting lymphedema (swelling in your arm). An excellent resource for those seeking information on lymphedema is the National Lymphedema Network's web site. It can be accessed at www.lymphnet.org If you notice swelling in your hand, arm or breast at any time following surgery (even if it is many years from now), please contact your doctor or physical therapist to discuss this.  Lymphedema can be treated at any time but it is easier for you if it is treated early on.  If you feel like your shoulder motion is not returning to normal in a reasonable amount of time, please contact your surgeon or physical therapist.  [facility] [phone]. [address], [place] [address]  [facility] CLASS After Breast Cancer Class  After Breast Cancer Class is a specially designed exercise class video to assist you in a safe recover after having breast cancer surgery.  In this video you will learn how to get back to full function whether your drains were just removed or if you had surgery a month ago. The video can be viewed on this page: [email or web address].boyd-meyer.org/ or on YouTube here: https://[email or web address].  Class Goals  Understand specific stretches to  improve the flexibility of you chest and shoulder. Learn ways to safely strengthen your upper body and improve your posture. Understand the warning signs of infection and why you may be at risk for an arm infection. Learn about Lymphedema and prevention.  ** You do not need to view this video until after surgery.  Drains should be removed to participate in the recommended exercises on the video.  Patient was instructed today in a home exercise program today for post op shoulder range of motion. These included active assist shoulder flexion in sitting, scapular retraction, wall walking  with shoulder abduction, and hands behind head external rotation.  She was encouraged to do these twice a day, holding 3 seconds and repeating 5 times when permitted by her physician.  Rollin Clock, [place] 03/30/24 12:35 PM

## 2024-03-30 NOTE — Progress Notes (Signed)
 START ON PATHWAY REGIMEN - Breast     Cycles 1 through 4: A cycle is every 14 days:     Doxorubicin      Cyclophosphamide      [id number]-[id number]    Cycles 5 through 16: A cycle is every 7 days:     Paclitaxel   **Always confirm dose/schedule in your pharmacy ordering system**  Patient Characteristics: Preoperative or Nonsurgical Candidate, M0 (Clinical Staging), Up to cT4c, Any N, M0, Neoadjuvant Therapy followed by Surgery, Invasive Disease, Chemotherapy, HER2 Negative, ER Positive Therapeutic Status: Preoperative or Nonsurgical Candidate, M0 (Clinical Staging) AJCC M Category: cM0 AJCC Grade: G1 ER Status: Positive (+) AJCC 8 Stage Grouping: IIA HER2 Status: Negative (-) AJCC T Category: cT2 AJCC N Category: cN1 PR Status: Positive (+) Breast Surgical Plan: Neoadjuvant Therapy followed by Surgery Intent of Therapy: Curative Intent, Discussed with Patient

## 2024-03-30 NOTE — Progress Notes (Signed)
 [facility] Clinical Social Work  Initial Assessment   Wanda Garcia is a 48 y.o. year old female accompanied by husband. Arabic interpreter also present- Wanda Garcia (pt does understand and speak some [place] and husband speaks [place]). Clinical Social Work was referred by [facility] for assessment of psychosocial needs.   SDOH (Social Determinants of Health) assessments performed: Yes SDOH Interventions    Flowsheet Row Clinical Support from 03/30/2024 in [facility] - A Dept Of [place]. [facility]  SDOH Interventions   Food Insecurity Interventions Intervention Not Indicated  Housing Interventions Intervention Not Indicated  Transportation Interventions Payor Benefit  Utilities Interventions Intervention Not Indicated       SDOH Screenings   Food Insecurity: No Food Insecurity (03/30/2024)  Housing: Low Risk  (03/30/2024)  Transportation Needs: Unmet Transportation Needs (03/30/2024)  Utilities: Not At Risk (03/30/2024)  Depression (PHQ2-9): Low Risk  (03/30/2024)  Tobacco Use: High Risk (03/30/2024)     Distress Screen completed: No     No data to display            Family/Social Information:  Housing Arrangement: patient lives with husband and 7yo son. Son has level 3 Autism (receives ABA and speech therapy) Family members/support persons in your life? Family- some family and friends locally although most work Transportation concerns: yes, for medical appointments if husband is unable to drive her. 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  Income source: husband's employment Financial concerns: Potentially if there is loss of income Type of concern: None Food access concerns: no Religious or spiritual practice: Not known Advanced directives: No Services Currently in place:  [employer] [place] with transportation benefit  Coping/ Adjustment to diagnosis: Patient  understands treatment plan and what happens next? She is still processing information from today and is overwhelmed and scared. Concerned about logistics with transportation as well as care for her son Concerns about diagnosis and/or treatment: How I will care for other members of my family Patient reported stressors: Transportation, Children, and Adjusting to my illness Hopes and/or priorities: ensure child is cared for Patient enjoys time with her son Current coping skills/ strengths: Motivation for treatment/growth     SUMMARY: Current SDOH Barriers:  Limited social support, Transportation, and limited childcare support  Clinical Social Work Clinical Goal(s):  Patient will follow up with medicaid transportation* as directed by SW  Interventions: Discussed common feeling and emotions when being diagnosed with cancer, and the importance of support during treatment Informed patient of the support team roles and support services at [facility] Provided CSW contact information and encouraged patient to call with any questions or concerns Provided patient with information about transportation through Medicaid   Follow Up Plan: Patient will contact CSW with any support or resource needs and CSW will see patient on 1st infusion treatment Patient verbalizes understanding of plan: Yes    [name], LCSW Clinical Social Worker [facility]  Patient is participating in a Managed Medicaid Plan:  Yes

## 2024-03-30 NOTE — Progress Notes (Signed)
 [facility] CONSULT NOTE  Patient Care Team: Collins Dean, NP as PCP - General (Nurse Practitioner) Auther Bo, RN as Oncology Nurse Navigator Alane Hsu, RN as Oncology Nurse Navigator Cameron Cea, MD as Consulting Physician (Hematology and Oncology) Lockie Rima, MD as Consulting Physician (General Surgery) Colie Dawes, MD as Attending Physician (Radiation Oncology)  CHIEF COMPLAINTS/PURPOSE OF CONSULTATION:  Newly diagnosed breast cancer  HISTORY OF PRESENTING ILLNESS:   Ms. Wanda Garcia presented with a screening mammogram detected 2 masses in the left breast upper outer quadrant anterior measure 1.1 cm and posterior 1.3 cm.  Contrast-enhanced mammogram showed additional small masses and the primary masses were 2.4 cm and 1.1 cm the entire span measured 7 x 3 cm ultrasound revealed the mass to be 1.6 cm and 0.8 cm.  2 axillary lymph nodes detected and biopsy-proven positive.  Breast mass biopsy revealed grade 1 IDC ER 95% PR 100% Ki67 15%, HER2 negative.  She was presented this morning at the multidisciplinary tumor board and she is here today accompanied by her husband to discuss treatment plan.  Patient extremely distraught and emotional.  Her husband is helping her.   I reviewed her records extensively and collaborated the history with the patient.  SUMMARY OF ONCOLOGIC HISTORY: Oncology History  Malignant neoplasm of upper-outer quadrant of left breast in female, estrogen receptor positive (HCC)  03/22/2024 Initial Diagnosis   Screening mammogram detected 2 masses in the left breast UOQ.  Mammogram measured 1.3 cm and 1.1 cm.  Contrast-enhanced mammogram was performed which revealed additional smaller masses (2.4 cm and 1.1 cm) spanning 7 x 3 cm.  Ultrasound measured the mass at 1.6 cm and 0.8 cm along with 2 enlarged axillary lymph nodes 1 of which was biopsy positive.  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   03/30/2024 Cancer  Staging   Staging form: Breast, AJCC 8th Edition - Clinical: Stage IIA (cT2, cN1, cM0, G1, ER+, PR+, HER2-) - Signed by Cameron Cea, MD on 03/30/2024 Stage prefix: Initial diagnosis Histologic grading system: 3 grade system      MEDICAL HISTORY:  Past Medical History:  Diagnosis Date   Medical history non-contributory     SURGICAL HISTORY: Past Surgical History:  Procedure Laterality Date   CESAREAN SECTION N/A 04/06/2017   Procedure: CESAREAN SECTION;  Surgeon: Julianne Octave, MD;  [facility];  Service: Obstetrics;  Laterality: N/A;   CHOLECYSTECTOMY N/A 04/26/2019   Procedure: LAPAROSCOPIC CHOLECYSTECTOMY WITH POSSIBLE INTRAOPERATIVE CHOLANGIOGRAM;  Surgeon: Oza Blumenthal, MD;  Location: [facility] OR;  Service: General;  Laterality: N/A;   NO PAST SURGERIES      SOCIAL HISTORY: Social History   Socioeconomic History   Marital status: Married    Spouse name: Not on file   Number of children: Not on file   Years of education: Not on file   Highest education level: Not on file  Occupational History   Not on file  Tobacco Use   Smoking status: Every Day    Types: Cigarettes   Smokeless tobacco: Never   Tobacco comments:    smokes 0-2 / day  Vaping Use   Vaping status: Never Used  Substance and Sexual Activity   Alcohol use: No   Drug use: No   Sexual activity: Yes    Birth control/protection: None  Other Topics Concern   Not on file  Social History Narrative   Not on file   Social Drivers  of Health   Financial Resource Strain: Not on file  Food Insecurity: Not on file  Transportation Needs: Not on file  Physical Activity: Not on file  Stress: Not on file  Social Connections: Not on file  Intimate Partner Violence: Not on file    FAMILY HISTORY: Family History  Problem Relation Age of Onset   Diabetes Father    Hypertension Father    Breast cancer Neg Hx     ALLERGIES:  has no known allergies.  MEDICATIONS:  Current Outpatient  Medications  Medication Sig Dispense Refill   acetaminophen  (TYLENOL ) 325 MG tablet Take 650 mg by mouth every 6 (six) hours as needed for mild pain, moderate pain or headache.      amoxicillin  (AMOXIL ) 500 MG capsule Take 1 capsule (500 mg total) by mouth 2 (two) times daily. 20 capsule 0   cephALEXin  (KEFLEX ) 500 MG capsule Take 1 capsule (500 mg total) by mouth 4 (four) times daily. 20 capsule 0   cetirizine  (ZYRTEC  ALLERGY) 10 MG tablet Take 1 tablet (10 mg total) by mouth daily. 30 tablet 0   ibuprofen  (ADVIL ) 600 MG tablet Take 1 tablet (600 mg total) by mouth every 6 (six) hours as needed. 30 tablet 0   oxyCODONE  (OXY IR/ROXICODONE ) 5 MG immediate release tablet Take 1 tablet (5 mg total) by mouth every 6 (six) hours as needed for moderate pain or severe pain. 25 tablet 0   promethazine -dextromethorphan (PROMETHAZINE -DM) 6.25-15 MG/5ML syrup Take 5 mLs by mouth 3 (three) times daily as needed for cough. 200 mL 0   pseudoephedrine  (SUDAFED) 60 MG tablet Take 1 tablet (60 mg total) by mouth every 8 (eight) hours as needed for congestion. 30 tablet 0   traMADol (ULTRAM) 50 MG tablet Take 50 mg by mouth every 6 (six) hours as needed for moderate pain.     No current facility-administered medications for this visit.    REVIEW OF SYSTEMS:   Constitutional: Denies fevers, chills or abnormal night sweats Breast:  Denies any palpable lumps or discharge All other systems were reviewed with the patient and are negative.  PHYSICAL EXAMINATION: ECOG PERFORMANCE STATUS: 0 - Asymptomatic  Vitals:   03/30/24 0901  BP: (!) 140/92  Pulse: 97  Resp: 18  Temp: 98.4 F (36.9 C)  SpO2: 100%   Filed Weights   03/30/24 0901  Weight: 162 lb 9.6 oz (73.8 kg)    GENERAL:alert, no distress and comfortable    LABORATORY DATA:  I have reviewed the data as listed Lab Results  Component Value Date   WBC 6.8 04/25/2019   HGB 13.6 04/25/2019   HCT 44.0 04/25/2019   MCV 80.1 04/25/2019   PLT 369  04/25/2019   Lab Results  Component Value Date   NA 137 03/23/2019   K 5.2 (H) 03/23/2019   CL 108 03/23/2019   CO2 22 03/23/2019    RADIOGRAPHIC STUDIES: I have personally reviewed the radiological reports and agreed with the findings in the report.  ASSESSMENT AND PLAN:  Malignant neoplasm of upper-outer quadrant of left breast in female, estrogen receptor positive (HCC) 03/22/2024:Screening mammogram detected 2 masses in the left breast UOQ.  Mammogram measured 1.3 cm and 1.1 cm.  Contrast-enhanced mammogram was performed which revealed additional smaller masses (2.4 cm and 1.1 cm) spanning 7 x 3 cm.  Ultrasound measured the mass at 1.6 cm and 0.8 cm along with 2 enlarged axillary lymph nodes 1 of which was biopsy positive.  Breast biopsy grade 1  IDC ER 95% PR 100% Ki67 15% HER2 negative  Pathology and radiology counseling: Discussed with the patient, the details of pathology including the type of breast cancer,the clinical staging, the significance of ER, PR and HER-2/neu receptors and the implications for treatment. After reviewing the pathology in detail, we proceeded to discuss the different treatment options between surgery, radiation, chemotherapy, antiestrogen therapies.  Treatment plan Neoadjuvant chemotherapy with dose dense Adriamycin and Cytoxan every 2 weeks x 4 followed by Taxol weekly x 12 Breast conserving surgery with sentinel lymph node biopsy and targeted node dissection Adjuvant radiation therapy Adjuvant antiestrogen therapy  Chemotherapy Counseling: I discussed the risks and benefits of chemotherapy including the risks of nausea/ vomiting, risk of infection from low WBC count, fatigue due to chemo or anemia, bruising or bleeding due to low platelets, mouth sores, loss/ change in taste and decreased appetite. Liver and kidney function will be monitored through out chemotherapy as abnormalities in liver and kidney function may be a side effect of treatment. Cardiac  dysfunction due to Adriamycin and neuropathy risk from Taxol were discussed in detail. Risk of permanent bone marrow dysfunction and leukemia due to chemo were also discussed.  Return to clinic to start chemotherapy    All questions were answered. The patient knows to call the clinic with any problems, questions or concerns.    Viinay K Hattye Siegfried, MD 03/30/24

## 2024-03-30 NOTE — Progress Notes (Addendum)
 REFERRING PROVIDER: Cameron Cea, MD  PRIMARY PROVIDER:  Collins Dean, NP  PRIMARY REASON FOR VISIT:  Encounter Diagnosis  Name Primary?   Malignant neoplasm of upper-outer quadrant of left breast in female, estrogen receptor positive (HCC) Yes   HISTORY OF PRESENT ILLNESS:   Wanda Garcia, a 48 y.o. female, was seen for a [place] cancer genetics consultation at the request of Dr. Lee Public due to a personal history of cancer.  Wanda Garcia presents to clinic today to discuss the possibility of a hereditary predisposition to cancer, to discuss genetic testing, and to further clarify her future cancer risks, as well as potential cancer risks for family members.   In May 2025, at the age of 2, Wanda Garcia was diagnosed with invasive ductal carcinoma of the left breast (ER/PR positive, HER2 negative).   CANCER HISTORY:  Oncology History  Malignant neoplasm of upper-outer quadrant of left breast in female, estrogen receptor positive (HCC)  03/22/2024 Initial Diagnosis   Screening mammogram detected 2 masses in the left breast UOQ.  Mammogram measured 1.3 cm and 1.1 cm.  Contrast-enhanced mammogram was performed which revealed additional smaller masses (2.4 cm and 1.1 cm) spanning 7 x 3 cm.  Ultrasound measured the mass at 1.6 cm and 0.8 cm along with 2 enlarged axillary lymph nodes 1 of which was biopsy positive.  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   03/30/2024 Cancer Staging   Staging form: Breast, AJCC 8th Edition - Clinical: Stage IIA (cT2, cN1, cM0, G1, ER+, PR+, HER2-) - Signed by Cameron Cea, MD on 03/30/2024 Stage prefix: Initial diagnosis Histologic grading system: 3 grade system     Past Medical History:  Diagnosis Date   Medical history non-contributory     Past Surgical History:  Procedure Laterality Date   CESAREAN SECTION N/A 04/06/2017   Procedure: CESAREAN SECTION;  Surgeon: Julianne Octave, MD;  [facility];   Service: Obstetrics;  Laterality: N/A;   CHOLECYSTECTOMY N/A 04/26/2019   Procedure: LAPAROSCOPIC CHOLECYSTECTOMY WITH POSSIBLE INTRAOPERATIVE CHOLANGIOGRAM;  Surgeon: Oza Blumenthal, MD;  Location: [facility];  Service: General;  Laterality: N/A;   NO PAST SURGERIES      Social History   Socioeconomic History   Marital status: Married    Spouse name: Not on file   Number of children: Not on file   Years of education: Not on file   Highest education level: Not on file  Occupational History   Not on file  Tobacco Use   Smoking status: Every Day    Types: Cigarettes   Smokeless tobacco: Never   Tobacco comments:    smokes 0-2 / day  Vaping Use   Vaping status: Never Used  Substance and Sexual Activity   Alcohol use: No   Drug use: No   Sexual activity: Yes    Birth control/protection: None  Other Topics Concern   Not on file  Social History Narrative   Not on file   Social Drivers of Health   Financial Resource Strain: Not on file  Food Insecurity: Not on file  Transportation Needs: Not on file  Physical Activity: Not on file  Stress: Not on file  Social Connections: Not on file     FAMILY HISTORY:  We obtained a detailed, 4-generation family history.  Significant diagnoses are listed below:  Wanda Garcia is unaware of previous family history of cancer or genetic testing for hereditary cancer risks. There is no reported  Ashkenazi Jewish ancestry.     GENETIC COUNSELING ASSESSMENT: Wanda Garcia is a 48 y.o. female with a personal history of cancer which is somewhat suggestive of a hereditary predisposition to cancer given her young age at diagnosis. We, therefore, discussed and recommended the following at today's visit.   DISCUSSION: We discussed that 5 - 10% of cancer is hereditary, with most cases of breast cancer associated with BRCA1/2.  There are other genes that can be associated with hereditary breast cancer syndromes.  We discussed that testing is beneficial  for several reasons including knowing how to follow individuals after completing their treatment, identifying whether potential treatment options would be beneficial, and understanding if other family members could be at risk for cancer and allowing them to undergo genetic testing.   We did not discuss genetic testing in detail, because Wanda Garcia was overwhelmed and expressed disinterest in genetic testing immediately after discussing the purpose of testing. Wanda Garcia stated that genetic testing results would not impact her surgical decisions at this time. Wanda Garcia and her husband plan to discuss genetic testing in more detail with one another later and stated she may chose to pursue it in the future. I emphasized that STAT testing may take 2 weeks. Therefore, if she changes her mind and believes testing may impact surgical decisions, we would need to collect a sample at least 2 weeks before surgery.   PLAN: Despite our recommendation, Wanda Garcia did not wish to pursue genetic testing at today's visit. We understand this decision and remain available to coordinate genetic testing at any time in the future. We, therefore, recommend Wanda Garcia continue to follow the cancer screening guidelines given by her primary healthcare provider.  Wanda Garcia questions were answered to her satisfaction today. Our contact information was provided should additional questions or concerns arise. Thank you for the referral and allowing us  to share in the care of your patient.   [name], MS, [facility] Genetic Counselor [name] (P) [phone]  30 minutes were spent on the date of the encounter in service to the patient including preparation, face-to-face consultation, documentation and care coordination.  The patient brought her husband. Drs. Gudena and/or Maryalice Smaller were available to discuss this case as needed.    _______________________________________________________________________ For Office Staff:  Number of people involved in session: 3 including the interpreter Was an Intern/ student involved with case: no

## 2024-03-30 NOTE — Assessment & Plan Note (Addendum)
 03/22/2024:Screening mammogram detected 2 masses in the left breast UOQ.  Mammogram measured 1.3 cm and 1.1 cm.  Contrast-enhanced mammogram was performed which revealed additional smaller masses (2.4 cm and 1.1 cm) spanning 7 x 3 cm.  Ultrasound measured the mass at 1.6 cm and 0.8 cm along with 2 enlarged axillary lymph nodes 1 of which was biopsy positive.  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  Pathology and radiology counseling: Discussed with the patient, the details of pathology including the type of breast cancer,the clinical staging, the significance of ER, PR and HER-2/neu receptors and the implications for treatment. After reviewing the pathology in detail, we proceeded to discuss the different treatment options between surgery, radiation, chemotherapy, antiestrogen therapies.  Treatment plan Neoadjuvant chemotherapy with dose dense Adriamycin and Cytoxan every 2 weeks x 4 followed by Taxol weekly x 12 Breast conserving surgery with sentinel lymph node biopsy and targeted node dissection Adjuvant radiation therapy Adjuvant antiestrogen therapy  Chemotherapy Counseling: I discussed the risks and benefits of chemotherapy including the risks of nausea/ vomiting, risk of infection from low WBC count, fatigue due to chemo or anemia, bruising or bleeding due to low platelets, mouth sores, loss/ change in taste and decreased appetite. Liver and kidney function will be monitored through out chemotherapy as abnormalities in liver and kidney function may be a side effect of treatment. Cardiac dysfunction due to Adriamycin and neuropathy risk from Taxol were discussed in detail. Risk of permanent bone marrow dysfunction and leukemia due to chemo were also discussed.  Return to clinic to start chemotherapy

## 2024-03-31 ENCOUNTER — Telehealth: Payer: Self-pay | Admitting: *Deleted

## 2024-03-31 ENCOUNTER — Other Ambulatory Visit: Payer: Self-pay

## 2024-03-31 NOTE — Telephone Encounter (Signed)
 Spoke to pt concerning [facility] from 03/31/24. Denies questions or concerns regarding dx or treatment care plan. Encourage pt to call with needs. Received verbal understanding.  Informed will receive a call with an appt for port placement.

## 2024-04-01 ENCOUNTER — Other Ambulatory Visit: Payer: Self-pay

## 2024-04-01 ENCOUNTER — Other Ambulatory Visit: Payer: Self-pay | Admitting: Hematology and Oncology

## 2024-04-01 ENCOUNTER — Telehealth: Payer: Self-pay | Admitting: Hematology and Oncology

## 2024-04-01 ENCOUNTER — Encounter (HOSPITAL_COMMUNITY): Payer: Self-pay | Admitting: General Surgery

## 2024-04-01 DIAGNOSIS — C50412 Malignant neoplasm of upper-outer quadrant of left female breast: Secondary | ICD-10-CM

## 2024-04-01 NOTE — Progress Notes (Signed)
 PCP - Billie Budge MD Hematology and Oncology - Cameron Cea, MD   ECHO - 03/30/24  Anesthesia review: Y  Patient verbally denies any shortness of breath, fever, cough and chest pain during phone call   -------------  SDW INSTRUCTIONS given:  Your procedure is scheduled on Monday, May 19th.  Report to [facility] Main Entrance "A" at 0730 A.M., and check in at the Admitting office.  Call this number if you have problems the morning of surgery:  [phone]   Remember:  Do not eat after midnight the night before your surgery  You may drink clear liquids until 0700 the morning of your surgery.   Clear liquids allowed are: Water, Non-Citrus Juices (without pulp), Carbonated Beverages, Clear Tea, Black Coffee Only, and Gatorade    Take these medicines the morning of surgery with A SIP OF WATER  N/A  As of today, STOP taking any Aspirin (unless otherwise instructed by your surgeon) Aleve, Naproxen, Ibuprofen , Motrin , Advil , Goody's, BC's, all herbal medications, fish oil, and all vitamins.                      Do not wear jewelry, make up, or nail polish            Do not wear lotions, powders, perfumes/colognes, or deodorant.            Do not shave 48 hours prior to surgery.  Men may shave face and neck.            Do not bring valuables to the hospital.            [facility] is not responsible for any belongings or valuables.  Do NOT Smoke (Tobacco/Vaping) 24 hours prior to your procedure If you use a CPAP at night, you may bring all equipment for your overnight stay.   Contacts, glasses, dentures or bridgework may not be worn into surgery.      For patients admitted to the hospital, discharge time will be determined by your treatment team.   Patients discharged the day of surgery will not be allowed to drive home, and someone needs to stay with them for 24 hours.    Special instructions:   [place]- Preparing For Surgery  Before surgery, you can play an important  role. Because skin is not sterile, your skin needs to be as free of germs as possible. You can reduce the number of germs on your skin by washing with CHG (chlorahexidine gluconate) Soap before surgery.  CHG is an antiseptic cleaner which kills germs and bonds with the skin to continue killing germs even after washing.    Oral Hygiene is also important to reduce your risk of infection.  Remember - BRUSH YOUR TEETH THE MORNING OF SURGERY WITH YOUR REGULAR TOOTHPASTE  Please do not use if you have an allergy to CHG or antibacterial soaps. If your skin becomes reddened/irritated stop using the CHG.  Do not shave (including legs and underarms) for at least 48 hours prior to first CHG shower. It is OK to shave your face.  Please follow these instructions carefully.   Shower the NIGHT BEFORE SURGERY and the MORNING OF SURGERY with DIAL Soap.   Pat yourself dry with a CLEAN TOWEL.  Wear CLEAN PAJAMAS to bed the night before surgery  Place CLEAN SHEETS on your bed the night of your first shower and DO NOT SLEEP WITH PETS.   Day of Surgery: Please shower morning of  surgery  Wear Clean/Comfortable clothing the morning of surgery Do not apply any deodorants/lotions.   Remember to brush your teeth WITH YOUR REGULAR TOOTHPASTE.   Questions were answered. Patient verbalized understanding of instructions.

## 2024-04-01 NOTE — Anesthesia Preprocedure Evaluation (Addendum)
 Anesthesia Evaluation  Patient identified by MRN, date of birth, ID band Patient awake    Reviewed: Allergy & Precautions, H&P , NPO status , Patient's Chart, lab work & pertinent test results  Airway Mallampati: II  TM Distance: >3 FB Neck ROM: Full    Dental no notable dental hx. (+) Teeth Intact, Dental Advisory Given   Pulmonary Current Smoker and Patient abstained from smoking.   Pulmonary exam normal breath sounds clear to auscultation       Cardiovascular negative cardio ROS  Rhythm:Regular Rate:Normal     Neuro/Psych negative neurological ROS  negative psych ROS   GI/Hepatic negative GI ROS, Neg liver ROS,,,  Endo/Other  negative endocrine ROS    Renal/GU negative Renal ROS  negative genitourinary   Musculoskeletal   Abdominal  (+) + obese  Peds  Hematology negative hematology ROS (+)   Anesthesia Other Findings Breast Cancer  Reproductive/Obstetrics negative OB ROS                             Anesthesia Physical Anesthesia Plan  ASA: 3  Anesthesia Plan: General   Post-op Pain Management: Minimal or no pain anticipated   Induction: Intravenous  PONV Risk Score and Plan: 2 and Ondansetron , Midazolam  and Treatment may vary due to age or medical condition  Airway Management Planned: LMA  Additional Equipment:   Intra-op Plan:   Post-operative Plan: Extubation in OR  Informed Consent: I have reviewed the patients History and Physical, chart, labs and discussed the procedure including the risks, benefits and alternatives for the proposed anesthesia with the patient or authorized representative who has indicated his/her understanding and acceptance.     Dental advisory given  Plan Discussed with: CRNA  Anesthesia Plan Comments: (PAT note written 04/01/2024 by Allison Zelenak, PA-C.  )        Anesthesia Quick Evaluation

## 2024-04-01 NOTE — Progress Notes (Signed)
 Anesthesia Chart Review: SAME DAY WORK-UP   Case: [id number] Date/Time: 04/04/24 0945   Procedure: INSERTION, TUNNELED CENTRAL VENOUS DEVICE, WITH PORT   Anesthesia type: General   Diagnosis:      Malignant neoplasm of upper-outer quadrant of left breast in female, estrogen receptor positive (HCC) [C50.412, Z17.0]     Estrogen receptor positive [Z17.0]   Pre-op diagnosis: LEFT BREAST CANCER   [facility] 10 / [facility]   Surgeons: Lockie Rima, MD       DISCUSSION: Patient is a 48 year old female scheduled for the above procedure.  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  She had CMP and CBC on 03/30/2024 through the [facility]. For urine pregnancy test on the day of surgery.  Anesthesia team to evaluate on the day of surgery.    VS:  Wt Readings from Last 3 Encounters:  03/30/24 73.8 kg  11/11/23 63 kg  04/26/19 71.4 kg   BP Readings from Last 3 Encounters:  03/30/24 (!) 140/92  11/11/23 118/85  08/30/23 107/73   Pulse Readings from Last 3 Encounters:  03/30/24 97  11/11/23 97  08/30/23 87     PROVIDERS: Collins Dean, NP is PCP  Cameron Cea, MD is HEM-ONC Colie Dawes, MD is RAD-ONC   LABS: Most recent labs in [facility] include: Lab Results  Component Value Date   WBC 8.5 03/30/2024   HGB 13.8 03/30/2024   HCT 43.2 03/30/2024   PLT 243 03/30/2024   GLUCOSE 99 03/30/2024   CHOL 144 06/25/2018   TRIG 54 06/25/2018   HDL 57 06/25/2018   LDLCALC 76 06/25/2018   ALT 10 03/30/2024   AST 15 03/30/2024   NA 134 (L) 03/30/2024   K 4.5 03/30/2024   CL 106 03/30/2024   CREATININE 0.64 03/30/2024   BUN 10 03/30/2024   CO2 24 03/30/2024   TSH 1.830 11/03/2018    EKG: Last EKG seen 03/23/19: Sinus rhythm Probable anterior infarct, old no prior available for comparison Confirmed by Kelsey Patricia [id number]) on 03/23/2019 4:01:38 PM  CV: N/A  Past Medical History:  Diagnosis Date   Breast  cancer (HCC)    left 03/2024    Past Surgical History:  Procedure Laterality Date   CESAREAN SECTION N/A 04/06/2017   Procedure: CESAREAN SECTION;  Surgeon: Julianne Octave, MD;  Location: [facility];  Service: Obstetrics;  Laterality: N/A;   CHOLECYSTECTOMY N/A 04/26/2019   Procedure: LAPAROSCOPIC CHOLECYSTECTOMY WITH POSSIBLE INTRAOPERATIVE CHOLANGIOGRAM;  Surgeon: Oza Blumenthal, MD;  Location: [facility] OR;  Service: General;  Laterality: N/A;   NO PAST SURGERIES      MEDICATIONS: No current facility-administered medications for this encounter.    Cyanocobalamin (B-12 PO)   Omega-3 Fatty Acids (OMEGA 3 PO)   dexamethasone  (DECADRON ) 4 MG tablet   lidocaine -prilocaine (EMLA) cream   ondansetron  (ZOFRAN ) 8 MG tablet   prochlorperazine (COMPAZINE) 10 MG tablet  She has not yet started as needed Zofran  and Compazine, Emla cream, Decadron  (for post chemo).  Ella Gun, PA-C Surgical Short Stay/Anesthesiology [facility] Phone [phone] [facility] Phone [phone] 04/01/2024 10:41 AM

## 2024-04-01 NOTE — Telephone Encounter (Signed)
Spoke with patient husband confirming upcoming appointment  

## 2024-04-02 ENCOUNTER — Other Ambulatory Visit: Payer: Self-pay

## 2024-04-04 ENCOUNTER — Other Ambulatory Visit: Payer: Self-pay

## 2024-04-04 ENCOUNTER — Ambulatory Visit (HOSPITAL_COMMUNITY)
Admission: RE | Admit: 2024-04-04 | Discharge: 2024-04-04 | Disposition: A | Discharge status: Home / Self Care | Attending: General Surgery | Admitting: General Surgery

## 2024-04-04 ENCOUNTER — Ambulatory Visit (HOSPITAL_COMMUNITY)

## 2024-04-04 ENCOUNTER — Encounter (HOSPITAL_COMMUNITY)
Admission: RE | Disposition: A | Discharge status: Home / Self Care | Payer: Self-pay | Source: Home / Self Care | Attending: General Surgery

## 2024-04-04 ENCOUNTER — Encounter (HOSPITAL_COMMUNITY): Payer: Self-pay | Admitting: General Surgery

## 2024-04-04 ENCOUNTER — Ambulatory Visit (HOSPITAL_BASED_OUTPATIENT_CLINIC_OR_DEPARTMENT_OTHER): Payer: Self-pay | Admitting: Vascular Surgery

## 2024-04-04 ENCOUNTER — Ambulatory Visit (HOSPITAL_COMMUNITY): Payer: Self-pay | Admitting: Vascular Surgery

## 2024-04-04 DIAGNOSIS — Z6831 Body mass index (BMI) 31.0-31.9, adult: Secondary | ICD-10-CM | POA: Diagnosis not present

## 2024-04-04 DIAGNOSIS — Z170 Estrogen receptor positive status [ER+]: Secondary | ICD-10-CM | POA: Diagnosis present

## 2024-04-04 DIAGNOSIS — C50412 Malignant neoplasm of upper-outer quadrant of left female breast: Secondary | ICD-10-CM | POA: Insufficient documentation

## 2024-04-04 DIAGNOSIS — C773 Secondary and unspecified malignant neoplasm of axilla and upper limb lymph nodes: Secondary | ICD-10-CM | POA: Diagnosis not present

## 2024-04-04 DIAGNOSIS — C50912 Malignant neoplasm of unspecified site of left female breast: Secondary | ICD-10-CM

## 2024-04-04 DIAGNOSIS — E669 Obesity, unspecified: Secondary | ICD-10-CM | POA: Insufficient documentation

## 2024-04-04 HISTORY — PX: PORTACATH PLACEMENT: SHX2246

## 2024-04-04 HISTORY — DX: Malignant neoplasm of unspecified site of unspecified female breast: C50.919

## 2024-04-04 LAB — POCT PREGNANCY, URINE: Preg Test, Ur: NEGATIVE

## 2024-04-04 SURGERY — INSERTION, TUNNELED CENTRAL VENOUS DEVICE, WITH PORT
Anesthesia: General | Site: Chest

## 2024-04-04 MED ORDER — 0.9 % SODIUM CHLORIDE (POUR BTL) OPTIME
TOPICAL | Status: DC | PRN
  Administered 2024-04-04: 200 mL

## 2024-04-04 MED ORDER — CHLORHEXIDINE GLUCONATE CLOTH 2 % EX PADS
6.0000 | MEDICATED_PAD | Freq: Once | CUTANEOUS | Status: DC
Start: 2024-04-04 — End: 2024-04-04

## 2024-04-04 MED ORDER — LIDOCAINE 2% (20 MG/ML) 5 ML SYRINGE
INTRAMUSCULAR | Status: DC | PRN
  Administered 2024-04-04: 60 mg via INTRAVENOUS

## 2024-04-04 MED ORDER — ORAL CARE MOUTH RINSE: 15.0000 mL | Freq: Once | OROMUCOSAL | Status: AC

## 2024-04-04 MED ORDER — MIDAZOLAM HCL 2 MG/2ML IJ SOLN
INTRAMUSCULAR | Status: AC
Start: 2024-04-04 — End: ?
  Filled 2024-04-04: qty 2

## 2024-04-04 MED ORDER — EPHEDRINE SULFATE-NACL 50-0.9 MG/10ML-% IV SOSY
PREFILLED_SYRINGE | INTRAVENOUS | Status: DC | PRN
  Administered 2024-04-04: 5 mg via INTRAVENOUS

## 2024-04-04 MED ORDER — MEPERIDINE HCL 25 MG/ML IJ SOLN: 6.2500 mg | INTRAMUSCULAR | Status: DC | PRN

## 2024-04-04 MED ORDER — CEFAZOLIN SODIUM-DEXTROSE 2-4 GM/100ML-% IV SOLN
2.0000 g | INTRAVENOUS | Status: AC
  Administered 2024-04-04: 2 g via INTRAVENOUS
  Filled 2024-04-04: qty 100

## 2024-04-04 MED ORDER — FENTANYL CITRATE (PF) 250 MCG/5ML IJ SOLN
INTRAMUSCULAR | Status: AC
  Filled 2024-04-04: qty 5

## 2024-04-04 MED ORDER — LACTATED RINGERS IV SOLN: INTRAVENOUS | Status: DC

## 2024-04-04 MED ORDER — PROPOFOL 10 MG/ML IV BOLUS
INTRAVENOUS | Status: DC | PRN
  Administered 2024-04-04: 200 mg via INTRAVENOUS

## 2024-04-04 MED ORDER — LIDOCAINE HCL 1 % IJ SOLN
INTRAMUSCULAR | Status: AC
Start: 2024-04-04 — End: ?
  Filled 2024-04-04: qty 20

## 2024-04-04 MED ORDER — OXYCODONE HCL 5 MG/5ML PO SOLN: 5.0000 mg | Freq: Once | ORAL | Status: DC | PRN

## 2024-04-04 MED ORDER — LIDOCAINE HCL 1 % IJ SOLN
INTRAMUSCULAR | Status: DC | PRN
  Administered 2024-04-04: 20 mL via INTRAMUSCULAR

## 2024-04-04 MED ORDER — HYDROMORPHONE HCL 1 MG/ML IJ SOLN
0.2500 mg | INTRAMUSCULAR | Status: DC | PRN
Start: 2024-04-04 — End: 2024-04-04
  Administered 2024-04-04 (×2): 0.5 mg via INTRAVENOUS

## 2024-04-04 MED ORDER — SODIUM CHLORIDE 0.9 % IV SOLN
12.5000 mg | INTRAVENOUS | Status: DC | PRN
  Filled 2024-04-04: qty 0.5

## 2024-04-04 MED ORDER — HEPARIN SOD (PORK) LOCK FLUSH 100 UNIT/ML IV SOLN
INTRAVENOUS | Status: AC
  Filled 2024-04-04: qty 5

## 2024-04-04 MED ORDER — MIDAZOLAM HCL 2 MG/2ML IJ SOLN
INTRAMUSCULAR | Status: DC | PRN
  Administered 2024-04-04: 2 mg via INTRAVENOUS

## 2024-04-04 MED ORDER — HEPARIN 6000 UNIT IRRIGATION SOLUTION
Status: DC | PRN
  Administered 2024-04-04: 1

## 2024-04-04 MED ORDER — ACETAMINOPHEN 500 MG PO TABS
1000.0000 mg | ORAL_TABLET | ORAL | Status: AC
  Administered 2024-04-04: 1000 mg via ORAL
  Filled 2024-04-04: qty 2

## 2024-04-04 MED ORDER — ONDANSETRON HCL 4 MG/2ML IJ SOLN
INTRAMUSCULAR | Status: DC | PRN
  Administered 2024-04-04: 4 mg via INTRAVENOUS

## 2024-04-04 MED ORDER — CHLORHEXIDINE GLUCONATE CLOTH 2 % EX PADS: 6.0000 | MEDICATED_PAD | Freq: Once | CUTANEOUS | Status: DC

## 2024-04-04 MED ORDER — BUPIVACAINE-EPINEPHRINE (PF) 0.25% -1:200000 IJ SOLN
INTRAMUSCULAR | Status: AC
  Filled 2024-04-04: qty 30

## 2024-04-04 MED ORDER — OXYCODONE HCL 5 MG PO TABS: 5.0000 mg | ORAL_TABLET | Freq: Once | ORAL | Status: DC | PRN

## 2024-04-04 MED ORDER — OXYCODONE HCL 5 MG PO TABS: 5.0000 mg | ORAL_TABLET | Freq: Four times a day (QID) | ORAL | 0 refills | Status: DC | PRN

## 2024-04-04 MED ORDER — PROPOFOL 10 MG/ML IV BOLUS
INTRAVENOUS | Status: AC
  Filled 2024-04-04: qty 20

## 2024-04-04 MED ORDER — CHLORHEXIDINE GLUCONATE 0.12 % MT SOLN
15.0000 mL | Freq: Once | OROMUCOSAL | Status: AC
  Administered 2024-04-04: 15 mL via OROMUCOSAL
  Filled 2024-04-04: qty 15

## 2024-04-04 MED ORDER — HEPARIN SOD (PORK) LOCK FLUSH 100 UNIT/ML IV SOLN
INTRAVENOUS | Status: DC | PRN
  Administered 2024-04-04: 500 [IU] via INTRAVENOUS

## 2024-04-04 MED ORDER — HEPARIN 6000 UNIT IRRIGATION SOLUTION
Status: AC
  Filled 2024-04-04: qty 500

## 2024-04-04 MED ORDER — DEXAMETHASONE SODIUM PHOSPHATE 10 MG/ML IJ SOLN: INTRAMUSCULAR | Status: DC | PRN

## 2024-04-04 MED ORDER — AMISULPRIDE (ANTIEMETIC) 5 MG/2ML IV SOLN: 10.0000 mg | Freq: Once | INTRAVENOUS | Status: DC | PRN

## 2024-04-04 MED ORDER — DEXAMETHASONE SODIUM PHOSPHATE 10 MG/ML IJ SOLN
INTRAMUSCULAR | Status: DC | PRN
  Administered 2024-04-04: 10 mg via INTRAVENOUS

## 2024-04-04 MED ORDER — HYDROMORPHONE HCL 1 MG/ML IJ SOLN
INTRAMUSCULAR | Status: AC
  Filled 2024-04-04: qty 1

## 2024-04-04 MED ORDER — FENTANYL CITRATE (PF) 250 MCG/5ML IJ SOLN
INTRAMUSCULAR | Status: DC | PRN
  Administered 2024-04-04 (×2): 50 ug via INTRAVENOUS

## 2024-04-04 SURGICAL SUPPLY — 37 items
BAG COUNTER SPONGE SURGICOUNT (BAG) ×1 IMPLANT
BAG DECANTER FOR FLEXI CONT (MISCELLANEOUS) ×1 IMPLANT
CANISTER SUCTION 3000ML PPV (SUCTIONS) IMPLANT
CHLORAPREP W/TINT 26 (MISCELLANEOUS) ×1 IMPLANT
COVER SURGICAL LIGHT HANDLE (MISCELLANEOUS) ×1 IMPLANT
COVER TRANSDUCER ULTRASND GEL (DISPOSABLE) IMPLANT
DERMABOND ADVANCED .7 DNX12 (GAUZE/BANDAGES/DRESSINGS) ×1 IMPLANT
DRAPE C-ARM 42X120 X-RAY (DRAPES) ×1 IMPLANT
DRAPE CHEST BREAST 15X10 FENES (DRAPES) ×1 IMPLANT
DRAPE WARM FLUID 44X44 (DRAPES) IMPLANT
ELECT COATED BLADE 2.86 ST (ELECTRODE) ×1 IMPLANT
ELECTRODE REM PT RTRN 9FT ADLT (ELECTROSURGICAL) ×1 IMPLANT
GAUZE 4X4 16PLY ~~LOC~~+RFID DBL (SPONGE) ×1 IMPLANT
GEL ULTRASOUND 20GR AQUASONIC (MISCELLANEOUS) IMPLANT
GLOVE BIO SURGEON STRL SZ 6 (GLOVE) ×1 IMPLANT
GLOVE INDICATOR 6.5 STRL GRN (GLOVE) ×1 IMPLANT
GOWN STRL REUS W/ TWL LRG LVL3 (GOWN DISPOSABLE) ×1 IMPLANT
GOWN STRL REUS W/ TWL XL LVL3 (GOWN DISPOSABLE) ×1 IMPLANT
KIT BASIN OR (CUSTOM PROCEDURE TRAY) ×1 IMPLANT
KIT PORT POWER 8FR ISP CVUE (Port) IMPLANT
KIT TURNOVER KIT B (KITS) ×1 IMPLANT
NDL 22X1.5 STRL (OR ONLY) (MISCELLANEOUS) ×1 IMPLANT
NEEDLE 22X1.5 STRL (OR ONLY) (MISCELLANEOUS) ×1 IMPLANT
NS IRRIG 1000ML POUR BTL (IV SOLUTION) ×1 IMPLANT
PAD ARMBOARD POSITIONER FOAM (MISCELLANEOUS) ×1 IMPLANT
PENCIL BUTTON HOLSTER BLD 10FT (ELECTRODE) ×1 IMPLANT
POSITIONER HEAD DONUT 9IN (MISCELLANEOUS) ×1 IMPLANT
SPIKE FLUID TRANSFER (MISCELLANEOUS) ×2 IMPLANT
SUT MON AB 4-0 PC3 18 (SUTURE) ×1 IMPLANT
SUT PROLENE 2 0 SH DA (SUTURE) ×2 IMPLANT
SUT VIC AB 3-0 SH 27X BRD (SUTURE) ×1 IMPLANT
SYR 5ML LUER SLIP (SYRINGE) ×1 IMPLANT
TOWEL GREEN STERILE (TOWEL DISPOSABLE) ×1 IMPLANT
TOWEL GREEN STERILE FF (TOWEL DISPOSABLE) ×1 IMPLANT
TRAY LAPAROSCOPIC MC (CUSTOM PROCEDURE TRAY) ×1 IMPLANT
TUBE CONNECTING 12X1/4 (SUCTIONS) IMPLANT
YANKAUER SUCT BULB TIP NO VENT (SUCTIONS) IMPLANT

## 2024-04-04 NOTE — Progress Notes (Signed)
 Medical Interpreter for Cody Das  [phone]  Pia Brew, RN

## 2024-04-04 NOTE — Interval H&P Note (Signed)
 History and Physical Interval Note:  04/04/2024 9:57 AM  Wanda Garcia  has presented today for surgery, with the diagnosis of LEFT BREAST CANCER.  The various methods of treatment have been discussed with the patient and family. After consideration of risks, benefits and other options for treatment, the patient has consented to  Procedure(s): INSERTION, TUNNELED CENTRAL VENOUS DEVICE, WITH PORT (N/A) as a surgical intervention.  The patient's history has been reviewed, patient examined, no change in status, stable for surgery.  I have reviewed the patient's chart and labs.  Questions were answered to the patient's satisfaction.     Lockie Rima

## 2024-04-04 NOTE — Anesthesia Procedure Notes (Signed)
 Procedure Name: LMA Insertion Date/Time: 04/04/2024 10:19 AM  Performed by: [address] Kermitt Harjo A, CRNAPre-anesthesia Checklist: Patient identified Patient Re-evaluated:Patient Re-evaluated prior to induction Oxygen Delivery Method: Circle system utilized Preoxygenation: Pre-oxygenation with 100% oxygen Induction Type: IV induction Ventilation: Mask ventilation without difficulty LMA: LMA inserted LMA Size: 4.0 Number of attempts: 1 Tube secured with: Tape

## 2024-04-04 NOTE — Transfer of Care (Signed)
 Immediate Anesthesia Transfer of Care Note  Patient: Wanda Garcia  Procedure(s) Performed: INSERTION, TUNNELED CENTRAL VENOUS DEVICE, WITH PORT (Chest)  Patient Location: PACU  Anesthesia Type:General  Level of Consciousness: awake  Airway & Oxygen Therapy: Patient Spontanous Breathing  Post-op Assessment: Report given to RN  Post vital signs: stable  Last Vitals:  Vitals Value Taken Time  BP 141/80 04/04/24 1117  Temp 36.6 C 04/04/24 1117  Pulse 86 04/04/24 1119  Resp 14 04/04/24 1119  SpO2 100 % 04/04/24 1119  Vitals shown include unfiled device data.  Last Pain:  Vitals:   04/04/24 0843  TempSrc:   PainSc: 3          Complications: No notable events documented.

## 2024-04-04 NOTE — Anesthesia Postprocedure Evaluation (Signed)
 Anesthesia Post Note  Patient: Jamiyla Strayer  Procedure(s) Performed: INSERTION, TUNNELED CENTRAL VENOUS DEVICE, WITH PORT (Chest)     Patient location during evaluation: PACU Anesthesia Type: General Level of consciousness: awake and alert Pain management: pain level controlled Vital Signs Assessment: post-procedure vital signs reviewed and stable Respiratory status: spontaneous breathing, nonlabored ventilation and respiratory function stable Cardiovascular status: blood pressure returned to baseline and stable Postop Assessment: no apparent nausea or vomiting Anesthetic complications: no   No notable events documented.  Last Vitals:  Vitals:   04/04/24 1200 04/04/24 1215  BP: 125/77 119/73  Pulse: 63 73  Resp: 14 16  Temp:  36.5 C  SpO2: 99% 99%    Last Pain:  Vitals:   04/04/24 1200  TempSrc:   PainSc: 5                  Earvin Goldberg

## 2024-04-04 NOTE — H&P (Signed)
 REFERRING PHYSICIAN: Marlowe Sinclair  PROVIDER: Eppie Hasting, MD  Care Team: Patient Care Team: Winda Hastings, NP as PCP - General (Family Medicine) Eppie Hasting, MD as Consulting Provider (Surgical Oncology) Lanae Pinal, MD (Radiation Oncology) Gudena, Vinay K, MD (Hematology and Oncology) Orvan Blanch, MD (Radiology)   MRN: [id number] DOB: 03-12-1976 DATE OF ENCOUNTER: 03/30/2024  Subjective   Chief Complaint: Breast Cancer   History of Present Illness: Wanda Garcia is a 48 y.o. female who is seen today as an office consultation at the request of Dr. Gudena for evaluation of Breast Cancer .   Patient presents with a new diagnosis of left breast cancer in May 2025. Patient had a screening detected mass. She underwent baseline screening mammogram and had focal asymmetry on the left. Diagnostic imaging was performed. This was at [facility]. She had a 1.1 cm irregular mass in the upper outer breast at 2:00 6 cm from the nipple. There was also a 1.3 cm irregular mass with obscured margins and architectural distortions of calcifications at 3:00 10 cm from the nipple. Contrast-enhanced mammogram and ultrasound were performed. Ultrasound confirmed these masses and demonstrated that the 3:00 mass was larger at 2.2 cm. It also showed an abnormal lymph node. The contrast-enhanced mammogram showed multiple additional 2 to 5 mm enhancing masses in a segmental distribution in between and anterior to the 2 masses that measured 7.5 cm in greatest dimension but was 3.3 cm from craniocaudal and 3 cm transversely. 7.5 cm was in the anterior to posterior dimension. The 2 lymph nodes demonstrated enhancement. One of the lymph nodes was biopsied and the 2 larger masses were biopsied. The showed grade 1 invasive ductal carcinoma. The lymph node also was positive for carcinoma with no residual lymphoid tissue. Days was ER and PR strongly positive HER2 was negative and Ki-67  was 15%.  Accompanied by husband and arabic interpreter. Has autistic child which complicates treatment.   Family cancer history - none  Diagnostic mammogram/us : [facility] screening mammogram was done on April 22 and described above, diagnostic mammogram was done on April 30 along with the ultrasound, the contrast-enhanced mammogram was done on the day of the biopsies on May 6 was described above  Pathology core needle biopsy: 03/22/24 1. Breast, left, needle core biopsy, (A) 3 o'clock, 4cmfn :  - INVASIVE DUCTAL CARCINOMA, SEE NOTE  - DUCTAL CARCINOMA IN SITU, INTERMEDIATE GRADE  - TUBULE FORMATION: SCORE 2  - NUCLEAR PLEOMORPHISM: SCORE 2  - MITOTIC COUNT: SCORE 1  - TOTAL SCORE: 5  - OVERALL GRADE: 1  - LYMPHOVASCULAR INVASION: NOT IDENTIFIED  - CANCER LENGTH: 0.6 CM  - CALCIFICATIONS: NOT IDENTIFIED  - OTHER FINDINGS: NONE  2. Breast, left, needle core biopsy, (B) 2 o'clock, 5cmfn :  - INVASIVE DUCTAL CARCINOMA, SEE NOTE  - TUBULE FORMATION: SCORE 2  - NUCLEAR PLEOMORPHISM: SCORE 2  - MITOTIC COUNT: SCORE 1  - TOTAL SCORE: 5  - OVERALL GRADE: 1  - LYMPHOVASCULAR INVASION: NOT IDENTIFIED  - CANCER LENGTH: 0.7 CM  - CALCIFICATIONS: NOT IDENTIFIED  - OTHER FINDINGS: NONE  3. Lymph node, needle/core biopsy, (C) left axilla :  - POSITIVE FOR CARCINOMA.SEE NOTE.   Receptors: Estrogen Receptor: 95%, POSITIVE, STRONG STAINING INTENSITY  Progesterone Receptor: 100%, POSITIVE, STRONG STAINING INTENSITY  Proliferation Marker Ki67: 15%  GROUP 5: HER2 **NEGATIVE**   Review of Systems: A complete review of systems was obtained from the patient. I have reviewed this information and discussed  as appropriate with the patient. See HPI as well for other ROS. ROS - otherwise negative.   Medical History: History reviewed. No pertinent past medical history.  Patient Active Problem List  Diagnosis  Malignant neoplasm of upper-outer quadrant of left breast in female, estrogen receptor  positive (CMS/HHS-HCC)  Breast cancer metastasized to axillary lymph node, left (CMS/HHS-HCC)   Past Surgical History:  Procedure Laterality Date  CESAREAN SECTION N/A 04/06/2017  CHOLECYSTECTOMY N/A 04/26/2019    No Known Allergies  Current Outpatient Medications on File Prior to Visit  Medication Sig Dispense Refill  cetirizine  (ZYRTEC ) 10 MG tablet Take 1 tablet by mouth once daily   No current facility-administered medications on file prior to visit.   Family History  Problem Relation Age of Onset  High blood pressure (Hypertension) Father  Diabetes Father    Social History   Tobacco Use  Smoking Status Never  Smokeless Tobacco Never    Social History   Socioeconomic History  Marital status: Married  Tobacco Use  Smoking status: Never  Smokeless tobacco: Never  Substance and Sexual Activity  Alcohol use: Never  Drug use: Never   Objective:   Vitals:  03/30/24 1202  BP: (!) 140/92  Pulse: 97  Resp: 18  Temp: 36.9 C (98.4 F)  Weight: 73.8 kg (162 lb 9.6 oz)  Height: 152.4 cm (5')   Body mass index is 31.76 kg/m.  Gen: No acute distress. Well nourished and well groomed.  Neurological: Alert and oriented to person, place, and time. Coordination normal.  Head: Normocephalic and atraumatic.  Eyes: Conjunctivae are normal. Pupils are equal, round, and reactive to light. No scleral icterus.  Neck: Normal range of motion. Neck supple. No tracheal deviation or thyromegaly present.  Cardiovascular: Normal rate, regular rhythm, normal heart sounds and intact distal pulses. Exam reveals no gallop and no friction rub. No murmur heard. Breast: relatively symmetric. Mild to moderate ptosis. Tender left axilla/lateral left breast. No palpable masses. No nipple retraction or nipple discharge. Right side benign.  Respiratory: Effort normal. No respiratory distress. No chest wall tenderness. Breath sounds normal. No wheezes, rales or rhonchi.  GI: Soft. Bowel sounds  are normal. The abdomen is soft and nontender. There is no rebound and no guarding.  Musculoskeletal: Normal range of motion. Extremities are nontender.  Lymphadenopathy: No cervical, preauricular, postauricular or axillary adenopathy is present Skin: Skin is warm and dry. No rash noted. No diaphoresis. No erythema. No pallor. No clubbing, cyanosis, or edema.  Psychiatric: Normal mood and affect. Behavior is normal. Judgment and thought content normal.   Labs non  Assessment and Plan:   ICD-10-CM  1. Malignant neoplasm of upper-outer quadrant of left breast in female, estrogen receptor positive (CMS/HHS-HCC) C50.412  Z17.0   2. Breast cancer metastasized to axillary lymph node, left (CMS/HHS-HCC) C50.912  C77.3    Patient presents with a new diagnosis of multifocal clinical T2 N1 hormone positive left breast cancer. Given that the patient is premenopausal, we are recommending neoadjuvant chemotherapy. She is also going to receive genetic testing. She may end up downstaging a bit. On her contrast-enhanced mammogram it does appear that there are 2 discrete tumors however there is some increased blood flow in between the 2. There is 7.5 cm of abnormal enhancement total. We will see what happens after chemotherapy as to how this changes. I am not sure that we will be able to do a lumpectomy on this. If so this will need to be bracketed.  Regardless of  the surgical approach, the patient will need adjuvant radiation and antihormonal treatment.  I reviewed port placement with the patient and her husband with the Arabic interpreter. I used diagrams. I reviewed risk of surgery.  We initially discussed neoadjuvant antihormonal treatment allowing for overseas travel followed by neoadjuvant chemo, but the patient is concerned about this and would like to just go straight to neoadjuvant chemotherapy.

## 2024-04-04 NOTE — Op Note (Signed)
 PREOPERATIVE DIAGNOSIS:  left breast cancer     POSTOPERATIVE DIAGNOSIS:  Same     PROCEDURE: left subclavian port placement, Bard ClearVue Power Port, MRI safe, 8-French.      SURGEON:  Lockie Rima, MD      ANESTHESIA:  General   FINDINGS:  Good venous return, easy flush, and tip of the catheter and   SVC 23 cm.      SPECIMEN:  None.      ESTIMATED BLOOD LOSS:  Minimal.      COMPLICATIONS:  None known.      PROCEDURE:  Pt was identified in the holding area and taken to   the operating room, where patient was placed supine on the operating room   table.  General anesthesia was induced.  Patient's arms were tucked and the upper   chest and neck were prepped and draped in sterile fashion.  Time-out was   performed according to the surgical safety check list.  When all was   correct, we continued.   Local anesthetic was administered just under the angle of the left clavicle.  The vein was accessed with 2 pass(es) of the needle. There was good venous return and the wire passed easily with no ectopy.   Fluoroscopy was used to confirm that the wire was in the vena cava.      The patient was placed back level and the area for the pocket was anethetized   with local anesthetic.  A 3-cm transverse incision was made with a #15   blade.  Cautery was used to divide the subcutaneous tissues down to the   pectoralis muscle.  An [employer] retractor was used to elevate the skin   while a pocket was created on top of the pectoralis fascia.  The port   was placed into the pocket to confirm that it was of adequate size.  The   catheter was preattached to the port.  The port was then secured to the   pectoralis fascia with four 2-0 Prolene sutures.  These were clamped and   not tied down yet.    The catheter was tunneled through to the wire exit   site.  The catheter was placed along the wire to determine what length it should be to be in the SVC.  The catheter was cut at 23 cm.  The tunneler  sheath and dilator were passed over the wire and the dilator and wire were removed.  The catheter was advanced through the tunneler sheath and the tunneler sheath was pulled away.  Care was taken to keep the catheter in the tunneler sheath as this occurred. This was advanced and the tunneler sheath was removed.  There was good venous  return and easy flush of the catheter.  The Prolene sutures were tied down to the pectoral fascia.  The skin was reapproximated using 3-0   Vicryl interrupted deep dermal sutures.    Fluoroscopy was used to re-confirm good position of the catheter.  The skin   was then closed using 4-0 Monocryl in a subcuticular fashion.  The port was flushed with concentrated heparin  flush as well.  The wounds were then cleaned, dried, and dressed with Dermabond.  The patient was awakened from anesthesia and taken to the PACU in stable condition.  Needle, sponge, and instrument counts were correct.               Lockie Rima, MD

## 2024-04-04 NOTE — Anesthesia Procedure Notes (Signed)
 Procedure Name: LMA Insertion Date/Time: 04/04/2024 10:19 AM  Performed by: [address] Shermeka Rutt A, CRNAPre-anesthesia Checklist: Patient identified, Emergency Drugs available, Suction available, Patient being monitored and Timeout performed Patient Re-evaluated:Patient Re-evaluated prior to induction Oxygen Delivery Method: Circle system utilized Preoxygenation: Pre-oxygenation with 100% oxygen Induction Type: IV induction Ventilation: Mask ventilation without difficulty LMA Size: 4.0 Tube secured with: Tape

## 2024-04-04 NOTE — Discharge Instructions (Addendum)
 [facility] Office Phone Number [phone]   POST OP INSTRUCTIONS  Always review your discharge instruction sheet given to you by the facility where your surgery was performed.  IF YOU HAVE DISABILITY OR FAMILY LEAVE FORMS, YOU MUST BRING THEM TO THE OFFICE FOR PROCESSING.  DO NOT GIVE THEM TO YOUR DOCTOR.  Take 2 tylenol (acetominophen) three times a day for 3 days.  If you still have pain, add ibuprofen with food in between if able to take this (if you have kidney issues or stomach issues, do not take ibuprofen).  If both of those are not enough, add the narcotic pain pill.  If you find you are needing a lot of this overnight after surgery, call the next morning for a refill.   Take your usually prescribed medications unless otherwise directed If you need a refill on your pain medication, please contact your pharmacy.  They will contact our office to request authorization.  Prescriptions will not be filled after 5pm or on week-ends. You should eat very light the first 24 hours after surgery, such as soup, crackers, pudding, etc.  Resume your normal diet the day after surgery It is common to experience some constipation if taking pain medication after surgery.  Increasing fluid intake and taking a stool softener will usually help or prevent this problem from occurring.  A mild laxative (Milk of Magnesia or Miralax) should be taken according to package directions if there are no bowel movements after 48 hours. You may shower in 48 hours.  The surgical glue will flake off in 2-3 weeks.   ACTIVITIES:  No strenuous activity or heavy lifting for 1 week.   You may drive when you no longer are taking prescription pain medication, you can comfortably wear a seatbelt, and you can safely maneuver your car and apply brakes. RETURN TO WORK:  __________n/a_______________ Wanda Garcia should see your doctor in the office for a follow-up appointment approximately three-four weeks after your surgery.     WHEN TO CALL YOUR DOCTOR: Fever over 101.0 Nausea and/or vomiting. Extreme swelling or bruising. Continued bleeding from incision. Increased pain, redness, or drainage from the incision.  The clinic staff is available to answer your questions during regular business hours.  Please don't hesitate to call and ask to speak to one of the nurses for clinical concerns.  If you have a medical emergency, go to the nearest emergency room or call 911.  A surgeon from [facility] is always on call at the hospital.  For further questions, please visit [email or web address]

## 2024-04-05 ENCOUNTER — Ambulatory Visit (HOSPITAL_COMMUNITY)
Admission: RE | Admit: 2024-04-05 | Discharge: 2024-04-05 | Disposition: A | Discharge status: Home / Self Care | Source: Ambulatory Visit | Attending: Hematology and Oncology | Admitting: Hematology and Oncology

## 2024-04-05 ENCOUNTER — Other Ambulatory Visit: Payer: Self-pay

## 2024-04-05 ENCOUNTER — Inpatient Hospital Stay

## 2024-04-05 ENCOUNTER — Ambulatory Visit (HOSPITAL_COMMUNITY)

## 2024-04-05 ENCOUNTER — Ambulatory Visit (HOSPITAL_BASED_OUTPATIENT_CLINIC_OR_DEPARTMENT_OTHER)
Admission: RE | Admit: 2024-04-05 | Discharge: 2024-04-05 | Disposition: A | Discharge status: Home / Self Care | Source: Ambulatory Visit | Attending: Hematology and Oncology | Admitting: Hematology and Oncology

## 2024-04-05 ENCOUNTER — Encounter (HOSPITAL_COMMUNITY): Payer: Self-pay | Admitting: General Surgery

## 2024-04-05 DIAGNOSIS — C50412 Malignant neoplasm of upper-outer quadrant of left female breast: Secondary | ICD-10-CM | POA: Insufficient documentation

## 2024-04-05 DIAGNOSIS — Z170 Estrogen receptor positive status [ER+]: Secondary | ICD-10-CM | POA: Diagnosis present

## 2024-04-05 DIAGNOSIS — Z0189 Encounter for other specified special examinations: Secondary | ICD-10-CM

## 2024-04-05 LAB — ECHOCARDIOGRAM COMPLETE
AR max vel: 2.23 cm2
AV Area VTI: 2.11 cm2
AV Area mean vel: 2 cm2
AV Mean grad: 3 mmHg
AV Peak grad: 6.4 mmHg
Ao pk vel: 1.26 m/s
Area-P 1/2: 4.49 cm2
S' Lateral: 3.1 cm

## 2024-04-05 MED ORDER — IOHEXOL 300 MG/ML  SOLN
100.0000 mL | Freq: Once | INTRAMUSCULAR | Status: AC | PRN
  Administered 2024-04-05: 100 mL via INTRAVENOUS

## 2024-04-05 MED ORDER — IOHEXOL 300 MG/ML  SOLN: 100.0000 mL | Freq: Once | INTRAMUSCULAR | Status: DC | PRN

## 2024-04-05 NOTE — Progress Notes (Signed)
*  PRELIMINARY RESULTS* Echocardiogram 2D Echocardiogram has been performed.  Wanda Garcia 04/05/2024, 12:05 PM

## 2024-04-05 NOTE — Assessment & Plan Note (Signed)
 03/22/2024:Screening mammogram detected 2 masses in the left breast UOQ.  Mammogram measured 1.3 cm and 1.1 cm.  Contrast-enhanced mammogram was performed which revealed additional smaller masses (2.4 cm and 1.1 cm) spanning 7 x 3 cm.  Ultrasound measured the mass at 1.6 cm and 0.8 cm along with 2 enlarged axillary lymph nodes 1 of which was biopsy positive.  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   Treatment plan Neoadjuvant chemotherapy with dose dense Adriamycin and Cytoxan every 2 weeks x 4 followed by Taxol weekly x 12 Breast conserving surgery with sentinel lymph node biopsy and targeted node dissection Adjuvant radiation therapy Adjuvant antiestrogen therapy ------------------------------------------------------------------------------------------------------- Current Treatment: Cycle 1 dose dense Adriamycin and Cytoxan ECHO EF 60-65% Chemo class completed, consent obtained, labs and anti emetics reviewed RTC in 1 week for tox check

## 2024-04-06 ENCOUNTER — Inpatient Hospital Stay

## 2024-04-06 ENCOUNTER — Other Ambulatory Visit: Payer: Self-pay | Admitting: Hematology and Oncology

## 2024-04-06 ENCOUNTER — Inpatient Hospital Stay (HOSPITAL_BASED_OUTPATIENT_CLINIC_OR_DEPARTMENT_OTHER): Admitting: Hematology and Oncology

## 2024-04-06 ENCOUNTER — Encounter: Payer: Self-pay | Admitting: Hematology and Oncology

## 2024-04-06 ENCOUNTER — Inpatient Hospital Stay: Admitting: Licensed Clinical Social Worker

## 2024-04-06 VITALS — BP 116/60 | HR 68 | Temp 97.9°F | Resp 18

## 2024-04-06 VITALS — BP 136/82 | HR 73 | Temp 98.3°F | Resp 18 | Ht 60.0 in | Wt 163.4 lb

## 2024-04-06 DIAGNOSIS — Z170 Estrogen receptor positive status [ER+]: Secondary | ICD-10-CM

## 2024-04-06 DIAGNOSIS — Z95828 Presence of other vascular implants and grafts: Secondary | ICD-10-CM

## 2024-04-06 DIAGNOSIS — C50412 Malignant neoplasm of upper-outer quadrant of left female breast: Secondary | ICD-10-CM | POA: Diagnosis not present

## 2024-04-06 LAB — CBC WITH DIFFERENTIAL (CANCER CENTER ONLY)
Abs Immature Granulocytes: 0.03 10*3/uL (ref 0.00–0.07)
Basophils Absolute: 0.1 10*3/uL (ref 0.0–0.1)
Basophils Relative: 1 %
Eosinophils Absolute: 0.1 10*3/uL (ref 0.0–0.5)
Eosinophils Relative: 1 %
HCT: 39.8 % (ref 36.0–46.0)
Hemoglobin: 12.9 g/dL (ref 12.0–15.0)
Immature Granulocytes: 0 %
Lymphocytes Relative: 25 %
Lymphs Abs: 2.4 10*3/uL (ref 0.7–4.0)
MCH: 24.7 pg — ABNORMAL LOW (ref 26.0–34.0)
MCHC: 32.4 g/dL (ref 30.0–36.0)
MCV: 76.1 fL — ABNORMAL LOW (ref 80.0–100.0)
Monocytes Absolute: 0.6 10*3/uL (ref 0.1–1.0)
Monocytes Relative: 7 %
Neutro Abs: 6.6 10*3/uL (ref 1.7–7.7)
Neutrophils Relative %: 66 %
Platelet Count: 396 10*3/uL (ref 150–400)
RBC: 5.23 MIL/uL — ABNORMAL HIGH (ref 3.87–5.11)
RDW: 14.1 % (ref 11.5–15.5)
WBC Count: 9.8 10*3/uL (ref 4.0–10.5)
nRBC: 0 % (ref 0.0–0.2)

## 2024-04-06 LAB — CMP (CANCER CENTER ONLY)
ALT: 11 U/L (ref 0–44)
AST: 10 U/L — ABNORMAL LOW (ref 15–41)
Albumin: 4.2 g/dL (ref 3.5–5.0)
Alkaline Phosphatase: 48 U/L (ref 38–126)
Anion gap: 6 (ref 5–15)
BUN: 10 mg/dL (ref 6–20)
CO2: 27 mmol/L (ref 22–32)
Calcium: 8.9 mg/dL (ref 8.9–10.3)
Chloride: 104 mmol/L (ref 98–111)
Creatinine: 0.64 mg/dL (ref 0.44–1.00)
GFR, Estimated: >60 mL/min (ref >60)
Glucose, Bld: 119 mg/dL — ABNORMAL HIGH (ref 70–99)
Potassium: 4.1 mmol/L (ref 3.5–5.1)
Sodium: 137 mmol/L (ref 135–145)
Total Bilirubin: 0.3 mg/dL (ref 0.0–1.2)
Total Protein: 7 g/dL (ref 6.5–8.1)

## 2024-04-06 MED ORDER — SODIUM CHLORIDE 0.9 % IV SOLN
600.0000 mg/m2 | Freq: Once | INTRAVENOUS | Status: AC
  Administered 2024-04-06: 1000 mg via INTRAVENOUS
  Filled 2024-04-06: qty 50

## 2024-04-06 MED ORDER — PALONOSETRON HCL INJECTION 0.25 MG/5ML
0.2500 mg | Freq: Once | INTRAVENOUS | Status: AC
  Administered 2024-04-06: 0.25 mg via INTRAVENOUS
  Filled 2024-04-06: qty 5

## 2024-04-06 MED ORDER — SODIUM CHLORIDE 0.9 % IV SOLN
INTRAVENOUS | Status: DC
Start: 2024-04-06 — End: 2024-04-06

## 2024-04-06 MED ORDER — DEXAMETHASONE 4 MG PO TABS: ORAL_TABLET | ORAL | 0 refills | Status: DC

## 2024-04-06 MED ORDER — SODIUM CHLORIDE 0.9% FLUSH
10.0000 mL | INTRAVENOUS | Status: DC | PRN
  Administered 2024-04-06: 10 mL

## 2024-04-06 MED ORDER — DOXORUBICIN HCL CHEMO IV INJECTION 2 MG/ML
60.0000 mg/m2 | Freq: Once | INTRAVENOUS | Status: AC
  Administered 2024-04-06: 106 mg via INTRAVENOUS
  Filled 2024-04-06: qty 53

## 2024-04-06 MED ORDER — HEPARIN SOD (PORK) LOCK FLUSH 100 UNIT/ML IV SOLN: 500.0000 [IU] | Freq: Once | INTRAVENOUS | Status: DC | PRN

## 2024-04-06 MED ORDER — SODIUM CHLORIDE 0.9% FLUSH
10.0000 mL | Freq: Once | INTRAVENOUS | Status: AC
  Administered 2024-04-06: 10 mL

## 2024-04-06 MED ORDER — SODIUM CHLORIDE 0.9 % IV SOLN
150.0000 mg | Freq: Once | INTRAVENOUS | Status: AC
  Administered 2024-04-06: 150 mg via INTRAVENOUS
  Filled 2024-04-06: qty 150

## 2024-04-06 MED ORDER — DEXAMETHASONE SODIUM PHOSPHATE 10 MG/ML IJ SOLN
10.0000 mg | Freq: Once | INTRAMUSCULAR | Status: AC
  Administered 2024-04-06: 10 mg via INTRAVENOUS
  Filled 2024-04-06: qty 1

## 2024-04-06 NOTE — Patient Instructions (Addendum)
 [facility] - A DEPT OF [facility]  Discharge Instructions: Thank you for choosing [facility] to provide your oncology and hematology care.   If you have a lab appointment with the Cancer Center, please go directly to the Cancer Center and check in at the registration area.   Wear comfortable clothing and clothing appropriate for easy access to any Portacath or PICC line.   We strive to give you quality time with your provider. You may need to reschedule your appointment if you arrive late (15 or more minutes).  Arriving late affects you and other patients whose appointments are after yours.  Also, if you miss three or more appointments without notifying the office, you may be dismissed from the clinic at the provider's discretion.      For prescription refill requests, have your pharmacy contact our office and allow 72 hours for refills to be completed.    Today you received the following chemotherapy and/or immunotherapy agents: Doxorubicin, Cytoxan.       To help prevent nausea and vomiting after your treatment, we encourage you to take your nausea medication as directed.  BELOW ARE SYMPTOMS THAT SHOULD BE REPORTED IMMEDIATELY: *FEVER GREATER THAN 100.4 F (38 C) OR HIGHER *CHILLS OR SWEATING *NAUSEA AND VOMITING THAT IS NOT CONTROLLED WITH YOUR NAUSEA MEDICATION *UNUSUAL SHORTNESS OF BREATH *UNUSUAL BRUISING OR BLEEDING *URINARY PROBLEMS (pain or burning when urinating, or frequent urination) *BOWEL PROBLEMS (unusual diarrhea, constipation, pain near the anus) TENDERNESS IN MOUTH AND THROAT WITH OR WITHOUT PRESENCE OF ULCERS (sore throat, sores in mouth, or a toothache) UNUSUAL RASH, SWELLING OR PAIN  UNUSUAL VAGINAL DISCHARGE OR ITCHING   Items with * indicate a potential emergency and should be followed up as soon as possible or go to the Emergency Department if any problems should occur.  Please show the CHEMOTHERAPY ALERT CARD or  IMMUNOTHERAPY ALERT CARD at check-in to the Emergency Department and triage nurse.  Should you have questions after your visit or need to cancel or reschedule your appointment, please contact [facility] - A DEPT OF [facility]  Dept: [phone]  and follow the prompts.  Office hours are 8:00 a.m. to 4:30 p.m. Monday - Friday. Please note that voicemails left after 4:00 p.m. may not be returned until the following business day.  We are closed weekends and major holidays. You have access to a nurse at all times for urgent questions. Please call the main number to the clinic Dept: [phone] and follow the prompts.   For any non-urgent questions, you may also contact your provider using MyChart. We now offer e-Visits for anyone 2 and older to request care online for non-urgent symptoms. For details visit [email or web address].   Also download the MyChart app! Go to the app store, search "MyChart", open the app, select [place], and log in with your MyChart username and password.  Doxorubicin Liposomal Injection ?? ??? ??????? ????? ???????????? ????????? ??? ????? ???????. ??? ???? ?? ???? ????? ??? ??????? ?????????. ???? ??????? ??? ?????? ?????? ????? ???? ???? ??????? ?????? ?? ??????? ??? ???? ???? ?????. ????? ???????? ???????? ????????:? Doxil, Lipodox ?? ?? ??????? ???? ??? ?? ???? ??? ???? ??????? ?????? ??? ????? ??? ???????  ???????? ??? ????? ?? ??? ??? ?????? ??? ?? ??? ???????:  ??? ???????? ????  ????? ?????  ??????? ????? ?????? ????????? ??? ???? ?????? ???? ?????? ??????  ????? ?????  ??????? ?????? ?? ???????  ?? ??? ??? ???? ?? ?????? ???? ????????????? ?? ??? ??????? ?? ????? ????? ?? ?????? ?? ?????? ?? ???? ?????  ??? ???? ????? ?????? ?? ????? ?????? ?? ??? ??? ????? ????? ????? ?????? ?? ???? ????? ??????? ???????? ??? ??? ???? ??????? ??? ??????? ????? ??? ?????? ?? ??????. ??? ?????? ?????? ???? ?????? ?? ?????? ??  ?????. ???? ??? ???? ?????? ???? ??????? ??? ?????? ???????. ??? ???? ???? ???? ??? ???? ????? ???? ???. ?????? ??????? : ??? ?????? ??? ?????? ???? ????? ???? ?? ??? ??????? ???? ????? ?????? ?? ?????? ?? ???? ??????? ?????.<  br />??????: ?????? ??? ?????? ?? ???? ??? ???. ?? ????? ????? ??? ?? ????? ??? ??????. ???? ?? ???? (????) ????? ???? ??? ?????? ????? ????????. ?? ????? ??? ????? ?????. ???? ????? ?????? ??? ??? ??? ???? ??? ???????? ??? ??? ????????. ?? ?? ??????? ???? ?? ?????? ?? ??? ???????  ?? ?????? ??? ?????? ?? ?? ??? ???: ?????????  ??? ?????? ???? ?? ?????? ????? ?? ?? ???:  ??????? ???? ???? ?? ????? ???? ???? ??????????? ?????????????? ???????????? ???????? ??? ??????? ?? ?? ??? ?? ????????? ????????. ?? ?????? ???? ??????? ?????? ????? ??? ??????? ?? ??????? ?? ??????? ???? ???? ?? ???????? ???????? ???? ????????. ?????? ????? ??? ??? ???? ?? ???? ?????? ?? ?????? ?????? ??? ???????. ?? ?????? ??? ??????? ?? ?????. ?? ???? ??? ???? ????? ??? ??????? ??? ??????? ???? ?????? ????? ?????? ????? ???? ??? ??????. ?? ????? ?????? ???????? ?? ????? ????? ??? ??????. ??? ?????? ?? ????? ???? ???? ????? ????? ???? ???. ??? ??? ??? ????? ??? ????? ??? ?????? ???????? ???? ?? ???? ??? ??????? ??????? ??? ???? ??? ??????? ?????????. ???? ?? ?? ???? ??????. ????? ?? ???? ????? ??? ?? ??? ???? ?????? ?? ?? ????? ???? ?????? ???????. ?? ????? ??? ???? ??? ?????????-?????? ????? ???? ??? ?????. ??? ??? ????. ??? ??? ???? ?????? ?? ?????? ????? ????? ??????. ?? ??? ???????? ?? ??? ?????? ????? ?????? ???????? ?? ???? ?????? ????????. ???? ???? ????????? ?????? ??????????. ???? ??? ???? ?????? ??? ??? ?????? ????????. ?? ???? ?????? ????? ????? ???? ??????? ?????? ????? ?? ??????? ??? ?????? ??? ??????. ??? ?????? ??? ???? ?????? ??? ??? ?? ?????? ?? ????? ??????? ?? ??? ???? ????? ???? ?? ???? ??????. ?? ???? ?????? ????? ????? ??? ??? ???????? ??? ?????? ????? ????? ?? ??? ??? ????? ???? 6  ???? ??? ??? ???? ???. ???? ???????? ????? ??? ????? ????? ????? ??? ?????? ????? 6 ???? ??? ?????? ???????. ???? ????? ?????? ??????? ?? ?????? ??? ?????? ??????? ??. ??? ???? ????? ????? ??? ?????? ?????? ?????? ???? ?????? ???? ?????? ???? ??? ?????? ????? 6 ???? ??? ?????? ???????. ?? ????? ???? ????? ?????? ????? ????? ??? ??????. ?? ???? ??? ?????? ?????. ???? ??? ???? ?????? ??? ??? ????? ???? ??????. ?? ?? ?????? ???????? ???? ???? ?? ??????? ??? ???? ??? ???????  ?????? ???????? ???? ??? ?? ???? ???? ?????? ??? ?? ???? ??? ????:  ??????? ????????--????? ??????? ?????? ??????????? (?????)? ???? ????? ?? ?????? ?? ?????? ?? ?????  ??? ?????--??? ??????? ???? ???????? ?? ??????? ?? ??????? ????? ????? ????????? ????? ?? ????? ??? ???????.  ??????--?????? ?????????? ??????? ?????? ?????? ?????? ???? ?? ?????? ????? ??? ?????? ?? ????? ??????? ?????? ????? ???? ?????? ?? ??????  ??????? ??????? -- ??? ?????? ??? ?????? ?? ????? ??????? ?????? ???????? ?? ??????  ?????? ??? ????? ???? ???????--????? ?? ????? ??? ???????? ??????? ??????? ????? ??????  ????????? ???????? ??????? ?? ????? ??? ?????? ???????? ????? ?? ???? ??? ?????  ?????? ???????? ???? ?? ????? ????? ????? ???? (???? ???? ?????? ??? ?????? ?? ???? ?????):  ?????  ???????  ????? ??????  ???????  ???? ?? ??????? ?? ???? ?? ?????? ???? ???? ?? ?????  ??? ???? ????? ??? ?? ????? ??? ??????? ??? ??????? ?? ?? ??? ?? ?????? ???????? ????????. ???? ?????? ????????? ?????? ?? ?????? ????????. ????? ??????? ?? ?????? ???????? ?????? ??????? ???????? ????????? (FDA) ??? ????? ?.[phone]??? ??? ??? ???? ???????? ??????? ????? ??? ?????? ?? ?????? ?? ?????. ?? ??? ?????? ?? ??????. ??????: ??? ??????? ????? ?? ????: ?? ?? ???? ??? ???????? ?? ????????? ???????. ??? ???? ???? ?? ????? ?? ??? ??????? ????? ??? ?????? ?? ??????? ?? ???? ??????? ??????.  Cyclophosphamide Injection ?? ??? ??????? ????? ????????????? ??? ?????  ???????. ??? ???? ?? ???? ????? ??? ??????? ?????????. ???? ??????? ??? ?????? ?????? ????? ???? ???? ??????? ?????? ?? ??????? ??? ???? ???? ?????. ????? ???????? ???????? ????????:? Cyclophosphamide, Cytoxan, Neosar ?? ?? ??????? ???? ??? ?? ???? ??? ???? ??????? ?????? ??? ????? ??? ???????  ???????? ??? ????? ?? ??? ??? ?????? ??? ?? ??? ???????:  ????? ?????  ??? ?????? ????? ????? ?? ??? ???? ?????  ????  ?????? ?? ?????  ????? ?????  ?????? ??????? ????? ???? (????? ???? ???????? ?? ??????? ???????? ?? ????? ???? ???????)  ????? ?????  ?????? ?????? ?????? ????????  ????? ??????  ?? ??? ??? ???? ?? ?????? ???? ?????????????? ?? ????? ????? ?? ?????? ?? ?????? ?? ???? ?????  ????? ?? ?????? ????? ??????? ???????? ??? ??? ???? ??????? ??? ??????? ????? ??? ?????? ?? ??????. ??? ?????? ?????? ???? ?????? ?? ?????? ?? ?????. ???? ??? ???? ?????? ???? ??????? ??? ?????? ???????. ??? ???? ???? ???? ??? ???? ????? ???? ???. ?????? ??????? : ??? ?????? ??? ?????? ???? ????? ???? ?? ??? ??????? ???? ????? ?????? ?? ?????? ?? ???? ??????? ?????.<br />??????: ?????? ??? ?????? ?? ???? ??? ???. ?? ????? ????? ??? ?? ????? ??? ??????. ???? ?? ???? (????) ????? ???? ??? ?????? ????? ????????. ?? ????? ??? ????? ?????. ???? ????? ?????? ??? ??? ??? ???? ??? ???????? ??? ??? ????????. ?? ?? ??????? ???? ?? ?????? ?? ??? ???????  ??????????? ?  ?????????  ??????????  ??? ?????? ????????? ???? ????? ????? ??? ??????? ??????? ?? ?????? ?????  ????? ????? ??????? ??? ????? ??? ?????????? ??????????? ?????????  ???????????  ????? ?????  ?????????  ???????????  ??????? ???? ???? ???????  ???????????  ???????????  ?????????? ???????? ??? ??????? ?? ?? ??? ?? ????????? ????????. ?? ?????? ???? ??????? ?????? ????? ??? ??????? ?? ??????? ?? ??????? ???? ???? ?? ???????? ???????? ???? ????????. ?????? ????? ??? ??? ???? ?? ???? ?????? ?? ?????? ?????? ??? ???????. ?? ?????? ??? ??????? ??  ?????. ?? ???? ??? ???? ????? ??? ??????? ??? ??????? ??? ?????? ?? ????? ???? ???? ????? ????? ???? ???. ??? ??? ??? ????? ??? ????? ??? ?????? ???????? ???? ?? ???? ??? ??????? ??????? ??? ???? ??? ??????? ?????????. ???? ?? ?? ???? ??????. ????? ?? ???? ????? ??? ?? ??? ???? ?????? ?? ?? ????? ???? ?????? ???????. ?? ????? ??? ????? ???????? ?? ????? ?????? ???? ??????. ??? ?????? ?? ???? ?? ??? ?????? ?????. ????? ???? ?????? ??? ???? ?????? ?? ???????? ?? ?????? ?????? ?? ????? ???? ?? ????? ????? ?? ??????????. ?? ????? ???? ?????. ???? ???? ???????? ???????? ??????. ???? ????? ??????? ???? ????? ??? ????????? ?? ????????????? ?? ??????????? ?? ?????????? ?? ?????????? ?? ?? ????? ???? ?????? ???? ???. ??? ??????? ?? ???? ??????? ???????. ???? ??? ?????? ?? ??????? ???? ??????? ?? ??????? ??? ??????? ???? ?? ????? ????? ?? ???? ?????? ????. ???? ???? ?????? ???? ?????? ??? ?????? ??? ?????? ??? ????? ??? ??????. ???? ????? ?? ??????? ?????? ??? ?????????. ???? ??????? ??? ?? ????? ?????. ??? ???????? ?? ????? ??? ??????. ???? ???? ?????? ???? ??? ??? ??? ?????? ????? ??? ??????. ???? ?? ????? ???? ??? ??????? ??? ?????? ??? ??????. ?? ???? ????? ?????? ??? ??????????? ??????????? ?? ??? ???? ??? ??????? ?? ??????. ?????? ?? ????? ????????? ???????? ????????? ??????? ???????? ?????? ?????. ?? ????? ?? ????? ?? 30 ????? ??? ??? ?????. ????? ??? ???? ?????? ??? ??? ?????? ?? ????? ?? ??????? ??? ????. ?? ???? ??? ?????? ?????? ????? ????? ??? ?? ?????? ????? ????? ?? ??? ??? ????? ???? ??? ???? ??? ??? ????. ??? ?? ???? ????? ?????? ????? ????? ??? ????? ?? ????? ??? ??????. ???? ???????? ????? ?????? ?? ????? ??? ????? ????? ????? ??? ?????? ????? ??? ???? ??? ??? ????. ????? ??? ???? ?????? ??? ????? ??? ????? ????????. ?? ???? ????? ????? ????? ??? ?????? ?????  4 ???? ??? ?????? ???????. ?????? ?????? ???? ??? ?????? ???????. ?? ????? ????? ?????? ????? ??????? ???? ?????? ?? ???? ????? ???? ??? ???  ???? ???. ?? ???? ??? ?????? ?????. ???? ??? ???? ?????? ??? ??? ????? ???? ??????. ???? ??? ???? ?????? ??? ??? ?????? ????????. ?? ???? ???? ???? ??????? ?????? ????? ?? ??????? ??? ??? ?????? ??? ??????. ?? ?? ?????? ???????? ???? ???? ?? ??????? ??? ???? ??? ???????  ?????? ???????? ???? ??? ?? ???? ???? ?????? ??? ?? ???? ??? ????:  ??????? ????????--????? ??????? ?????? ??????????? (?????)? ???? ????? ?? ?????? ?? ?????? ?? ?????  ?????? ?????? ?? ??? ??????? ?? ????? ??????  ??? ?????--??? ??????? ???? ???????? ?? ??????? ?? ??????? ????? ????? ????????? ????? ?? ????? ??? ???????.  ?????? ???? ?????--????? ?? ????? ??? ???????? ??? ??????? ????? ?? ?????? ????? ????? ??????? ?? ??? ????????? ??????? ???? ???????? ?? ??????? ?? ??????.  ???????? ?? ??? ?????--???? ????? ????? ?? ??? ?????? ????? ?????? ??????? ?????? ???????? ?? ??? ?????? ????? ?? ?????? ????? ??????  ??????--?????? ?????????? ??????? ?????? ?????? ?????? ???? ?? ?????? ????? ??? ?????? ?? ????? ??????? ?????? ????? ???? ?????? ?? ??????  ????? ?????--??? ???? ?????? ???? ???????? ?? ?????? ?? ???????  ????? ?????--????? ?? ????? ?????? ?????? ?? ?????? ????? ??????? ???????? ?????? ???? ?????? ????? ?????? ?????? ?? ?????? ?????? ????? ?? ???????? ????? ?? ????? ??? ???????  ?????? ??? ????? ???? ???????--????? ?? ????? ??? ???????? ??????? ??????? ????? ??????  ?????? ??????? ????????--??? ???????? ???????? ??????? ??????? ????????  ????? ?????? ?? ????? ?????? ????? ?? ???? ??? ?????  ?????? ???????? ???? ?? ????? ????? ????? ???? (???? ???? ?????? ??? ?????? ?? ???? ?????):  ????? ?????  ????? ????? ??? ?????? ?? ???? ????? ?? ??? ?????  ????? ??????  ???????  ???? ?? ??????? ?? ???? ?? ?????? ???? ???? ?? ????? ??? ??? ??????? ?? ?? ??? ?? ?????? ???????? ????????. ???? ?????? ????????? ?????? ?? ?????? ????????. ????? ??????? ?? ?????? ???????? ?????? ??????? ???????? ????????? (FDA) ??? ?????  [id number]??? ??? ??? ???? ???????? ??????? ????? ??? ?????? ?? ?????? ?? ?????. ?? ??? ?????? ?? ??????. ??????: ??? ??????? ????? ?? ????: ?? ?? ???? ??? ???????? ?? ????????? ???????. ??? ???? ???? ?? ????? ?? ??? ??????? ????? ??? ?????? ?? ??????? ?? ???? ??????? ??????.   2024 Elsevier/Gold Standard (2022-09-24 00:00:00)

## 2024-04-06 NOTE — Progress Notes (Signed)
 [facility] CSW Progress Note  Visual merchandiser met with patient to check on coping during 1st infusion. Clinical research associate, [name], present. Pt reports that she is doing well today. It is her son's 26th birthday and she is excited to celebrate with him.  Her husband dropped her off today but will use Medicaid transportation in the future.  Discussed that there is an option for counseling in Arabic through [employer] program- pt declined at this time. Feels she has good support from friends.    Marine Lezotte E Fayrene Towner, LCSW Clinical Social Worker  Cancer Center    Patient is participating in a Managed Medicaid Plan:  Yes

## 2024-04-06 NOTE — Progress Notes (Signed)
 Patient Care Team: Center, [facility] as PCP - Arch Ko, Linde Reveal, RN as Oncology Nurse Navigator Alane Hsu, RN as Oncology Nurse Navigator Cameron Cea, MD as Consulting Physician (Hematology and Oncology) Lockie Rima, MD as Consulting Physician (General Surgery) Colie Dawes, MD as Attending Physician (Radiation Oncology)  DIAGNOSIS:  Encounter Diagnosis  Name Primary?   Malignant neoplasm of upper-outer quadrant of left breast in female, estrogen receptor positive (HCC) Yes    SUMMARY OF ONCOLOGIC HISTORY: Oncology History  Malignant neoplasm of upper-outer quadrant of left breast in female, estrogen receptor positive (HCC)  03/22/2024 Initial Diagnosis   Screening mammogram detected 2 masses in the left breast UOQ.  Mammogram measured 1.3 cm and 1.1 cm.  Contrast-enhanced mammogram was performed which revealed additional smaller masses (2.4 cm and 1.1 cm) spanning 7 x 3 cm.  Ultrasound measured the mass at 1.6 cm and 0.8 cm along with 2 enlarged axillary lymph nodes 1 of which was biopsy positive.  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   03/30/2024 Cancer Staging   Staging form: Breast, AJCC 8th Edition - Clinical: Stage IIA (cT2, cN1, cM0, G1, ER+, PR+, HER2-) - Signed by Cameron Cea, MD on 03/30/2024 Stage prefix: Initial diagnosis Histologic grading system: 3 grade system   04/06/2024 -  Chemotherapy   Patient is on Treatment Plan : BREAST DOSE DENSE AC q14d / PACLitaxel q7d       CHIEF COMPLIANT: Cycle 1 dose dense Adriamycin and Cytoxan  HISTORY OF PRESENT ILLNESS: History of Present Illness Wanda Garcia is a 48 year old female with breast cancer who presents for chemotherapy treatment. She is extremely anxious and scared and worried about chemotherapy and its side effects.  She went through the chemo education class and all of her questions have been answered.  She had CT scans which have not been officially read.  To  my review there does not appear to be any distant metastatic disease.       ALLERGIES:  is allergic to pork-derived products.  MEDICATIONS:  Current Outpatient Medications  Medication Sig Dispense Refill   oxyCODONE  (OXY IR/ROXICODONE ) 5 MG immediate release tablet Take 1 tablet (5 mg total) by mouth every 6 (six) hours as needed for severe pain (pain score 7-10). 10 tablet 0   Cyanocobalamin (B-12 PO) Take 1 tablet by mouth once a week. (Patient not taking: Reported on 04/06/2024)     dexamethasone  (DECADRON ) 4 MG tablet Take 2 tablets (8 mg total) by mouth daily for 3 days. Start the day after doxorubicin/cyclophosphamide chemotherapy. Take with food. (Patient not taking: Reported on 04/06/2024) 30 tablet 1   lidocaine -prilocaine  (EMLA ) cream Apply to affected area once (Patient not taking: Reported on 04/06/2024) 30 g 3   Omega-3 Fatty Acids (OMEGA 3 PO) Take 1 tablet by mouth once a week. (Patient not taking: Reported on 04/06/2024)     ondansetron  (ZOFRAN ) 8 MG tablet Take 1 tab (8 mg) by mouth every 8 hrs as needed for nausea/vomiting. Start third day after doxorubicin/cyclophosphamide chemotherapy. (Patient not taking: Reported on 04/06/2024) 30 tablet 1   prochlorperazine  (COMPAZINE ) 10 MG tablet Take 1 tablet (10 mg total) by mouth every 6 (six) hours as needed for nausea or vomiting. (Patient not taking: Reported on 04/06/2024) 30 tablet 1   No current facility-administered medications for this visit.    PHYSICAL EXAMINATION: ECOG PERFORMANCE STATUS: 1 - Symptomatic but completely ambulatory  Vitals:   04/06/24 4098  BP: 136/82  Pulse: 73  Resp: 18  Temp: 98.3 F (36.8 C)  SpO2: 100%   Filed Weights   04/06/24 0853  Weight: 163 lb 6.4 oz (74.1 kg)      LABORATORY DATA:  I have reviewed the data as listed    Latest Ref Rng & Units 04/06/2024    8:05 AM 03/30/2024    1:00 PM 03/23/2019    3:50 PM  CMP  Glucose 70 - 99 mg/dL 604  99  540   BUN 6 - 20 mg/dL 10  10  9     Creatinine 0.44 - 1.00 mg/dL 9.81  1.91  4.78   Sodium 135 - 145 mmol/L 137  134  137   Potassium 3.5 - 5.1 mmol/L 4.1  4.5  5.2   Chloride 98 - 111 mmol/L 104  106  108   CO2 22 - 32 mmol/L 27  24  22    Calcium 8.9 - 10.3 mg/dL 8.9  9.5  9.0   Total Protein 6.5 - 8.1 g/dL 7.0  7.9  6.7   Total Bilirubin 0.0 - 1.2 mg/dL 0.3  0.3  1.0   Alkaline Phos 38 - 126 U/L 48  51  53   AST 15 - 41 U/L 10  15  37   ALT 0 - 44 U/L 11  10  55     Lab Results  Component Value Date   WBC 9.8 04/06/2024   HGB 12.9 04/06/2024   HCT 39.8 04/06/2024   MCV 76.1 (L) 04/06/2024   PLT 396 04/06/2024   NEUTROABS 6.6 04/06/2024    ASSESSMENT & PLAN:  Malignant neoplasm of upper-outer quadrant of left breast in female, estrogen receptor positive (HCC) 03/22/2024:Screening mammogram detected 2 masses in the left breast UOQ.  Mammogram measured 1.3 cm and 1.1 cm.  Contrast-enhanced mammogram was performed which revealed additional smaller masses (2.4 cm and 1.1 cm) spanning 7 x 3 cm.  Ultrasound measured the mass at 1.6 cm and 0.8 cm along with 2 enlarged axillary lymph nodes 1 of which was biopsy positive.  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   Treatment plan Neoadjuvant chemotherapy with dose dense Adriamycin and Cytoxan every 2 weeks x 4 followed by Taxol weekly x 12 Breast conserving surgery with sentinel lymph node biopsy and targeted node dissection Adjuvant radiation therapy Adjuvant antiestrogen therapy ------------------------------------------------------------------------------------------------------- Current Treatment: Cycle 1 dose dense Adriamycin and Cytoxan ECHO EF 60-65% Chemo class completed, consent obtained, labs and anti emetics reviewed CT scans have been performed and results are pending.  To my review there does not appear to be metastatic disease. RTC in 1 week for tox check   ------------------------------------- Assessment and Plan Assessment &  Plan Breast cancer Breast cancer, HER2 negative, undergoing chemotherapy with Doxorubicin, Cyclophosphamide, Pegfilgrastim, and Paclitaxel. Emphasized importance of completing regimen to reduce recurrence risk. HER2 negative status requires different treatment approach than HER2 positive. Current blood work normal, echocardiogram images appear fine, pending official report. - Continue chemotherapy regimen with Doxorubicin, Cyclophosphamide, Pegfilgrastim, and Paclitaxel as scheduled. - Administer bone injection as planned. - Ensure she understands use of antiemetics and has calendar for medication schedule. - Review echocardiogram report once available.  Follow-up Follow-up appointment scheduled to assess treatment progress and discuss any concerns. - Schedule follow-up appointment on Apr 13, 2024, to evaluate treatment progress.      No orders of the defined types were placed in this encounter.  The patient  has a good understanding of the overall plan. she agrees with it. she will call with any problems that may develop before the next visit here. Total time spent: 30 mins including face to face time and time spent for planning, charting and co-ordination of care   Viinay K Joslynn Jamroz, MD 04/06/24

## 2024-04-07 ENCOUNTER — Other Ambulatory Visit: Payer: Self-pay | Admitting: *Deleted

## 2024-04-07 ENCOUNTER — Telehealth: Payer: Self-pay | Admitting: *Deleted

## 2024-04-07 ENCOUNTER — Telehealth: Payer: Self-pay

## 2024-04-07 ENCOUNTER — Encounter: Payer: Self-pay | Admitting: Hematology and Oncology

## 2024-04-07 ENCOUNTER — Ambulatory Visit (HOSPITAL_COMMUNITY)
Admission: RE | Admit: 2024-04-07 | Discharge: 2024-04-07 | Disposition: A | Discharge status: Home / Self Care | Source: Ambulatory Visit | Attending: Hematology and Oncology

## 2024-04-07 DIAGNOSIS — C50412 Malignant neoplasm of upper-outer quadrant of left female breast: Secondary | ICD-10-CM

## 2024-04-07 DIAGNOSIS — Z95828 Presence of other vascular implants and grafts: Secondary | ICD-10-CM

## 2024-04-07 MED ORDER — TECHNETIUM TC 99M MEDRONATE IV KIT
20.0000 | PACK | Freq: Once | INTRAVENOUS | Status: AC | PRN
  Administered 2024-04-07: 19.1 via INTRAVENOUS

## 2024-04-07 NOTE — Telephone Encounter (Signed)
 Received call from pt with complaint of ongoing pain around port a cath area.  Pt states she reached out to surgery and was advised to double up on her Oxy IR for pain relief.  Pt also has complaint of fatigue and shortness of breast post tx this week and requesting to see a provider prior to her injection tomorrow. RN reviewed with MD and verbal orders received to obtain STAT chest xray tomorrow with [facility] visit.  Pt also educated to go to ED if shortness of breath becomes worse, pt verbalized understanding.

## 2024-04-07 NOTE — Telephone Encounter (Signed)
 Husband Wanda Garcia speaks english. He states that Wanda Garcia  is doing pretty well. She has had some nausea and is using the compazine  with good effect.  She is eating, drinking, and urinating well. Husband states that Wanda Garcia has been experiencing some SOB intermittently since last night. She is experiencing pain in chest from [facility] insertion 2 days prior to treatment. Spoke with Dr. Alix Aquas nurse Wanda Garcia to call the husband and discuss this further. He is at work and will call his wife to get more details about this SOB/chest pain. Wanda Garcia to give him 5-10 minutes prior to returning call to give him time to get update from his wife. Husband  knows to call the office at [phone] if he has any questions or concerns  has any questions or concerns.

## 2024-04-07 NOTE — Progress Notes (Signed)
 Received call from patient's spouse after receiving my card per my request.  Introduced myself as Dance movement psychotherapist and to offer available resources. Discussed one-time $1000 [employer] grant to assist with personal expenses while going through treatment. Advised what is needed to apply and how to submit. He will bing on Wed 5/28 and complete process when I return from [facility].  He has my card for any additional financial questions or concerns.

## 2024-04-07 NOTE — Telephone Encounter (Signed)
-----   Message from Nurse Florette Hurry sent at 04/06/2024 11:22 AM EDT ----- Regarding: [name] pt, first time Doxorubicin/Cytoxan Dr Lee Public pt came in 5/21 for first time Doxorubicin/Cytoxan. Tolerated infusions well. Needs call back. Speaks Arabic, needs translation.

## 2024-04-08 ENCOUNTER — Telehealth: Payer: Self-pay | Admitting: *Deleted

## 2024-04-08 ENCOUNTER — Inpatient Hospital Stay (HOSPITAL_BASED_OUTPATIENT_CLINIC_OR_DEPARTMENT_OTHER): Admitting: Adult Health

## 2024-04-08 ENCOUNTER — Encounter: Payer: Self-pay | Admitting: Adult Health

## 2024-04-08 ENCOUNTER — Other Ambulatory Visit: Payer: Self-pay | Admitting: *Deleted

## 2024-04-08 ENCOUNTER — Telehealth: Payer: Self-pay

## 2024-04-08 ENCOUNTER — Ambulatory Visit (HOSPITAL_COMMUNITY)
Admission: RE | Admit: 2024-04-08 | Discharge: 2024-04-08 | Disposition: A | Discharge status: Home / Self Care | Source: Ambulatory Visit | Attending: Hematology and Oncology | Admitting: Hematology and Oncology

## 2024-04-08 ENCOUNTER — Inpatient Hospital Stay

## 2024-04-08 ENCOUNTER — Encounter: Payer: Self-pay | Admitting: *Deleted

## 2024-04-08 VITALS — BP 122/77 | HR 74 | Temp 98.7°F | Resp 18 | Ht 60.0 in | Wt 160.4 lb

## 2024-04-08 DIAGNOSIS — Z95828 Presence of other vascular implants and grafts: Secondary | ICD-10-CM | POA: Insufficient documentation

## 2024-04-08 DIAGNOSIS — C50412 Malignant neoplasm of upper-outer quadrant of left female breast: Secondary | ICD-10-CM

## 2024-04-08 DIAGNOSIS — Z170 Estrogen receptor positive status [ER+]: Secondary | ICD-10-CM | POA: Diagnosis present

## 2024-04-08 DIAGNOSIS — K769 Liver disease, unspecified: Secondary | ICD-10-CM | POA: Diagnosis not present

## 2024-04-08 MED ORDER — PEGFILGRASTIM-CBQV 6 MG/0.6ML ~~LOC~~ SOSY
6.0000 mg | PREFILLED_SYRINGE | Freq: Once | SUBCUTANEOUS | Status: AC
  Administered 2024-04-08: 6 mg via SUBCUTANEOUS
  Filled 2024-04-08: qty 0.6

## 2024-04-08 NOTE — Progress Notes (Unsigned)
 Wanda Garcia:    Center, [facility] [address] [place] [address]   DIAGNOSIS:  Cancer Staging  Malignant neoplasm of upper-outer quadrant of left breast in female, estrogen receptor positive (HCC) Staging form: Breast, AJCC 8th Edition - Clinical: Stage IIA (cT2, cN1, cM0, G1, ER+, PR+, HER2-) - Signed by Cameron Cea, MD on 03/30/2024 Stage prefix: Initial diagnosis Histologic grading system: 3 grade system    SUMMARY OF ONCOLOGIC HISTORY: Oncology History  Malignant neoplasm of upper-outer quadrant of left breast in female, estrogen receptor positive (HCC)  03/22/2024 Initial Diagnosis   Screening mammogram detected 2 masses in the left breast UOQ.  Mammogram measured 1.3 cm and 1.1 cm.  Contrast-enhanced mammogram was performed which revealed additional smaller masses (2.4 cm and 1.1 cm) spanning 7 x 3 cm.  Ultrasound measured the mass at 1.6 cm and 0.8 cm along with 2 enlarged axillary lymph nodes 1 of which was biopsy positive.  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   03/30/2024 Cancer Staging   Staging form: Breast, AJCC 8th Edition - Clinical: Stage IIA (cT2, cN1, cM0, G1, ER+, PR+, HER2-) - Signed by Cameron Cea, MD on 03/30/2024 Stage prefix: Initial diagnosis Histologic grading system: 3 grade system   04/06/2024 -  Chemotherapy   Patient is on Treatment Plan : BREAST DOSE DENSE AC q14d / PACLitaxel q7d       CURRENT THERAPY: Adriamycin/Cytoxan  INTERVAL HISTORY:  Wanda Garcia 48 y.o. female returns for    Patient Active Problem List   Diagnosis Date Noted   Port-A-Cath in place 04/06/2024   Malignant neoplasm of upper-outer quadrant of left breast in female, estrogen receptor positive (HCC) 03/28/2024   Neck pain 07/09/2018   Trigger finger of left thumb 07/09/2018   Pain in left wrist 07/09/2018   Pain in right wrist 07/09/2018   S/P cesarean section 04/05/2017   Advanced  maternal age, primigravida in first trimester, antepartum     is allergic to pork-derived products.  MEDICAL HISTORY: Past Medical History:  Diagnosis Date   Breast cancer (HCC)    left 03/2024    SURGICAL HISTORY: Past Surgical History:  Procedure Laterality Date   BREAST BIOPSY Left 03/2024   CESAREAN SECTION N/A 04/06/2017   Procedure: CESAREAN SECTION;  Surgeon: Julianne Octave, MD;  Location: [facility];  Service: Obstetrics;  Laterality: N/A;   CHOLECYSTECTOMY N/A 04/26/2019   Procedure: LAPAROSCOPIC CHOLECYSTECTOMY WITH POSSIBLE INTRAOPERATIVE CHOLANGIOGRAM;  Surgeon: Oza Blumenthal, MD;  Location: [facility] OR;  Service: General;  Laterality: N/A;   PORTACATH PLACEMENT N/A 04/04/2024   Procedure: INSERTION, TUNNELED CENTRAL VENOUS DEVICE, WITH PORT;  Surgeon: Lockie Rima, MD;  Location: [facility] OR;  Service: General;  Laterality: N/A;    SOCIAL HISTORY: Social History   Socioeconomic History   Marital status: Married    Spouse name: Not on file   Number of children: Not on file   Years of education: Not on file   Highest education level: Not on file  Occupational History   Not on file  Tobacco Use   Smoking status: Every Day    Types: Cigarettes   Smokeless tobacco: Never   Tobacco comments:    smokes 0-2 / day  Vaping Use   Vaping status: Never Used  Substance and Sexual Activity   Alcohol use: No   Drug use: No   Sexual activity: Yes    Birth control/protection: None  Other Topics Concern   Not on file  Social History Narrative   Not on file   Social Drivers of Health   Financial Resource Strain: Not on file  Food Insecurity: No Food Insecurity (03/30/2024)   Hunger Vital Sign    Worried About Running Out of Food in the Last Year: Never true    Ran Out of Food in the Last Year: Never true  Transportation Needs: Unmet Transportation Needs (03/30/2024)   PRAPARE - Administrator, Civil Service (Medical): Yes    Lack of Transportation  (Non-Medical): No  Physical Activity: Not on file  Stress: Not on file  Social Connections: Not on file  Intimate Partner Violence: Patient Unable To Answer (03/30/2024)   Humiliation, Afraid, Rape, and Kick questionnaire    Fear of Current or Ex-Partner: Patient unable to answer    Emotionally Abused: Patient unable to answer    Physically Abused: Patient unable to answer    Sexually Abused: Patient unable to answer    FAMILY HISTORY: Family History  Problem Relation Age of Onset   Diabetes Father    Hypertension Father    Breast cancer Neg Hx     Review of Systems  Constitutional:  Negative for appetite change, chills, fatigue, fever and unexpected weight change.  HENT:   Negative for hearing loss, lump/mass and trouble swallowing.   Eyes:  Negative for eye problems and icterus.  Respiratory:  Positive for shortness of breath. Negative for chest tightness and cough.   Cardiovascular:  Negative for chest pain, leg swelling and palpitations.  Gastrointestinal:  Negative for abdominal distention, abdominal pain, constipation, diarrhea, nausea and vomiting.  Endocrine: Negative for hot flashes.  Genitourinary:  Negative for difficulty urinating.   Musculoskeletal:  Negative for arthralgias.  Skin:  Negative for itching and rash.  Neurological:  Negative for dizziness, extremity weakness, headaches and numbness.  Hematological:  Negative for adenopathy. Does not bruise/bleed easily.  Psychiatric/Behavioral:  Negative for depression. The patient is not nervous/anxious.       PHYSICAL EXAMINATION   Vitals:   04/08/24 1151  BP: 122/77  Pulse: 74  Resp: 18  Temp: 98.7 F (37.1 C)  SpO2: 100%    Physical Exam Constitutional:      General: She is not in acute distress.    Appearance: Normal appearance. She is not toxic-appearing.  HENT:     Head: Normocephalic and atraumatic.     Mouth/Throat:     Mouth: Mucous membranes are moist.     Pharynx: Oropharynx is clear. No  oropharyngeal exudate or posterior oropharyngeal erythema.  Eyes:     General: No scleral icterus. Cardiovascular:     Rate and Rhythm: Normal rate and regular rhythm.     Pulses: Normal pulses.     Heart sounds: Normal heart sounds.  Pulmonary:     Effort: Pulmonary effort is normal.     Breath sounds: Normal breath sounds.  Abdominal:     General: Abdomen is flat. Bowel sounds are normal. There is no distension.     Palpations: Abdomen is soft.     Tenderness: There is no abdominal tenderness.  Musculoskeletal:        General: No swelling.     Cervical back: Neck supple.  Lymphadenopathy:     Cervical: No cervical adenopathy.  Skin:    General: Skin is warm and dry.     Findings: No rash.  Neurological:     General: No focal deficit present.  Mental Status: She is alert.  Psychiatric:        Mood and Affect: Mood normal.        Behavior: Behavior normal.     LABORATORY DATA:  CBC    Component Value Date/Time   WBC 9.8 04/06/2024 0805   WBC 6.8 04/25/2019 1341   RBC 5.23 (H) 04/06/2024 0805   HGB 12.9 04/06/2024 0805   HGB 12.7 06/25/2018 1507   HCT 39.8 04/06/2024 0805   HCT 43.3 06/25/2018 1507   PLT 396 04/06/2024 0805   PLT 369 06/25/2018 1507   MCV 76.1 (L) 04/06/2024 0805   MCV 79 06/25/2018 1507   MCH 24.7 (L) 04/06/2024 0805   MCHC 32.4 04/06/2024 0805   RDW 14.1 04/06/2024 0805   RDW 14.4 06/25/2018 1507   LYMPHSABS 2.4 04/06/2024 0805   MONOABS 0.6 04/06/2024 0805   EOSABS 0.1 04/06/2024 0805   BASOSABS 0.1 04/06/2024 0805    CMP     Component Value Date/Time   NA 137 04/06/2024 0805   NA 142 06/25/2018 1507   K 4.1 04/06/2024 0805   CL 104 04/06/2024 0805   CO2 27 04/06/2024 0805   GLUCOSE 119 (H) 04/06/2024 0805   BUN 10 04/06/2024 0805   BUN 10 06/25/2018 1507   CREATININE 0.64 04/06/2024 0805   CALCIUM 8.9 04/06/2024 0805   PROT 7.0 04/06/2024 0805   PROT 7.0 06/25/2018 1507   ALBUMIN 4.2 04/06/2024 0805   ALBUMIN 4.6  06/25/2018 1507   AST 10 (L) 04/06/2024 0805   ALT 11 04/06/2024 0805   ALKPHOS 48 04/06/2024 0805   BILITOT 0.3 04/06/2024 0805   GFRNONAA >60 04/06/2024 0805   GFRAA >60 03/23/2019 1550     ASSESSMENT and THERAPY PLAN:   No problem-specific Assessment & Plan notes found for this encounter.     All questions were answered. The patient knows to call the clinic with any problems, questions or concerns. We can certainly see the patient much sooner if necessary.  Total encounter time:*** minutes*in face-to-face visit time, chart review, lab review, care coordination, order entry, and documentation of the encounter time.    Alwin Baars, NP 04/08/24 12:55 PM Medical Oncology and Hematology [facility] [address] [place], [place] [address] Tel. [phone]    Fax. [phone]  *Total Encounter Time as defined by the Centers for Medicare and Medicaid Services includes, in addition to the face-to-face time of a patient visit (documented in the note above) non-face-to-face time: obtaining and reviewing outside history, ordering and reviewing medications, tests or procedures, care coordination (communications with other health care professionals or caregivers) and documentation in the medical record.

## 2024-04-08 NOTE — Telephone Encounter (Signed)
 This RN called pt's husband reviewed normal reading of chest xray - verified to proceed with MRI of liver as scheduled.  Patient status has not changed overall from visit - with main issue now of concern that her cancer has progressed to her liver.  This RN validated pt's concerns with no further needs at this time.

## 2024-04-08 NOTE — Patient Instructions (Signed)

## 2024-04-08 NOTE — Telephone Encounter (Signed)
 Per Np Alwin Baars Mri was scheduled of the liver and pt. was notified of stat appt. 5/24 @ 1:30 or 5:30.

## 2024-04-08 NOTE — Telephone Encounter (Signed)
 Last ECHO 12/28/2023 with stable EF of 65-70% - will proceed with treatment as planned. Noted active order in chart - called to [facility] to request appt prior to next infusion.  Obtained VM - detailed VM left to be scheduled.

## 2024-04-09 ENCOUNTER — Ambulatory Visit (HOSPITAL_COMMUNITY)
Admission: RE | Admit: 2024-04-09 | Discharge: 2024-04-09 | Disposition: A | Discharge status: Home / Self Care | Source: Ambulatory Visit | Attending: Adult Health | Admitting: Adult Health

## 2024-04-09 DIAGNOSIS — K769 Liver disease, unspecified: Secondary | ICD-10-CM | POA: Insufficient documentation

## 2024-04-09 DIAGNOSIS — C50412 Malignant neoplasm of upper-outer quadrant of left female breast: Secondary | ICD-10-CM | POA: Diagnosis present

## 2024-04-09 DIAGNOSIS — Z170 Estrogen receptor positive status [ER+]: Secondary | ICD-10-CM | POA: Insufficient documentation

## 2024-04-09 MED ORDER — GADOBUTROL 1 MMOL/ML IV SOLN
7.0000 mL | Freq: Once | INTRAVENOUS | Status: AC | PRN
  Administered 2024-04-09: 7 mL via INTRAVENOUS

## 2024-04-12 ENCOUNTER — Ambulatory Visit: Payer: Self-pay | Admitting: *Deleted

## 2024-04-12 ENCOUNTER — Encounter: Payer: Self-pay | Admitting: Adult Health

## 2024-04-12 ENCOUNTER — Telehealth: Payer: Self-pay | Admitting: *Deleted

## 2024-04-12 ENCOUNTER — Encounter: Payer: Self-pay | Admitting: *Deleted

## 2024-04-12 ENCOUNTER — Other Ambulatory Visit: Payer: Self-pay | Admitting: *Deleted

## 2024-04-12 ENCOUNTER — Encounter: Payer: Self-pay | Admitting: Hematology and Oncology

## 2024-04-12 DIAGNOSIS — Z170 Estrogen receptor positive status [ER+]: Secondary | ICD-10-CM

## 2024-04-12 NOTE — Telephone Encounter (Signed)
 Per NP request RN placed call to pt with good news results showing recent MRI Liver was negative for metastatic disease.  Pt educated and verbalized understanding.

## 2024-04-12 NOTE — Telephone Encounter (Signed)
 Made pt husband aware of message below. Pt husband verbalized understanding.

## 2024-04-12 NOTE — Assessment & Plan Note (Signed)
 03/22/2024:Screening mammogram detected 2 masses in the left breast UOQ.  Mammogram measured 1.3 cm and 1.1 cm.  Contrast-enhanced mammogram was performed which revealed additional smaller masses (2.4 cm and 1.1 cm) spanning 7 x 3 cm.  Ultrasound measured the mass at 1.6 cm and 0.8 cm along with 2 enlarged axillary lymph nodes 1 of which was biopsy positive.  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   Treatment plan Neoadjuvant chemotherapy with dose dense Adriamycin and Cytoxan every 2 weeks x 4 followed by Taxol weekly x 12 Breast conserving surgery with sentinel lymph node biopsy and targeted node dissection Adjuvant radiation therapy Adjuvant antiestrogen therapy ------------------------------------------------------------------------------------------------------- Current Treatment: Cycle 1 dose dense Adriamycin and Cytoxan ECHO EF 60-65%  Port discomfort: stat chest x-ray is pending.  I await those results.  I reached out to Dr. Lydia Sams who is the VP of imaging to report the delay in reading the x-ray.  I recommended that she continue oxycodone  if she needs it for her pain.  He has no sign of infection or blood clot.  I reviewed we would call her with the chest xray results once we are able to review them.  I reviewed her CT chest abdomen pelvis and bone scan results.  During the discussion she became anxious and began crying as she fears that cancer may have spread to her liver.  I placed orders for MRI of the liver with and without contrast.  I was unable to talk to her husband during the visit over the phone due to the fact that she could not get a signal from the exam room.  I spoke to him after and reviewed that we will get the scan scheduled and read as quickly as we possibly could.  RTC next week for labs and f/u.

## 2024-04-12 NOTE — Telephone Encounter (Signed)
-----   Message from Conway Dennis sent at 04/12/2024  9:00 AM EDT ----- MR liver was negative. ----- Message ----- From: Dannis Dy, Rad Results In Sent: 04/11/2024  11:51 AM EDT To: Percival Brace, NP

## 2024-04-13 ENCOUNTER — Inpatient Hospital Stay

## 2024-04-13 ENCOUNTER — Inpatient Hospital Stay (HOSPITAL_BASED_OUTPATIENT_CLINIC_OR_DEPARTMENT_OTHER): Admitting: Hematology and Oncology

## 2024-04-13 VITALS — BP 124/69 | HR 98 | Temp 98.5°F | Resp 16 | Wt 159.8 lb

## 2024-04-13 DIAGNOSIS — Z95828 Presence of other vascular implants and grafts: Secondary | ICD-10-CM

## 2024-04-13 DIAGNOSIS — C50412 Malignant neoplasm of upper-outer quadrant of left female breast: Secondary | ICD-10-CM | POA: Diagnosis not present

## 2024-04-13 DIAGNOSIS — Z170 Estrogen receptor positive status [ER+]: Secondary | ICD-10-CM

## 2024-04-13 LAB — CMP (CANCER CENTER ONLY)
ALT: 26 U/L (ref 0–44)
AST: 12 U/L — ABNORMAL LOW (ref 15–41)
Albumin: 4.3 g/dL (ref 3.5–5.0)
Alkaline Phosphatase: 91 U/L (ref 38–126)
Anion gap: 6 (ref 5–15)
BUN: 8 mg/dL (ref 6–20)
CO2: 26 mmol/L (ref 22–32)
Calcium: 9 mg/dL (ref 8.9–10.3)
Chloride: 105 mmol/L (ref 98–111)
Creatinine: 0.48 mg/dL (ref 0.44–1.00)
GFR, Estimated: >60 mL/min (ref >60)
Glucose, Bld: 155 mg/dL — ABNORMAL HIGH (ref 70–99)
Potassium: 3.5 mmol/L (ref 3.5–5.1)
Sodium: 137 mmol/L (ref 135–145)
Total Bilirubin: 0.3 mg/dL (ref 0.0–1.2)
Total Protein: 7.1 g/dL (ref 6.5–8.1)

## 2024-04-13 LAB — CBC WITH DIFFERENTIAL (CANCER CENTER ONLY)
Abs Immature Granulocytes: 0.1 10*3/uL — ABNORMAL HIGH (ref 0.00–0.07)
Basophils Absolute: 0 10*3/uL (ref 0.0–0.1)
Basophils Relative: 0 %
Eosinophils Absolute: 0.3 10*3/uL (ref 0.0–0.5)
Eosinophils Relative: 5 %
HCT: 38.8 % (ref 36.0–46.0)
Hemoglobin: 12.6 g/dL (ref 12.0–15.0)
Immature Granulocytes: 2 %
Lymphocytes Relative: 20 %
Lymphs Abs: 0.9 10*3/uL (ref 0.7–4.0)
MCH: 24.7 pg — ABNORMAL LOW (ref 26.0–34.0)
MCHC: 32.5 g/dL (ref 30.0–36.0)
MCV: 75.9 fL — ABNORMAL LOW (ref 80.0–100.0)
Monocytes Absolute: 0.3 10*3/uL (ref 0.1–1.0)
Monocytes Relative: 7 %
Neutro Abs: 3 10*3/uL (ref 1.7–7.7)
Neutrophils Relative %: 66 %
Platelet Count: 191 10*3/uL (ref 150–400)
RBC: 5.11 MIL/uL (ref 3.87–5.11)
RDW: 13.8 % (ref 11.5–15.5)
Smear Review: NORMAL
WBC Count: 4.7 10*3/uL (ref 4.0–10.5)
nRBC: 0 % (ref 0.0–0.2)

## 2024-04-13 MED ORDER — SODIUM CHLORIDE 0.9% FLUSH
10.0000 mL | Freq: Once | INTRAVENOUS | Status: AC
  Administered 2024-04-13: 10 mL

## 2024-04-13 NOTE — Progress Notes (Signed)
 Patient Care Team: [facility] as PCP - Arch Ko, Linde Reveal, RN as Oncology Nurse Navigator Alane Hsu, RN as Oncology Nurse Navigator Cameron Cea, MD as Consulting Physician (Hematology and Oncology) Lockie Rima, MD as Consulting Physician (General Surgery) Colie Dawes, MD as Attending Physician (Radiation Oncology)  DIAGNOSIS:  Encounter Diagnosis  Name Primary?   Malignant neoplasm of upper-outer quadrant of left breast in female, estrogen receptor positive (HCC) Yes    SUMMARY OF ONCOLOGIC HISTORY: Oncology History  Malignant neoplasm of upper-outer quadrant of left breast in female, estrogen receptor positive (HCC)  03/22/2024 Initial Diagnosis   Screening mammogram detected 2 masses in the left breast UOQ.  Mammogram measured 1.3 cm and 1.1 cm.  Contrast-enhanced mammogram was performed which revealed additional smaller masses (2.4 cm and 1.1 cm) spanning 7 x 3 cm.  Ultrasound measured the mass at 1.6 cm and 0.8 cm along with 2 enlarged axillary lymph nodes 1 of which was biopsy positive.  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   03/30/2024 Cancer Staging   Staging form: Breast, AJCC 8th Edition - Clinical: Stage IIA (cT2, cN1, cM0, G1, ER+, PR+, HER2-) - Signed by Cameron Cea, MD on 03/30/2024 Stage prefix: Initial diagnosis Histologic grading system: 3 grade system   04/06/2024 -  Chemotherapy   Patient is on Treatment Plan : BREAST DOSE DENSE AC q14d / PACLitaxel q7d       CHIEF COMPLIANT: Cycle 1 day 8 dose dense Adriamycin and Cytoxan  HISTORY OF PRESENT ILLNESS:  History of Present Illness Wanda Garcia is a 48 year old female who presents for follow-up after chemotherapy treatment.  She is one week post-chemotherapy, experiencing significant fatigue and occasional mild nausea, particularly in the mornings, without vomiting. Anti-nausea medication is effective. Dizziness affects her walking between days four to  seven post-treatment, a recurring issue after chemotherapy.  She has a small mouth sore and gum irritation, similar to previous post-chemotherapy experiences. Recent blood work shows normal white blood cell count, hemoglobin, and platelet levels.     ALLERGIES:  is allergic to pork-derived products.  MEDICATIONS:  Current Outpatient Medications  Medication Sig Dispense Refill   dexamethasone  (DECADRON ) 4 MG tablet Take 1 tablet day after chemo and 1 tablet 2 days after chemo with food 8 tablet 0   oxyCODONE  (OXY IR/ROXICODONE ) 5 MG immediate release tablet Take 1 tablet (5 mg total) by mouth every 6 (six) hours as needed for severe pain (pain score 7-10). 10 tablet 0   Cyanocobalamin (B-12 PO) Take 1 tablet by mouth once a week. (Patient not taking: Reported on 04/06/2024)     lidocaine -prilocaine  (EMLA ) cream Apply to affected area once (Patient not taking: Reported on 04/06/2024) 30 g 3   Omega-3 Fatty Acids (OMEGA 3 PO) Take 1 tablet by mouth once a week. (Patient not taking: Reported on 04/06/2024)     ondansetron  (ZOFRAN ) 8 MG tablet Take 1 tab (8 mg) by mouth every 8 hrs as needed for nausea/vomiting. Start third day after doxorubicin/cyclophosphamide chemotherapy. (Patient not taking: Reported on 04/06/2024) 30 tablet 1   prochlorperazine  (COMPAZINE ) 10 MG tablet Take 1 tablet (10 mg total) by mouth every 6 (six) hours as needed for nausea or vomiting. (Patient not taking: Reported on 04/06/2024) 30 tablet 1   No current facility-administered medications for this visit.    PHYSICAL EXAMINATION: ECOG PERFORMANCE STATUS: 1 - Symptomatic but completely ambulatory  Vitals:   04/13/24 1610  BP: 124/69  Pulse: 98  Resp: 16  Temp: 98.5 F (36.9 C)  SpO2: 100%   Filed Weights   04/13/24 0937  Weight: 159 lb 12.8 oz (72.5 kg)    Physical Exam    (exam performed in the presence of a chaperone)  LABORATORY DATA:  I have reviewed the data as listed    Latest Ref Rng & Units  04/13/2024    9:20 AM 04/06/2024    8:05 AM 03/30/2024    1:00 PM  CMP  Glucose 70 - 99 mg/dL 161  096  99   BUN 6 - 20 mg/dL 8  10  10    Creatinine 0.44 - 1.00 mg/dL 0.45  4.09  8.11   Sodium 135 - 145 mmol/L 137  137  134   Potassium 3.5 - 5.1 mmol/L 3.5  4.1  4.5   Chloride 98 - 111 mmol/L 105  104  106   CO2 22 - 32 mmol/L 26  27  24    Calcium 8.9 - 10.3 mg/dL 9.0  8.9  9.5   Total Protein 6.5 - 8.1 g/dL 7.1  7.0  7.9   Total Bilirubin 0.0 - 1.2 mg/dL 0.3  0.3  0.3   Alkaline Phos 38 - 126 U/L 91  48  51   AST 15 - 41 U/L 12  10  15    ALT 0 - 44 U/L 26  11  10      Lab Results  Component Value Date   WBC 4.7 04/13/2024   HGB 12.6 04/13/2024   HCT 38.8 04/13/2024   MCV 75.9 (L) 04/13/2024   PLT 191 04/13/2024   NEUTROABS 3.0 04/13/2024    ASSESSMENT & PLAN:  Malignant neoplasm of upper-outer quadrant of left breast in female, estrogen receptor positive (HCC) 03/22/2024:Screening mammogram detected 2 masses in the left breast UOQ.  Mammogram measured 1.3 cm and 1.1 cm.  Contrast-enhanced mammogram was performed which revealed additional smaller masses (2.4 cm and 1.1 cm) spanning 7 x 3 cm.  Ultrasound measured the mass at 1.6 cm and 0.8 cm along with 2 enlarged axillary lymph nodes 1 of which was biopsy positive.  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    Treatment plan Neoadjuvant chemotherapy with dose dense Adriamycin  and Cytoxan  every 2 weeks x 4 followed by Taxol weekly x 12 Breast conserving surgery with sentinel lymph node biopsy and targeted node dissection Adjuvant radiation therapy Adjuvant antiestrogen therapy CT CAP 04/06/2024: Left breast mass, left axillary lymph nodes, right hepatic lesion Liver MRI 04/11/2024: Liver hemangioma: Benign ------------------------------------------------------------------------------------------------------- Current Treatment: Cycle 1 day 8 dose dense Adriamycin  and Cytoxan  ECHO EF 60-65% Chemo  toxicities: Nausea lasted 2 to 3 days Fatigue Return to clinic in 1 week for cycle 2 Assessment & Plan Chemotherapy-induced nausea Mild nausea managed with antiemetic medication. - Continue antiemetic medication as needed.  Chemotherapy-induced oral mucositis Mild oral mucositis with gum irritation. - Use oral care powder for gum irritation.  Chemotherapy-induced fatigue Significant fatigue expected to improve by next week.  Chemotherapy-induced dizziness Dizziness expected to resolve by next week.  Follow-up Next chemotherapy session scheduled. Blood work normal. - Schedule follow-up appointment for next chemotherapy session next week.      No orders of the defined types were placed in this encounter.  The patient has a good understanding of the overall plan. she agrees with it. she will call with any problems that may develop before the next visit here.  Total time spent: 30 mins including face to face time and time spent for planning, charting and co-ordination of care   Margert Sheerer, MD 04/13/24

## 2024-04-13 NOTE — Assessment & Plan Note (Signed)
 03/22/2024:Screening mammogram detected 2 masses in the left breast UOQ.  Mammogram measured 1.3 cm and 1.1 cm.  Contrast-enhanced mammogram was performed which revealed additional smaller masses (2.4 cm and 1.1 cm) spanning 7 x 3 cm.  Ultrasound measured the mass at 1.6 cm and 0.8 cm along with 2 enlarged axillary lymph nodes 1 of which was biopsy positive.  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    Treatment plan Neoadjuvant chemotherapy with dose dense Adriamycin and Cytoxan every 2 weeks x 4 followed by Taxol weekly x 12 Breast conserving surgery with sentinel lymph node biopsy and targeted node dissection Adjuvant radiation therapy Adjuvant antiestrogen therapy CT CAP 04/06/2024: Left breast mass, left axillary lymph nodes, right hepatic lesion Liver MRI 04/11/2024: Liver hemangioma: Benign ------------------------------------------------------------------------------------------------------- Current Treatment: Cycle 1 day 8 dose dense Adriamycin and Cytoxan ECHO EF 60-65% Chemo toxicities:  Return to clinic in 1 week for cycle 2

## 2024-04-14 ENCOUNTER — Telehealth: Payer: Self-pay | Admitting: *Deleted

## 2024-04-14 ENCOUNTER — Other Ambulatory Visit: Payer: Self-pay | Admitting: Hematology and Oncology

## 2024-04-14 MED ORDER — ALPRAZOLAM 0.25 MG PO TABS: 0.2500 mg | ORAL_TABLET | Freq: Every evening | ORAL | 0 refills | Status: DC | PRN

## 2024-04-14 NOTE — Telephone Encounter (Signed)
 Received call from pt spouse stating pt is experiencing right sided rib pain that is exacerbated when she takes a deep breath in.  Also states pt is experiencing severe anxiety and difficulty sleeping at night due to cancer dx. RN reviewed with MD who states pt may be experiencing bone pain from Neulasta injection. MD also states he will call in PRN Xanax to pt pharmacy on file for anxiety and sleep.  Pt spouse educated and verbalized understanding.

## 2024-04-14 NOTE — Telephone Encounter (Signed)
 Received VM from pt spouse stating pt is experiencing trouble breathing.  RN attempt x1 to retrun call,  no answer.  Unable to LVM due to VM not being set up.

## 2024-04-19 NOTE — Assessment & Plan Note (Signed)
 03/22/2024:Screening mammogram detected 2 masses in the left breast UOQ.  Mammogram measured 1.3 cm and 1.1 cm.  Contrast-enhanced mammogram was performed which revealed additional smaller masses (2.4 cm and 1.1 cm) spanning 7 x 3 cm.  Ultrasound measured the mass at 1.6 cm and 0.8 cm along with 2 enlarged axillary lymph nodes 1 of which was biopsy positive.  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    Treatment plan Neoadjuvant chemotherapy with dose dense Adriamycin  and Cytoxan  every 2 weeks x 4 followed by Taxol weekly x 12 Breast conserving surgery with sentinel lymph node biopsy and targeted node dissection Adjuvant radiation therapy Adjuvant antiestrogen therapy CT CAP 04/06/2024: Left breast mass, left axillary lymph nodes, right hepatic lesion Liver MRI 04/11/2024: Liver hemangioma: Benign ------------------------------------------------------------------------------------------------------- Current Treatment: Cycle 2 dose dense Adriamycin  and Cytoxan  ECHO EF 60-65% Chemo toxicities: Nausea lasted 2 to 3 days Fatigue Return to clinic in 2 weeks for cycle 3

## 2024-04-20 ENCOUNTER — Inpatient Hospital Stay: Attending: Licensed Clinical Social Worker

## 2024-04-20 ENCOUNTER — Inpatient Hospital Stay: Admitting: Licensed Clinical Social Worker

## 2024-04-20 ENCOUNTER — Encounter: Payer: Self-pay | Admitting: Hematology and Oncology

## 2024-04-20 ENCOUNTER — Inpatient Hospital Stay

## 2024-04-20 ENCOUNTER — Inpatient Hospital Stay (HOSPITAL_BASED_OUTPATIENT_CLINIC_OR_DEPARTMENT_OTHER): Admitting: Hematology and Oncology

## 2024-04-20 VITALS — BP 118/70 | HR 90 | Temp 98.7°F | Resp 18 | Ht 60.0 in | Wt 160.7 lb

## 2024-04-20 VITALS — BP 124/75 | HR 86 | Temp 98.4°F | Resp 16

## 2024-04-20 DIAGNOSIS — Z95828 Presence of other vascular implants and grafts: Secondary | ICD-10-CM

## 2024-04-20 DIAGNOSIS — Z170 Estrogen receptor positive status [ER+]: Secondary | ICD-10-CM | POA: Insufficient documentation

## 2024-04-20 DIAGNOSIS — R5383 Other fatigue: Secondary | ICD-10-CM | POA: Diagnosis not present

## 2024-04-20 DIAGNOSIS — Z1721 Progesterone receptor positive status: Secondary | ICD-10-CM | POA: Diagnosis not present

## 2024-04-20 DIAGNOSIS — K5900 Constipation, unspecified: Secondary | ICD-10-CM | POA: Insufficient documentation

## 2024-04-20 DIAGNOSIS — Z5111 Encounter for antineoplastic chemotherapy: Secondary | ICD-10-CM | POA: Diagnosis present

## 2024-04-20 DIAGNOSIS — C50412 Malignant neoplasm of upper-outer quadrant of left female breast: Secondary | ICD-10-CM

## 2024-04-20 DIAGNOSIS — Z1732 Human epidermal growth factor receptor 2 negative status: Secondary | ICD-10-CM | POA: Diagnosis not present

## 2024-04-20 DIAGNOSIS — R112 Nausea with vomiting, unspecified: Secondary | ICD-10-CM | POA: Diagnosis not present

## 2024-04-20 DIAGNOSIS — Z5189 Encounter for other specified aftercare: Secondary | ICD-10-CM | POA: Insufficient documentation

## 2024-04-20 DIAGNOSIS — Z79899 Other long term (current) drug therapy: Secondary | ICD-10-CM | POA: Diagnosis not present

## 2024-04-20 DIAGNOSIS — T451X5A Adverse effect of antineoplastic and immunosuppressive drugs, initial encounter: Secondary | ICD-10-CM | POA: Diagnosis not present

## 2024-04-20 LAB — CBC WITH DIFFERENTIAL (CANCER CENTER ONLY)
Abs Immature Granulocytes: 0.62 10*3/uL — ABNORMAL HIGH (ref 0.00–0.07)
Basophils Absolute: 0.1 10*3/uL (ref 0.0–0.1)
Basophils Relative: 1 %
Eosinophils Absolute: 0.1 10*3/uL (ref 0.0–0.5)
Eosinophils Relative: 1 %
HCT: 37.5 % (ref 36.0–46.0)
Hemoglobin: 12.2 g/dL (ref 12.0–15.0)
Immature Granulocytes: 7 %
Lymphocytes Relative: 22 %
Lymphs Abs: 2 10*3/uL (ref 0.7–4.0)
MCH: 24.5 pg — ABNORMAL LOW (ref 26.0–34.0)
MCHC: 32.5 g/dL (ref 30.0–36.0)
MCV: 75.3 fL — ABNORMAL LOW (ref 80.0–100.0)
Monocytes Absolute: 0.5 10*3/uL (ref 0.1–1.0)
Monocytes Relative: 5 %
Neutro Abs: 6.1 10*3/uL (ref 1.7–7.7)
Neutrophils Relative %: 64 %
Platelet Count: 249 10*3/uL (ref 150–400)
RBC: 4.98 MIL/uL (ref 3.87–5.11)
RDW: 14.3 % (ref 11.5–15.5)
WBC Count: 9.4 10*3/uL (ref 4.0–10.5)
nRBC: 0 % (ref 0.0–0.2)

## 2024-04-20 LAB — CMP (CANCER CENTER ONLY)
ALT: 26 U/L (ref 0–44)
AST: 14 U/L — ABNORMAL LOW (ref 15–41)
Albumin: 4.2 g/dL (ref 3.5–5.0)
Alkaline Phosphatase: 84 U/L (ref 38–126)
Anion gap: 8 (ref 5–15)
BUN: 7 mg/dL (ref 6–20)
CO2: 25 mmol/L (ref 22–32)
Calcium: 8.9 mg/dL (ref 8.9–10.3)
Chloride: 107 mmol/L (ref 98–111)
Creatinine: 0.54 mg/dL (ref 0.44–1.00)
GFR, Estimated: >60 mL/min (ref >60)
Glucose, Bld: 140 mg/dL — ABNORMAL HIGH (ref 70–99)
Potassium: 3.5 mmol/L (ref 3.5–5.1)
Sodium: 140 mmol/L (ref 135–145)
Total Bilirubin: 0.2 mg/dL (ref 0.0–1.2)
Total Protein: 7.1 g/dL (ref 6.5–8.1)

## 2024-04-20 LAB — PREGNANCY, URINE: Preg Test, Ur: NEGATIVE

## 2024-04-20 MED ORDER — DEXAMETHASONE SODIUM PHOSPHATE 10 MG/ML IJ SOLN
10.0000 mg | Freq: Once | INTRAMUSCULAR | Status: AC
  Administered 2024-04-20: 10 mg via INTRAVENOUS
  Filled 2024-04-20: qty 1

## 2024-04-20 MED ORDER — SODIUM CHLORIDE 0.9 % IV SOLN: INTRAVENOUS | Status: DC

## 2024-04-20 MED ORDER — SODIUM CHLORIDE 0.9 % IV SOLN
600.0000 mg/m2 | Freq: Once | INTRAVENOUS | Status: AC
  Administered 2024-04-20: 1000 mg via INTRAVENOUS
  Filled 2024-04-20: qty 50

## 2024-04-20 MED ORDER — DOXORUBICIN HCL CHEMO IV INJECTION 2 MG/ML
60.0000 mg/m2 | Freq: Once | INTRAVENOUS | Status: AC
  Administered 2024-04-20: 106 mg via INTRAVENOUS
  Filled 2024-04-20: qty 53

## 2024-04-20 MED ORDER — SODIUM CHLORIDE 0.9% FLUSH
10.0000 mL | Freq: Once | INTRAVENOUS | Status: AC
  Administered 2024-04-20: 10 mL

## 2024-04-20 MED ORDER — PALONOSETRON HCL INJECTION 0.25 MG/5ML
0.2500 mg | Freq: Once | INTRAVENOUS | Status: AC
  Administered 2024-04-20: 0.25 mg via INTRAVENOUS
  Filled 2024-04-20: qty 5

## 2024-04-20 MED ORDER — SODIUM CHLORIDE 0.9% FLUSH: 10.0000 mL | INTRAVENOUS | Status: DC | PRN

## 2024-04-20 MED ORDER — SODIUM CHLORIDE 0.9 % IV SOLN
150.0000 mg | Freq: Once | INTRAVENOUS | Status: AC
  Administered 2024-04-20: 150 mg via INTRAVENOUS
  Filled 2024-04-20: qty 150

## 2024-04-20 NOTE — Progress Notes (Signed)
 Patient's spouse provided remaining documents for [employer]. Patient completed grant paperwork and received a copy of the approval letter and expense sheet in green folder to review.   They have my card to contact me at earliest convenience to discuss in detail.

## 2024-04-20 NOTE — Progress Notes (Signed)
 [facility] CSW Progress Note  Visual merchandiser met with patient in infusion to follow-up on coping and resources.  Pt expressed that she is doing well. Reported no needs at this time. She is hopeful to get through all of her treatment into survivorship and be able to re-engage in activities, like English class.  CSW reminded pt of support services availability.    Wanda Garcia E Wanda Chisom, LCSW Clinical Social Worker [facility]    Patient is participating in a Managed Medicaid Plan:  Yes

## 2024-04-20 NOTE — Patient Instructions (Signed)
 [facility] - A DEPT OF [facility]  Discharge Instructions: Thank you for choosing [facility] to provide your oncology and hematology care.   If you have a lab appointment with the Cancer Center, please go directly to the Cancer Center and check in at the registration area.   Wear comfortable clothing and clothing appropriate for easy access to any Portacath or PICC line.   We strive to give you quality time with your provider. You may need to reschedule your appointment if you arrive late (15 or more minutes).  Arriving late affects you and other patients whose appointments are after yours.  Also, if you miss three or more appointments without notifying the office, you may be dismissed from the clinic at the provider's discretion.      For prescription refill requests, have your pharmacy contact our office and allow 72 hours for refills to be completed.    Today you received the following chemotherapy and/or immunotherapy agents: Doxorubicin, Cytoxan.       To help prevent nausea and vomiting after your treatment, we encourage you to take your nausea medication as directed.  BELOW ARE SYMPTOMS THAT SHOULD BE REPORTED IMMEDIATELY: *FEVER GREATER THAN 100.4 F (38 C) OR HIGHER *CHILLS OR SWEATING *NAUSEA AND VOMITING THAT IS NOT CONTROLLED WITH YOUR NAUSEA MEDICATION *UNUSUAL SHORTNESS OF BREATH *UNUSUAL BRUISING OR BLEEDING *URINARY PROBLEMS (pain or burning when urinating, or frequent urination) *BOWEL PROBLEMS (unusual diarrhea, constipation, pain near the anus) TENDERNESS IN MOUTH AND THROAT WITH OR WITHOUT PRESENCE OF ULCERS (sore throat, sores in mouth, or a toothache) UNUSUAL RASH, SWELLING OR PAIN  UNUSUAL VAGINAL DISCHARGE OR ITCHING   Items with * indicate a potential emergency and should be followed up as soon as possible or go to the Emergency Department if any problems should occur.  Please show the CHEMOTHERAPY ALERT CARD or  IMMUNOTHERAPY ALERT CARD at check-in to the Emergency Department and triage nurse.  Should you have questions after your visit or need to cancel or reschedule your appointment, please contact [facility] - A DEPT OF [facility]  Dept: [phone]  and follow the prompts.  Office hours are 8:00 a.m. to 4:30 p.m. Monday - Friday. Please note that voicemails left after 4:00 p.m. may not be returned until the following business day.  We are closed weekends and major holidays. You have access to a nurse at all times for urgent questions. Please call the main number to the clinic Dept: [phone] and follow the prompts.   For any non-urgent questions, you may also contact your provider using MyChart. We now offer e-Visits for anyone 2 and older to request care online for non-urgent symptoms. For details visit [email or web address].   Also download the MyChart app! Go to the app store, search "MyChart", open the app, select [place], and log in with your MyChart username and password.  Doxorubicin Liposomal Injection ?? ??? ??????? ????? ???????????? ????????? ??? ????? ???????. ??? ???? ?? ???? ????? ??? ??????? ?????????. ???? ??????? ??? ?????? ?????? ????? ???? ???? ??????? ?????? ?? ??????? ??? ???? ???? ?????. ????? ???????? ???????? ????????:? Doxil, Lipodox ?? ?? ??????? ???? ??? ?? ???? ??? ???? ??????? ?????? ??? ????? ??? ???????  ???????? ??? ????? ?? ??? ??? ?????? ??? ?? ??? ???????:  ??? ???????? ????  ????? ?????  ??????? ????? ?????? ????????? ??? ???? ?????? ???? ?????? ??????  ????? ?????  ??????? ?????? ?? ???????  ?? ??? ??? ???? ?? ?????? ???? ????????????? ?? ??? ??????? ?? ????? ????? ?? ?????? ?? ?????? ?? ???? ?????  ??? ???? ????? ?????? ?? ????? ?????? ?? ??? ??? ????? ????? ????? ?????? ?? ???? ????? ??????? ???????? ??? ??? ???? ??????? ??? ??????? ????? ??? ?????? ?? ??????. ??? ?????? ?????? ???? ?????? ?? ?????? ??  ?????. ???? ??? ???? ?????? ???? ??????? ??? ?????? ???????. ??? ???? ???? ???? ??? ???? ????? ???? ???. ?????? ??????? : ??? ?????? ??? ?????? ???? ????? ???? ?? ??? ??????? ???? ????? ?????? ?? ?????? ?? ???? ??????? ?????.<  br />??????: ?????? ??? ?????? ?? ???? ??? ???. ?? ????? ????? ??? ?? ????? ??? ??????. ???? ?? ???? (????) ????? ???? ??? ?????? ????? ????????. ?? ????? ??? ????? ?????. ???? ????? ?????? ??? ??? ??? ???? ??? ???????? ??? ??? ????????. ?? ?? ??????? ???? ?? ?????? ?? ??? ???????  ?? ?????? ??? ?????? ?? ?? ??? ???: ?????????  ??? ?????? ???? ?? ?????? ????? ?? ?? ???:  ??????? ???? ???? ?? ????? ???? ???? ??????????? ?????????????? ???????????? ???????? ??? ??????? ?? ?? ??? ?? ????????? ????????. ?? ?????? ???? ??????? ?????? ????? ??? ??????? ?? ??????? ?? ??????? ???? ???? ?? ???????? ???????? ???? ????????. ?????? ????? ??? ??? ???? ?? ???? ?????? ?? ?????? ?????? ??? ???????. ?? ?????? ??? ??????? ?? ?????. ?? ???? ??? ???? ????? ??? ??????? ??? ??????? ???? ?????? ????? ?????? ????? ???? ??? ??????. ?? ????? ?????? ???????? ?? ????? ????? ??? ??????. ??? ?????? ?? ????? ???? ???? ????? ????? ???? ???. ??? ??? ??? ????? ??? ????? ??? ?????? ???????? ???? ?? ???? ??? ??????? ??????? ??? ???? ??? ??????? ?????????. ???? ?? ?? ???? ??????. ????? ?? ???? ????? ??? ?? ??? ???? ?????? ?? ?? ????? ???? ?????? ???????. ?? ????? ??? ???? ??? ?????????-?????? ????? ???? ??? ?????. ??? ??? ????. ??? ??? ???? ?????? ?? ?????? ????? ????? ??????. ?? ??? ???????? ?? ??? ?????? ????? ?????? ???????? ?? ???? ?????? ????????. ???? ???? ????????? ?????? ??????????. ???? ??? ???? ?????? ??? ??? ?????? ????????. ?? ???? ?????? ????? ????? ???? ??????? ?????? ????? ?? ??????? ??? ?????? ??? ??????. ??? ?????? ??? ???? ?????? ??? ??? ?? ?????? ?? ????? ??????? ?? ??? ???? ????? ???? ?? ???? ??????. ?? ???? ?????? ????? ????? ??? ??? ???????? ??? ?????? ????? ????? ?? ??? ??? ????? ???? 6  ???? ??? ??? ???? ???. ???? ???????? ????? ??? ????? ????? ????? ??? ?????? ????? 6 ???? ??? ?????? ???????. ???? ????? ?????? ??????? ?? ?????? ??? ?????? ??????? ??. ??? ???? ????? ????? ??? ?????? ?????? ?????? ???? ?????? ???? ?????? ???? ??? ?????? ????? 6 ???? ??? ?????? ???????. ?? ????? ???? ????? ?????? ????? ????? ??? ??????. ?? ???? ??? ?????? ?????. ???? ??? ???? ?????? ??? ??? ????? ???? ??????. ?? ?? ?????? ???????? ???? ???? ?? ??????? ??? ???? ??? ???????  ?????? ???????? ???? ??? ?? ???? ???? ?????? ??? ?? ???? ??? ????:  ??????? ????????--????? ??????? ?????? ??????????? (?????)? ???? ????? ?? ?????? ?? ?????? ?? ?????  ??? ?????--??? ??????? ???? ???????? ?? ??????? ?? ??????? ????? ????? ????????? ????? ?? ????? ??? ???????.  ??????--?????? ?????????? ??????? ?????? ?????? ?????? ???? ?? ?????? ????? ??? ?????? ?? ????? ??????? ?????? ????? ???? ?????? ?? ??????  ??????? ??????? -- ??? ?????? ??? ?????? ?? ????? ??????? ?????? ???????? ?? ??????  ?????? ??? ????? ???? ???????--????? ?? ????? ??? ???????? ??????? ??????? ????? ??????  ????????? ???????? ??????? ?? ????? ??? ?????? ???????? ????? ?? ???? ??? ?????  ?????? ???????? ???? ?? ????? ????? ????? ???? (???? ???? ?????? ??? ?????? ?? ???? ?????):  ?????  ???????  ????? ??????  ???????  ???? ?? ??????? ?? ???? ?? ?????? ???? ???? ?? ?????  ??? ???? ????? ??? ?? ????? ??? ??????? ??? ??????? ?? ?? ??? ?? ?????? ???????? ????????. ???? ?????? ????????? ?????? ?? ?????? ????????. ????? ??????? ?? ?????? ???????? ?????? ??????? ???????? ????????? (FDA) ??? ????? ?.[id number]??? ??? ??? ???? ???????? ??????? ????? ??? ?????? ?? ?????? ?? ?????. ?? ??? ?????? ?? ??????. ??????: ??? ??????? ????? ?? ????: ?? ?? ???? ??? ???????? ?? ????????? ???????. ??? ???? ???? ?? ????? ?? ??? ??????? ????? ??? ?????? ?? ??????? ?? ???? ??????? ??????.  Cyclophosphamide Injection ?? ??? ??????? ????? ????????????? ??? ?????  ???????. ??? ???? ?? ???? ????? ??? ??????? ?????????. ???? ??????? ??? ?????? ?????? ????? ???? ???? ??????? ?????? ?? ??????? ??? ???? ???? ?????. ????? ???????? ???????? ????????:? Cyclophosphamide, Cytoxan, Neosar ?? ?? ??????? ???? ??? ?? ???? ??? ???? ??????? ?????? ??? ????? ??? ???????  ???????? ??? ????? ?? ??? ??? ?????? ??? ?? ??? ???????:  ????? ?????  ??? ?????? ????? ????? ?? ??? ???? ?????  ????  ?????? ?? ?????  ????? ?????  ?????? ??????? ????? ???? (????? ???? ???????? ?? ??????? ???????? ?? ????? ???? ???????)  ????? ?????  ?????? ?????? ?????? ????????  ????? ??????  ?? ??? ??? ???? ?? ?????? ???? ?????????????? ?? ????? ????? ?? ?????? ?? ?????? ?? ???? ?????  ????? ?? ?????? ????? ??????? ???????? ??? ??? ???? ??????? ??? ??????? ????? ??? ?????? ?? ??????. ??? ?????? ?????? ???? ?????? ?? ?????? ?? ?????. ???? ??? ???? ?????? ???? ??????? ??? ?????? ???????. ??? ???? ???? ???? ??? ???? ????? ???? ???. ?????? ??????? : ??? ?????? ??? ?????? ???? ????? ???? ?? ??? ??????? ???? ????? ?????? ?? ?????? ?? ???? ??????? ?????.<br />??????: ?????? ??? ?????? ?? ???? ??? ???. ?? ????? ????? ??? ?? ????? ??? ??????. ???? ?? ???? (????) ????? ???? ??? ?????? ????? ????????. ?? ????? ??? ????? ?????. ???? ????? ?????? ??? ??? ??? ???? ??? ???????? ??? ??? ????????. ?? ?? ??????? ???? ?? ?????? ?? ??? ???????  ??????????? ?  ?????????  ??????????  ??? ?????? ????????? ???? ????? ????? ??? ??????? ??????? ?? ?????? ?????  ????? ????? ??????? ??? ????? ??? ?????????? ??????????? ?????????  ???????????  ????? ?????  ?????????  ???????????  ??????? ???? ???? ???????  ???????????  ???????????  ?????????? ???????? ??? ??????? ?? ?? ??? ?? ????????? ????????. ?? ?????? ???? ??????? ?????? ????? ??? ??????? ?? ??????? ?? ??????? ???? ???? ?? ???????? ???????? ???? ????????. ?????? ????? ??? ??? ???? ?? ???? ?????? ?? ?????? ?????? ??? ???????. ?? ?????? ??? ??????? ??  ?????. ?? ???? ??? ???? ????? ??? ??????? ??? ??????? ??? ?????? ?? ????? ???? ???? ????? ????? ???? ???. ??? ??? ??? ????? ??? ????? ??? ?????? ???????? ???? ?? ???? ??? ??????? ??????? ??? ???? ??? ??????? ?????????. ???? ?? ?? ???? ??????. ????? ?? ???? ????? ??? ?? ??? ???? ?????? ?? ?? ????? ???? ?????? ???????. ?? ????? ??? ????? ???????? ?? ????? ?????? ???? ??????. ??? ?????? ?? ???? ?? ??? ?????? ?????. ????? ???? ?????? ??? ???? ?????? ?? ???????? ?? ?????? ?????? ?? ????? ???? ?? ????? ????? ?? ??????????. ?? ????? ???? ?????. ???? ???? ???????? ???????? ??????. ???? ????? ??????? ???? ????? ??? ????????? ?? ????????????? ?? ??????????? ?? ?????????? ?? ?????????? ?? ?? ????? ???? ?????? ???? ???. ??? ??????? ?? ???? ??????? ???????. ???? ??? ?????? ?? ??????? ???? ??????? ?? ??????? ??? ??????? ???? ?? ????? ????? ?? ???? ?????? ????. ???? ???? ?????? ???? ?????? ??? ?????? ??? ?????? ??? ????? ??? ??????. ???? ????? ?? ??????? ?????? ??? ?????????. ???? ??????? ??? ?? ????? ?????. ??? ???????? ?? ????? ??? ??????. ???? ???? ?????? ???? ??? ??? ??? ?????? ????? ??? ??????. ???? ?? ????? ???? ??? ??????? ??? ?????? ??? ??????. ?? ???? ????? ?????? ??? ??????????? ??????????? ?? ??? ???? ??? ??????? ?? ??????. ?????? ?? ????? ????????? ???????? ????????? ??????? ???????? ?????? ?????. ?? ????? ?? ????? ?? 30 ????? ??? ??? ?????. ????? ??? ???? ?????? ??? ??? ?????? ?? ????? ?? ??????? ??? ????. ?? ???? ??? ?????? ?????? ????? ????? ??? ?? ?????? ????? ????? ?? ??? ??? ????? ???? ??? ???? ??? ??? ????. ??? ?? ???? ????? ?????? ????? ????? ??? ????? ?? ????? ??? ??????. ???? ???????? ????? ?????? ?? ????? ??? ????? ????? ????? ??? ?????? ????? ??? ???? ??? ??? ????. ????? ??? ???? ?????? ??? ????? ??? ????? ????????. ?? ???? ????? ????? ????? ??? ?????? ?????  4 ???? ??? ?????? ???????. ?????? ?????? ???? ??? ?????? ???????. ?? ????? ????? ?????? ????? ??????? ???? ?????? ?? ???? ????? ???? ??? ???  ???? ???. ?? ???? ??? ?????? ?????. ???? ??? ???? ?????? ??? ??? ????? ???? ??????. ???? ??? ???? ?????? ??? ??? ?????? ????????. ?? ???? ???? ???? ??????? ?????? ????? ?? ??????? ??? ??? ?????? ??? ??????. ?? ?? ?????? ???????? ???? ???? ?? ??????? ??? ???? ??? ???????  ?????? ???????? ???? ??? ?? ???? ???? ?????? ??? ?? ???? ??? ????:  ??????? ????????--????? ??????? ?????? ??????????? (?????)? ???? ????? ?? ?????? ?? ?????? ?? ?????  ?????? ?????? ?? ??? ??????? ?? ????? ??????  ??? ?????--??? ??????? ???? ???????? ?? ??????? ?? ??????? ????? ????? ????????? ????? ?? ????? ??? ???????.  ?????? ???? ?????--????? ?? ????? ??? ???????? ??? ??????? ????? ?? ?????? ????? ????? ??????? ?? ??? ????????? ??????? ???? ???????? ?? ??????? ?? ??????.  ???????? ?? ??? ?????--???? ????? ????? ?? ??? ?????? ????? ?????? ??????? ?????? ???????? ?? ??? ?????? ????? ?? ?????? ????? ??????  ??????--?????? ?????????? ??????? ?????? ?????? ?????? ???? ?? ?????? ????? ??? ?????? ?? ????? ??????? ?????? ????? ???? ?????? ?? ??????  ????? ?????--??? ???? ?????? ???? ???????? ?? ?????? ?? ???????  ????? ?????--????? ?? ????? ?????? ?????? ?? ?????? ????? ??????? ???????? ?????? ???? ?????? ????? ?????? ?????? ?? ?????? ?????? ????? ?? ???????? ????? ?? ????? ??? ???????  ?????? ??? ????? ???? ???????--????? ?? ????? ??? ???????? ??????? ??????? ????? ??????  ?????? ??????? ????????--??? ???????? ???????? ??????? ??????? ????????  ????? ?????? ?? ????? ?????? ????? ?? ???? ??? ?????  ?????? ???????? ???? ?? ????? ????? ????? ???? (???? ???? ?????? ??? ?????? ?? ???? ?????):  ????? ?????  ????? ????? ??? ?????? ?? ???? ????? ?? ??? ?????  ????? ??????  ???????  ???? ?? ??????? ?? ???? ?? ?????? ???? ???? ?? ????? ??? ??? ??????? ?? ?? ??? ?? ?????? ???????? ????????. ???? ?????? ????????? ?????? ?? ?????? ????????. ????? ??????? ?? ?????? ???????? ?????? ??????? ???????? ????????? (FDA) ??? ?????  [id number]??? ??? ??? ???? ???????? ??????? ????? ??? ?????? ?? ?????? ?? ?????. ?? ??? ?????? ?? ??????. ??????: ??? ??????? ????? ?? ????: ?? ?? ???? ??? ???????? ?? ????????? ???????. ??? ???? ???? ?? ????? ?? ??? ??????? ????? ??? ?????? ?? ??????? ?? ???? ??????? ??????.   2024 Elsevier/Gold Standard (2022-09-24 00:00:00)

## 2024-04-20 NOTE — Progress Notes (Signed)
 Patient Care Team: [facility] as PCP - Arch Ko, Linde Reveal, RN as Oncology Nurse Navigator Alane Hsu, RN as Oncology Nurse Navigator Cameron Cea, MD as Consulting Physician (Hematology and Oncology) Lockie Rima, MD as Consulting Physician (General Surgery) Colie Dawes, MD as Attending Physician (Radiation Oncology)  DIAGNOSIS:  Encounter Diagnosis  Name Primary?   Malignant neoplasm of upper-outer quadrant of left breast in female, estrogen receptor positive (HCC) Yes    SUMMARY OF ONCOLOGIC HISTORY: Oncology History  Malignant neoplasm of upper-outer quadrant of left breast in female, estrogen receptor positive (HCC)  03/22/2024 Initial Diagnosis   Screening mammogram detected 2 masses in the left breast UOQ.  Mammogram measured 1.3 cm and 1.1 cm.  Contrast-enhanced mammogram was performed which revealed additional smaller masses (2.4 cm and 1.1 cm) spanning 7 x 3 cm.  Ultrasound measured the mass at 1.6 cm and 0.8 cm along with 2 enlarged axillary lymph nodes 1 of which was biopsy positive.  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   03/30/2024 Cancer Staging   Staging form: Breast, AJCC 8th Edition - Clinical: Stage IIA (cT2, cN1, cM0, G1, ER+, PR+, HER2-) - Signed by Cameron Cea, MD on 03/30/2024 Stage prefix: Initial diagnosis Histologic grading system: 3 grade system   04/06/2024 -  Chemotherapy   Patient is on Treatment Plan : BREAST DOSE DENSE AC q14d / PACLitaxel q7d       CHIEF COMPLIANT: Cycle 2 dose dense Adriamycin  and Cytoxan   HISTORY OF PRESENT ILLNESS:  History of Present Illness Wanda Garcia is a 48 year old female undergoing chemotherapy who presents with nausea and bone pain.  Nausea occurs following chemotherapy sessions, peaking in intensity during the first couple of days post-treatment. Claritin is taken for one week post-chemotherapy, providing symptom relief.  Significant bone pain is present,  particularly in the legs, perceived as severe, especially in one leg. This pain is managed with a strong pain medication taken at night, offering relief for the following day.  Fatigue is present, but she expresses a desire to remain active. Her medication regimen includes Claritin post-chemotherapy and a strong pain medication at night for bone pain relief.     ALLERGIES:  is allergic to pork-derived products.  MEDICATIONS:  Current Outpatient Medications  Medication Sig Dispense Refill   ALPRAZolam  (XANAX ) 0.25 MG tablet Take 1 tablet (0.25 mg total) by mouth at bedtime as needed for anxiety. 30 tablet 0   Cyanocobalamin (B-12 PO) Take 1 tablet by mouth once a week.     Omega-3 Fatty Acids (OMEGA 3 PO) Take 1 tablet by mouth once a week.     prochlorperazine  (COMPAZINE ) 10 MG tablet Take 1 tablet (10 mg total) by mouth every 6 (six) hours as needed for nausea or vomiting. 30 tablet 1   dexamethasone  (DECADRON ) 4 MG tablet Take 1 tablet day after chemo and 1 tablet 2 days after chemo with food 8 tablet 0   lidocaine -prilocaine  (EMLA ) cream Apply to affected area once (Patient not taking: Reported on 04/06/2024) 30 g 3   ondansetron  (ZOFRAN ) 8 MG tablet Take 1 tab (8 mg) by mouth every 8 hrs as needed for nausea/vomiting. Start third day after doxorubicin /cyclophosphamide  chemotherapy. (Patient not taking: Reported on 04/06/2024) 30 tablet 1   oxyCODONE  (OXY IR/ROXICODONE ) 5 MG immediate release tablet Take 1 tablet (5 mg total) by mouth every 6 (six) hours as needed for severe pain (pain score 7-10). 10 tablet 0  No current facility-administered medications for this visit.    PHYSICAL EXAMINATION: ECOG PERFORMANCE STATUS: 1 - Symptomatic but completely ambulatory  There were no vitals filed for this visit. There were no vitals filed for this visit.  Physical Exam   (exam performed in the presence of a chaperone)  LABORATORY DATA:  I have reviewed the data as listed    Latest Ref  Rng & Units 04/13/2024    9:20 AM 04/06/2024    8:05 AM 03/30/2024    1:00 PM  CMP  Glucose 70 - 99 mg/dL 191  478  99   BUN 6 - 20 mg/dL 8  10  10    Creatinine 0.44 - 1.00 mg/dL 2.95  6.21  3.08   Sodium 135 - 145 mmol/L 137  137  134   Potassium 3.5 - 5.1 mmol/L 3.5  4.1  4.5   Chloride 98 - 111 mmol/L 105  104  106   CO2 22 - 32 mmol/L 26  27  24    Calcium 8.9 - 10.3 mg/dL 9.0  8.9  9.5   Total Protein 6.5 - 8.1 g/dL 7.1  7.0  7.9   Total Bilirubin 0.0 - 1.2 mg/dL 0.3  0.3  0.3   Alkaline Phos 38 - 126 U/L 91  48  51   AST 15 - 41 U/L 12  10  15    ALT 0 - 44 U/L 26  11  10      Lab Results  Component Value Date   WBC 9.4 04/20/2024   HGB 12.2 04/20/2024   HCT 37.5 04/20/2024   MCV 75.3 (L) 04/20/2024   PLT 249 04/20/2024   NEUTROABS 6.1 04/20/2024    ASSESSMENT & PLAN:  Malignant neoplasm of upper-outer quadrant of left breast in female, estrogen receptor positive (HCC) 03/22/2024:Screening mammogram detected 2 masses in the left breast UOQ.  Mammogram measured 1.3 cm and 1.1 cm.  Contrast-enhanced mammogram was performed which revealed additional smaller masses (2.4 cm and 1.1 cm) spanning 7 x 3 cm.  Ultrasound measured the mass at 1.6 cm and 0.8 cm along with 2 enlarged axillary lymph nodes 1 of which was biopsy positive.  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    Treatment plan Neoadjuvant chemotherapy with dose dense Adriamycin  and Cytoxan  every 2 weeks x 4 followed by Taxol weekly x 12 Breast conserving surgery with sentinel lymph node biopsy and targeted node dissection Adjuvant radiation therapy Adjuvant antiestrogen therapy CT CAP 04/06/2024: Left breast mass, left axillary lymph nodes, right hepatic lesion Liver MRI 04/11/2024: Liver hemangioma: Benign ------------------------------------------------------------------------------------------------------- Current Treatment: Cycle 2 dose dense Adriamycin  and Cytoxan  ECHO EF 60-65% Chemo  toxicities: Nausea lasted 2 to 3 days Fatigue Return to clinic in 2 weeks for cycle 3  Assessment & Plan Chemotherapy-induced nausea Nausea persists post-chemotherapy, expected to continue with future cycles. - Continue current antiemetic regimen as needed.  Chemotherapy-induced fatigue Fatigue expected to worsen with ongoing chemotherapy. - Encourage regular light physical activity.  Chemotherapy-induced bone pain Bone pain occurs post-chemotherapy, relieved by strong pain medication before sleep. - Continue current pain management regimen with strong pain medication before sleep.  Constipation Constipation possibly related to chemotherapy or pain medication. - Recommend Miralax for management. - Consider dietary adjustments, such as increased fiber intake.      No orders of the defined types were placed in this encounter.  The patient has a good understanding of the overall plan. she agrees with it.  she will call with any problems that may develop before the next visit here. Total time spent: 30 mins including face to face time and time spent for planning, charting and co-ordination of care   Margert Sheerer, MD 04/20/24

## 2024-04-21 ENCOUNTER — Encounter: Payer: Self-pay | Admitting: Hematology and Oncology

## 2024-04-21 ENCOUNTER — Ambulatory Visit

## 2024-04-21 NOTE — Progress Notes (Signed)
 Received call from patient's spouse to discuss [employer] grant expenses in detail. Discussed expenses while he had sheet present. He verbalized understanding.  He has my card for any additional financial questions or concerns.

## 2024-04-22 ENCOUNTER — Telehealth: Payer: Self-pay | Admitting: *Deleted

## 2024-04-22 ENCOUNTER — Inpatient Hospital Stay

## 2024-04-22 ENCOUNTER — Encounter: Payer: Self-pay | Admitting: Hematology and Oncology

## 2024-04-22 DIAGNOSIS — Z5111 Encounter for antineoplastic chemotherapy: Secondary | ICD-10-CM | POA: Diagnosis not present

## 2024-04-22 DIAGNOSIS — Z170 Estrogen receptor positive status [ER+]: Secondary | ICD-10-CM

## 2024-04-22 MED ORDER — PEGFILGRASTIM-CBQV 6 MG/0.6ML ~~LOC~~ SOSY
6.0000 mg | PREFILLED_SYRINGE | Freq: Once | SUBCUTANEOUS | Status: AC
  Administered 2024-04-22: 6 mg via SUBCUTANEOUS
  Filled 2024-04-22: qty 0.6

## 2024-04-22 NOTE — Telephone Encounter (Signed)
 Received call from pt stating her son is graduating today and would like to attend his graduation.  Pt requesting injection be pushed out to tomorrow.  RN verified with pharmacy team that it would be okay for pt to receive injection tomorrow.  Injection appt re-scheduled, pt verbalized understanding of appt date and time.

## 2024-04-22 NOTE — Telephone Encounter (Signed)
 Received call from pt stating she would like to change injection appt back to the original time (today at 1:15 pm).  Appt changed per pt request.

## 2024-04-23 ENCOUNTER — Ambulatory Visit

## 2024-04-24 ENCOUNTER — Other Ambulatory Visit: Payer: Self-pay

## 2024-04-26 ENCOUNTER — Other Ambulatory Visit: Payer: Self-pay

## 2024-05-04 ENCOUNTER — Inpatient Hospital Stay

## 2024-05-04 ENCOUNTER — Inpatient Hospital Stay: Admitting: Adult Health

## 2024-05-04 ENCOUNTER — Encounter: Payer: Self-pay | Admitting: Adult Health

## 2024-05-04 DIAGNOSIS — Z170 Estrogen receptor positive status [ER+]: Secondary | ICD-10-CM

## 2024-05-04 DIAGNOSIS — C50412 Malignant neoplasm of upper-outer quadrant of left female breast: Secondary | ICD-10-CM

## 2024-05-04 DIAGNOSIS — Z5111 Encounter for antineoplastic chemotherapy: Secondary | ICD-10-CM | POA: Diagnosis not present

## 2024-05-04 DIAGNOSIS — Z95828 Presence of other vascular implants and grafts: Secondary | ICD-10-CM

## 2024-05-04 LAB — CBC WITH DIFFERENTIAL (CANCER CENTER ONLY)
Abs Immature Granulocytes: 0.87 10*3/uL — ABNORMAL HIGH (ref 0.00–0.07)
Basophils Absolute: 0.2 10*3/uL — ABNORMAL HIGH (ref 0.0–0.1)
Basophils Relative: 2 %
Eosinophils Absolute: 0.1 10*3/uL (ref 0.0–0.5)
Eosinophils Relative: 1 %
HCT: 36.6 % (ref 36.0–46.0)
Hemoglobin: 11.7 g/dL — ABNORMAL LOW (ref 12.0–15.0)
Immature Granulocytes: 8 %
Lymphocytes Relative: 17 %
Lymphs Abs: 1.8 10*3/uL (ref 0.7–4.0)
MCH: 24.3 pg — ABNORMAL LOW (ref 26.0–34.0)
MCHC: 32 g/dL (ref 30.0–36.0)
MCV: 75.9 fL — ABNORMAL LOW (ref 80.0–100.0)
Monocytes Absolute: 0.6 10*3/uL (ref 0.1–1.0)
Monocytes Relative: 5 %
Neutro Abs: 7.4 10*3/uL (ref 1.7–7.7)
Neutrophils Relative %: 67 %
Platelet Count: 295 10*3/uL (ref 150–400)
RBC: 4.82 MIL/uL (ref 3.87–5.11)
RDW: 15.5 % (ref 11.5–15.5)
WBC Count: 10.9 10*3/uL — ABNORMAL HIGH (ref 4.0–10.5)
nRBC: 0.2 % (ref 0.0–0.2)

## 2024-05-04 LAB — CMP (CANCER CENTER ONLY)
ALT: 19 U/L (ref 0–44)
AST: 15 U/L (ref 15–41)
Albumin: 4.3 g/dL (ref 3.5–5.0)
Alkaline Phosphatase: 101 U/L (ref 38–126)
Anion gap: 7 (ref 5–15)
BUN: 9 mg/dL (ref 6–20)
CO2: 25 mmol/L (ref 22–32)
Calcium: 9.1 mg/dL (ref 8.9–10.3)
Chloride: 107 mmol/L (ref 98–111)
Creatinine: 0.57 mg/dL (ref 0.44–1.00)
GFR, Estimated: >60 mL/min (ref >60)
Glucose, Bld: 160 mg/dL — ABNORMAL HIGH (ref 70–99)
Potassium: 3.6 mmol/L (ref 3.5–5.1)
Sodium: 139 mmol/L (ref 135–145)
Total Bilirubin: 0.2 mg/dL (ref 0.0–1.2)
Total Protein: 7.2 g/dL (ref 6.5–8.1)

## 2024-05-04 MED ORDER — DOXORUBICIN HCL CHEMO IV INJECTION 2 MG/ML
60.0000 mg/m2 | Freq: Once | INTRAVENOUS | Status: AC
  Administered 2024-05-04: 106 mg via INTRAVENOUS
  Filled 2024-05-04: qty 53

## 2024-05-04 MED ORDER — DEXAMETHASONE SODIUM PHOSPHATE 10 MG/ML IJ SOLN
10.0000 mg | Freq: Once | INTRAMUSCULAR | Status: AC
  Administered 2024-05-04: 10 mg via INTRAVENOUS
  Filled 2024-05-04: qty 1

## 2024-05-04 MED ORDER — PALONOSETRON HCL INJECTION 0.25 MG/5ML
0.2500 mg | Freq: Once | INTRAVENOUS | Status: AC
  Administered 2024-05-04: 0.25 mg via INTRAVENOUS
  Filled 2024-05-04: qty 5

## 2024-05-04 MED ORDER — SODIUM CHLORIDE 0.9 % IV SOLN
150.0000 mg | Freq: Once | INTRAVENOUS | Status: AC
  Administered 2024-05-04: 150 mg via INTRAVENOUS
  Filled 2024-05-04: qty 150

## 2024-05-04 MED ORDER — DEXAMETHASONE 4 MG PO TABS: ORAL_TABLET | ORAL | 0 refills | Status: DC

## 2024-05-04 MED ORDER — SODIUM CHLORIDE 0.9 % IV SOLN
600.0000 mg/m2 | Freq: Once | INTRAVENOUS | Status: AC
  Administered 2024-05-04: 1000 mg via INTRAVENOUS
  Filled 2024-05-04: qty 50

## 2024-05-04 MED ORDER — SODIUM CHLORIDE 0.9% FLUSH
10.0000 mL | Freq: Once | INTRAVENOUS | Status: AC
  Administered 2024-05-04: 10 mL

## 2024-05-04 MED ORDER — SODIUM CHLORIDE 0.9 % IV SOLN: INTRAVENOUS | Status: DC

## 2024-05-04 NOTE — Progress Notes (Unsigned)
 [facility] Cancer Follow up:    Center, [facility] [address] [place] [address]   DIAGNOSIS: Cancer Staging  Malignant neoplasm of upper-outer quadrant of left breast in female, estrogen receptor positive (HCC) Staging form: Breast, AJCC 8th Edition - Clinical: Stage IIA (cT2, cN1, cM0, G1, ER+, PR+, HER2-) - Signed by Cameron Cea, MD on 03/30/2024 Stage prefix: Initial diagnosis Histologic grading system: 3 grade system    SUMMARY OF ONCOLOGIC HISTORY: Oncology History  Malignant neoplasm of upper-outer quadrant of left breast in female, estrogen receptor positive (HCC)  03/22/2024 Initial Diagnosis   Screening mammogram detected 2 masses in the left breast UOQ.  Mammogram measured 1.3 cm and 1.1 cm.  Contrast-enhanced mammogram was performed which revealed additional smaller masses (2.4 cm and 1.1 cm) spanning 7 x 3 cm.  Ultrasound measured the mass at 1.6 cm and 0.8 cm along with 2 enlarged axillary lymph nodes 1 of which was biopsy positive.  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   03/30/2024 Cancer Staging   Staging form: Breast, AJCC 8th Edition - Clinical: Stage IIA (cT2, cN1, cM0, G1, ER+, PR+, HER2-) - Signed by Cameron Cea, MD on 03/30/2024 Stage prefix: Initial diagnosis Histologic grading system: 3 grade system   04/06/2024 -  Chemotherapy   Patient is on Treatment Plan : BREAST DOSE DENSE AC q14d / PACLitaxel q7d       CURRENT THERAPY:  INTERVAL HISTORY:  Discussed the use of AI scribe software for clinical note transcription with the patient, who gave verbal consent to proceed.  History of Present Illness      Patient Active Problem List   Diagnosis Date Noted  . Port-A-Cath in place 04/06/2024  . Malignant neoplasm of upper-outer quadrant of left breast in female, estrogen receptor positive (HCC) 03/28/2024  . Neck pain 07/09/2018  . Trigger finger of left thumb 07/09/2018  . Pain in left  wrist 07/09/2018  . Pain in right wrist 07/09/2018  . S/P cesarean section 04/05/2017  . 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  MEDICAL HISTORY: Past Medical History:  Diagnosis Date  . Breast cancer (HCC)    left 03/2024    SURGICAL HISTORY: Past Surgical History:  Procedure Laterality Date  . BREAST BIOPSY Left 03/2024  . CESAREAN SECTION N/A 04/06/2017   Procedure: CESAREAN SECTION;  Surgeon: Julianne Octave, MD;  Location: [facility];  Service: Obstetrics;  Laterality: N/A;  . CHOLECYSTECTOMY N/A 04/26/2019   Procedure: LAPAROSCOPIC CHOLECYSTECTOMY WITH POSSIBLE INTRAOPERATIVE CHOLANGIOGRAM;  Surgeon: Oza Blumenthal, MD;  Location: [facility] OR;  Service: General;  Laterality: N/A;  . PORTACATH PLACEMENT N/A 04/04/2024   Procedure: INSERTION, TUNNELED CENTRAL VENOUS DEVICE, WITH PORT;  Surgeon: Lockie Rima, MD;  Location: [facility] OR;  Service: General;  Laterality: N/A;    SOCIAL HISTORY: Social History   Socioeconomic History  . Marital status: Married    Spouse name: Not on file  . Number of children: Not on file  . Years of education: Not on file  . Highest education level: Not on file  Occupational History  . Not on file  Tobacco Use  . Smoking status: Every Day    Types: Cigarettes  . Smokeless tobacco: Never  . Tobacco comments:    smokes 0-2 / day  Vaping Use  . Vaping status: Never Used  Substance and Sexual Activity  . Alcohol use:  No  . Drug use: No  . Sexual activity: Yes    Birth control/protection: None  Other Topics Concern  . Not on file  Social History Narrative  . Not on file   Social Drivers of Health   Financial Resource Strain: Not on file  Food Insecurity: No Food Insecurity (03/30/2024)   Hunger Vital Sign   . Worried About Programme researcher, broadcasting/film/video in the Last Year: Never true   . Ran Out of Food in the Last Year: Never true  Transportation Needs: Unmet  Transportation Needs (03/30/2024)   PRAPARE - Transportation   . Lack of Transportation (Medical): Yes   . Lack of Transportation (Non-Medical): No  Physical Activity: Not on file  Stress: Not on file  Social Connections: Not on file  Intimate Partner Violence: Patient Unable To Answer (03/30/2024)   Humiliation, Afraid, Rape, and Kick questionnaire   . Fear of Current or Ex-Partner: Patient unable to answer   . Emotionally Abused: Patient unable to answer   . Physically Abused: Patient unable to answer   . Sexually Abused: Patient unable to answer    FAMILY HISTORY: Family History  Problem Relation Age of Onset  . Diabetes Father   . Hypertension Father   . Breast cancer Neg Hx     Review of Systems - Oncology    PHYSICAL EXAMINATION    There were no vitals filed for this visit.  Physical Exam  LABORATORY DATA:  CBC    Component Value Date/Time   WBC 9.4 04/20/2024 0748   WBC 6.8 04/25/2019 1341   RBC 4.98 04/20/2024 0748   HGB 12.2 04/20/2024 0748   HGB 12.7 06/25/2018 1507   HCT 37.5 04/20/2024 0748   HCT 43.3 06/25/2018 1507   PLT 249 04/20/2024 0748   PLT 369 06/25/2018 1507   MCV 75.3 (L) 04/20/2024 0748   MCV 79 06/25/2018 1507   MCH 24.5 (L) 04/20/2024 0748   MCHC 32.5 04/20/2024 0748   RDW 14.3 04/20/2024 0748   RDW 14.4 06/25/2018 1507   LYMPHSABS 2.0 04/20/2024 0748   MONOABS 0.5 04/20/2024 0748   EOSABS 0.1 04/20/2024 0748   BASOSABS 0.1 04/20/2024 0748    CMP     Component Value Date/Time   NA 140 04/20/2024 0748   NA 142 06/25/2018 1507   K 3.5 04/20/2024 0748   CL 107 04/20/2024 0748   CO2 25 04/20/2024 0748   GLUCOSE 140 (H) 04/20/2024 0748   BUN 7 04/20/2024 0748   BUN 10 06/25/2018 1507   CREATININE 0.54 04/20/2024 0748   CALCIUM 8.9 04/20/2024 0748   PROT 7.1 04/20/2024 0748   PROT 7.0 06/25/2018 1507   ALBUMIN 4.2 04/20/2024 0748   ALBUMIN 4.6 06/25/2018 1507   AST 14 (L) 04/20/2024 0748   ALT 26 04/20/2024 0748    ALKPHOS 84 04/20/2024 0748   BILITOT 0.2 04/20/2024 0748   GFRNONAA >60 04/20/2024 0748   GFRAA >60 03/23/2019 1550     ASSESSMENT and THERAPY PLAN:   No problem-specific Assessment & Plan notes found for this encounter.     All questions were answered. The patient knows to call the clinic with any problems, questions or concerns. We can certainly see the patient much sooner if necessary.  Total encounter time:*** minutes*in face-to-face visit time, chart review, lab review, care coordination, order entry, and documentation of the encounter time.    Alwin Baars, NP 05/04/24 9:00 AM Medical Oncology and Hematology [facility] [address] [place], [place] [address] Tel. [phone]    Fax. [phone]  *Total Encounter Time as defined by the Centers for Medicare and Medicaid Services includes, in addition to the face-to-face time of a patient visit (documented in the note above) non-face-to-face time: obtaining and reviewing outside history, ordering and reviewing medications, tests or procedures, care coordination (communications with other health care professionals or caregivers) and documentation in the medical record.

## 2024-05-04 NOTE — Patient Instructions (Signed)
 [facility] - A DEPT OF [facility]  Discharge Instructions: Thank you for choosing [facility] to provide your oncology and hematology care.   If you have a lab appointment with the Cancer Center, please go directly to the Cancer Center and check in at the registration area.   Wear comfortable clothing and clothing appropriate for easy access to any Portacath or PICC line.   We strive to give you quality time with your provider. You may need to reschedule your appointment if you arrive late (15 or more minutes).  Arriving late affects you and other patients whose appointments are after yours.  Also, if you miss three or more appointments without notifying the office, you may be dismissed from the clinic at the provider's discretion.      For prescription refill requests, have your pharmacy contact our office and allow 72 hours for refills to be completed.    Today you received the following chemotherapy and/or immunotherapy agents: Doxorubicin, Cytoxan.       To help prevent nausea and vomiting after your treatment, we encourage you to take your nausea medication as directed.  BELOW ARE SYMPTOMS THAT SHOULD BE REPORTED IMMEDIATELY: *FEVER GREATER THAN 100.4 F (38 C) OR HIGHER *CHILLS OR SWEATING *NAUSEA AND VOMITING THAT IS NOT CONTROLLED WITH YOUR NAUSEA MEDICATION *UNUSUAL SHORTNESS OF BREATH *UNUSUAL BRUISING OR BLEEDING *URINARY PROBLEMS (pain or burning when urinating, or frequent urination) *BOWEL PROBLEMS (unusual diarrhea, constipation, pain near the anus) TENDERNESS IN MOUTH AND THROAT WITH OR WITHOUT PRESENCE OF ULCERS (sore throat, sores in mouth, or a toothache) UNUSUAL RASH, SWELLING OR PAIN  UNUSUAL VAGINAL DISCHARGE OR ITCHING   Items with * indicate a potential emergency and should be followed up as soon as possible or go to the Emergency Department if any problems should occur.  Please show the CHEMOTHERAPY ALERT CARD or  IMMUNOTHERAPY ALERT CARD at check-in to the Emergency Department and triage nurse.  Should you have questions after your visit or need to cancel or reschedule your appointment, please contact [facility] - A DEPT OF [facility]  Dept: [phone]  and follow the prompts.  Office hours are 8:00 a.m. to 4:30 p.m. Monday - Friday. Please note that voicemails left after 4:00 p.m. may not be returned until the following business day.  We are closed weekends and major holidays. You have access to a nurse at all times for urgent questions. Please call the main number to the clinic Dept: [phone] and follow the prompts.   For any non-urgent questions, you may also contact your provider using MyChart. We now offer e-Visits for anyone 2 and older to request care online for non-urgent symptoms. For details visit [email or web address].   Also download the MyChart app! Go to the app store, search "MyChart", open the app, select [place], and log in with your MyChart username and password.  Doxorubicin Liposomal Injection ?? ??? ??????? ????? ???????????? ????????? ??? ????? ???????. ??? ???? ?? ???? ????? ??? ??????? ?????????. ???? ??????? ??? ?????? ?????? ????? ???? ???? ??????? ?????? ?? ??????? ??? ???? ???? ?????. ????? ???????? ???????? ????????:? Doxil, Lipodox ?? ?? ??????? ???? ??? ?? ???? ??? ???? ??????? ?????? ??? ????? ??? ???????  ???????? ??? ????? ?? ??? ??? ?????? ??? ?? ??? ???????:  ??? ???????? ????  ????? ?????  ??????? ????? ?????? ????????? ??? ???? ?????? ???? ?????? ??????  ????? ?????  ??????? ?????? ?? ???????  ?? ??? ??? ???? ?? ?????? ???? ????????????? ?? ??? ??????? ?? ????? ????? ?? ?????? ?? ?????? ?? ???? ?????  ??? ???? ????? ?????? ?? ????? ?????? ?? ??? ??? ????? ????? ????? ?????? ?? ???? ????? ??????? ???????? ??? ??? ???? ??????? ??? ??????? ????? ??? ?????? ?? ??????. ??? ?????? ?????? ???? ?????? ?? ?????? ??  ?????. ???? ??? ???? ?????? ???? ??????? ??? ?????? ???????. ??? ???? ???? ???? ??? ???? ????? ???? ???. ?????? ??????? : ??? ?????? ??? ?????? ???? ????? ???? ?? ??? ??????? ???? ????? ?????? ?? ?????? ?? ???? ??????? ?????.<  br />??????: ?????? ??? ?????? ?? ???? ??? ???. ?? ????? ????? ??? ?? ????? ??? ??????. ???? ?? ???? (????) ????? ???? ??? ?????? ????? ????????. ?? ????? ??? ????? ?????. ???? ????? ?????? ??? ??? ??? ???? ??? ???????? ??? ??? ????????. ?? ?? ??????? ???? ?? ?????? ?? ??? ???????  ?? ?????? ??? ?????? ?? ?? ??? ???: ?????????  ??? ?????? ???? ?? ?????? ????? ?? ?? ???:  ??????? ???? ???? ?? ????? ???? ???? ??????????? ?????????????? ???????????? ???????? ??? ??????? ?? ?? ??? ?? ????????? ????????. ?? ?????? ???? ??????? ?????? ????? ??? ??????? ?? ??????? ?? ??????? ???? ???? ?? ???????? ???????? ???? ????????. ?????? ????? ??? ??? ???? ?? ???? ?????? ?? ?????? ?????? ??? ???????. ?? ?????? ??? ??????? ?? ?????. ?? ???? ??? ???? ????? ??? ??????? ??? ??????? ???? ?????? ????? ?????? ????? ???? ??? ??????. ?? ????? ?????? ???????? ?? ????? ????? ??? ??????. ??? ?????? ?? ????? ???? ???? ????? ????? ???? ???. ??? ??? ??? ????? ??? ????? ??? ?????? ???????? ???? ?? ???? ??? ??????? ??????? ??? ???? ??? ??????? ?????????. ???? ?? ?? ???? ??????. ????? ?? ???? ????? ??? ?? ??? ???? ?????? ?? ?? ????? ???? ?????? ???????. ?? ????? ??? ???? ??? ?????????-?????? ????? ???? ??? ?????. ??? ??? ????. ??? ??? ???? ?????? ?? ?????? ????? ????? ??????. ?? ??? ???????? ?? ??? ?????? ????? ?????? ???????? ?? ???? ?????? ????????. ???? ???? ????????? ?????? ??????????. ???? ??? ???? ?????? ??? ??? ?????? ????????. ?? ???? ?????? ????? ????? ???? ??????? ?????? ????? ?? ??????? ??? ?????? ??? ??????. ??? ?????? ??? ???? ?????? ??? ??? ?? ?????? ?? ????? ??????? ?? ??? ???? ????? ???? ?? ???? ??????. ?? ???? ?????? ????? ????? ??? ??? ???????? ??? ?????? ????? ????? ?? ??? ??? ????? ???? 6  ???? ??? ??? ???? ???. ???? ???????? ????? ??? ????? ????? ????? ??? ?????? ????? 6 ???? ??? ?????? ???????. ???? ????? ?????? ??????? ?? ?????? ??? ?????? ??????? ??. ??? ???? ????? ????? ??? ?????? ?????? ?????? ???? ?????? ???? ?????? ???? ??? ?????? ????? 6 ???? ??? ?????? ???????. ?? ????? ???? ????? ?????? ????? ????? ??? ??????. ?? ???? ??? ?????? ?????. ???? ??? ???? ?????? ??? ??? ????? ???? ??????. ?? ?? ?????? ???????? ???? ???? ?? ??????? ??? ???? ??? ???????  ?????? ???????? ???? ??? ?? ???? ???? ?????? ??? ?? ???? ??? ????:  ??????? ????????--????? ??????? ?????? ??????????? (?????)? ???? ????? ?? ?????? ?? ?????? ?? ?????  ??? ?????--??? ??????? ???? ???????? ?? ??????? ?? ??????? ????? ????? ????????? ????? ?? ????? ??? ???????.  ??????--?????? ?????????? ??????? ?????? ?????? ?????? ???? ?? ?????? ????? ??? ?????? ?? ????? ??????? ?????? ????? ???? ?????? ?? ??????  ??????? ??????? -- ??? ?????? ??? ?????? ?? ????? ??????? ?????? ???????? ?? ??????  ?????? ??? ????? ???? ???????--????? ?? ????? ??? ???????? ??????? ??????? ????? ??????  ????????? ???????? ??????? ?? ????? ??? ?????? ???????? ????? ?? ???? ??? ?????  ?????? ???????? ???? ?? ????? ????? ????? ???? (???? ???? ?????? ??? ?????? ?? ???? ?????):  ?????  ???????  ????? ??????  ???????  ???? ?? ??????? ?? ???? ?? ?????? ???? ???? ?? ?????  ??? ???? ????? ??? ?? ????? ??? ??????? ??? ??????? ?? ?? ??? ?? ?????? ???????? ????????. ???? ?????? ????????? ?????? ?? ?????? ????????. ????? ??????? ?? ?????? ???????? ?????? ??????? ???????? ????????? (FDA) ??? ????? ?.[id number]??? ??? ??? ???? ???????? ??????? ????? ??? ?????? ?? ?????? ?? ?????. ?? ??? ?????? ?? ??????. ??????: ??? ??????? ????? ?? ????: ?? ?? ???? ??? ???????? ?? ????????? ???????. ??? ???? ???? ?? ????? ?? ??? ??????? ????? ??? ?????? ?? ??????? ?? ???? ??????? ??????.  Cyclophosphamide Injection ?? ??? ??????? ????? ????????????? ??? ?????  ???????. ??? ???? ?? ???? ????? ??? ??????? ?????????. ???? ??????? ??? ?????? ?????? ????? ???? ???? ??????? ?????? ?? ??????? ??? ???? ???? ?????. ????? ???????? ???????? ????????:? Cyclophosphamide, Cytoxan, Neosar ?? ?? ??????? ???? ??? ?? ???? ??? ???? ??????? ?????? ??? ????? ??? ???????  ???????? ??? ????? ?? ??? ??? ?????? ??? ?? ??? ???????:  ????? ?????  ??? ?????? ????? ????? ?? ??? ???? ?????  ????  ?????? ?? ?????  ????? ?????  ?????? ??????? ????? ???? (????? ???? ???????? ?? ??????? ???????? ?? ????? ???? ???????)  ????? ?????  ?????? ?????? ?????? ????????  ????? ??????  ?? ??? ??? ???? ?? ?????? ???? ?????????????? ?? ????? ????? ?? ?????? ?? ?????? ?? ???? ?????  ????? ?? ?????? ????? ??????? ???????? ??? ??? ???? ??????? ??? ??????? ????? ??? ?????? ?? ??????. ??? ?????? ?????? ???? ?????? ?? ?????? ?? ?????. ???? ??? ???? ?????? ???? ??????? ??? ?????? ???????. ??? ???? ???? ???? ??? ???? ????? ???? ???. ?????? ??????? : ??? ?????? ??? ?????? ???? ????? ???? ?? ??? ??????? ???? ????? ?????? ?? ?????? ?? ???? ??????? ?????.<br />??????: ?????? ??? ?????? ?? ???? ??? ???. ?? ????? ????? ??? ?? ????? ??? ??????. ???? ?? ???? (????) ????? ???? ??? ?????? ????? ????????. ?? ????? ??? ????? ?????. ???? ????? ?????? ??? ??? ??? ???? ??? ???????? ??? ??? ????????. ?? ?? ??????? ???? ?? ?????? ?? ??? ???????  ??????????? ?  ?????????  ??????????  ??? ?????? ????????? ???? ????? ????? ??? ??????? ??????? ?? ?????? ?????  ????? ????? ??????? ??? ????? ??? ?????????? ??????????? ?????????  ???????????  ????? ?????  ?????????  ???????????  ??????? ???? ???? ???????  ???????????  ???????????  ?????????? ???????? ??? ??????? ?? ?? ??? ?? ????????? ????????. ?? ?????? ???? ??????? ?????? ????? ??? ??????? ?? ??????? ?? ??????? ???? ???? ?? ???????? ???????? ???? ????????. ?????? ????? ??? ??? ???? ?? ???? ?????? ?? ?????? ?????? ??? ???????. ?? ?????? ??? ??????? ??  ?????. ?? ???? ??? ???? ????? ??? ??????? ??? ??????? ??? ?????? ?? ????? ???? ???? ????? ????? ???? ???. ??? ??? ??? ????? ??? ????? ??? ?????? ???????? ???? ?? ???? ??? ??????? ??????? ??? ???? ??? ??????? ?????????. ???? ?? ?? ???? ??????. ????? ?? ???? ????? ??? ?? ??? ???? ?????? ?? ?? ????? ???? ?????? ???????. ?? ????? ??? ????? ???????? ?? ????? ?????? ???? ??????. ??? ?????? ?? ???? ?? ??? ?????? ?????. ????? ???? ?????? ??? ???? ?????? ?? ???????? ?? ?????? ?????? ?? ????? ???? ?? ????? ????? ?? ??????????. ?? ????? ???? ?????. ???? ???? ???????? ???????? ??????. ???? ????? ??????? ???? ????? ??? ????????? ?? ????????????? ?? ??????????? ?? ?????????? ?? ?????????? ?? ?? ????? ???? ?????? ???? ???. ??? ??????? ?? ???? ??????? ???????. ???? ??? ?????? ?? ??????? ???? ??????? ?? ??????? ??? ??????? ???? ?? ????? ????? ?? ???? ?????? ????. ???? ???? ?????? ???? ?????? ??? ?????? ??? ?????? ??? ????? ??? ??????. ???? ????? ?? ??????? ?????? ??? ?????????. ???? ??????? ??? ?? ????? ?????. ??? ???????? ?? ????? ??? ??????. ???? ???? ?????? ???? ??? ??? ??? ?????? ????? ??? ??????. ???? ?? ????? ???? ??? ??????? ??? ?????? ??? ??????. ?? ???? ????? ?????? ??? ??????????? ??????????? ?? ??? ???? ??? ??????? ?? ??????. ?????? ?? ????? ????????? ???????? ????????? ??????? ???????? ?????? ?????. ?? ????? ?? ????? ?? 30 ????? ??? ??? ?????. ????? ??? ???? ?????? ??? ??? ?????? ?? ????? ?? ??????? ??? ????. ?? ???? ??? ?????? ?????? ????? ????? ??? ?? ?????? ????? ????? ?? ??? ??? ????? ???? ??? ???? ??? ??? ????. ??? ?? ???? ????? ?????? ????? ????? ??? ????? ?? ????? ??? ??????. ???? ???????? ????? ?????? ?? ????? ??? ????? ????? ????? ??? ?????? ????? ??? ???? ??? ??? ????. ????? ??? ???? ?????? ??? ????? ??? ????? ????????. ?? ???? ????? ????? ????? ??? ?????? ?????  4 ???? ??? ?????? ???????. ?????? ?????? ???? ??? ?????? ???????. ?? ????? ????? ?????? ????? ??????? ???? ?????? ?? ???? ????? ???? ??? ???  ???? ???. ?? ???? ??? ?????? ?????. ???? ??? ???? ?????? ??? ??? ????? ???? ??????. ???? ??? ???? ?????? ??? ??? ?????? ????????. ?? ???? ???? ???? ??????? ?????? ????? ?? ??????? ??? ??? ?????? ??? ??????. ?? ?? ?????? ???????? ???? ???? ?? ??????? ??? ???? ??? ???????  ?????? ???????? ???? ??? ?? ???? ???? ?????? ??? ?? ???? ??? ????:  ??????? ????????--????? ??????? ?????? ??????????? (?????)? ???? ????? ?? ?????? ?? ?????? ?? ?????  ?????? ?????? ?? ??? ??????? ?? ????? ??????  ??? ?????--??? ??????? ???? ???????? ?? ??????? ?? ??????? ????? ????? ????????? ????? ?? ????? ??? ???????.  ?????? ???? ?????--????? ?? ????? ??? ???????? ??? ??????? ????? ?? ?????? ????? ????? ??????? ?? ??? ????????? ??????? ???? ???????? ?? ??????? ?? ??????.  ???????? ?? ??? ?????--???? ????? ????? ?? ??? ?????? ????? ?????? ??????? ?????? ???????? ?? ??? ?????? ????? ?? ?????? ????? ??????  ??????--?????? ?????????? ??????? ?????? ?????? ?????? ???? ?? ?????? ????? ??? ?????? ?? ????? ??????? ?????? ????? ???? ?????? ?? ??????  ????? ?????--??? ???? ?????? ???? ???????? ?? ?????? ?? ???????  ????? ?????--????? ?? ????? ?????? ?????? ?? ?????? ????? ??????? ???????? ?????? ???? ?????? ????? ?????? ?????? ?? ?????? ?????? ????? ?? ???????? ????? ?? ????? ??? ???????  ?????? ??? ????? ???? ???????--????? ?? ????? ??? ???????? ??????? ??????? ????? ??????  ?????? ??????? ????????--??? ???????? ???????? ??????? ??????? ????????  ????? ?????? ?? ????? ?????? ????? ?? ???? ??? ?????  ?????? ???????? ???? ?? ????? ????? ????? ???? (???? ???? ?????? ??? ?????? ?? ???? ?????):  ????? ?????  ????? ????? ??? ?????? ?? ???? ????? ?? ??? ?????  ????? ??????  ???????  ???? ?? ??????? ?? ???? ?? ?????? ???? ???? ?? ????? ??? ??? ??????? ?? ?? ??? ?? ?????? ???????? ????????. ???? ?????? ????????? ?????? ?? ?????? ????????. ????? ??????? ?? ?????? ???????? ?????? ??????? ???????? ????????? (FDA) ??? ?????  [id number]??? ??? ??? ???? ???????? ??????? ????? ??? ?????? ?? ?????? ?? ?????. ?? ??? ?????? ?? ??????. ??????: ??? ??????? ????? ?? ????: ?? ?? ???? ??? ???????? ?? ????????? ???????. ??? ???? ???? ?? ????? ?? ??? ??????? ????? ??? ?????? ?? ??????? ?? ???? ??????? ??????.   2024 Elsevier/Gold Standard (2022-09-24 00:00:00)

## 2024-05-06 ENCOUNTER — Inpatient Hospital Stay

## 2024-05-06 ENCOUNTER — Encounter: Payer: Self-pay | Admitting: Hematology and Oncology

## 2024-05-06 VITALS — BP 132/69 | HR 79 | Temp 98.3°F | Resp 18

## 2024-05-06 DIAGNOSIS — Z170 Estrogen receptor positive status [ER+]: Secondary | ICD-10-CM

## 2024-05-06 DIAGNOSIS — Z5111 Encounter for antineoplastic chemotherapy: Secondary | ICD-10-CM | POA: Diagnosis not present

## 2024-05-06 MED ORDER — PEGFILGRASTIM-CBQV 6 MG/0.6ML ~~LOC~~ SOSY
6.0000 mg | PREFILLED_SYRINGE | Freq: Once | SUBCUTANEOUS | Status: AC
  Administered 2024-05-06: 6 mg via SUBCUTANEOUS
  Filled 2024-05-06: qty 0.6

## 2024-05-06 NOTE — Progress Notes (Signed)
 Pt here for her Udenyca  injection. She had her infusion tx 2 days ago. She reports that she is having chest tightness and inflammation around her port that started after she finished tx the other day. Also, reports tiredness. Port area examined. No redness noted but slight puffiness around it. She also had the same bandaid on there from when she was deaccessed 2 days ago. Educated her on the importance of removing the bandaid to reduce risk of infection. Pt was in a hurry today so declined nurse's offer to have someone see her. Verdis Glade, RN and [place], [place] notified via secure chat. VSS, documented in chart. No distress noted. Interpreter present during entire visit.

## 2024-05-06 NOTE — Assessment & Plan Note (Signed)
 03/22/2024:Screening mammogram detected 2 masses in the left breast UOQ.  Mammogram measured 1.3 cm and 1.1 cm.  Contrast-enhanced mammogram was performed which revealed additional smaller masses (2.4 cm and 1.1 cm) spanning 7 x 3 cm.  Ultrasound measured the mass at 1.6 cm and 0.8 cm along with 2 enlarged axillary lymph nodes 1 of which was biopsy positive.  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    Treatment plan Neoadjuvant chemotherapy with dose dense Adriamycin  and Cytoxan  every 2 weeks x 4 followed by Taxol weekly x 12 Breast conserving surgery with sentinel lymph node biopsy and targeted node dissection Adjuvant radiation therapy Adjuvant antiestrogen therapy CT CAP 04/06/2024: Left breast mass, left axillary lymph nodes, right hepatic lesion Liver MRI 04/11/2024: Liver hemangioma: Benign ------------------------------------------------------------------------------------------------------- Current Treatment: Cycle 3 dose dense Adriamycin  and Cytoxan  ECHO EF 60-65%  Assessment and Plan Assessment & Plan Breast cancer undergoing chemotherapy Currently on third cycle of Taxol chemotherapy. 4 cycles of Adriamycin  and Cytoxan  planned to be followed by twelve cycles of weekly Taxol to eradicate cancer cells. Ultrasound may be performed after four cycles to assess cancer presence. - Continue chemotherapy regimen as planned. - Labs today within parameters - Proceed with cycle three of Adriamycin /Cytoxan  - Perform ultrasound after four cycles if necessary.  Nausea and vomiting due to chemotherapy Experiences nausea and vomiting post-chemotherapy. Dexamethasone  prescribed for two days post-chemotherapy to minimize insomnia. - Take dexamethasone  for two days after chemotherapy, one tablet each day. - Consider IV fluids on shot day for nausea and fatigue (patient declines at this time).  Insomnia due to medication Significant insomnia exacerbated by dexamethasone .  Alprazolam  discussed for sleep and anxiety, caution against nightly use to prevent dependency. - Take dexamethasone  for two days post-chemotherapy, in the morning with food. - Use alprazolam  at night as needed for sleep, avoid nightly use.  Cough with blood-tinged mucus Cough with blood-tinged mucus. Advised to monitor for worsening symptoms. - Use Tylenol  or Robitussin for cough management.  RTC in 2 days for injection and in 2 weeks for labs, f/u and her next treatment.

## 2024-05-15 ENCOUNTER — Other Ambulatory Visit: Payer: Self-pay

## 2024-05-18 ENCOUNTER — Ambulatory Visit

## 2024-05-18 ENCOUNTER — Ambulatory Visit: Admitting: Hematology and Oncology

## 2024-05-18 ENCOUNTER — Other Ambulatory Visit

## 2024-05-18 MED FILL — Fosaprepitant Dimeglumine For IV Infusion 150 MG (Base Eq): INTRAVENOUS | Qty: 5 | Status: AC

## 2024-05-18 NOTE — Assessment & Plan Note (Signed)
 03/22/2024:Screening mammogram detected 2 masses in the left breast UOQ.  Mammogram measured 1.3 cm and 1.1 cm.  Contrast-enhanced mammogram was performed which revealed additional smaller masses (2.4 cm and 1.1 cm) spanning 7 x 3 cm.  Ultrasound measured the mass at 1.6 cm and 0.8 cm along with 2 enlarged axillary lymph nodes 1 of which was biopsy positive.  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    Treatment plan Neoadjuvant chemotherapy with dose dense Adriamycin  and Cytoxan  every 2 weeks x 4 followed by Taxol weekly x 12 Breast conserving surgery with sentinel lymph node biopsy and targeted node dissection Adjuvant radiation therapy Adjuvant antiestrogen therapy CT CAP 04/06/2024: Left breast mass, left axillary lymph nodes, right hepatic lesion Liver MRI 04/11/2024: Liver hemangioma: Benign ------------------------------------------------------------------------------------------------------- Current Treatment: Cycle 4 dose dense Adriamycin  and Cytoxan  ECHO EF 60-65%  Chemo Toxicities: Nausea and Vomiting Insomnia Cough  RTC in 2 weeks for cycle 1 Taxol

## 2024-05-19 ENCOUNTER — Inpatient Hospital Stay (HOSPITAL_BASED_OUTPATIENT_CLINIC_OR_DEPARTMENT_OTHER): Admitting: Hematology and Oncology

## 2024-05-19 ENCOUNTER — Inpatient Hospital Stay: Attending: Licensed Clinical Social Worker

## 2024-05-19 ENCOUNTER — Inpatient Hospital Stay

## 2024-05-19 ENCOUNTER — Encounter: Payer: Self-pay | Admitting: Hematology and Oncology

## 2024-05-19 VITALS — BP 122/72 | HR 80 | Temp 98.5°F | Resp 16 | Ht 60.0 in | Wt 161.6 lb

## 2024-05-19 DIAGNOSIS — G620 Drug-induced polyneuropathy: Secondary | ICD-10-CM | POA: Insufficient documentation

## 2024-05-19 DIAGNOSIS — Z170 Estrogen receptor positive status [ER+]: Secondary | ICD-10-CM | POA: Diagnosis not present

## 2024-05-19 DIAGNOSIS — Z1732 Human epidermal growth factor receptor 2 negative status: Secondary | ICD-10-CM | POA: Insufficient documentation

## 2024-05-19 DIAGNOSIS — Z5111 Encounter for antineoplastic chemotherapy: Secondary | ICD-10-CM | POA: Insufficient documentation

## 2024-05-19 DIAGNOSIS — C50412 Malignant neoplasm of upper-outer quadrant of left female breast: Secondary | ICD-10-CM

## 2024-05-19 DIAGNOSIS — Z5189 Encounter for other specified aftercare: Secondary | ICD-10-CM | POA: Insufficient documentation

## 2024-05-19 DIAGNOSIS — D509 Iron deficiency anemia, unspecified: Secondary | ICD-10-CM | POA: Diagnosis not present

## 2024-05-19 DIAGNOSIS — T451X5A Adverse effect of antineoplastic and immunosuppressive drugs, initial encounter: Secondary | ICD-10-CM | POA: Insufficient documentation

## 2024-05-19 DIAGNOSIS — R530 Neoplastic (malignant) related fatigue: Secondary | ICD-10-CM | POA: Diagnosis not present

## 2024-05-19 DIAGNOSIS — Z1721 Progesterone receptor positive status: Secondary | ICD-10-CM | POA: Diagnosis not present

## 2024-05-19 DIAGNOSIS — K1231 Oral mucositis (ulcerative) due to antineoplastic therapy: Secondary | ICD-10-CM | POA: Insufficient documentation

## 2024-05-19 DIAGNOSIS — Z95828 Presence of other vascular implants and grafts: Secondary | ICD-10-CM

## 2024-05-19 DIAGNOSIS — D6481 Anemia due to antineoplastic chemotherapy: Secondary | ICD-10-CM | POA: Insufficient documentation

## 2024-05-19 LAB — CMP (CANCER CENTER ONLY)
ALT: 22 U/L (ref 0–44)
AST: 15 U/L (ref 15–41)
Albumin: 4.2 g/dL (ref 3.5–5.0)
Alkaline Phosphatase: 92 U/L (ref 38–126)
Anion gap: 7 (ref 5–15)
BUN: 11 mg/dL (ref 6–20)
CO2: 26 mmol/L (ref 22–32)
Calcium: 9.2 mg/dL (ref 8.9–10.3)
Chloride: 107 mmol/L (ref 98–111)
Creatinine: 0.54 mg/dL (ref 0.44–1.00)
GFR, Estimated: >60 mL/min (ref >60)
Glucose, Bld: 113 mg/dL — ABNORMAL HIGH (ref 70–99)
Potassium: 3.8 mmol/L (ref 3.5–5.1)
Sodium: 140 mmol/L (ref 135–145)
Total Bilirubin: 0.2 mg/dL (ref 0.0–1.2)
Total Protein: 6.9 g/dL (ref 6.5–8.1)

## 2024-05-19 LAB — CBC WITH DIFFERENTIAL (CANCER CENTER ONLY)
Abs Immature Granulocytes: 0.72 10*3/uL — ABNORMAL HIGH (ref 0.00–0.07)
Basophils Absolute: 0.1 10*3/uL (ref 0.0–0.1)
Basophils Relative: 1 %
Eosinophils Absolute: 0.2 10*3/uL (ref 0.0–0.5)
Eosinophils Relative: 2 %
HCT: 35 % — ABNORMAL LOW (ref 36.0–46.0)
Hemoglobin: 11.4 g/dL — ABNORMAL LOW (ref 12.0–15.0)
Immature Granulocytes: 7 %
Lymphocytes Relative: 18 %
Lymphs Abs: 1.7 10*3/uL (ref 0.7–4.0)
MCH: 24.8 pg — ABNORMAL LOW (ref 26.0–34.0)
MCHC: 32.6 g/dL (ref 30.0–36.0)
MCV: 76.1 fL — ABNORMAL LOW (ref 80.0–100.0)
Monocytes Absolute: 0.7 10*3/uL (ref 0.1–1.0)
Monocytes Relative: 7 %
Neutro Abs: 6.2 10*3/uL (ref 1.7–7.7)
Neutrophils Relative %: 65 %
Platelet Count: 299 10*3/uL (ref 150–400)
RBC: 4.6 MIL/uL (ref 3.87–5.11)
RDW: 16.9 % — ABNORMAL HIGH (ref 11.5–15.5)
WBC Count: 9.7 10*3/uL (ref 4.0–10.5)
nRBC: 0 % (ref 0.0–0.2)

## 2024-05-19 LAB — PREGNANCY, URINE: Preg Test, Ur: NEGATIVE

## 2024-05-19 MED ORDER — SODIUM CHLORIDE 0.9 % IV SOLN
400.0000 mg/m2 | Freq: Once | INTRAVENOUS | Status: AC
  Administered 2024-05-19: 700 mg via INTRAVENOUS
  Filled 2024-05-19: qty 35

## 2024-05-19 MED ORDER — PALONOSETRON HCL INJECTION 0.25 MG/5ML
0.2500 mg | Freq: Once | INTRAVENOUS | Status: AC
  Administered 2024-05-19: 0.25 mg via INTRAVENOUS
  Filled 2024-05-19: qty 5

## 2024-05-19 MED ORDER — SODIUM CHLORIDE 0.9 % IV SOLN
INTRAVENOUS | Status: DC
Start: 2024-05-19 — End: 2024-05-19

## 2024-05-19 MED ORDER — DEXAMETHASONE SODIUM PHOSPHATE 10 MG/ML IJ SOLN
10.0000 mg | Freq: Once | INTRAMUSCULAR | Status: AC
  Administered 2024-05-19: 10 mg via INTRAVENOUS
  Filled 2024-05-19: qty 1

## 2024-05-19 MED ORDER — DOXORUBICIN HCL CHEMO IV INJECTION 2 MG/ML
50.0000 mg/m2 | Freq: Once | INTRAVENOUS | Status: AC
  Administered 2024-05-19: 88 mg via INTRAVENOUS
  Filled 2024-05-19: qty 44

## 2024-05-19 MED ORDER — SODIUM CHLORIDE 0.9% FLUSH
10.0000 mL | Freq: Once | INTRAVENOUS | Status: AC
  Administered 2024-05-19: 10 mL

## 2024-05-19 MED ORDER — FOSAPREPITANT DIMEGLUMINE INJECTION 150 MG
150.0000 mg | Freq: Once | INTRAVENOUS | Status: AC
  Administered 2024-05-19: 150 mg via INTRAVENOUS
  Filled 2024-05-19: qty 150

## 2024-05-19 MED ORDER — SODIUM CHLORIDE 0.9% FLUSH
10.0000 mL | INTRAVENOUS | Status: DC | PRN
  Administered 2024-05-19: 10 mL

## 2024-05-19 NOTE — Patient Instructions (Signed)
 [facility] - A DEPT OF [facility]  Discharge Instructions: Thank you for choosing [facility] to provide your oncology and hematology care.   If you have a lab appointment with the Cancer Center, please go directly to the Cancer Center and check in at the registration area.   Wear comfortable clothing and clothing appropriate for easy access to any Portacath or PICC line.   We strive to give you quality time with your provider. You may need to reschedule your appointment if you arrive late (15 or more minutes).  Arriving late affects you and other patients whose appointments are after yours.  Also, if you miss three or more appointments without notifying the office, you may be dismissed from the clinic at the provider's discretion.      For prescription refill requests, have your pharmacy contact our office and allow 72 hours for refills to be completed.    Today you received the following chemotherapy and/or immunotherapy agents: Doxorubicin, Cytoxan.       To help prevent nausea and vomiting after your treatment, we encourage you to take your nausea medication as directed.  BELOW ARE SYMPTOMS THAT SHOULD BE REPORTED IMMEDIATELY: *FEVER GREATER THAN 100.4 F (38 C) OR HIGHER *CHILLS OR SWEATING *NAUSEA AND VOMITING THAT IS NOT CONTROLLED WITH YOUR NAUSEA MEDICATION *UNUSUAL SHORTNESS OF BREATH *UNUSUAL BRUISING OR BLEEDING *URINARY PROBLEMS (pain or burning when urinating, or frequent urination) *BOWEL PROBLEMS (unusual diarrhea, constipation, pain near the anus) TENDERNESS IN MOUTH AND THROAT WITH OR WITHOUT PRESENCE OF ULCERS (sore throat, sores in mouth, or a toothache) UNUSUAL RASH, SWELLING OR PAIN  UNUSUAL VAGINAL DISCHARGE OR ITCHING   Items with * indicate a potential emergency and should be followed up as soon as possible or go to the Emergency Department if any problems should occur.  Please show the CHEMOTHERAPY ALERT CARD or  IMMUNOTHERAPY ALERT CARD at check-in to the Emergency Department and triage nurse.  Should you have questions after your visit or need to cancel or reschedule your appointment, please contact [facility] - A DEPT OF [facility]  Dept: [phone]  and follow the prompts.  Office hours are 8:00 a.m. to 4:30 p.m. Monday - Friday. Please note that voicemails left after 4:00 p.m. may not be returned until the following business day.  We are closed weekends and major holidays. You have access to a nurse at all times for urgent questions. Please call the main number to the clinic Dept: [phone] and follow the prompts.   For any non-urgent questions, you may also contact your provider using MyChart. We now offer e-Visits for anyone 2 and older to request care online for non-urgent symptoms. For details visit [email or web address].   Also download the MyChart app! Go to the app store, search "MyChart", open the app, select [place], and log in with your MyChart username and password.  Doxorubicin Liposomal Injection ?? ??? ??????? ????? ???????????? ????????? ??? ????? ???????. ??? ???? ?? ???? ????? ??? ??????? ?????????. ???? ??????? ??? ?????? ?????? ????? ???? ???? ??????? ?????? ?? ??????? ??? ???? ???? ?????. ????? ???????? ???????? ????????:? Doxil, Lipodox ?? ?? ??????? ???? ??? ?? ???? ??? ???? ??????? ?????? ??? ????? ??? ???????  ???????? ??? ????? ?? ??? ??? ?????? ??? ?? ??? ???????:  ??? ???????? ????  ????? ?????  ??????? ????? ?????? ????????? ??? ???? ?????? ???? ?????? ??????  ????? ?????  ??????? ?????? ?? ???????  ?? ??? ??? ???? ?? ?????? ???? ????????????? ?? ??? ??????? ?? ????? ????? ?? ?????? ?? ?????? ?? ???? ?????  ??? ???? ????? ?????? ?? ????? ?????? ?? ??? ??? ????? ????? ????? ?????? ?? ???? ????? ??????? ???????? ??? ??? ???? ??????? ??? ??????? ????? ??? ?????? ?? ??????. ??? ?????? ?????? ???? ?????? ?? ?????? ??  ?????. ???? ??? ???? ?????? ???? ??????? ??? ?????? ???????. ??? ???? ???? ???? ??? ???? ????? ???? ???. ?????? ??????? : ??? ?????? ??? ?????? ???? ????? ???? ?? ??? ??????? ???? ????? ?????? ?? ?????? ?? ???? ??????? ?????.<  br />??????: ?????? ??? ?????? ?? ???? ??? ???. ?? ????? ????? ??? ?? ????? ??? ??????. ???? ?? ???? (????) ????? ???? ??? ?????? ????? ????????. ?? ????? ??? ????? ?????. ???? ????? ?????? ??? ??? ??? ???? ??? ???????? ??? ??? ????????. ?? ?? ??????? ???? ?? ?????? ?? ??? ???????  ?? ?????? ??? ?????? ?? ?? ??? ???: ?????????  ??? ?????? ???? ?? ?????? ????? ?? ?? ???:  ??????? ???? ???? ?? ????? ???? ???? ??????????? ?????????????? ???????????? ???????? ??? ??????? ?? ?? ??? ?? ????????? ????????. ?? ?????? ???? ??????? ?????? ????? ??? ??????? ?? ??????? ?? ??????? ???? ???? ?? ???????? ???????? ???? ????????. ?????? ????? ??? ??? ???? ?? ???? ?????? ?? ?????? ?????? ??? ???????. ?? ?????? ??? ??????? ?? ?????. ?? ???? ??? ???? ????? ??? ??????? ??? ??????? ???? ?????? ????? ?????? ????? ???? ??? ??????. ?? ????? ?????? ???????? ?? ????? ????? ??? ??????. ??? ?????? ?? ????? ???? ???? ????? ????? ???? ???. ??? ??? ??? ????? ??? ????? ??? ?????? ???????? ???? ?? ???? ??? ??????? ??????? ??? ???? ??? ??????? ?????????. ???? ?? ?? ???? ??????. ????? ?? ???? ????? ??? ?? ??? ???? ?????? ?? ?? ????? ???? ?????? ???????. ?? ????? ??? ???? ??? ?????????-?????? ????? ???? ??? ?????. ??? ??? ????. ??? ??? ???? ?????? ?? ?????? ????? ????? ??????. ?? ??? ???????? ?? ??? ?????? ????? ?????? ???????? ?? ???? ?????? ????????. ???? ???? ????????? ?????? ??????????. ???? ??? ???? ?????? ??? ??? ?????? ????????. ?? ???? ?????? ????? ????? ???? ??????? ?????? ????? ?? ??????? ??? ?????? ??? ??????. ??? ?????? ??? ???? ?????? ??? ??? ?? ?????? ?? ????? ??????? ?? ??? ???? ????? ???? ?? ???? ??????. ?? ???? ?????? ????? ????? ??? ??? ???????? ??? ?????? ????? ????? ?? ??? ??? ????? ???? 6  ???? ??? ??? ???? ???. ???? ???????? ????? ??? ????? ????? ????? ??? ?????? ????? 6 ???? ??? ?????? ???????. ???? ????? ?????? ??????? ?? ?????? ??? ?????? ??????? ??. ??? ???? ????? ????? ??? ?????? ?????? ?????? ???? ?????? ???? ?????? ???? ??? ?????? ????? 6 ???? ??? ?????? ???????. ?? ????? ???? ????? ?????? ????? ????? ??? ??????. ?? ???? ??? ?????? ?????. ???? ??? ???? ?????? ??? ??? ????? ???? ??????. ?? ?? ?????? ???????? ???? ???? ?? ??????? ??? ???? ??? ???????  ?????? ???????? ???? ??? ?? ???? ???? ?????? ??? ?? ???? ??? ????:  ??????? ????????--????? ??????? ?????? ??????????? (?????)? ???? ????? ?? ?????? ?? ?????? ?? ?????  ??? ?????--??? ??????? ???? ???????? ?? ??????? ?? ??????? ????? ????? ????????? ????? ?? ????? ??? ???????.  ??????--?????? ?????????? ??????? ?????? ?????? ?????? ???? ?? ?????? ????? ??? ?????? ?? ????? ??????? ?????? ????? ???? ?????? ?? ??????  ??????? ??????? -- ??? ?????? ??? ?????? ?? ????? ??????? ?????? ???????? ?? ??????  ?????? ??? ????? ???? ???????--????? ?? ????? ??? ???????? ??????? ??????? ????? ??????  ????????? ???????? ??????? ?? ????? ??? ?????? ???????? ????? ?? ???? ??? ?????  ?????? ???????? ???? ?? ????? ????? ????? ???? (???? ???? ?????? ??? ?????? ?? ???? ?????):  ?????  ???????  ????? ??????  ???????  ???? ?? ??????? ?? ???? ?? ?????? ???? ???? ?? ?????  ??? ???? ????? ??? ?? ????? ??? ??????? ??? ??????? ?? ?? ??? ?? ?????? ???????? ????????. ???? ?????? ????????? ?????? ?? ?????? ????????. ????? ??????? ?? ?????? ???????? ?????? ??????? ???????? ????????? (FDA) ??? ????? ?.[id number]??? ??? ??? ???? ???????? ??????? ????? ??? ?????? ?? ?????? ?? ?????. ?? ??? ?????? ?? ??????. ??????: ??? ??????? ????? ?? ????: ?? ?? ???? ??? ???????? ?? ????????? ???????. ??? ???? ???? ?? ????? ?? ??? ??????? ????? ??? ?????? ?? ??????? ?? ???? ??????? ??????.  Cyclophosphamide Injection ?? ??? ??????? ????? ????????????? ??? ?????  ???????. ??? ???? ?? ???? ????? ??? ??????? ?????????. ???? ??????? ??? ?????? ?????? ????? ???? ???? ??????? ?????? ?? ??????? ??? ???? ???? ?????. ????? ???????? ???????? ????????:? Cyclophosphamide, Cytoxan, Neosar ?? ?? ??????? ???? ??? ?? ???? ??? ???? ??????? ?????? ??? ????? ??? ???????  ???????? ??? ????? ?? ??? ??? ?????? ??? ?? ??? ???????:  ????? ?????  ??? ?????? ????? ????? ?? ??? ???? ?????  ????  ?????? ?? ?????  ????? ?????  ?????? ??????? ????? ???? (????? ???? ???????? ?? ??????? ???????? ?? ????? ???? ???????)  ????? ?????  ?????? ?????? ?????? ????????  ????? ??????  ?? ??? ??? ???? ?? ?????? ???? ?????????????? ?? ????? ????? ?? ?????? ?? ?????? ?? ???? ?????  ????? ?? ?????? ????? ??????? ???????? ??? ??? ???? ??????? ??? ??????? ????? ??? ?????? ?? ??????. ??? ?????? ?????? ???? ?????? ?? ?????? ?? ?????. ???? ??? ???? ?????? ???? ??????? ??? ?????? ???????. ??? ???? ???? ???? ??? ???? ????? ???? ???. ?????? ??????? : ??? ?????? ??? ?????? ???? ????? ???? ?? ??? ??????? ???? ????? ?????? ?? ?????? ?? ???? ??????? ?????.<br />??????: ?????? ??? ?????? ?? ???? ??? ???. ?? ????? ????? ??? ?? ????? ??? ??????. ???? ?? ???? (????) ????? ???? ??? ?????? ????? ????????. ?? ????? ??? ????? ?????. ???? ????? ?????? ??? ??? ??? ???? ??? ???????? ??? ??? ????????. ?? ?? ??????? ???? ?? ?????? ?? ??? ???????  ??????????? ?  ?????????  ??????????  ??? ?????? ????????? ???? ????? ????? ??? ??????? ??????? ?? ?????? ?????  ????? ????? ??????? ??? ????? ??? ?????????? ??????????? ?????????  ???????????  ????? ?????  ?????????  ???????????  ??????? ???? ???? ???????  ???????????  ???????????  ?????????? ???????? ??? ??????? ?? ?? ??? ?? ????????? ????????. ?? ?????? ???? ??????? ?????? ????? ??? ??????? ?? ??????? ?? ??????? ???? ???? ?? ???????? ???????? ???? ????????. ?????? ????? ??? ??? ???? ?? ???? ?????? ?? ?????? ?????? ??? ???????. ?? ?????? ??? ??????? ??  ?????. ?? ???? ??? ???? ????? ??? ??????? ??? ??????? ??? ?????? ?? ????? ???? ???? ????? ????? ???? ???. ??? ??? ??? ????? ??? ????? ??? ?????? ???????? ???? ?? ???? ??? ??????? ??????? ??? ???? ??? ??????? ?????????. ???? ?? ?? ???? ??????. ????? ?? ???? ????? ??? ?? ??? ???? ?????? ?? ?? ????? ???? ?????? ???????. ?? ????? ??? ????? ???????? ?? ????? ?????? ???? ??????. ??? ?????? ?? ???? ?? ??? ?????? ?????. ????? ???? ?????? ??? ???? ?????? ?? ???????? ?? ?????? ?????? ?? ????? ???? ?? ????? ????? ?? ??????????. ?? ????? ???? ?????. ???? ???? ???????? ???????? ??????. ???? ????? ??????? ???? ????? ??? ????????? ?? ????????????? ?? ??????????? ?? ?????????? ?? ?????????? ?? ?? ????? ???? ?????? ???? ???. ??? ??????? ?? ???? ??????? ???????. ???? ??? ?????? ?? ??????? ???? ??????? ?? ??????? ??? ??????? ???? ?? ????? ????? ?? ???? ?????? ????. ???? ???? ?????? ???? ?????? ??? ?????? ??? ?????? ??? ????? ??? ??????. ???? ????? ?? ??????? ?????? ??? ?????????. ???? ??????? ??? ?? ????? ?????. ??? ???????? ?? ????? ??? ??????. ???? ???? ?????? ???? ??? ??? ??? ?????? ????? ??? ??????. ???? ?? ????? ???? ??? ??????? ??? ?????? ??? ??????. ?? ???? ????? ?????? ??? ??????????? ??????????? ?? ??? ???? ??? ??????? ?? ??????. ?????? ?? ????? ????????? ???????? ????????? ??????? ???????? ?????? ?????. ?? ????? ?? ????? ?? 30 ????? ??? ??? ?????. ????? ??? ???? ?????? ??? ??? ?????? ?? ????? ?? ??????? ??? ????. ?? ???? ??? ?????? ?????? ????? ????? ??? ?? ?????? ????? ????? ?? ??? ??? ????? ???? ??? ???? ??? ??? ????. ??? ?? ???? ????? ?????? ????? ????? ??? ????? ?? ????? ??? ??????. ???? ???????? ????? ?????? ?? ????? ??? ????? ????? ????? ??? ?????? ????? ??? ???? ??? ??? ????. ????? ??? ???? ?????? ??? ????? ??? ????? ????????. ?? ???? ????? ????? ????? ??? ?????? ?????  4 ???? ??? ?????? ???????. ?????? ?????? ???? ??? ?????? ???????. ?? ????? ????? ?????? ????? ??????? ???? ?????? ?? ???? ????? ???? ??? ???  ???? ???. ?? ???? ??? ?????? ?????. ???? ??? ???? ?????? ??? ??? ????? ???? ??????. ???? ??? ???? ?????? ??? ??? ?????? ????????. ?? ???? ???? ???? ??????? ?????? ????? ?? ??????? ??? ??? ?????? ??? ??????. ?? ?? ?????? ???????? ???? ???? ?? ??????? ??? ???? ??? ???????  ?????? ???????? ???? ??? ?? ???? ???? ?????? ??? ?? ???? ??? ????:  ??????? ????????--????? ??????? ?????? ??????????? (?????)? ???? ????? ?? ?????? ?? ?????? ?? ?????  ?????? ?????? ?? ??? ??????? ?? ????? ??????  ??? ?????--??? ??????? ???? ???????? ?? ??????? ?? ??????? ????? ????? ????????? ????? ?? ????? ??? ???????.  ?????? ???? ?????--????? ?? ????? ??? ???????? ??? ??????? ????? ?? ?????? ????? ????? ??????? ?? ??? ????????? ??????? ???? ???????? ?? ??????? ?? ??????.  ???????? ?? ??? ?????--???? ????? ????? ?? ??? ?????? ????? ?????? ??????? ?????? ???????? ?? ??? ?????? ????? ?? ?????? ????? ??????  ??????--?????? ?????????? ??????? ?????? ?????? ?????? ???? ?? ?????? ????? ??? ?????? ?? ????? ??????? ?????? ????? ???? ?????? ?? ??????  ????? ?????--??? ???? ?????? ???? ???????? ?? ?????? ?? ???????  ????? ?????--????? ?? ????? ?????? ?????? ?? ?????? ????? ??????? ???????? ?????? ???? ?????? ????? ?????? ?????? ?? ?????? ?????? ????? ?? ???????? ????? ?? ????? ??? ???????  ?????? ??? ????? ???? ???????--????? ?? ????? ??? ???????? ??????? ??????? ????? ??????  ?????? ??????? ????????--??? ???????? ???????? ??????? ??????? ????????  ????? ?????? ?? ????? ?????? ????? ?? ???? ??? ?????  ?????? ???????? ???? ?? ????? ????? ????? ???? (???? ???? ?????? ??? ?????? ?? ???? ?????):  ????? ?????  ????? ????? ??? ?????? ?? ???? ????? ?? ??? ?????  ????? ??????  ???????  ???? ?? ??????? ?? ???? ?? ?????? ???? ???? ?? ????? ??? ??? ??????? ?? ?? ??? ?? ?????? ???????? ????????. ???? ?????? ????????? ?????? ?? ?????? ????????. ????? ??????? ?? ?????? ???????? ?????? ??????? ???????? ????????? (FDA) ??? ?????  [id number]??? ??? ??? ???? ???????? ??????? ????? ??? ?????? ?? ?????? ?? ?????. ?? ??? ?????? ?? ??????. ??????: ??? ??????? ????? ?? ????: ?? ?? ???? ??? ???????? ?? ????????? ???????. ??? ???? ???? ?? ????? ?? ??? ??????? ????? ??? ?????? ?? ??????? ?? ???? ??????? ??????.   2024 Elsevier/Gold Standard (2022-09-24 00:00:00)

## 2024-05-19 NOTE — Progress Notes (Signed)
 Patient Care Team: Center, [facility] as PCP - Diedre Rigg, Stephane BROCKS, RN (Inactive) as Oncology Nurse Navigator Tyree Nanetta SAILOR, RN as Oncology Nurse Navigator Odean Potts, MD as Consulting Physician (Hematology and Oncology) Aron Shoulders, MD as Consulting Physician (General Surgery) Izell Domino, MD as Attending Physician (Radiation Oncology)  DIAGNOSIS:  Encounter Diagnosis  Name Primary?   Malignant neoplasm of upper-outer quadrant of left breast in female, estrogen receptor positive (HCC) Yes    SUMMARY OF ONCOLOGIC HISTORY: Oncology History  Malignant neoplasm of upper-outer quadrant of left breast in female, estrogen receptor positive (HCC)  03/22/2024 Initial Diagnosis   Screening mammogram detected 2 masses in the left breast UOQ.  Mammogram measured 1.3 cm and 1.1 cm.  Contrast-enhanced mammogram was performed which revealed additional smaller masses (2.4 cm and 1.1 cm) spanning 7 x 3 cm.  Ultrasound measured the mass at 1.6 cm and 0.8 cm along with 2 enlarged axillary lymph nodes 1 of which was biopsy positive.  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   03/30/2024 Cancer Staging   Staging form: Breast, AJCC 8th Edition - Clinical: Stage IIA (cT2, cN1, cM0, G1, ER+, PR+, HER2-) - Signed by Odean Potts, MD on 03/30/2024 Stage prefix: Initial diagnosis Histologic grading system: 3 grade system   04/06/2024 -  Chemotherapy   Patient is on Treatment Plan : BREAST DOSE DENSE AC q14d / PACLitaxel q7d       CHIEF COMPLIANT: Cycle 4 Adriamycin  and Cytoxan   HISTORY OF PRESENT ILLNESS:   History of Present Illness : I reduce the dosage ofWafaa Garcia is a 48 year old female undergoing chemotherapy who presents with severe side effects from her recent treatment.  She experiences severe pain throughout her body, eyes, tongue, and stomach, along with vomiting without nausea following chemotherapy. She feels extremely weak, with difficulty  moving and breathing comfortably. Standing induces sensations of potential cardiac arrest, necessitating immediate sitting. These symptoms persist for seven to eight days post-treatment.  She feels significant emotional distress, expressing sadness and fear due to these symptoms. Concerns about future treatments and the impact of side effects contribute to her emotional state.  She requests a change in her chemotherapy schedule to accommodate her son's speech therapy sessions, highlighting the importance of her presence at these appointments.     ALLERGIES:  is allergic to pork-derived products.  MEDICATIONS:  Current Outpatient Medications  Medication Sig Dispense Refill   ALPRAZolam  (XANAX ) 0.25 MG tablet Take 1 tablet (0.25 mg total) by mouth at bedtime as needed for anxiety. 30 tablet 0   Cyanocobalamin (B-12 PO) Take 1 tablet by mouth once a week.     dexamethasone  (DECADRON ) 4 MG tablet Take 1 tablet day after chemo and 1 tablet 2 days after chemo with food 8 tablet 0   lidocaine -prilocaine  (EMLA ) cream Apply to affected area once (Patient not taking: Reported on 04/06/2024) 30 g 3   Omega-3 Fatty Acids (OMEGA 3 PO) Take 1 tablet by mouth once a week.     ondansetron  (ZOFRAN ) 8 MG tablet Take 1 tab (8 mg) by mouth every 8 hrs as needed for nausea/vomiting. Start third day after doxorubicin /cyclophosphamide  chemotherapy. (Patient not taking: Reported on 04/06/2024) 30 tablet 1   oxyCODONE  (OXY IR/ROXICODONE ) 5 MG immediate release tablet Take 1 tablet (5 mg total) by mouth every 6 (six) hours as needed for severe pain (pain score 7-10). 10 tablet 0   prochlorperazine  (COMPAZINE ) 10 MG tablet Take  1 tablet (10 mg total) by mouth every 6 (six) hours as needed for nausea or vomiting. 30 tablet 1   No current facility-administered medications for this visit.    PHYSICAL EXAMINATION: ECOG PERFORMANCE STATUS: 1 - Symptomatic but completely ambulatory  Vitals:   05/19/24 0836  BP: 122/72   Pulse: 80  Resp: 16  Temp: 98.5 F (36.9 C)  SpO2: 100%   Filed Weights   05/19/24 0836  Weight: 161 lb 9.6 oz (73.3 kg)     LABORATORY DATA:  I have reviewed the data as listed    Latest Ref Rng & Units 05/04/2024    8:49 AM 04/20/2024    7:48 AM 04/13/2024    9:20 AM  CMP  Glucose 70 - 99 mg/dL 839  859  844   BUN 6 - 20 mg/dL 9  7  8    Creatinine 0.44 - 1.00 mg/dL 9.42  9.45  9.51   Sodium 135 - 145 mmol/L 139  140  137   Potassium 3.5 - 5.1 mmol/L 3.6  3.5  3.5   Chloride 98 - 111 mmol/L 107  107  105   CO2 22 - 32 mmol/L 25  25  26    Calcium 8.9 - 10.3 mg/dL 9.1  8.9  9.0   Total Protein 6.5 - 8.1 g/dL 7.2  7.1  7.1   Total Bilirubin 0.0 - 1.2 mg/dL 0.2  0.2  0.3   Alkaline Phos 38 - 126 U/L 101  84  91   AST 15 - 41 U/L 15  14  12    ALT 0 - 44 U/L 19  26  26      Lab Results  Component Value Date   WBC 9.7 05/19/2024   HGB 11.4 (L) 05/19/2024   HCT 35.0 (L) 05/19/2024   MCV 76.1 (L) 05/19/2024   PLT 299 05/19/2024   NEUTROABS 6.2 05/19/2024    ASSESSMENT & PLAN:  Malignant neoplasm of upper-outer quadrant of left breast in female, estrogen receptor positive (HCC) 03/22/2024:Screening mammogram detected 2 masses in the left breast UOQ.  Mammogram measured 1.3 cm and 1.1 cm.  Contrast-enhanced mammogram was performed which revealed additional smaller masses (2.4 cm and 1.1 cm) spanning 7 x 3 cm.  Ultrasound measured the mass at 1.6 cm and 0.8 cm along with 2 enlarged axillary lymph nodes 1 of which was biopsy positive.  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    Treatment plan Neoadjuvant chemotherapy with dose dense Adriamycin  and Cytoxan  every 2 weeks x 4 followed by Taxol weekly x 12 Breast conserving surgery with sentinel lymph node biopsy and targeted node dissection Adjuvant radiation therapy Adjuvant antiestrogen therapy CT CAP 04/06/2024: Left breast mass, left axillary lymph nodes, right hepatic lesion Liver MRI 04/11/2024: Liver  hemangioma: Benign ------------------------------------------------------------------------------------------------------- Current Treatment: Cycle 4 dose dense Adriamycin  and Cytoxan  ECHO EF 60-65%  Chemo Toxicities: Nausea and Vomiting Insomnia Cough Severe fatigue: Reduce the dosage for fourth cycle of chemotherapy to make it more tolerable Anemia secondary to chemotherapy  RTC in 2 weeks for cycle 1 Taxol ------------------------------------- Assessment and Plan Assessment & Plan Malignant neoplasm of left breast, estrogen receptor positive Undergoing fourth cycle of chemotherapy with severe side effects. Blood counts stable. - Reduce chemotherapy dosage to lower range. - Transition to Taxol in two weeks. - Schedule chemotherapy on Wednesdays.  Chemotherapy-induced nausea Nausea contributing to discomfort and fatigue.  Chemotherapy-induced fatigue Significant fatigue due to chemotherapy.  Chemotherapy-induced  bone pain Generalized body pain consistent with treatment side effects.  Constipation Side effect of treatment regimen.      No orders of the defined types were placed in this encounter.  The patient has a good understanding of the overall plan. she agrees with it. she will call with any problems that may develop before the next visit here. Total time spent: 30 mins including face to face time and time spent for planning, charting and co-ordination of care   Viinay K Duchess Armendarez, MD 05/19/24

## 2024-05-21 ENCOUNTER — Inpatient Hospital Stay

## 2024-05-21 VITALS — BP 129/65 | HR 84 | Temp 97.7°F | Resp 14

## 2024-05-21 DIAGNOSIS — Z170 Estrogen receptor positive status [ER+]: Secondary | ICD-10-CM

## 2024-05-21 DIAGNOSIS — Z5111 Encounter for antineoplastic chemotherapy: Secondary | ICD-10-CM | POA: Diagnosis not present

## 2024-05-21 MED ORDER — PEGFILGRASTIM-CBQV 6 MG/0.6ML ~~LOC~~ SOSY
6.0000 mg | PREFILLED_SYRINGE | Freq: Once | SUBCUTANEOUS | Status: AC
  Administered 2024-05-21: 6 mg via SUBCUTANEOUS
  Filled 2024-05-21: qty 0.6

## 2024-05-23 ENCOUNTER — Encounter: Payer: Self-pay | Admitting: *Deleted

## 2024-05-23 ENCOUNTER — Ambulatory Visit

## 2024-05-30 ENCOUNTER — Other Ambulatory Visit: Payer: Self-pay

## 2024-06-01 NOTE — Assessment & Plan Note (Signed)
 03/22/2024:Screening mammogram detected 2 masses in the left breast UOQ.  Mammogram measured 1.3 cm and 1.1 cm.  Contrast-enhanced mammogram was performed which revealed additional smaller masses (2.4 cm and 1.1 cm) spanning 7 x 3 cm.  Ultrasound measured the mass at 1.6 cm and 0.8 cm along with 2 enlarged axillary lymph nodes 1 of which was biopsy positive.  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    Treatment plan Neoadjuvant chemotherapy with dose dense Adriamycin  and Cytoxan  every 2 weeks x 4 followed by Taxol  weekly x 12 Breast conserving surgery with sentinel lymph node biopsy and targeted node dissection Adjuvant radiation therapy Adjuvant antiestrogen therapy CT CAP 04/06/2024: Left breast mass, left axillary lymph nodes, right hepatic lesion Liver MRI 04/11/2024: Liver hemangioma: Benign ------------------------------------------------------------------------------------------------------- Current Treatment: Completed 4 cycles of dose dense Adriamycin  and Cytoxan , today is cycle 1 Taxol  ECHO EF 60-65%   Chemo Toxicities: Nausea and Vomiting Insomnia Cough Severe fatigue: Reduce the dosage for fourth cycle of chemotherapy to make it more tolerable Anemia secondary to chemotherapy   RTC weekly for chemo

## 2024-06-02 ENCOUNTER — Inpatient Hospital Stay

## 2024-06-02 ENCOUNTER — Inpatient Hospital Stay (HOSPITAL_BASED_OUTPATIENT_CLINIC_OR_DEPARTMENT_OTHER): Admitting: Hematology and Oncology

## 2024-06-02 VITALS — BP 136/88 | HR 82 | Resp 16

## 2024-06-02 VITALS — BP 117/76 | HR 95 | Temp 97.4°F | Resp 18 | Ht 60.0 in | Wt 163.0 lb

## 2024-06-02 DIAGNOSIS — Z170 Estrogen receptor positive status [ER+]: Secondary | ICD-10-CM

## 2024-06-02 DIAGNOSIS — C50412 Malignant neoplasm of upper-outer quadrant of left female breast: Secondary | ICD-10-CM

## 2024-06-02 DIAGNOSIS — Z5111 Encounter for antineoplastic chemotherapy: Secondary | ICD-10-CM | POA: Diagnosis not present

## 2024-06-02 DIAGNOSIS — Z95828 Presence of other vascular implants and grafts: Secondary | ICD-10-CM

## 2024-06-02 LAB — CMP (CANCER CENTER ONLY)
ALT: 25 U/L (ref 0–44)
AST: 15 U/L (ref 15–41)
Albumin: 4.1 g/dL (ref 3.5–5.0)
Alkaline Phosphatase: 123 U/L (ref 38–126)
Anion gap: 6 (ref 5–15)
BUN: 11 mg/dL (ref 6–20)
CO2: 27 mmol/L (ref 22–32)
Calcium: 9 mg/dL (ref 8.9–10.3)
Chloride: 107 mmol/L (ref 98–111)
Creatinine: 0.53 mg/dL (ref 0.44–1.00)
GFR, Estimated: >60 mL/min (ref >60)
Glucose, Bld: 111 mg/dL — ABNORMAL HIGH (ref 70–99)
Potassium: 4 mmol/L (ref 3.5–5.1)
Sodium: 140 mmol/L (ref 135–145)
Total Bilirubin: 0.2 mg/dL (ref 0.0–1.2)
Total Protein: 6.9 g/dL (ref 6.5–8.1)

## 2024-06-02 LAB — CBC WITH DIFFERENTIAL (CANCER CENTER ONLY)
Abs Immature Granulocytes: 0.92 K/uL — ABNORMAL HIGH (ref 0.00–0.07)
Basophils Absolute: 0.2 K/uL — ABNORMAL HIGH (ref 0.0–0.1)
Basophils Relative: 1 %
Eosinophils Absolute: 0.5 K/uL (ref 0.0–0.5)
Eosinophils Relative: 4 %
HCT: 33.8 % — ABNORMAL LOW (ref 36.0–46.0)
Hemoglobin: 11 g/dL — ABNORMAL LOW (ref 12.0–15.0)
Immature Granulocytes: 7 %
Lymphocytes Relative: 12 %
Lymphs Abs: 1.7 K/uL (ref 0.7–4.0)
MCH: 24.9 pg — ABNORMAL LOW (ref 26.0–34.0)
MCHC: 32.5 g/dL (ref 30.0–36.0)
MCV: 76.5 fL — ABNORMAL LOW (ref 80.0–100.0)
Monocytes Absolute: 1 K/uL (ref 0.1–1.0)
Monocytes Relative: 7 %
Neutro Abs: 10 K/uL — ABNORMAL HIGH (ref 1.7–7.7)
Neutrophils Relative %: 69 %
Platelet Count: 259 K/uL (ref 150–400)
RBC: 4.42 MIL/uL (ref 3.87–5.11)
RDW: 18.4 % — ABNORMAL HIGH (ref 11.5–15.5)
WBC Count: 14.3 K/uL — ABNORMAL HIGH (ref 4.0–10.5)
nRBC: 0 % (ref 0.0–0.2)

## 2024-06-02 MED ORDER — SODIUM CHLORIDE 0.9% FLUSH
10.0000 mL | Freq: Once | INTRAVENOUS | Status: AC
  Administered 2024-06-02: 10 mL

## 2024-06-02 MED ORDER — FAMOTIDINE IN NACL 20-0.9 MG/50ML-% IV SOLN
20.0000 mg | Freq: Once | INTRAVENOUS | Status: AC
  Administered 2024-06-02: 20 mg via INTRAVENOUS
  Filled 2024-06-02: qty 50

## 2024-06-02 MED ORDER — SODIUM CHLORIDE 0.9 % IV SOLN: INTRAVENOUS | Status: DC

## 2024-06-02 MED ORDER — DIPHENHYDRAMINE HCL 50 MG/ML IJ SOLN
50.0000 mg | Freq: Once | INTRAMUSCULAR | Status: AC
  Administered 2024-06-02: 50 mg via INTRAVENOUS
  Filled 2024-06-02: qty 1

## 2024-06-02 MED ORDER — DEXAMETHASONE SODIUM PHOSPHATE 10 MG/ML IJ SOLN
10.0000 mg | Freq: Once | INTRAMUSCULAR | Status: AC
  Administered 2024-06-02: 10 mg via INTRAVENOUS
  Filled 2024-06-02: qty 1

## 2024-06-02 MED ORDER — SODIUM CHLORIDE 0.9 % IV SOLN
80.0000 mg/m2 | Freq: Once | INTRAVENOUS | Status: AC
  Administered 2024-06-02: 144 mg via INTRAVENOUS
  Filled 2024-06-02: qty 24

## 2024-06-02 NOTE — Patient Instructions (Signed)

## 2024-06-02 NOTE — Progress Notes (Signed)
 Patient Care Team: [facility], [facility] as PCP - Diedre Rigg, Stephane BROCKS, RN (Inactive) as Oncology Nurse Navigator Tyree Nanetta SAILOR, RN as Oncology Nurse Navigator Odean Potts, MD as Consulting Physician (Hematology and Oncology) Aron Shoulders, MD as Consulting Physician (General Surgery) Izell Domino, MD as Attending Physician (Radiation Oncology)  DIAGNOSIS:  Encounter Diagnosis  Name Primary?   Malignant neoplasm of upper-outer quadrant of left breast in female, estrogen receptor positive (HCC) Yes    SUMMARY OF ONCOLOGIC HISTORY: Oncology History  Malignant neoplasm of upper-outer quadrant of left breast in female, estrogen receptor positive (HCC)  03/22/2024 Initial Diagnosis   Screening mammogram detected 2 masses in the left breast UOQ.  Mammogram measured 1.3 cm and 1.1 cm.  Contrast-enhanced mammogram was performed which revealed additional smaller masses (2.4 cm and 1.1 cm) spanning 7 x 3 cm.  Ultrasound measured the mass at 1.6 cm and 0.8 cm along with 2 enlarged axillary lymph nodes 1 of which was biopsy positive.  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   03/30/2024 Cancer Staging   Staging form: Breast, AJCC 8th Edition - Clinical: Stage IIA (cT2, cN1, cM0, G1, ER+, PR+, HER2-) - Signed by Odean Potts, MD on 03/30/2024 Stage prefix: Initial diagnosis Histologic grading system: 3 grade system   04/06/2024 -  Chemotherapy   Patient is on Treatment Plan : BREAST DOSE DENSE AC q14d / PACLitaxel  q7d       CHIEF COMPLIANT: Cycle 1 Taxol   HISTORY OF PRESENT ILLNESS: Ms. Wanda Garcia is a 48 year old who is currently receiving neoadjuvant chemotherapy and today cycle 49 of Taxol .  After the last cycle of Adriamycin  and Cytoxan  she felt nauseated but did not throw up.  She feels tired but overall in good spirits.    ALLERGIES:  is allergic to pork-derived products.  MEDICATIONS:  Current Outpatient Medications  Medication Sig Dispense Refill    Cyanocobalamin (B-12 PO) Take 1 tablet by mouth once a week.     dexamethasone  (DECADRON ) 4 MG tablet Take 1 tablet day after chemo and 1 tablet 2 days after chemo with food 8 tablet 0   lidocaine -prilocaine  (EMLA ) cream Apply to affected area once (Patient not taking: Reported on 06/02/2024) 30 g 3   Omega-3 Fatty Acids (OMEGA 3 PO) Take 1 tablet by mouth once a week.     ondansetron  (ZOFRAN ) 8 MG tablet Take 1 tab (8 mg) by mouth every 8 hrs as needed for nausea/vomiting. Start third day after doxorubicin /cyclophosphamide  chemotherapy. (Patient not taking: Reported on 04/06/2024) 30 tablet 1   oxyCODONE  (OXY IR/ROXICODONE ) 5 MG immediate release tablet Take 1 tablet (5 mg total) by mouth every 6 (six) hours as needed for severe pain (pain score 7-10). 10 tablet 0   prochlorperazine  (COMPAZINE ) 10 MG tablet Take 1 tablet (10 mg total) by mouth every 6 (six) hours as needed for nausea or vomiting. 30 tablet 1   No current facility-administered medications for this visit.    PHYSICAL EXAMINATION: ECOG PERFORMANCE STATUS: 1 - Symptomatic but completely ambulatory  Vitals:   06/02/24 0815  BP: 117/76  Pulse: 95  Resp: 18  Temp: (!) 97.4 F (36.3 C)  SpO2: 100%   Filed Weights   06/02/24 0815  Weight: 163 lb (73.9 kg)      LABORATORY DATA:  I have reviewed the data as listed    Latest Ref Rng & Units 05/19/2024    8:13 AM 05/04/2024    8:49  AM 04/20/2024    7:48 AM  CMP  Glucose 70 - 99 mg/dL 886  839  859   BUN 6 - 20 mg/dL 11  9  7    Creatinine 0.44 - 1.00 mg/dL 9.45  9.42  9.45   Sodium 135 - 145 mmol/L 140  139  140   Potassium 3.5 - 5.1 mmol/L 3.8  3.6  3.5   Chloride 98 - 111 mmol/L 107  107  107   CO2 22 - 32 mmol/L 26  25  25    Calcium 8.9 - 10.3 mg/dL 9.2  9.1  8.9   Total Protein 6.5 - 8.1 g/dL 6.9  7.2  7.1   Total Bilirubin 0.0 - 1.2 mg/dL 0.2  0.2  0.2   Alkaline Phos 38 - 126 U/L 92  101  84   AST 15 - 41 U/L 15  15  14    ALT 0 - 44 U/L 22  19  26      Lab  Results  Component Value Date   WBC 9.7 05/19/2024   HGB 11.4 (L) 05/19/2024   HCT 35.0 (L) 05/19/2024   MCV 76.1 (L) 05/19/2024   PLT 299 05/19/2024   NEUTROABS 6.2 05/19/2024    ASSESSMENT & PLAN:  Malignant neoplasm of upper-outer quadrant of left breast in female, estrogen receptor positive (HCC) 03/22/2024:Screening mammogram detected 2 masses in the left breast UOQ.  Mammogram measured 1.3 cm and 1.1 cm.  Contrast-enhanced mammogram was performed which revealed additional smaller masses (2.4 cm and 1.1 cm) spanning 7 x 3 cm.  Ultrasound measured the mass at 1.6 cm and 0.8 cm along with 2 enlarged axillary lymph nodes 1 of which was biopsy positive.  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    Treatment plan Neoadjuvant chemotherapy with dose dense Adriamycin  and Cytoxan  every 2 weeks x 4 followed by Taxol  weekly x 12 Breast conserving surgery with sentinel lymph node biopsy and targeted node dissection Adjuvant radiation therapy Adjuvant antiestrogen therapy CT CAP 04/06/2024: Left breast mass, left axillary lymph nodes, right hepatic lesion Liver MRI 04/11/2024: Liver hemangioma: Benign ------------------------------------------------------------------------------------------------------- Current Treatment: Completed 4 cycles of dose dense Adriamycin  and Cytoxan , today is cycle 1 Taxol  ECHO EF 60-65%   Chemo Toxicities: Nausea and Vomiting Insomnia Cough Severe fatigue: Reduce the dosage for fourth cycle of chemotherapy to make it more tolerable Anemia secondary to chemotherapy: Monitoring closely today's hemoglobin is 11   RTC weekly for chemo     No orders of the defined types were placed in this encounter.  The patient has a good understanding of the overall plan. she agrees with it. she will call with any problems that may develop before the next visit here. Total time spent: 30 mins including face to face time and time spent for planning, charting  and co-ordination of care   Viinay K Azizi Bally, MD 06/02/24

## 2024-06-08 ENCOUNTER — Encounter: Payer: Self-pay | Admitting: Licensed Clinical Social Worker

## 2024-06-08 NOTE — Assessment & Plan Note (Signed)
 03/22/2024:Screening mammogram detected 2 masses in the left breast UOQ.  Mammogram measured 1.3 cm and 1.1 cm.  Contrast-enhanced mammogram was performed which revealed additional smaller masses (2.4 cm and 1.1 cm) spanning 7 x 3 cm.  Ultrasound measured the mass at 1.6 cm and 0.8 cm along with 2 enlarged axillary lymph nodes 1 of which was biopsy positive.  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    Treatment plan Neoadjuvant chemotherapy with dose dense Adriamycin  and Cytoxan  every 2 weeks x 4 followed by Taxol  weekly x 12 Breast conserving surgery with sentinel lymph node biopsy and targeted node dissection Adjuvant radiation therapy Adjuvant antiestrogen therapy CT CAP 04/06/2024: Left breast mass, left axillary lymph nodes, right hepatic lesion Liver MRI 04/11/2024: Liver hemangioma: Benign ------------------------------------------------------------------------------------------------------- Current Treatment: Completed 4 cycles of dose dense Adriamycin  and Cytoxan , today is cycle 2 Taxol  ECHO EF 60-65%   Chemo Toxicities: Nausea and Vomiting Insomnia Cough Severe fatigue: Reduce the dosage for fourth cycle of chemotherapy to make it more tolerable Anemia secondary to chemotherapy: Monitoring closely today's hemoglobin is 11   RTC weekly for chemo

## 2024-06-08 NOTE — Progress Notes (Signed)
 [facility] CSW Progress Note  Clinical Child psychotherapist contacted caregiver by phone to follow-up on need for community resources per VM from pt's husband.  Pt's husband is still working, but with increased needs, finances are becoming tight. Pt has been approved for & receiving [employer].    Interventions: Discussed resources through cancer foundations and what is needed to apply      Follow Up Plan:  Pt's spouse will send a bill to CSW to apply to [employer] grant    Alejos Reinhardt E HELANE, LCSW Clinical Social Worker [facility]    Patient is participating in a Managed Medicaid Plan:  Yes

## 2024-06-09 ENCOUNTER — Inpatient Hospital Stay

## 2024-06-09 ENCOUNTER — Inpatient Hospital Stay (HOSPITAL_BASED_OUTPATIENT_CLINIC_OR_DEPARTMENT_OTHER): Admitting: Hematology and Oncology

## 2024-06-09 ENCOUNTER — Encounter: Payer: Self-pay | Admitting: *Deleted

## 2024-06-09 VITALS — BP 132/68 | HR 99 | Temp 98.2°F | Resp 18 | Ht 60.0 in | Wt 162.9 lb

## 2024-06-09 DIAGNOSIS — Z170 Estrogen receptor positive status [ER+]: Secondary | ICD-10-CM | POA: Diagnosis not present

## 2024-06-09 DIAGNOSIS — Z95828 Presence of other vascular implants and grafts: Secondary | ICD-10-CM

## 2024-06-09 DIAGNOSIS — C50412 Malignant neoplasm of upper-outer quadrant of left female breast: Secondary | ICD-10-CM

## 2024-06-09 DIAGNOSIS — Z5111 Encounter for antineoplastic chemotherapy: Secondary | ICD-10-CM | POA: Diagnosis not present

## 2024-06-09 LAB — CMP (CANCER CENTER ONLY)
ALT: 77 U/L — ABNORMAL HIGH (ref 0–44)
AST: 36 U/L (ref 15–41)
Albumin: 4.1 g/dL (ref 3.5–5.0)
Alkaline Phosphatase: 75 U/L (ref 38–126)
Anion gap: 5 (ref 5–15)
BUN: 12 mg/dL (ref 6–20)
CO2: 28 mmol/L (ref 22–32)
Calcium: 9.2 mg/dL (ref 8.9–10.3)
Chloride: 106 mmol/L (ref 98–111)
Creatinine: 0.57 mg/dL (ref 0.44–1.00)
GFR, Estimated: >60 mL/min (ref >60)
Glucose, Bld: 132 mg/dL — ABNORMAL HIGH (ref 70–99)
Potassium: 3.9 mmol/L (ref 3.5–5.1)
Sodium: 139 mmol/L (ref 135–145)
Total Bilirubin: 0.2 mg/dL (ref 0.0–1.2)
Total Protein: 7 g/dL (ref 6.5–8.1)

## 2024-06-09 LAB — CBC WITH DIFFERENTIAL (CANCER CENTER ONLY)
Abs Immature Granulocytes: 0.02 K/uL (ref 0.00–0.07)
Basophils Absolute: 0.1 K/uL (ref 0.0–0.1)
Basophils Relative: 2 %
Eosinophils Absolute: 0.3 K/uL (ref 0.0–0.5)
Eosinophils Relative: 6 %
HCT: 32.9 % — ABNORMAL LOW (ref 36.0–46.0)
Hemoglobin: 10.6 g/dL — ABNORMAL LOW (ref 12.0–15.0)
Immature Granulocytes: 0 %
Lymphocytes Relative: 22 %
Lymphs Abs: 1 K/uL (ref 0.7–4.0)
MCH: 24.9 pg — ABNORMAL LOW (ref 26.0–34.0)
MCHC: 32.2 g/dL (ref 30.0–36.0)
MCV: 77.2 fL — ABNORMAL LOW (ref 80.0–100.0)
Monocytes Absolute: 0.4 K/uL (ref 0.1–1.0)
Monocytes Relative: 9 %
Neutro Abs: 2.8 K/uL (ref 1.7–7.7)
Neutrophils Relative %: 61 %
Platelet Count: 425 K/uL — ABNORMAL HIGH (ref 150–400)
RBC: 4.26 MIL/uL (ref 3.87–5.11)
RDW: 18.5 % — ABNORMAL HIGH (ref 11.5–15.5)
WBC Count: 4.6 K/uL (ref 4.0–10.5)
nRBC: 0 % (ref 0.0–0.2)

## 2024-06-09 LAB — PREGNANCY, URINE: Preg Test, Ur: NEGATIVE

## 2024-06-09 MED ORDER — SODIUM CHLORIDE 0.9 % IV SOLN
INTRAVENOUS | Status: DC
Start: 2024-06-09 — End: 2024-06-09

## 2024-06-09 MED ORDER — FAMOTIDINE IN NACL 20-0.9 MG/50ML-% IV SOLN
20.0000 mg | Freq: Once | INTRAVENOUS | Status: AC
  Administered 2024-06-09: 20 mg via INTRAVENOUS
  Filled 2024-06-09: qty 50

## 2024-06-09 MED ORDER — DIPHENHYDRAMINE HCL 50 MG/ML IJ SOLN
50.0000 mg | Freq: Once | INTRAMUSCULAR | Status: AC
  Administered 2024-06-09: 50 mg via INTRAVENOUS
  Filled 2024-06-09: qty 1

## 2024-06-09 MED ORDER — SODIUM CHLORIDE 0.9% FLUSH
10.0000 mL | INTRAVENOUS | Status: DC | PRN
  Administered 2024-06-09: 10 mL

## 2024-06-09 MED ORDER — DEXAMETHASONE SODIUM PHOSPHATE 10 MG/ML IJ SOLN
10.0000 mg | Freq: Once | INTRAMUSCULAR | Status: AC
  Administered 2024-06-09: 10 mg via INTRAVENOUS
  Filled 2024-06-09: qty 1

## 2024-06-09 MED ORDER — SODIUM CHLORIDE 0.9 % IV SOLN
65.0000 mg/m2 | Freq: Once | INTRAVENOUS | Status: AC
  Administered 2024-06-09: 114 mg via INTRAVENOUS
  Filled 2024-06-09: qty 19

## 2024-06-09 MED ORDER — SODIUM CHLORIDE 0.9% FLUSH
10.0000 mL | Freq: Once | INTRAVENOUS | Status: AC
Start: 2024-06-09 — End: 2024-06-09
  Administered 2024-06-09: 10 mL

## 2024-06-09 NOTE — Patient Instructions (Signed)

## 2024-06-09 NOTE — Progress Notes (Signed)
 Patient Care Team: [facility] as PCP - Diedre Rigg, Stephane BROCKS, RN (Inactive) as Oncology Nurse Navigator Tyree Nanetta SAILOR, RN as Oncology Nurse Navigator Odean Potts, MD as Consulting Physician (Hematology and Oncology) Aron Shoulders, MD as Consulting Physician (General Surgery) Izell Domino, MD as Attending Physician (Radiation Oncology)  DIAGNOSIS:  Encounter Diagnosis  Name Primary?   Malignant neoplasm of upper-outer quadrant of left breast in female, estrogen receptor positive (HCC) Yes    SUMMARY OF ONCOLOGIC HISTORY: Oncology History  Malignant neoplasm of upper-outer quadrant of left breast in female, estrogen receptor positive (HCC)  03/22/2024 Initial Diagnosis   Screening mammogram detected 2 masses in the left breast UOQ.  Mammogram measured 1.3 cm and 1.1 cm.  Contrast-enhanced mammogram was performed which revealed additional smaller masses (2.4 cm and 1.1 cm) spanning 7 x 3 cm.  Ultrasound measured the mass at 1.6 cm and 0.8 cm along with 2 enlarged axillary lymph nodes 1 of which was biopsy positive.  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   03/30/2024 Cancer Staging   Staging form: Breast, AJCC 8th Edition - Clinical: Stage IIA (cT2, cN1, cM0, G1, ER+, PR+, HER2-) - Signed by Odean Potts, MD on 03/30/2024 Stage prefix: Initial diagnosis Histologic grading system: 3 grade system   04/06/2024 -  Chemotherapy   Patient is on Treatment Plan : BREAST DOSE DENSE AC q14d / PACLitaxel  q7d       CHIEF COMPLIANT: Cycle 2 Taxol   HISTORY OF PRESENT ILLNESS:   History of Present Illness Wanda Garcia is a 48 year old female undergoing chemotherapy who presents with numbness and nausea.  Nausea is significantly better since she started Taxol ..  She has numbness and heaviness in her body, particularly in her hands and feet, with more pronounced symptoms in her hands. This sensation began during her initial chemotherapy regimen  with 'adriamycin ' and has worsened slightly with the current treatment. She has no significant difficulty with daily activities such as writing or holding objects.  She occasionally experiences thigh pain, which she associates with inadequate hydration, particularly at night.     ALLERGIES:  is allergic to pork-derived products.  MEDICATIONS:  Current Outpatient Medications  Medication Sig Dispense Refill   Cyanocobalamin (B-12 PO) Take 1 tablet by mouth once a week.     oxyCODONE  (OXY IR/ROXICODONE ) 5 MG immediate release tablet Take 1 tablet (5 mg total) by mouth every 6 (six) hours as needed for severe pain (pain score 7-10). 10 tablet 0   prochlorperazine  (COMPAZINE ) 10 MG tablet Take 1 tablet (10 mg total) by mouth every 6 (six) hours as needed for nausea or vomiting. 30 tablet 1   ALPRAZolam  (XANAX ) 0.25 MG tablet Take 0.25 mg by mouth at bedtime as needed for anxiety.     lidocaine -prilocaine  (EMLA ) cream Apply to affected area once (Patient not taking: Reported on 06/09/2024) 30 g 3   Omega-3 Fatty Acids (OMEGA 3 PO) Take 1 tablet by mouth once a week. (Patient not taking: Reported on 06/09/2024)     ondansetron  (ZOFRAN ) 8 MG tablet Take 1 tab (8 mg) by mouth every 8 hrs as needed for nausea/vomiting. Start third day after doxorubicin /cyclophosphamide  chemotherapy. (Patient not taking: Reported on 06/09/2024) 30 tablet 1   No current facility-administered medications for this visit.    PHYSICAL EXAMINATION: ECOG PERFORMANCE STATUS: 1 - Symptomatic but completely ambulatory  Vitals:   06/09/24 0931  BP: 132/68  Pulse: 99  Resp: 18  Temp: 98.2 F (36.8 C)  SpO2: 100%   Filed Weights   06/09/24 0931  Weight: 162 lb 14.4 oz (73.9 kg)    Physical Exam Peripheral neuropathy  (exam performed in the presence of a chaperone)  LABORATORY DATA:  I have reviewed the data as listed    Latest Ref Rng & Units 06/02/2024    7:59 AM 05/19/2024    8:13 AM 05/04/2024    8:49 AM  CMP   Glucose 70 - 99 mg/dL 888  886  839   BUN 6 - 20 mg/dL 11  11  9    Creatinine 0.44 - 1.00 mg/dL 9.46  9.45  9.42   Sodium 135 - 145 mmol/L 140  140  139   Potassium 3.5 - 5.1 mmol/L 4.0  3.8  3.6   Chloride 98 - 111 mmol/L 107  107  107   CO2 22 - 32 mmol/L 27  26  25    Calcium 8.9 - 10.3 mg/dL 9.0  9.2  9.1   Total Protein 6.5 - 8.1 g/dL 6.9  6.9  7.2   Total Bilirubin 0.0 - 1.2 mg/dL 0.2  0.2  0.2   Alkaline Phos 38 - 126 U/L 123  92  101   AST 15 - 41 U/L 15  15  15    ALT 0 - 44 U/L 25  22  19      Lab Results  Component Value Date   WBC 4.6 06/09/2024   HGB 10.6 (L) 06/09/2024   HCT 32.9 (L) 06/09/2024   MCV 77.2 (L) 06/09/2024   PLT 425 (H) 06/09/2024   NEUTROABS 2.8 06/09/2024    ASSESSMENT & PLAN:  Malignant neoplasm of upper-outer quadrant of left breast in female, estrogen receptor positive (HCC) 03/22/2024:Screening mammogram detected 2 masses in the left breast UOQ.  Mammogram measured 1.3 cm and 1.1 cm.  Contrast-enhanced mammogram was performed which revealed additional smaller masses (2.4 cm and 1.1 cm) spanning 7 x 3 cm.  Ultrasound measured the mass at 1.6 cm and 0.8 cm along with 2 enlarged axillary lymph nodes 1 of which was biopsy positive.  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    Treatment plan Neoadjuvant chemotherapy with dose dense Adriamycin  and Cytoxan  every 2 weeks x 4 followed by Taxol  weekly x 12 Breast conserving surgery with sentinel lymph node biopsy and targeted node dissection Adjuvant radiation therapy Adjuvant antiestrogen therapy CT CAP 04/06/2024: Left breast mass, left axillary lymph nodes, right hepatic lesion Liver MRI 04/11/2024: Liver hemangioma: Benign ------------------------------------------------------------------------------------------------------- Current Treatment: Completed 4 cycles of dose dense Adriamycin  and Cytoxan , today is cycle 2 Taxol  ECHO EF 60-65%   Chemo Toxicities: Nausea and  Vomiting Insomnia Cough Severe fatigue: Reduce the dosage for fourth cycle of chemotherapy to make it more tolerable Anemia secondary to chemotherapy: Monitoring closely today's hemoglobin is 10.6 Peripheral 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  We will reduce the dosage of Taxol  today.  She would also like to move her treatments to Wednesdays.   RTC weekly for chemo    ------------------------------------- Assessment and Plan Assessment & Plan Chemotherapy-induced peripheral neuropathy Neuropathy symptoms, particularly in hands, worsened with current Taxol  treatment. Risk of chronic symptoms if dose maintained. - Lower chemotherapy dose to mitigate neuropathy. - Monitor for worsening symptoms, including fine motor difficulties. - Educated about potential for chronic symptoms if unmanaged.  Anemia due to chemotherapy Hemoglobin at 10.6, persistent anemia from initial chemotherapy. Expected improvement as recovery progresses. -  Monitor hemoglobin in upcoming tests. - Reassured improvement expected in weeks.      No orders of the defined types were placed in this encounter.  The patient has a good understanding of the overall plan. she agrees with it. she will call with any problems that may develop before the next visit here. Total time spent: 30 mins including face to face time and time spent for planning, charting and co-ordination of care   Naomi MARLA Chad, MD 06/09/24

## 2024-06-10 ENCOUNTER — Other Ambulatory Visit: Payer: Self-pay

## 2024-06-14 ENCOUNTER — Other Ambulatory Visit: Payer: Self-pay

## 2024-06-15 ENCOUNTER — Inpatient Hospital Stay (HOSPITAL_BASED_OUTPATIENT_CLINIC_OR_DEPARTMENT_OTHER): Admitting: Hematology and Oncology

## 2024-06-15 ENCOUNTER — Inpatient Hospital Stay

## 2024-06-15 ENCOUNTER — Telehealth: Payer: Self-pay

## 2024-06-15 VITALS — BP 130/76 | HR 88 | Resp 17

## 2024-06-15 VITALS — BP 118/70 | HR 70 | Temp 98.5°F | Resp 16 | Ht 60.0 in | Wt 163.4 lb

## 2024-06-15 DIAGNOSIS — C50412 Malignant neoplasm of upper-outer quadrant of left female breast: Secondary | ICD-10-CM | POA: Diagnosis not present

## 2024-06-15 DIAGNOSIS — D509 Iron deficiency anemia, unspecified: Secondary | ICD-10-CM

## 2024-06-15 DIAGNOSIS — Z170 Estrogen receptor positive status [ER+]: Secondary | ICD-10-CM

## 2024-06-15 DIAGNOSIS — Z5111 Encounter for antineoplastic chemotherapy: Secondary | ICD-10-CM | POA: Diagnosis not present

## 2024-06-15 DIAGNOSIS — Z95828 Presence of other vascular implants and grafts: Secondary | ICD-10-CM

## 2024-06-15 LAB — CMP (CANCER CENTER ONLY)
ALT: 76 U/L — ABNORMAL HIGH (ref 0–44)
AST: 25 U/L (ref 15–41)
Albumin: 3.9 g/dL (ref 3.5–5.0)
Alkaline Phosphatase: 69 U/L (ref 38–126)
Anion gap: 5 (ref 5–15)
BUN: 13 mg/dL (ref 6–20)
CO2: 26 mmol/L (ref 22–32)
Calcium: 8.8 mg/dL — ABNORMAL LOW (ref 8.9–10.3)
Chloride: 107 mmol/L (ref 98–111)
Creatinine: 0.51 mg/dL (ref 0.44–1.00)
GFR, Estimated: >60 mL/min (ref >60)
Glucose, Bld: 132 mg/dL — ABNORMAL HIGH (ref 70–99)
Potassium: 3.9 mmol/L (ref 3.5–5.1)
Sodium: 138 mmol/L (ref 135–145)
Total Bilirubin: 0.3 mg/dL (ref 0.0–1.2)
Total Protein: 6.7 g/dL (ref 6.5–8.1)

## 2024-06-15 LAB — CBC WITH DIFFERENTIAL (CANCER CENTER ONLY)
Abs Immature Granulocytes: 0.01 K/uL (ref 0.00–0.07)
Basophils Absolute: 0 K/uL (ref 0.0–0.1)
Basophils Relative: 1 %
Eosinophils Absolute: 0.3 K/uL (ref 0.0–0.5)
Eosinophils Relative: 6 %
HCT: 31.3 % — ABNORMAL LOW (ref 36.0–46.0)
Hemoglobin: 10.2 g/dL — ABNORMAL LOW (ref 12.0–15.0)
Immature Granulocytes: 0 %
Lymphocytes Relative: 25 %
Lymphs Abs: 1.2 K/uL (ref 0.7–4.0)
MCH: 25.6 pg — ABNORMAL LOW (ref 26.0–34.0)
MCHC: 32.6 g/dL (ref 30.0–36.0)
MCV: 78.4 fL — ABNORMAL LOW (ref 80.0–100.0)
Monocytes Absolute: 0.3 K/uL (ref 0.1–1.0)
Monocytes Relative: 7 %
Neutro Abs: 2.8 K/uL (ref 1.7–7.7)
Neutrophils Relative %: 61 %
Platelet Count: 381 K/uL (ref 150–400)
RBC: 3.99 MIL/uL (ref 3.87–5.11)
RDW: 18.6 % — ABNORMAL HIGH (ref 11.5–15.5)
WBC Count: 4.6 K/uL (ref 4.0–10.5)
nRBC: 0 % (ref 0.0–0.2)

## 2024-06-15 LAB — FERRITIN: Ferritin: 159 ng/mL (ref 11–307)

## 2024-06-15 LAB — IRON AND IRON BINDING CAPACITY (CC-WL,HP ONLY)
Iron: 37 ug/dL (ref 28–170)
Saturation Ratios: 10 % — ABNORMAL LOW (ref 10.4–31.8)
TIBC: 356 ug/dL (ref 250–450)
UIBC: 319 ug/dL (ref 148–442)

## 2024-06-15 MED ORDER — DEXAMETHASONE SODIUM PHOSPHATE 10 MG/ML IJ SOLN
10.0000 mg | Freq: Once | INTRAMUSCULAR | Status: AC
  Administered 2024-06-15: 10 mg via INTRAVENOUS
  Filled 2024-06-15: qty 1

## 2024-06-15 MED ORDER — SODIUM CHLORIDE 0.9 % IV SOLN
65.0000 mg/m2 | Freq: Once | INTRAVENOUS | Status: AC
  Administered 2024-06-15: 114 mg via INTRAVENOUS
  Filled 2024-06-15: qty 19

## 2024-06-15 MED ORDER — FAMOTIDINE IN NACL 20-0.9 MG/50ML-% IV SOLN
20.0000 mg | Freq: Once | INTRAVENOUS | Status: AC
  Administered 2024-06-15: 20 mg via INTRAVENOUS
  Filled 2024-06-15: qty 50

## 2024-06-15 MED ORDER — DIPHENHYDRAMINE HCL 50 MG/ML IJ SOLN
50.0000 mg | Freq: Once | INTRAMUSCULAR | Status: AC
  Administered 2024-06-15: 50 mg via INTRAVENOUS
  Filled 2024-06-15: qty 1

## 2024-06-15 MED ORDER — SODIUM CHLORIDE 0.9% FLUSH
10.0000 mL | Freq: Once | INTRAVENOUS | Status: AC
Start: 2024-06-15 — End: 2024-06-15
  Administered 2024-06-15: 10 mL

## 2024-06-15 MED ORDER — SODIUM CHLORIDE 0.9 % IV SOLN
INTRAVENOUS | Status: DC
Start: 2024-06-15 — End: 2024-06-15

## 2024-06-15 MED ORDER — SODIUM CHLORIDE 0.9% FLUSH: 10.0000 mL | INTRAVENOUS | Status: DC | PRN

## 2024-06-15 NOTE — Patient Instructions (Signed)

## 2024-06-15 NOTE — Progress Notes (Signed)
 Patient Care Team: [facility] as PCP - Diedre Rigg, Stephane BROCKS, RN (Inactive) as Oncology Nurse Navigator Tyree Nanetta SAILOR, RN as Oncology Nurse Navigator Odean Potts, MD as Consulting Physician (Hematology and Oncology) Aron Shoulders, MD as Consulting Physician (General Surgery) Izell Domino, MD as Attending Physician (Radiation Oncology)  DIAGNOSIS:  Encounter Diagnosis  Name Primary?   Malignant neoplasm of upper-outer quadrant of left breast in female, estrogen receptor positive (HCC) Yes    SUMMARY OF ONCOLOGIC HISTORY: Oncology History  Malignant neoplasm of upper-outer quadrant of left breast in female, estrogen receptor positive (HCC)  03/22/2024 Initial Diagnosis   Screening mammogram detected 2 masses in the left breast UOQ.  Mammogram measured 1.3 cm and 1.1 cm.  Contrast-enhanced mammogram was performed which revealed additional smaller masses (2.4 cm and 1.1 cm) spanning 7 x 3 cm.  Ultrasound measured the mass at 1.6 cm and 0.8 cm along with 2 enlarged axillary lymph nodes 1 of which was biopsy positive.  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   03/30/2024 Cancer Staging   Staging form: Breast, AJCC 8th Edition - Clinical: Stage IIA (cT2, cN1, cM0, G1, ER+, PR+, HER2-) - Signed by Odean Potts, MD on 03/30/2024 Stage prefix: Initial diagnosis Histologic grading system: 3 grade system   04/06/2024 -  Chemotherapy   Patient is on Treatment Plan : BREAST DOSE DENSE AC q14d / PACLitaxel  q7d       CHIEF COMPLIANT:   HISTORY OF PRESENT ILLNESS:  History of Present Illness Wanda Garcia is a 48 year old female with breast cancer who presents for follow-up after chemotherapy treatment.  She experiences mild mouth sores on her tongue. Her hemoglobin level is 10.2, a slight decrease from 10.6. She has a history of anemia and was advised to take iron supplements, which she did not. She expresses concern about the effectiveness of  chemotherapy and fears about the future. She has difficulty sleeping and prefers to avoid prescription sleep medications. She experiences hand cramping and has had a cough for the past two months.     ALLERGIES:  is allergic to pork-derived products.  MEDICATIONS:  Current Outpatient Medications  Medication Sig Dispense Refill   ALPRAZolam  (XANAX ) 0.25 MG tablet Take 0.25 mg by mouth at bedtime as needed for anxiety.     Cyanocobalamin (B-12 PO) Take 1 tablet by mouth once a week.     lidocaine -prilocaine  (EMLA ) cream Apply to affected area once (Patient not taking: Reported on 06/09/2024) 30 g 3   Omega-3 Fatty Acids (OMEGA 3 PO) Take 1 tablet by mouth once a week. (Patient not taking: Reported on 06/09/2024)     ondansetron  (ZOFRAN ) 8 MG tablet Take 1 tab (8 mg) by mouth every 8 hrs as needed for nausea/vomiting. Start third day after doxorubicin /cyclophosphamide  chemotherapy. (Patient not taking: Reported on 06/09/2024) 30 tablet 1   oxyCODONE  (OXY IR/ROXICODONE ) 5 MG immediate release tablet Take 1 tablet (5 mg total) by mouth every 6 (six) hours as needed for severe pain (pain score 7-10). 10 tablet 0   prochlorperazine  (COMPAZINE ) 10 MG tablet Take 1 tablet (10 mg total) by mouth every 6 (six) hours as needed for nausea or vomiting. 30 tablet 1   No current facility-administered medications for this visit.    PHYSICAL EXAMINATION: ECOG PERFORMANCE STATUS: 1 - Symptomatic but completely ambulatory  There were no vitals filed for this visit. There were no vitals filed for this visit.  Physical Exam   (  exam performed in the presence of a chaperone)  LABORATORY DATA:  I have reviewed the data as listed    Latest Ref Rng & Units 06/09/2024    9:10 AM 06/02/2024    7:59 AM 05/19/2024    8:13 AM  CMP  Glucose 70 - 99 mg/dL 867  888  886   BUN 6 - 20 mg/dL 12  11  11    Creatinine 0.44 - 1.00 mg/dL 9.42  9.46  9.45   Sodium 135 - 145 mmol/L 139  140  140   Potassium 3.5 - 5.1 mmol/L  3.9  4.0  3.8   Chloride 98 - 111 mmol/L 106  107  107   CO2 22 - 32 mmol/L 28  27  26    Calcium 8.9 - 10.3 mg/dL 9.2  9.0  9.2   Total Protein 6.5 - 8.1 g/dL 7.0  6.9  6.9   Total Bilirubin 0.0 - 1.2 mg/dL 0.2  0.2  0.2   Alkaline Phos 38 - 126 U/L 75  123  92   AST 15 - 41 U/L 36  15  15   ALT 0 - 44 U/L 77  25  22     Lab Results  Component Value Date   WBC 4.6 06/15/2024   HGB 10.2 (L) 06/15/2024   HCT 31.3 (L) 06/15/2024   MCV 78.4 (L) 06/15/2024   PLT 381 06/15/2024   NEUTROABS 2.8 06/15/2024    ASSESSMENT & PLAN:  Malignant neoplasm of upper-outer quadrant of left breast in female, estrogen receptor positive (HCC) 03/22/2024:Screening mammogram detected 2 masses in the left breast UOQ.  Mammogram measured 1.3 cm and 1.1 cm.  Contrast-enhanced mammogram was performed which revealed additional smaller masses (2.4 cm and 1.1 cm) spanning 7 x 3 cm.  Ultrasound measured the mass at 1.6 cm and 0.8 cm along with 2 enlarged axillary lymph nodes 1 of which was biopsy positive.  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    Treatment plan Neoadjuvant chemotherapy with dose dense Adriamycin  and Cytoxan  every 2 weeks x 4 followed by Taxol  weekly x 12 Breast conserving surgery with sentinel lymph node biopsy and targeted node dissection Adjuvant radiation therapy Adjuvant antiestrogen therapy CT CAP 04/06/2024: Left breast mass, left axillary lymph nodes, right hepatic lesion Liver MRI 04/11/2024: Liver hemangioma: Benign ------------------------------------------------------------------------------------------------------- Current Treatment: Completed 4 cycles of dose dense Adriamycin  and Cytoxan , today is cycle 3 Taxol  ECHO EF 60-65%   Chemo Toxicities: Nausea and Vomiting: Resolved after finishing Adriamycin  and Cytoxan  Insomnia: We discussed the role of sedative medications and decided not to pursue them. Cough: Secondary to sinus drainage Severe fatigue: Reduce  the dosage for fourth cycle of chemotherapy to make it more tolerable Anemia secondary to chemotherapy: Monitoring closely today's hemoglobin is 10.6 Peripheral 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  We reduced the dosage of Taxol .  She would also like to move her treatments to Wednesdays. Microcytic anemia: I would like to check iron studies today.   RTC weekly for chemo ------------------------------------- Assessment and Plan Assessment & Plan Malignant neoplasm of upper-outer quadrant of left breast, female Undergoing chemotherapy for estrogen receptor-positive breast cancer. Reassured her about treatment effectiveness and prognosis, noting over ninety percent achieve cure and no recurrence.  Chemotherapy-induced peripheral neuropathy Experiences neuropathy symptoms, including cramping in her hand. - Recommend over-the-counter magnesium  supplement for nerve function.  Anemia due to chemotherapy Anemia worsened slightly with chemotherapy. Hemoglobin decreased from 10.6 to 10.2. -  Check iron levels to assess need for intravenous iron supplementation.  Insomnia Reports difficulty sleeping and prefers not to use prescription sleep medications.  Cough Experiencing a cough for two months, attributed to sinus drainage caused by chemotherapy.  Oral mucositis Reports mild oral mucositis with a sore on her tongue.      No orders of the defined types were placed in this encounter.  The patient has a good understanding of the overall plan. she agrees with it. she will call with any problems that may develop before the next visit here. Total time spent: 30 mins including face to face time and time spent for planning, charting and co-ordination of care   Viinay K Reuven Braver, MD 06/15/24

## 2024-06-15 NOTE — Telephone Encounter (Signed)
 [facility] CSW Progress Note  Patient's spouse called and left VM for primary Clinical Social Worker (CSW) requesting a call back. Covering CSW attempted to reach patient's spouse to assess for needs. Patient's spouse did not answer call, voicemail box not set up.   Follow Up Plan:  CSW will attempt again to reach patient.     Lizbeth Sprague, LCSW Clinical Social Worker [facility]

## 2024-06-15 NOTE — Assessment & Plan Note (Signed)
 03/22/2024:Screening mammogram detected 2 masses in the left breast UOQ.  Mammogram measured 1.3 cm and 1.1 cm.  Contrast-enhanced mammogram was performed which revealed additional smaller masses (2.4 cm and 1.1 cm) spanning 7 x 3 cm.  Ultrasound measured the mass at 1.6 cm and 0.8 cm along with 2 enlarged axillary lymph nodes 1 of which was biopsy positive.  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    Treatment plan Neoadjuvant chemotherapy with dose dense Adriamycin  and Cytoxan  every 2 weeks x 4 followed by Taxol  weekly x 12 Breast conserving surgery with sentinel lymph node biopsy and targeted node dissection Adjuvant radiation therapy Adjuvant antiestrogen therapy CT CAP 04/06/2024: Left breast mass, left axillary lymph nodes, right hepatic lesion Liver MRI 04/11/2024: Liver hemangioma: Benign ------------------------------------------------------------------------------------------------------- Current Treatment: Completed 4 cycles of dose dense Adriamycin  and Cytoxan , today is cycle 3 Taxol  ECHO EF 60-65%   Chemo Toxicities: Nausea and Vomiting Insomnia Cough Severe fatigue: Reduce the dosage for fourth cycle of chemotherapy to make it more tolerable Anemia secondary to chemotherapy: Monitoring closely today's hemoglobin is 10.6 Peripheral 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  We will reduce the dosage of Taxol  today.  She would also like to move her treatments to Wednesdays.   RTC weekly for chemo

## 2024-06-16 ENCOUNTER — Encounter: Payer: Self-pay | Admitting: *Deleted

## 2024-06-18 ENCOUNTER — Other Ambulatory Visit: Payer: Self-pay

## 2024-06-21 ENCOUNTER — Telehealth: Payer: Self-pay

## 2024-06-21 NOTE — Telephone Encounter (Signed)
 Tried to call and leave a voicemail to confirm appt for 8/6, voicemail box has not been set up yet.

## 2024-06-22 ENCOUNTER — Inpatient Hospital Stay: Attending: Licensed Clinical Social Worker

## 2024-06-22 ENCOUNTER — Inpatient Hospital Stay

## 2024-06-22 ENCOUNTER — Inpatient Hospital Stay: Admitting: Hematology and Oncology

## 2024-06-22 VITALS — BP 126/61 | HR 80 | Temp 98.6°F | Resp 19 | Wt 165.3 lb

## 2024-06-22 DIAGNOSIS — Z1732 Human epidermal growth factor receptor 2 negative status: Secondary | ICD-10-CM | POA: Insufficient documentation

## 2024-06-22 DIAGNOSIS — D6481 Anemia due to antineoplastic chemotherapy: Secondary | ICD-10-CM | POA: Diagnosis not present

## 2024-06-22 DIAGNOSIS — Z170 Estrogen receptor positive status [ER+]: Secondary | ICD-10-CM | POA: Insufficient documentation

## 2024-06-22 DIAGNOSIS — Z1721 Progesterone receptor positive status: Secondary | ICD-10-CM | POA: Insufficient documentation

## 2024-06-22 DIAGNOSIS — C50412 Malignant neoplasm of upper-outer quadrant of left female breast: Secondary | ICD-10-CM

## 2024-06-22 DIAGNOSIS — F17210 Nicotine dependence, cigarettes, uncomplicated: Secondary | ICD-10-CM | POA: Insufficient documentation

## 2024-06-22 DIAGNOSIS — T451X5A Adverse effect of antineoplastic and immunosuppressive drugs, initial encounter: Secondary | ICD-10-CM | POA: Diagnosis not present

## 2024-06-22 DIAGNOSIS — G620 Drug-induced polyneuropathy: Secondary | ICD-10-CM | POA: Diagnosis not present

## 2024-06-22 DIAGNOSIS — Z5111 Encounter for antineoplastic chemotherapy: Secondary | ICD-10-CM | POA: Diagnosis present

## 2024-06-22 DIAGNOSIS — Z95828 Presence of other vascular implants and grafts: Secondary | ICD-10-CM

## 2024-06-22 LAB — CMP (CANCER CENTER ONLY)
ALT: 74 U/L — ABNORMAL HIGH (ref 0–44)
AST: 31 U/L (ref 15–41)
Albumin: 4.2 g/dL (ref 3.5–5.0)
Alkaline Phosphatase: 76 U/L (ref 38–126)
Anion gap: 6 (ref 5–15)
BUN: 13 mg/dL (ref 6–20)
CO2: 27 mmol/L (ref 22–32)
Calcium: 9.2 mg/dL (ref 8.9–10.3)
Chloride: 106 mmol/L (ref 98–111)
Creatinine: 0.51 mg/dL (ref 0.44–1.00)
GFR, Estimated: >60 mL/min (ref >60)
Glucose, Bld: 105 mg/dL — ABNORMAL HIGH (ref 70–99)
Potassium: 3.9 mmol/L (ref 3.5–5.1)
Sodium: 139 mmol/L (ref 135–145)
Total Bilirubin: 0.3 mg/dL (ref 0.0–1.2)
Total Protein: 7 g/dL (ref 6.5–8.1)

## 2024-06-22 LAB — CBC WITH DIFFERENTIAL (CANCER CENTER ONLY)
Abs Immature Granulocytes: 0.02 K/uL (ref 0.00–0.07)
Basophils Absolute: 0 K/uL (ref 0.0–0.1)
Basophils Relative: 1 %
Eosinophils Absolute: 0.2 K/uL (ref 0.0–0.5)
Eosinophils Relative: 5 %
HCT: 33.3 % — ABNORMAL LOW (ref 36.0–46.0)
Hemoglobin: 10.8 g/dL — ABNORMAL LOW (ref 12.0–15.0)
Immature Granulocytes: 0 %
Lymphocytes Relative: 21 %
Lymphs Abs: 1 K/uL (ref 0.7–4.0)
MCH: 25.7 pg — ABNORMAL LOW (ref 26.0–34.0)
MCHC: 32.4 g/dL (ref 30.0–36.0)
MCV: 79.3 fL — ABNORMAL LOW (ref 80.0–100.0)
Monocytes Absolute: 0.5 K/uL (ref 0.1–1.0)
Monocytes Relative: 11 %
Neutro Abs: 2.8 K/uL (ref 1.7–7.7)
Neutrophils Relative %: 62 %
Platelet Count: 432 K/uL — ABNORMAL HIGH (ref 150–400)
RBC: 4.2 MIL/uL (ref 3.87–5.11)
RDW: 19.1 % — ABNORMAL HIGH (ref 11.5–15.5)
WBC Count: 4.6 K/uL (ref 4.0–10.5)
nRBC: 0 % (ref 0.0–0.2)

## 2024-06-22 MED ORDER — DEXAMETHASONE SODIUM PHOSPHATE 10 MG/ML IJ SOLN
10.0000 mg | Freq: Once | INTRAMUSCULAR | Status: AC
  Administered 2024-06-22: 10 mg via INTRAVENOUS
  Filled 2024-06-22: qty 1

## 2024-06-22 MED ORDER — SODIUM CHLORIDE 0.9% FLUSH: 10.0000 mL | INTRAVENOUS | Status: DC | PRN

## 2024-06-22 MED ORDER — SODIUM CHLORIDE 0.9 % IV SOLN
65.0000 mg/m2 | Freq: Once | INTRAVENOUS | Status: AC
  Administered 2024-06-22: 114 mg via INTRAVENOUS
  Filled 2024-06-22: qty 19

## 2024-06-22 MED ORDER — FAMOTIDINE IN NACL 20-0.9 MG/50ML-% IV SOLN
20.0000 mg | Freq: Once | INTRAVENOUS | Status: AC
  Administered 2024-06-22: 20 mg via INTRAVENOUS
  Filled 2024-06-22: qty 50

## 2024-06-22 MED ORDER — DIPHENHYDRAMINE HCL 50 MG/ML IJ SOLN
50.0000 mg | Freq: Once | INTRAMUSCULAR | Status: AC
  Administered 2024-06-22: 50 mg via INTRAVENOUS
  Filled 2024-06-22: qty 1

## 2024-06-22 MED ORDER — SODIUM CHLORIDE 0.9 % IV SOLN: INTRAVENOUS | Status: DC

## 2024-06-22 MED ORDER — SODIUM CHLORIDE 0.9% FLUSH
10.0000 mL | Freq: Once | INTRAVENOUS | Status: AC
  Administered 2024-06-22: 10 mL

## 2024-06-22 NOTE — Patient Instructions (Signed)

## 2024-06-22 NOTE — Progress Notes (Signed)
 Patient Care Team: [facility] as PCP - Diedre Rigg, Stephane BROCKS, RN (Inactive) as Oncology Nurse Navigator Tyree Nanetta SAILOR, RN as Oncology Nurse Navigator Odean Potts, MD as Consulting Physician (Hematology and Oncology) Aron Shoulders, MD as Consulting Physician (General Surgery) Izell Domino, MD as Attending Physician (Radiation Oncology)  DIAGNOSIS:  No diagnosis found.   SUMMARY OF ONCOLOGIC HISTORY: Oncology History  Malignant neoplasm of upper-outer quadrant of left breast in female, estrogen receptor positive (HCC)  03/22/2024 Initial Diagnosis   Screening mammogram detected 2 masses in the left breast UOQ.  Mammogram measured 1.3 cm and 1.1 cm.  Contrast-enhanced mammogram was performed which revealed additional smaller masses (2.4 cm and 1.1 cm) spanning 7 x 3 cm.  Ultrasound measured the mass at 1.6 cm and 0.8 cm along with 2 enlarged axillary lymph nodes 1 of which was biopsy positive.  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   03/30/2024 Cancer Staging   Staging form: Breast, AJCC 8th Edition - Clinical: Stage IIA (cT2, cN1, cM0, G1, ER+, PR+, HER2-) - Signed by Odean Potts, MD on 03/30/2024 Stage prefix: Initial diagnosis Histologic grading system: 3 grade system   04/06/2024 -  Chemotherapy   Patient is on Treatment Plan : BREAST DOSE DENSE AC q14d / PACLitaxel  q7d       CHIEF COMPLIANT:  On taxol .  HISTORY OF PRESENT ILLNESS:  History of Present Illness Wanda Garcia is a 48 year old female with breast cancer who presents for follow-up after chemotherapy treatment.  She experiences ongoing neuropathy characterized by cramping, tingling, and numbness, which has shown slight improvement with magnesium  supplementation. Although some numbness persists, it is less severe than before. She reports that she still has some numbness in her feet, though it is less than before.  She describes significant leg pain from the knee to the  foot, which began suddenly two days ago while sitting. The pain was intense enough to cause distress until she fell asleep. Pain medication provided some relief, and the pain has since improved.  She experiences burning pain in her breast and bones, which is sometimes bearable. She is concerned about whether this pain is normal post-surgery and chemotherapy.  She has had a persistent cough for three months, which she associates with hair loss from chemotherapy. Tylenol  has provided minimal relief.  Her menstrual periods have stopped for two months, which she attributes to chemotherapy.   Rest of the pertinent 10 point ROS reviewed and neg.  ALLERGIES:  is allergic to pork-derived products.  MEDICATIONS:  Current Outpatient Medications  Medication Sig Dispense Refill   ALPRAZolam  (XANAX ) 0.25 MG tablet Take 0.25 mg by mouth at bedtime as needed for anxiety.     Cyanocobalamin (B-12 PO) Take 1 tablet by mouth once a week. (Patient not taking: Reported on 06/15/2024)     lidocaine -prilocaine  (EMLA ) cream Apply to affected area once (Patient not taking: Reported on 06/15/2024) 30 g 3   Omega-3 Fatty Acids (OMEGA 3 PO) Take 1 tablet by mouth once a week. (Patient not taking: Reported on 06/15/2024)     ondansetron  (ZOFRAN ) 8 MG tablet Take 1 tab (8 mg) by mouth every 8 hrs as needed for nausea/vomiting. Start third day after doxorubicin /cyclophosphamide  chemotherapy. (Patient not taking: Reported on 06/15/2024) 30 tablet 1   oxyCODONE  (OXY IR/ROXICODONE ) 5 MG immediate release tablet Take 1 tablet (5 mg total) by mouth every 6 (six) hours as needed for severe pain (pain score  7-10). (Patient not taking: Reported on 06/15/2024) 10 tablet 0   prochlorperazine  (COMPAZINE ) 10 MG tablet Take 1 tablet (10 mg total) by mouth every 6 (six) hours as needed for nausea or vomiting. (Patient not taking: Reported on 06/15/2024) 30 tablet 1   No current facility-administered medications for this visit.    PHYSICAL  EXAMINATION: ECOG PERFORMANCE STATUS: 1 - Symptomatic but completely ambulatory  Vitals:   06/22/24 1121  BP: 126/61  Pulse: 80  Resp: 19  Temp: 98.6 F (37 C)  SpO2: 100%   Filed Weights   06/22/24 1121  Weight: 165 lb 4.8 oz (75 kg)    Physical Exam GENERAL: Alert, cooperative, well developed, no acute distress HEENT: Normocephalic, normal oropharynx, moist mucous membranes NECK: No axillary lymphadenopathy Palpable left breast mass about 1-2 cms noted in the lower outer quadrant, I can't compare since this is my first exam on her. ABDOMEN: Soft, non-tender, non-distended, without organomegaly, normal bowel sounds EXTREMITIES: No cyanosis or edema    LABORATORY DATA:  I have reviewed the data as listed    Latest Ref Rng & Units 06/15/2024    7:59 AM 06/09/2024    9:10 AM 06/02/2024    7:59 AM  CMP  Glucose 70 - 99 mg/dL 867  867  888   BUN 6 - 20 mg/dL 13  12  11    Creatinine 0.44 - 1.00 mg/dL 9.48  9.42  9.46   Sodium 135 - 145 mmol/L 138  139  140   Potassium 3.5 - 5.1 mmol/L 3.9  3.9  4.0   Chloride 98 - 111 mmol/L 107  106  107   CO2 22 - 32 mmol/L 26  28  27    Calcium 8.9 - 10.3 mg/dL 8.8  9.2  9.0   Total Protein 6.5 - 8.1 g/dL 6.7  7.0  6.9   Total Bilirubin 0.0 - 1.2 mg/dL 0.3  0.2  0.2   Alkaline Phos 38 - 126 U/L 69  75  123   AST 15 - 41 U/L 25  36  15   ALT 0 - 44 U/L 76  77  25     Lab Results  Component Value Date   WBC 4.6 06/15/2024   HGB 10.2 (L) 06/15/2024   HCT 31.3 (L) 06/15/2024   MCV 78.4 (L) 06/15/2024   PLT 381 06/15/2024   NEUTROABS 2.8 06/15/2024    ASSESSMENT & PLAN:  Malignant neoplasm of upper-outer quadrant of left breast in female, estrogen receptor positive (HCC) 03/22/2024:Screening mammogram detected 2 masses in the left breast UOQ.  Mammogram measured 1.3 cm and 1.1 cm.  Contrast-enhanced mammogram was performed which revealed additional smaller masses (2.4 cm and 1.1 cm) spanning 7 x 3 cm.  Ultrasound measured the mass at  1.6 cm and 0.8 cm along with 2 enlarged axillary lymph nodes 1 of which was biopsy positive.  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    Treatment plan Neoadjuvant chemotherapy with dose dense Adriamycin  and Cytoxan  every 2 weeks x 4 followed by Taxol  weekly x 12 Breast conserving surgery with sentinel lymph node biopsy and targeted node dissection Adjuvant radiation therapy Adjuvant antiestrogen therapy CT CAP 04/06/2024: Left breast mass, left axillary lymph nodes, right hepatic lesion Liver MRI 04/11/2024: Liver hemangioma: Benign ------------------------------------------------------------------------------------------------------- Current Treatment: Completed 4 cycles of dose dense Adriamycin  and Cytoxan , today is cycle 3 Taxol  ECHO EF 60-65%   Chemo Toxicities: Nausea and Vomiting: Resolved after  finishing Adriamycin  and Cytoxan  Insomnia: We discussed the role of sedative medications and decided not to pursue them. 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  We reduced the dosage of Taxol .  She would also like to move her treatments to Wednesdays. Microcytic anemia: I would like to check iron studies today.   RTC weekly for chemo ------------------------------------- Assessment and Plan Assessment & Plan  Breast cancer, currently receiving chemotherapy Ok to continue same dose of taxol .  Chemotherapy-induced peripheral neuropathy Peripheral neuropathy with numbness and tingling persists, though improved. It is a side effect of chemotherapy. - Instruct to report if neuropathy worsens.  Chemotherapy-induced myalgia and muscle cramps Myalgia and muscle cramps, likely due to paclitaxel , caused severe leg pain, resolved with pain medication. -  Instruct to call if severe pain recurs.  Chronic cough, under evaluation Chronic cough has persisted for three months.  CTA on exam today I encouraged her to discuss with Dr Odean if it continues to bother her.  Chemotherapy-induced amenorrhea Amenorrhea present for two months, expected due to chemotherapy.  Goals of Care She expressed disappointment about being asked about advanced directives, feeling it was inappropriate. Explained it is standard procedure.   No orders of the defined types were placed in this encounter.  The patient has a good understanding of the overall plan. she agrees with it. she will call with any problems that may develop before the next visit here. Total time spent: 40 mins including face to face time and time spent for planning, charting and co-ordination of care   Amber Stalls, MD 06/22/24

## 2024-06-25 ENCOUNTER — Other Ambulatory Visit: Payer: Self-pay

## 2024-06-26 ENCOUNTER — Other Ambulatory Visit: Payer: Self-pay

## 2024-06-29 NOTE — Assessment & Plan Note (Signed)
 03/22/2024:Screening mammogram detected 2 masses in the left breast UOQ.  Mammogram measured 1.3 cm and 1.1 cm.  Contrast-enhanced mammogram was performed which revealed additional smaller masses (2.4 cm and 1.1 cm) spanning 7 x 3 cm.  Ultrasound measured the mass at 1.6 cm and 0.8 cm along with 2 enlarged axillary lymph nodes 1 of which was biopsy positive.  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    Treatment plan Neoadjuvant chemotherapy with dose dense Adriamycin  and Cytoxan  every 2 weeks x 4 followed by Taxol  weekly x 12 Breast conserving surgery with sentinel lymph node biopsy and targeted node dissection Adjuvant radiation therapy Adjuvant antiestrogen therapy CT CAP 04/06/2024: Left breast mass, left axillary lymph nodes, right hepatic lesion Liver MRI 04/11/2024: Liver hemangioma: Benign ------------------------------------------------------------------------------------------------------- Current Treatment: Completed 4 cycles of dose dense Adriamycin  and Cytoxan , today is cycle 5 Taxol  ECHO EF 60-65%   Chemo Toxicities: Nausea and Vomiting: Resolved after finishing Adriamycin  and Cytoxan  Insomnia: We discussed the role of sedative medications and decided not to pursue them. 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  We reduced the dosage of Taxol .  She would also like to move her treatments to Wednesdays. Microcytic anemia: I would like to check iron studies today.   RTC weekly for chemo

## 2024-06-30 ENCOUNTER — Encounter: Payer: Self-pay | Admitting: *Deleted

## 2024-06-30 ENCOUNTER — Inpatient Hospital Stay (HOSPITAL_BASED_OUTPATIENT_CLINIC_OR_DEPARTMENT_OTHER)

## 2024-06-30 ENCOUNTER — Inpatient Hospital Stay (HOSPITAL_BASED_OUTPATIENT_CLINIC_OR_DEPARTMENT_OTHER): Admitting: Hematology and Oncology

## 2024-06-30 ENCOUNTER — Inpatient Hospital Stay

## 2024-06-30 VITALS — BP 132/80 | HR 90 | Resp 16

## 2024-06-30 VITALS — BP 120/78 | HR 84 | Temp 98.3°F | Resp 18 | Ht 60.0 in | Wt 163.9 lb

## 2024-06-30 DIAGNOSIS — Z95828 Presence of other vascular implants and grafts: Secondary | ICD-10-CM

## 2024-06-30 DIAGNOSIS — Z170 Estrogen receptor positive status [ER+]: Secondary | ICD-10-CM | POA: Diagnosis not present

## 2024-06-30 DIAGNOSIS — C50412 Malignant neoplasm of upper-outer quadrant of left female breast: Secondary | ICD-10-CM

## 2024-06-30 DIAGNOSIS — Z5111 Encounter for antineoplastic chemotherapy: Secondary | ICD-10-CM | POA: Diagnosis not present

## 2024-06-30 LAB — CBC WITH DIFFERENTIAL (CANCER CENTER ONLY)
Abs Immature Granulocytes: 0.02 K/uL (ref 0.00–0.07)
Basophils Absolute: 0 K/uL (ref 0.0–0.1)
Basophils Relative: 1 %
Eosinophils Absolute: 0.2 K/uL (ref 0.0–0.5)
Eosinophils Relative: 4 %
HCT: 33.3 % — ABNORMAL LOW (ref 36.0–46.0)
Hemoglobin: 10.9 g/dL — ABNORMAL LOW (ref 12.0–15.0)
Immature Granulocytes: 1 %
Lymphocytes Relative: 28 %
Lymphs Abs: 1.1 K/uL (ref 0.7–4.0)
MCH: 26.4 pg (ref 26.0–34.0)
MCHC: 32.7 g/dL (ref 30.0–36.0)
MCV: 80.6 fL (ref 80.0–100.0)
Monocytes Absolute: 0.4 K/uL (ref 0.1–1.0)
Monocytes Relative: 9 %
Neutro Abs: 2.2 K/uL (ref 1.7–7.7)
Neutrophils Relative %: 57 %
Platelet Count: 317 K/uL (ref 150–400)
RBC: 4.13 MIL/uL (ref 3.87–5.11)
RDW: 18 % — ABNORMAL HIGH (ref 11.5–15.5)
WBC Count: 4 K/uL (ref 4.0–10.5)
nRBC: 0 % (ref 0.0–0.2)

## 2024-06-30 LAB — CMP (CANCER CENTER ONLY)
ALT: 72 U/L — ABNORMAL HIGH (ref 0–44)
AST: 25 U/L (ref 15–41)
Albumin: 4.2 g/dL (ref 3.5–5.0)
Alkaline Phosphatase: 78 U/L (ref 38–126)
Anion gap: 5 (ref 5–15)
BUN: 10 mg/dL (ref 6–20)
CO2: 27 mmol/L (ref 22–32)
Calcium: 9.1 mg/dL (ref 8.9–10.3)
Chloride: 108 mmol/L (ref 98–111)
Creatinine: 0.51 mg/dL (ref 0.44–1.00)
GFR, Estimated: >60 mL/min (ref >60)
Glucose, Bld: 105 mg/dL — ABNORMAL HIGH (ref 70–99)
Potassium: 4 mmol/L (ref 3.5–5.1)
Sodium: 140 mmol/L (ref 135–145)
Total Bilirubin: 0.3 mg/dL (ref 0.0–1.2)
Total Protein: 6.9 g/dL (ref 6.5–8.1)

## 2024-06-30 LAB — PREGNANCY, URINE: Preg Test, Ur: NEGATIVE

## 2024-06-30 MED ORDER — FAMOTIDINE IN NACL 20-0.9 MG/50ML-% IV SOLN
20.0000 mg | Freq: Once | INTRAVENOUS | Status: AC
  Administered 2024-06-30: 20 mg via INTRAVENOUS
  Filled 2024-06-30: qty 50

## 2024-06-30 MED ORDER — DIPHENHYDRAMINE HCL 50 MG/ML IJ SOLN
50.0000 mg | Freq: Once | INTRAMUSCULAR | Status: AC
  Administered 2024-06-30: 50 mg via INTRAVENOUS
  Filled 2024-06-30: qty 1

## 2024-06-30 MED ORDER — SODIUM CHLORIDE 0.9% FLUSH
10.0000 mL | Freq: Once | INTRAVENOUS | Status: AC
Start: 2024-06-30 — End: 2024-06-30
  Administered 2024-06-30: 10 mL

## 2024-06-30 MED ORDER — SODIUM CHLORIDE 0.9% FLUSH: 10.0000 mL | INTRAVENOUS | Status: DC | PRN

## 2024-06-30 MED ORDER — DEXAMETHASONE SODIUM PHOSPHATE 10 MG/ML IJ SOLN
10.0000 mg | Freq: Once | INTRAMUSCULAR | Status: AC
  Administered 2024-06-30: 10 mg via INTRAVENOUS
  Filled 2024-06-30: qty 1

## 2024-06-30 MED ORDER — SODIUM CHLORIDE 0.9 % IV SOLN
45.0000 mg/m2 | Freq: Once | INTRAVENOUS | Status: AC
  Administered 2024-06-30: 78 mg via INTRAVENOUS
  Filled 2024-06-30: qty 13

## 2024-06-30 MED ORDER — SODIUM CHLORIDE 0.9 % IV SOLN: INTRAVENOUS | Status: DC

## 2024-06-30 NOTE — Patient Instructions (Signed)

## 2024-06-30 NOTE — Progress Notes (Signed)
 Patient Care Team: [facility] as PCP - Diedre Rigg, Stephane BROCKS, RN (Inactive) as Oncology Nurse Navigator Tyree Nanetta SAILOR, RN as Oncology Nurse Navigator Odean Potts, MD as Consulting Physician (Hematology and Oncology) Aron Shoulders, MD as Consulting Physician (General Surgery) Izell Domino, MD as Attending Physician (Radiation Oncology)  DIAGNOSIS:  Encounter Diagnosis  Name Primary?   Malignant neoplasm of upper-outer quadrant of left breast in female, estrogen receptor positive (HCC) Yes    SUMMARY OF ONCOLOGIC HISTORY: Oncology History  Malignant neoplasm of upper-outer quadrant of left breast in female, estrogen receptor positive (HCC)  03/22/2024 Initial Diagnosis   Screening mammogram detected 2 masses in the left breast UOQ.  Mammogram measured 1.3 cm and 1.1 cm.  Contrast-enhanced mammogram was performed which revealed additional smaller masses (2.4 cm and 1.1 cm) spanning 7 x 3 cm.  Ultrasound measured the mass at 1.6 cm and 0.8 cm along with 2 enlarged axillary lymph nodes 1 of which was biopsy positive.  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   03/30/2024 Cancer Staging   Staging form: Breast, AJCC 8th Edition - Clinical: Stage IIA (cT2, cN1, cM0, G1, ER+, PR+, HER2-) - Signed by Odean Potts, MD on 03/30/2024 Stage prefix: Initial diagnosis Histologic grading system: 3 grade system   04/06/2024 -  Chemotherapy   Patient is on Treatment Plan : BREAST DOSE DENSE AC q14d / PACLitaxel  q7d       CHIEF COMPLIANT: Cycle 5 Taxol   HISTORY OF PRESENT ILLNESS:   History of Present Illness Wanda Garcia is a 48 year old female undergoing chemotherapy who presents with numbness and tingling in her extremities.  Numbness and tingling began in her hands and now sometimes affect her entire arm and legs. Symptoms are more pronounced in the morning and at the end of the day, with the entire hand affected, particularly the fingertips. No  constant numbness is present.  She is undergoing her fifth session of chemotherapy with Taxol . Her hemoglobin was previously 10.2 and is currently 10.9. Recent labs show normal white blood cell and platelet counts.  She has anemia and previously required iron injections for two months.     ALLERGIES:  is allergic to pork-derived products.  MEDICATIONS:  Current Outpatient Medications  Medication Sig Dispense Refill   ALPRAZolam  (XANAX ) 0.25 MG tablet Take 0.25 mg by mouth at bedtime as needed for anxiety.     Cyanocobalamin (B-12 PO) Take 1 tablet by mouth once a week. (Patient not taking: Reported on 06/30/2024)     lidocaine -prilocaine  (EMLA ) cream Apply to affected area once (Patient not taking: Reported on 06/30/2024) 30 g 3   Omega-3 Fatty Acids (OMEGA 3 PO) Take 1 tablet by mouth once a week. (Patient not taking: Reported on 06/30/2024)     ondansetron  (ZOFRAN ) 8 MG tablet Take 1 tab (8 mg) by mouth every 8 hrs as needed for nausea/vomiting. Start third day after doxorubicin /cyclophosphamide  chemotherapy. (Patient not taking: Reported on 06/30/2024) 30 tablet 1   oxyCODONE  (OXY IR/ROXICODONE ) 5 MG immediate release tablet Take 1 tablet (5 mg total) by mouth every 6 (six) hours as needed for severe pain (pain score 7-10). (Patient not taking: Reported on 06/30/2024) 10 tablet 0   prochlorperazine  (COMPAZINE ) 10 MG tablet Take 1 tablet (10 mg total) by mouth every 6 (six) hours as needed for nausea or vomiting. (Patient not taking: Reported on 06/30/2024) 30 tablet 1   No current facility-administered medications for this visit.  PHYSICAL EXAMINATION: ECOG PERFORMANCE STATUS: 1 - Symptomatic but completely ambulatory  Vitals:   06/30/24 0800  BP: 120/78  Pulse: 84  Resp: 18  Temp: 98.3 F (36.8 C)  SpO2: 100%   Filed Weights   06/30/24 0800  Weight: 163 lb 14.4 oz (74.3 kg)    Physical Exam   (exam performed in the presence of a chaperone)  LABORATORY DATA:  I have  reviewed the data as listed    Latest Ref Rng & Units 06/22/2024   11:07 AM 06/15/2024    7:59 AM 06/09/2024    9:10 AM  CMP  Glucose 70 - 99 mg/dL 894  867  867   BUN 6 - 20 mg/dL 13  13  12    Creatinine 0.44 - 1.00 mg/dL 9.48  9.48  9.42   Sodium 135 - 145 mmol/L 139  138  139   Potassium 3.5 - 5.1 mmol/L 3.9  3.9  3.9   Chloride 98 - 111 mmol/L 106  107  106   CO2 22 - 32 mmol/L 27  26  28    Calcium 8.9 - 10.3 mg/dL 9.2  8.8  9.2   Total Protein 6.5 - 8.1 g/dL 7.0  6.7  7.0   Total Bilirubin 0.0 - 1.2 mg/dL 0.3  0.3  0.2   Alkaline Phos 38 - 126 U/L 76  69  75   AST 15 - 41 U/L 31  25  36   ALT 0 - 44 U/L 74  76  77     Lab Results  Component Value Date   WBC 4.0 06/30/2024   HGB 10.9 (L) 06/30/2024   HCT 33.3 (L) 06/30/2024   MCV 80.6 06/30/2024   PLT 317 06/30/2024   NEUTROABS 2.2 06/30/2024    ASSESSMENT & PLAN:  Malignant neoplasm of upper-outer quadrant of left breast in female, estrogen receptor positive (HCC) 03/22/2024:Screening mammogram detected 2 masses in the left breast UOQ.  Mammogram measured 1.3 cm and 1.1 cm.  Contrast-enhanced mammogram was performed which revealed additional smaller masses (2.4 cm and 1.1 cm) spanning 7 x 3 cm.  Ultrasound measured the mass at 1.6 cm and 0.8 cm along with 2 enlarged axillary lymph nodes 1 of which was biopsy positive.  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    Treatment plan Neoadjuvant chemotherapy with dose dense Adriamycin  and Cytoxan  every 2 weeks x 4 followed by Taxol  weekly x 12 Breast conserving surgery with sentinel lymph node biopsy and targeted node dissection Adjuvant radiation therapy Adjuvant antiestrogen therapy CT CAP 04/06/2024: Left breast mass, left axillary lymph nodes, right hepatic lesion Liver MRI 04/11/2024: Liver hemangioma: Benign ------------------------------------------------------------------------------------------------------- Current Treatment: Completed 4 cycles of  dose dense Adriamycin  and Cytoxan , today is cycle 5 Taxol  ECHO EF 60-65%   Chemo Toxicities: Nausea and Vomiting: Resolved after finishing Adriamycin  and Cytoxan  Insomnia: We discussed the role of sedative medications and decided not to pursue them. 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  We reduced the dosage of Taxol  once again to 45 mg/m.   She started off having neuropathy even before we started chemo.  I do not think it is any significant worsening since we lowered the dosage.  Our goal is to finish up at least 8 cycles of Taxol  and  then discontinue her treatment    RTC weekly for chemo ------------------------------------- Assessment and Plan Assessment & Plan Malignant neoplasm of upper-outer quadrant of left breast, female Currently on fifth cycle of Taxol , with 80% of treatment completed. Surgery planned post-chemotherapy. Concern about neuropathy risk with continued chemotherapy. - Continue current and next cycle of Taxol , then reassess. - Lower Taxol  dose to manage neuropathy. - Plan surgery post-chemotherapy.  Chemotherapy-induced peripheral neuropathy Experiencing numbness and tingling in extremities, risk of permanent neuropathy with current Taxol  dose. - Lower Taxol  dose to manage neuropathy.  Anemia due to chemotherapy Hemoglobin improving to 10.9, recovery from chemotherapy-induced anemia noted. - Monitor hemoglobin levels.      No orders of the defined types were placed in this encounter.  The patient has a good understanding of the overall plan. she agrees with it. she will call with any problems that may develop before the next visit here. Total time spent: 45 mins including face to face time and time spent for planning, charting and co-ordination  of care   Naomi MARLA Chad, MD 06/30/24

## 2024-07-01 ENCOUNTER — Telehealth: Payer: Self-pay | Admitting: *Deleted

## 2024-07-01 NOTE — Telephone Encounter (Signed)
 Received call from pt spouse stating pt is experiencing worsening neuropathy and would like to make MD aware.  States neuropathy is not affecting ADL's at this time.  MD out of office, pt scheduled to f/u with our office next week.  Pt educated to discuss with provider during visit. Verbalized understanding.

## 2024-07-06 ENCOUNTER — Inpatient Hospital Stay (HOSPITAL_BASED_OUTPATIENT_CLINIC_OR_DEPARTMENT_OTHER): Admitting: Adult Health

## 2024-07-06 ENCOUNTER — Inpatient Hospital Stay

## 2024-07-06 ENCOUNTER — Encounter: Payer: Self-pay | Admitting: Adult Health

## 2024-07-06 VITALS — BP 130/82 | HR 92 | Temp 98.1°F | Resp 16 | Ht 60.0 in | Wt 164.6 lb

## 2024-07-06 VITALS — BP 134/82 | HR 91 | Temp 98.1°F | Resp 17

## 2024-07-06 DIAGNOSIS — C50412 Malignant neoplasm of upper-outer quadrant of left female breast: Secondary | ICD-10-CM

## 2024-07-06 DIAGNOSIS — Z170 Estrogen receptor positive status [ER+]: Secondary | ICD-10-CM

## 2024-07-06 DIAGNOSIS — Z5111 Encounter for antineoplastic chemotherapy: Secondary | ICD-10-CM | POA: Diagnosis not present

## 2024-07-06 DIAGNOSIS — Z95828 Presence of other vascular implants and grafts: Secondary | ICD-10-CM

## 2024-07-06 LAB — CBC WITH DIFFERENTIAL (CANCER CENTER ONLY)
Abs Immature Granulocytes: 0.02 K/uL (ref 0.00–0.07)
Basophils Absolute: 0 K/uL (ref 0.0–0.1)
Basophils Relative: 1 %
Eosinophils Absolute: 0.2 K/uL (ref 0.0–0.5)
Eosinophils Relative: 4 %
HCT: 35.2 % — ABNORMAL LOW (ref 36.0–46.0)
Hemoglobin: 11.2 g/dL — ABNORMAL LOW (ref 12.0–15.0)
Immature Granulocytes: 0 %
Lymphocytes Relative: 22 %
Lymphs Abs: 1.2 K/uL (ref 0.7–4.0)
MCH: 26 pg (ref 26.0–34.0)
MCHC: 31.8 g/dL (ref 30.0–36.0)
MCV: 81.7 fL (ref 80.0–100.0)
Monocytes Absolute: 0.4 K/uL (ref 0.1–1.0)
Monocytes Relative: 7 %
Neutro Abs: 3.4 K/uL (ref 1.7–7.7)
Neutrophils Relative %: 66 %
Platelet Count: 330 K/uL (ref 150–400)
RBC: 4.31 MIL/uL (ref 3.87–5.11)
RDW: 17.2 % — ABNORMAL HIGH (ref 11.5–15.5)
WBC Count: 5.2 K/uL (ref 4.0–10.5)
nRBC: 0 % (ref 0.0–0.2)

## 2024-07-06 LAB — CMP (CANCER CENTER ONLY)
ALT: 42 U/L (ref 0–44)
AST: 22 U/L (ref 15–41)
Albumin: 4.2 g/dL (ref 3.5–5.0)
Alkaline Phosphatase: 69 U/L (ref 38–126)
Anion gap: 5 (ref 5–15)
BUN: 9 mg/dL (ref 6–20)
CO2: 26 mmol/L (ref 22–32)
Calcium: 9 mg/dL (ref 8.9–10.3)
Chloride: 108 mmol/L (ref 98–111)
Creatinine: 0.43 mg/dL — ABNORMAL LOW (ref 0.44–1.00)
GFR, Estimated: >60 mL/min (ref >60)
Glucose, Bld: 123 mg/dL — ABNORMAL HIGH (ref 70–99)
Potassium: 4 mmol/L (ref 3.5–5.1)
Sodium: 139 mmol/L (ref 135–145)
Total Bilirubin: 0.3 mg/dL (ref 0.0–1.2)
Total Protein: 6.9 g/dL (ref 6.5–8.1)

## 2024-07-06 MED ORDER — DEXAMETHASONE SODIUM PHOSPHATE 10 MG/ML IJ SOLN
10.0000 mg | Freq: Once | INTRAMUSCULAR | Status: AC
  Administered 2024-07-06: 10 mg via INTRAVENOUS
  Filled 2024-07-06: qty 1

## 2024-07-06 MED ORDER — SODIUM CHLORIDE 0.9% FLUSH: 10.0000 mL | INTRAVENOUS | Status: DC | PRN

## 2024-07-06 MED ORDER — DIPHENHYDRAMINE HCL 50 MG/ML IJ SOLN
50.0000 mg | Freq: Once | INTRAMUSCULAR | Status: AC
  Administered 2024-07-06: 50 mg via INTRAVENOUS
  Filled 2024-07-06: qty 1

## 2024-07-06 MED ORDER — FAMOTIDINE IN NACL 20-0.9 MG/50ML-% IV SOLN
20.0000 mg | Freq: Once | INTRAVENOUS | Status: AC
  Administered 2024-07-06: 20 mg via INTRAVENOUS
  Filled 2024-07-06: qty 50

## 2024-07-06 MED ORDER — SODIUM CHLORIDE 0.9 % IV SOLN
45.0000 mg/m2 | Freq: Once | INTRAVENOUS | Status: AC
  Administered 2024-07-06: 78 mg via INTRAVENOUS
  Filled 2024-07-06: qty 13

## 2024-07-06 MED ORDER — SODIUM CHLORIDE 0.9 % IV SOLN: INTRAVENOUS | Status: DC

## 2024-07-06 MED ORDER — SODIUM CHLORIDE 0.9% FLUSH
10.0000 mL | Freq: Once | INTRAVENOUS | Status: AC
  Administered 2024-07-06: 10 mL

## 2024-07-06 NOTE — Progress Notes (Signed)
 Pt completed paclitaxel  tx today with no issues. Pt complained of port pain 4/10 after deaccessing, but no other pain reported. Treatment tolerated well, VSS, and ambulatory to lobby.

## 2024-07-06 NOTE — Patient Instructions (Signed)
 [facility] - A DEPT OF [facility]   Discharge Instructions: Thank you for choosing [facility] to provide your oncology and hematology care.   If you have a lab appointment with the Cancer Center, please go directly to the Cancer Center and check in at the registration area.   Wear comfortable clothing and clothing appropriate for easy access to any Portacath or PICC line.   We strive to give you quality time with your provider. You may need to reschedule your appointment if you arrive late (15 or more minutes).  Arriving late affects you and other patients whose appointments are after yours.  Also, if you miss three or more appointments without notifying the office, you may be dismissed from the clinic at the provider's discretion.      For prescription refill requests, have your pharmacy contact our office and allow 72 hours for refills to be completed.    Today you received the following chemotherapy and/or immunotherapy agents: Paclitaxel (Taxol)      To help prevent nausea and vomiting after your treatment, we encourage you to take your nausea medication as directed.  BELOW ARE SYMPTOMS THAT SHOULD BE REPORTED IMMEDIATELY: *FEVER GREATER THAN 100.4 F (38 C) OR HIGHER *CHILLS OR SWEATING *NAUSEA AND VOMITING THAT IS NOT CONTROLLED WITH YOUR NAUSEA MEDICATION *UNUSUAL SHORTNESS OF BREATH *UNUSUAL BRUISING OR BLEEDING *URINARY PROBLEMS (pain or burning when urinating, or frequent urination) *BOWEL PROBLEMS (unusual diarrhea, constipation, pain near the anus) TENDERNESS IN MOUTH AND THROAT WITH OR WITHOUT PRESENCE OF ULCERS (sore throat, sores in mouth, or a toothache) UNUSUAL RASH, SWELLING OR PAIN  UNUSUAL VAGINAL DISCHARGE OR ITCHING   Items with * indicate a potential emergency and should be followed up as soon as possible or go to the Emergency Department if any problems should occur.  Please show the CHEMOTHERAPY ALERT CARD or  IMMUNOTHERAPY ALERT CARD at check-in to the Emergency Department and triage nurse.  Should you have questions after your visit or need to cancel or reschedule your appointment, please contact [facility] - A DEPT OF [facility]  Dept: [phone]  and follow the prompts.  Office hours are 8:00 a.m. to 4:30 p.m. Monday - Friday. Please note that voicemails left after 4:00 p.m. may not be returned until the following business day.  We are closed weekends and major holidays. You have access to a nurse at all times for urgent questions. Please call the main number to the clinic Dept: [phone] and follow the prompts.   For any non-urgent questions, you may also contact your provider using MyChart. We now offer e-Visits for anyone 76 and older to request care online for non-urgent symptoms. For details visit [email or web address].   Also download the MyChart app! Go to the app store, search "MyChart", open the app, select [facility], and log in with your MyChart username and password.

## 2024-07-06 NOTE — Progress Notes (Signed)
 [facility] Cancer Follow up:    Center, [facility] [address] [place] [address]   DIAGNOSIS:  Cancer Staging  Malignant neoplasm of upper-outer quadrant of left breast in female, estrogen receptor positive (HCC) Staging form: Breast, AJCC 8th Edition - Clinical: Stage IIA (cT2, cN1, cM0, G1, ER+, PR+, HER2-) - Signed by Odean Potts, MD on 03/30/2024 Stage prefix: Initial diagnosis Histologic grading system: 3 grade system    SUMMARY OF ONCOLOGIC HISTORY: Oncology History  Malignant neoplasm of upper-outer quadrant of left breast in female, estrogen receptor positive (HCC)  03/22/2024 Initial Diagnosis   Screening mammogram detected 2 masses in the left breast UOQ.  Mammogram measured 1.3 cm and 1.1 cm.  Contrast-enhanced mammogram was performed which revealed additional smaller masses (2.4 cm and 1.1 cm) spanning 7 x 3 cm.  Ultrasound measured the mass at 1.6 cm and 0.8 cm along with 2 enlarged axillary lymph nodes 1 of which was biopsy positive.  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   03/30/2024 Cancer Staging   Staging form: Breast, AJCC 8th Edition - Clinical: Stage IIA (cT2, cN1, cM0, G1, ER+, PR+, HER2-) - Signed by Odean Potts, MD on 03/30/2024 Stage prefix: Initial diagnosis Histologic grading system: 3 grade system   04/06/2024 -  Chemotherapy   Patient is on Treatment Plan : BREAST DOSE DENSE AC q14d / PACLitaxel  q7d       CURRENT THERAPY: Taxol  week 6  INTERVAL HISTORY:  Discussed the use of AI scribe software for clinical note transcription with the patient, who gave verbal consent to proceed.  History of Present Illness Wanda Garcia is a 48 year old female with stage IIA ER/PR positive breast cancer who presents for follow-up evaluation prior to receiving neoadjuvant chemotherapy.  She is accompanied by an Arabic interpreter.   She has completed four cycles of Adriamycin  and Cytoxan  and is  currently on week six of weekly Taxol  therapy. She experiences fatigue and numbness and tingling in her fingertips that is intermittent. She has been dose reduced on her taxol  since last week, down to 45 mg/m2.  She notes the neuropathy is unchanged since the reduction.   No fever, chills, or shortness of breath. She has had a cough for the past three months, which she attributes to air conditioning. She frequently feels cold.  She drinks coffee one to two times a day and is considering non-caffeinated teas at night to aid sleep.     Patient Active Problem List   Diagnosis Date Noted   Port-A-Cath in place 04/06/2024   Malignant neoplasm of upper-outer quadrant of left breast in female, estrogen receptor positive (HCC) 03/28/2024   Neck pain 07/09/2018   Trigger finger of left thumb 07/09/2018   Pain in left wrist 07/09/2018   Pain in right wrist 07/09/2018   S/P cesarean section 04/05/2017   Advanced maternal age, primigravida in first trimester, antepartum     is allergic to pork-derived products.  MEDICAL HISTORY: Past Medical History:  Diagnosis Date   Breast cancer (HCC)    left 03/2024    SURGICAL HISTORY: Past Surgical History:  Procedure Laterality Date   BREAST BIOPSY Left 03/2024   CESAREAN SECTION N/A 04/06/2017   Procedure: CESAREAN SECTION;  Surgeon: Herchel Gloris LABOR, MD;  Location: [facility];  Service: Obstetrics;  Laterality: N/A;   CHOLECYSTECTOMY N/A 04/26/2019   Procedure: LAPAROSCOPIC CHOLECYSTECTOMY WITH POSSIBLE INTRAOPERATIVE CHOLANGIOGRAM;  Surgeon: Vernetta Berg, MD;  Location: [facility] OR;  Service: General;  Laterality: N/A;   PORTACATH PLACEMENT N/A 04/04/2024   Procedure: INSERTION, TUNNELED CENTRAL VENOUS DEVICE, WITH PORT;  Surgeon: Aron Shoulders, MD;  Location: [facility] OR;  Service: General;  Laterality: N/A;    SOCIAL HISTORY: Social History   Socioeconomic History   Marital status: Married    Spouse name: Not on file   Number of  children: Not on file   Years of education: Not on file   Highest education level: Not on file  Occupational History   Not on file  Tobacco Use   Smoking status: Every Day    Types: Cigarettes   Smokeless tobacco: Never   Tobacco comments:    smokes 0-2 / day  Vaping Use   Vaping status: Never Used  Substance and Sexual Activity   Alcohol use: No   Drug use: No   Sexual activity: Yes    Birth control/protection: None  Other Topics Concern   Not on file  Social History Narrative   Not on file   Social Drivers of Health   Financial Resource Strain: Not on file  Food Insecurity: No Food Insecurity (03/30/2024)   Hunger Vital Sign    Worried About Running Out of Food in the Last Year: Never true    Ran Out of Food in the Last Year: Never true  Transportation Needs: Unmet Transportation Needs (03/30/2024)   PRAPARE - Administrator, Civil Service (Medical): Yes    Lack of Transportation (Non-Medical): No  Physical Activity: Not on file  Stress: Not on file  Social Connections: Not on file  Intimate Partner Violence: Patient Unable To Answer (03/30/2024)   Humiliation, Afraid, Rape, and Kick questionnaire    Fear of Current or Ex-Partner: Patient unable to answer    Emotionally Abused: Patient unable to answer    Physically Abused: Patient unable to answer    Sexually Abused: Patient unable to answer    FAMILY HISTORY: Family History  Problem Relation Age of Onset   Diabetes Father    Hypertension Father    Breast cancer Neg Hx     Review of Systems  Constitutional:  Positive for fatigue. Negative for appetite change, chills, fever and unexpected weight change.  HENT:   Negative for hearing loss, lump/mass and trouble swallowing.   Eyes:  Negative for eye problems and icterus.  Respiratory:  Negative for chest tightness, cough and shortness of breath.   Cardiovascular:  Negative for chest pain, leg swelling and palpitations.  Gastrointestinal:  Negative  for abdominal distention, abdominal pain, constipation, diarrhea, nausea and vomiting.  Endocrine: Negative for hot flashes.  Genitourinary:  Negative for difficulty urinating.   Musculoskeletal:  Negative for arthralgias.  Skin:  Negative for itching and rash.  Neurological:  Positive for numbness. Negative for dizziness, extremity weakness and headaches.  Hematological:  Negative for adenopathy. Does not bruise/bleed easily.  Psychiatric/Behavioral:  Positive for sleep disturbance. Negative for depression. The patient is not nervous/anxious.       PHYSICAL EXAMINATION     Vitals:   07/06/24 0914  BP: 130/82  Pulse: 92  Resp: 16  Temp: 98.1 F (36.7 C)  SpO2: 100%    Physical Exam Constitutional:      General: She is not in acute distress.    Appearance: Normal appearance. She is not toxic-appearing.  HENT:     Head: Normocephalic and atraumatic.     Mouth/Throat:     Mouth: Mucous membranes are  moist.     Pharynx: Oropharynx is clear. No oropharyngeal exudate or posterior oropharyngeal erythema.  Eyes:     General: No scleral icterus. Cardiovascular:     Rate and Rhythm: Normal rate and regular rhythm.     Pulses: Normal pulses.     Heart sounds: Normal heart sounds.  Pulmonary:     Effort: Pulmonary effort is normal.     Breath sounds: Normal breath sounds.  Abdominal:     General: Abdomen is flat. Bowel sounds are normal. There is no distension.     Palpations: Abdomen is soft.     Tenderness: There is no abdominal tenderness.  Musculoskeletal:        General: No swelling.     Cervical back: Neck supple.  Lymphadenopathy:     Cervical: No cervical adenopathy.  Skin:    General: Skin is warm and dry.     Findings: No rash.  Neurological:     General: No focal deficit present.     Mental Status: She is alert.  Psychiatric:        Mood and Affect: Mood normal.        Behavior: Behavior normal.     LABORATORY DATA:  CBC    Component Value Date/Time    WBC 5.2 07/06/2024 0842   WBC 6.8 04/25/2019 1341   RBC 4.31 07/06/2024 0842   HGB 11.2 (L) 07/06/2024 0842   HGB 12.7 06/25/2018 1507   HCT 35.2 (L) 07/06/2024 0842   HCT 43.3 06/25/2018 1507   PLT 330 07/06/2024 0842   PLT 369 06/25/2018 1507   MCV 81.7 07/06/2024 0842   MCV 79 06/25/2018 1507   MCH 26.0 07/06/2024 0842   MCHC 31.8 07/06/2024 0842   RDW 17.2 (H) 07/06/2024 0842   RDW 14.4 06/25/2018 1507   LYMPHSABS 1.2 07/06/2024 0842   MONOABS 0.4 07/06/2024 0842   EOSABS 0.2 07/06/2024 0842   BASOSABS 0.0 07/06/2024 0842    CMP     Component Value Date/Time   NA 139 07/06/2024 0842   NA 142 06/25/2018 1507   K 4.0 07/06/2024 0842   CL 108 07/06/2024 0842   CO2 26 07/06/2024 0842   GLUCOSE 123 (H) 07/06/2024 0842   BUN 9 07/06/2024 0842   BUN 10 06/25/2018 1507   CREATININE 0.43 (L) 07/06/2024 0842   CALCIUM 9.0 07/06/2024 0842   PROT 6.9 07/06/2024 0842   PROT 7.0 06/25/2018 1507   ALBUMIN 4.2 07/06/2024 0842   ALBUMIN 4.6 06/25/2018 1507   AST 22 07/06/2024 0842   ALT 42 07/06/2024 0842   ALKPHOS 69 07/06/2024 0842   BILITOT 0.3 07/06/2024 0842   GFRNONAA >60 07/06/2024 0842   GFRAA >60 03/23/2019 1550     ASSESSMENT and THERAPY PLAN:   Malignant neoplasm of upper-outer quadrant of left breast in female, estrogen receptor positive (HCC) 03/22/2024:Screening mammogram detected 2 masses in the left breast UOQ.  Mammogram measured 1.3 cm and 1.1 cm.  Contrast-enhanced mammogram was performed which revealed additional smaller masses (2.4 cm and 1.1 cm) spanning 7 x 3 cm.  Ultrasound measured the mass at 1.6 cm and 0.8 cm along with 2 enlarged axillary lymph nodes 1 of which was biopsy positive.  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    Treatment plan Neoadjuvant chemotherapy with dose dense Adriamycin  and Cytoxan  every 2 weeks x 4 followed by Taxol  weekly x 12 Breast conserving surgery with sentinel lymph node biopsy  and targeted node  dissection Adjuvant radiation therapy Adjuvant antiestrogen therapy CT CAP 04/06/2024: Left breast mass, left axillary lymph nodes, right hepatic lesion Liver MRI 04/11/2024: Liver hemangioma: Benign ------------------------------------------------------------------------------------------------------- Current Treatment: Completed 4 cycles of dose dense Adriamycin  and Cytoxan , today is cycle 6 Taxol  ECHO EF 60-65%   Assessment and Plan Assessment & Plan Stage 2A ER/PR positive breast cancer undergoing neoadjuvant chemotherapy Stage 2A ER/PR positive breast cancer on neoadjuvant chemotherapy. Completed four cycles of andromycin and cytoxan , currently on week six of Taxol  therapy. Labs normal, no new symptoms contraindicating chemotherapy. - Continue weekly Taxol  therapy as per current regimen.  Chemotherapy-induced peripheral neuropathy Experiencing numbness and tingling in fingertips, grade 2 and unchanged from last week.  Current dose reduction maintained. - Maintain current dose reduction as per Doctor Gudena's previous adjustment.  Sleep Pattern Disturbance.  Declines taking Xanax , Benadryl , Melatonin. - Reviewed sleep hygiene, recommended she try chamomile tea.   RTC in 1 week for labs, f/u, and her next treatment.     All questions were answered. The patient knows to call the clinic with any problems, questions or concerns. We can certainly see the patient much sooner if necessary. 20 minutes*in face-to-face visit time, chart review, lab review, care coordination, order entry, and documentation of the encounter time.    Morna Kendall, NP 07/08/24 6:00 PM Medical Oncology and Hematology [facility] [address] [place], [place] [address] Tel. [phone]    Fax. [phone]  *Total Encounter Time as defined by the Centers for Medicare and Medicaid Services includes, in addition to the face-to-face time of a patient visit (documented in the note above)  non-face-to-face time: obtaining and reviewing outside history, ordering and reviewing medications, tests or procedures, care coordination (communications with other health care professionals or caregivers) and documentation in the medical record.

## 2024-07-08 ENCOUNTER — Encounter: Payer: Self-pay | Admitting: Hematology and Oncology

## 2024-07-08 NOTE — Assessment & Plan Note (Signed)
 03/22/2024:Screening mammogram detected 2 masses in the left breast UOQ.  Mammogram measured 1.3 cm and 1.1 cm.  Contrast-enhanced mammogram was performed which revealed additional smaller masses (2.4 cm and 1.1 cm) spanning 7 x 3 cm.  Ultrasound measured the mass at 1.6 cm and 0.8 cm along with 2 enlarged axillary lymph nodes 1 of which was biopsy positive.  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    Treatment plan Neoadjuvant chemotherapy with dose dense Adriamycin  and Cytoxan  every 2 weeks x 4 followed by Taxol  weekly x 12 Breast conserving surgery with sentinel lymph node biopsy and targeted node dissection Adjuvant radiation therapy Adjuvant antiestrogen therapy CT CAP 04/06/2024: Left breast mass, left axillary lymph nodes, right hepatic lesion Liver MRI 04/11/2024: Liver hemangioma: Benign ------------------------------------------------------------------------------------------------------- Current Treatment: Completed 4 cycles of dose dense Adriamycin  and Cytoxan , today is cycle 6 Taxol  ECHO EF 60-65%   Assessment and Plan Assessment & Plan Stage 2A ER/PR positive breast cancer undergoing neoadjuvant chemotherapy Stage 2A ER/PR positive breast cancer on neoadjuvant chemotherapy. Completed four cycles of andromycin and cytoxan , currently on week six of Taxol  therapy. Labs normal, no new symptoms contraindicating chemotherapy. - Continue weekly Taxol  therapy as per current regimen.  Chemotherapy-induced peripheral neuropathy Experiencing numbness and tingling in fingertips, grade 2 and unchanged from last week.  Current dose reduction maintained. - Maintain current dose reduction as per Doctor Gudena's previous adjustment.  Sleep Pattern Disturbance.  Declines taking Xanax , Benadryl , Melatonin. - Reviewed sleep hygiene, recommended she try chamomile tea.   RTC in 1 week for labs, f/u, and her next treatment.

## 2024-07-13 ENCOUNTER — Inpatient Hospital Stay

## 2024-07-13 ENCOUNTER — Inpatient Hospital Stay (HOSPITAL_BASED_OUTPATIENT_CLINIC_OR_DEPARTMENT_OTHER): Admitting: Hematology and Oncology

## 2024-07-13 ENCOUNTER — Encounter: Payer: Self-pay | Admitting: *Deleted

## 2024-07-13 VITALS — BP 125/76 | HR 84 | Temp 98.0°F | Resp 16

## 2024-07-13 VITALS — BP 120/86 | HR 88 | Temp 98.4°F | Resp 18 | Ht 60.0 in | Wt 167.1 lb

## 2024-07-13 DIAGNOSIS — Z5111 Encounter for antineoplastic chemotherapy: Secondary | ICD-10-CM | POA: Diagnosis not present

## 2024-07-13 DIAGNOSIS — Z170 Estrogen receptor positive status [ER+]: Secondary | ICD-10-CM

## 2024-07-13 DIAGNOSIS — C50412 Malignant neoplasm of upper-outer quadrant of left female breast: Secondary | ICD-10-CM

## 2024-07-13 DIAGNOSIS — Z95828 Presence of other vascular implants and grafts: Secondary | ICD-10-CM

## 2024-07-13 LAB — CMP (CANCER CENTER ONLY)
ALT: 26 U/L (ref 0–44)
AST: 15 U/L (ref 15–41)
Albumin: 4 g/dL (ref 3.5–5.0)
Alkaline Phosphatase: 67 U/L (ref 38–126)
Anion gap: 6 (ref 5–15)
BUN: 10 mg/dL (ref 6–20)
CO2: 26 mmol/L (ref 22–32)
Calcium: 8.9 mg/dL (ref 8.9–10.3)
Chloride: 108 mmol/L (ref 98–111)
Creatinine: 0.48 mg/dL (ref 0.44–1.00)
GFR, Estimated: >60 mL/min (ref >60)
Glucose, Bld: 127 mg/dL — ABNORMAL HIGH (ref 70–99)
Potassium: 3.8 mmol/L (ref 3.5–5.1)
Sodium: 140 mmol/L (ref 135–145)
Total Bilirubin: 0.2 mg/dL (ref 0.0–1.2)
Total Protein: 6.5 g/dL (ref 6.5–8.1)

## 2024-07-13 LAB — CBC WITH DIFFERENTIAL (CANCER CENTER ONLY)
Abs Immature Granulocytes: 0.02 K/uL (ref 0.00–0.07)
Basophils Absolute: 0 K/uL (ref 0.0–0.1)
Basophils Relative: 1 %
Eosinophils Absolute: 0.2 K/uL (ref 0.0–0.5)
Eosinophils Relative: 4 %
HCT: 33.8 % — ABNORMAL LOW (ref 36.0–46.0)
Hemoglobin: 11 g/dL — ABNORMAL LOW (ref 12.0–15.0)
Immature Granulocytes: 0 %
Lymphocytes Relative: 23 %
Lymphs Abs: 1 K/uL (ref 0.7–4.0)
MCH: 26.7 pg (ref 26.0–34.0)
MCHC: 32.5 g/dL (ref 30.0–36.0)
MCV: 82 fL (ref 80.0–100.0)
Monocytes Absolute: 0.4 K/uL (ref 0.1–1.0)
Monocytes Relative: 9 %
Neutro Abs: 2.8 K/uL (ref 1.7–7.7)
Neutrophils Relative %: 63 %
Platelet Count: 380 K/uL (ref 150–400)
RBC: 4.12 MIL/uL (ref 3.87–5.11)
RDW: 16 % — ABNORMAL HIGH (ref 11.5–15.5)
WBC Count: 4.5 K/uL (ref 4.0–10.5)
nRBC: 0 % (ref 0.0–0.2)

## 2024-07-13 LAB — GENETIC SCREENING ORDER

## 2024-07-13 MED ORDER — DEXAMETHASONE SODIUM PHOSPHATE 10 MG/ML IJ SOLN
10.0000 mg | Freq: Once | INTRAMUSCULAR | Status: AC
  Administered 2024-07-13: 10 mg via INTRAVENOUS
  Filled 2024-07-13: qty 1

## 2024-07-13 MED ORDER — FAMOTIDINE IN NACL 20-0.9 MG/50ML-% IV SOLN
20.0000 mg | Freq: Once | INTRAVENOUS | Status: AC
  Administered 2024-07-13: 20 mg via INTRAVENOUS
  Filled 2024-07-13: qty 50

## 2024-07-13 MED ORDER — SODIUM CHLORIDE 0.9% FLUSH
10.0000 mL | INTRAVENOUS | Status: DC | PRN
  Administered 2024-07-13: 10 mL

## 2024-07-13 MED ORDER — SODIUM CHLORIDE 0.9 % IV SOLN
45.0000 mg/m2 | Freq: Once | INTRAVENOUS | Status: AC
  Administered 2024-07-13: 78 mg via INTRAVENOUS
  Filled 2024-07-13: qty 13

## 2024-07-13 MED ORDER — DIPHENHYDRAMINE HCL 50 MG/ML IJ SOLN
50.0000 mg | Freq: Once | INTRAMUSCULAR | Status: AC
  Administered 2024-07-13: 50 mg via INTRAVENOUS
  Filled 2024-07-13: qty 1

## 2024-07-13 MED ORDER — SODIUM CHLORIDE 0.9 % IV SOLN: INTRAVENOUS | Status: DC

## 2024-07-13 MED ORDER — SODIUM CHLORIDE 0.9% FLUSH
10.0000 mL | Freq: Once | INTRAVENOUS | Status: AC
  Administered 2024-07-13: 10 mL

## 2024-07-13 NOTE — Progress Notes (Signed)
 Patient Care Team: Center, [facility] as PCP - Diedre Rigg, Stephane BROCKS, RN (Inactive) as Oncology Nurse Navigator Tyree Nanetta SAILOR, RN as Oncology Nurse Navigator Odean Potts, MD as Consulting Physician (Hematology and Oncology) Aron Shoulders, MD as Consulting Physician (General Surgery) Izell Domino, MD as Attending Physician (Radiation Oncology)  DIAGNOSIS:  Encounter Diagnosis  Name Primary?   Malignant neoplasm of upper-outer quadrant of left breast in female, estrogen receptor positive (HCC) Yes    SUMMARY OF ONCOLOGIC HISTORY: Oncology History  Malignant neoplasm of upper-outer quadrant of left breast in female, estrogen receptor positive (HCC)  03/22/2024 Initial Diagnosis   Screening mammogram detected 2 masses in the left breast UOQ.  Mammogram measured 1.3 cm and 1.1 cm.  Contrast-enhanced mammogram was performed which revealed additional smaller masses (2.4 cm and 1.1 cm) spanning 7 x 3 cm.  Ultrasound measured the mass at 1.6 cm and 0.8 cm along with 2 enlarged axillary lymph nodes 1 of which was biopsy positive.  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   03/30/2024 Cancer Staging   Staging form: Breast, AJCC 8th Edition - Clinical: Stage IIA (cT2, cN1, cM0, G1, ER+, PR+, HER2-) - Signed by Odean Potts, MD on 03/30/2024 Stage prefix: Initial diagnosis Histologic grading system: 3 grade system   04/06/2024 -  Chemotherapy   Patient is on Treatment Plan : BREAST DOSE DENSE AC q14d / PACLitaxel  q7d       CHIEF COMPLIANT: Cycle 7 Taxol   HISTORY OF PRESENT ILLNESS:   History of Present Illness Wanda Garcia is a 48 year old female with neuropathy who presents for follow-up regarding her cancer treatment with Taxol .  She experiences persistent neuropathy with 'pins and needles' sensations in her legs and fingertips, accompanied by leg pain and a sensation of heaviness, especially during ambulation. These symptoms are bilateral and  consistent throughout the day.  She is currently on her seventh cycle of chemotherapy with Taxol . Transportation for her upcoming radiation therapy sessions has been arranged.     ALLERGIES:  is allergic to pork-derived products.  MEDICATIONS:  Current Outpatient Medications  Medication Sig Dispense Refill   ALPRAZolam  (XANAX ) 0.25 MG tablet Take 0.25 mg by mouth at bedtime as needed for anxiety.     Cyanocobalamin (B-12 PO) Take 1 tablet by mouth once a week. (Patient not taking: Reported on 06/30/2024)     lidocaine -prilocaine  (EMLA ) cream Apply to affected area once (Patient not taking: Reported on 06/30/2024) 30 g 3   Omega-3 Fatty Acids (OMEGA 3 PO) Take 1 tablet by mouth once a week. (Patient not taking: Reported on 06/30/2024)     ondansetron  (ZOFRAN ) 8 MG tablet Take 1 tab (8 mg) by mouth every 8 hrs as needed for nausea/vomiting. Start third day after doxorubicin /cyclophosphamide  chemotherapy. (Patient not taking: Reported on 06/30/2024) 30 tablet 1   oxyCODONE  (OXY IR/ROXICODONE ) 5 MG immediate release tablet Take 1 tablet (5 mg total) by mouth every 6 (six) hours as needed for severe pain (pain score 7-10). (Patient not taking: Reported on 06/30/2024) 10 tablet 0   prochlorperazine  (COMPAZINE ) 10 MG tablet Take 1 tablet (10 mg total) by mouth every 6 (six) hours as needed for nausea or vomiting. (Patient not taking: Reported on 06/30/2024) 30 tablet 1   No current facility-administered medications for this visit.    PHYSICAL EXAMINATION: ECOG PERFORMANCE STATUS: 1 - Symptomatic but completely ambulatory  Vitals:   07/13/24 0856  BP: 120/86  Pulse: 88  Resp: 18  Temp: 98.4 F (36.9 C)  SpO2: 100%   Filed Weights   07/13/24 0856  Weight: 167 lb 1.6 oz (75.8 kg)    Physical Exam NEUROLOGICAL: Sensation intact in legs bilaterally.  (exam performed in the presence of a chaperone)  LABORATORY DATA:  I have reviewed the data as listed    Latest Ref Rng & Units 07/13/2024     8:13 AM 07/06/2024    8:42 AM 06/30/2024    8:30 AM  CMP  Glucose 70 - 99 mg/dL 872  876  894   BUN 6 - 20 mg/dL 10  9  10    Creatinine 0.44 - 1.00 mg/dL 9.51  9.56  9.48   Sodium 135 - 145 mmol/L 140  139  140   Potassium 3.5 - 5.1 mmol/L 3.8  4.0  4.0   Chloride 98 - 111 mmol/L 108  108  108   CO2 22 - 32 mmol/L 26  26  27    Calcium 8.9 - 10.3 mg/dL 8.9  9.0  9.1   Total Protein 6.5 - 8.1 g/dL 6.5  6.9  6.9   Total Bilirubin 0.0 - 1.2 mg/dL 0.2  0.3  0.3   Alkaline Phos 38 - 126 U/L 67  69  78   AST 15 - 41 U/L 15  22  25    ALT 0 - 44 U/L 26  42  72     Lab Results  Component Value Date   WBC 4.5 07/13/2024   HGB 11.0 (L) 07/13/2024   HCT 33.8 (L) 07/13/2024   MCV 82.0 07/13/2024   PLT 380 07/13/2024   NEUTROABS 2.8 07/13/2024    ASSESSMENT & PLAN:  Malignant neoplasm of upper-outer quadrant of left breast in female, estrogen receptor positive (HCC) 03/22/2024:Screening mammogram detected 2 masses in the left breast UOQ.  Mammogram measured 1.3 cm and 1.1 cm.  Contrast-enhanced mammogram was performed which revealed additional smaller masses (2.4 cm and 1.1 cm) spanning 7 x 3 cm.  Ultrasound measured the mass at 1.6 cm and 0.8 cm along with 2 enlarged axillary lymph nodes 1 of which was biopsy positive.  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    Treatment plan Neoadjuvant chemotherapy with dose dense Adriamycin  and Cytoxan  every 2 weeks x 4 followed by Taxol  weekly x 12 Breast conserving surgery with sentinel lymph node biopsy and targeted node dissection Adjuvant radiation therapy Adjuvant antiestrogen therapy CT CAP 04/06/2024: Left breast mass, left axillary lymph nodes, right hepatic lesion Liver MRI 04/11/2024: Liver hemangioma: Benign ------------------------------------------------------------------------------------------------------- Current Treatment: Completed 4 cycles of dose dense Adriamycin  and Cytoxan , today is cycle 7 Taxol  ECHO EF  60-65%   Chemo Toxicities:  Severe fatigue: Stable Anemia secondary to chemotherapy: Monitoring closely today's hemoglobin is 10.6 Peripheral 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  We reduced the dosage of Taxol  once again to 45 mg/m.  Plan is to treat her for 8 cycles of Taxol  only. I requested a breast MRI and a follow-up with Dr. Aron  RTC weekly for chemo  ------------------------------------- Assessment and Plan Assessment & Plan Malignant neoplasm of upper-outer quadrant of left breast Undergoing chemotherapy for estrogen receptor-positive breast cancer. Planned transition to surgical intervention post-chemotherapy. MRI scheduled to evaluate disease extent for surgical planning. Surgery explained as common and simple. Port removal planned during surgery. - Order MRI of the breast next week. - Refer to surgeon for surgical evaluation and planning. - Arrange for port  removal during surgery.  Chemotherapy-induced peripheral neuropathy Persistent bilateral pain and paresthesia in legs and fingertips due to chemotherapy. Symptoms stable, managed as part of ongoing treatment.      Orders Placed This Encounter  Procedures   MR BREAST BILATERAL W WO CONTRAST INC CAD    Standing Status:   Future    Expected Date:   07/21/2024    Expiration Date:   07/13/2025    If indicated for the ordered procedure, I authorize the administration of contrast media per Radiology protocol:   Yes    What is the patient's sedation requirement?:   No Sedation    Does the patient have a pacemaker or implanted devices?:   No    Radiology Contrast Protocol - do NOT remove file path:   [email or web address]    Preferred imaging location?:   [facility] (table limit - 500lbs)    Release to patient:   Immediate   The patient has a good understanding of the overall plan. she agrees with it. she will call with any problems that may develop before the next visit  here. Total time spent: 30 mins including face to face time and time spent for planning, charting and co-ordination of care   Naomi MARLA Chad, MD 07/13/24

## 2024-07-13 NOTE — Assessment & Plan Note (Signed)
 03/22/2024:Screening mammogram detected 2 masses in the left breast UOQ.  Mammogram measured 1.3 cm and 1.1 cm.  Contrast-enhanced mammogram was performed which revealed additional smaller masses (2.4 cm and 1.1 cm) spanning 7 x 3 cm.  Ultrasound measured the mass at 1.6 cm and 0.8 cm along with 2 enlarged axillary lymph nodes 1 of which was biopsy positive.  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    Treatment plan Neoadjuvant chemotherapy with dose dense Adriamycin  and Cytoxan  every 2 weeks x 4 followed by Taxol  weekly x 12 Breast conserving surgery with sentinel lymph node biopsy and targeted node dissection Adjuvant radiation therapy Adjuvant antiestrogen therapy CT CAP 04/06/2024: Left breast mass, left axillary lymph nodes, right hepatic lesion Liver MRI 04/11/2024: Liver hemangioma: Benign ------------------------------------------------------------------------------------------------------- Current Treatment: Completed 4 cycles of dose dense Adriamycin  and Cytoxan , today is cycle 7 Taxol  ECHO EF 60-65%   Chemo Toxicities:  Severe fatigue: Stable Anemia secondary to chemotherapy: Monitoring closely today's hemoglobin is 10.6 Peripheral 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  We reduced the dosage of Taxol  once again to 45 mg/m.  Plan is to treat her for 8 cycles of Taxol  only.     RTC weekly for chemo

## 2024-07-13 NOTE — Patient Instructions (Signed)
 [facility] - A DEPT OF [facility]   Discharge Instructions: Thank you for choosing [facility] to provide your oncology and hematology care.   If you have a lab appointment with the Cancer Center, please go directly to the Cancer Center and check in at the registration area.   Wear comfortable clothing and clothing appropriate for easy access to any Portacath or PICC line.   We strive to give you quality time with your provider. You may need to reschedule your appointment if you arrive late (15 or more minutes).  Arriving late affects you and other patients whose appointments are after yours.  Also, if you miss three or more appointments without notifying the office, you may be dismissed from the clinic at the provider's discretion.      For prescription refill requests, have your pharmacy contact our office and allow 72 hours for refills to be completed.    Today you received the following chemotherapy and/or immunotherapy agents: Paclitaxel (Taxol)      To help prevent nausea and vomiting after your treatment, we encourage you to take your nausea medication as directed.  BELOW ARE SYMPTOMS THAT SHOULD BE REPORTED IMMEDIATELY: *FEVER GREATER THAN 100.4 F (38 C) OR HIGHER *CHILLS OR SWEATING *NAUSEA AND VOMITING THAT IS NOT CONTROLLED WITH YOUR NAUSEA MEDICATION *UNUSUAL SHORTNESS OF BREATH *UNUSUAL BRUISING OR BLEEDING *URINARY PROBLEMS (pain or burning when urinating, or frequent urination) *BOWEL PROBLEMS (unusual diarrhea, constipation, pain near the anus) TENDERNESS IN MOUTH AND THROAT WITH OR WITHOUT PRESENCE OF ULCERS (sore throat, sores in mouth, or a toothache) UNUSUAL RASH, SWELLING OR PAIN  UNUSUAL VAGINAL DISCHARGE OR ITCHING   Items with * indicate a potential emergency and should be followed up as soon as possible or go to the Emergency Department if any problems should occur.  Please show the CHEMOTHERAPY ALERT CARD or  IMMUNOTHERAPY ALERT CARD at check-in to the Emergency Department and triage nurse.  Should you have questions after your visit or need to cancel or reschedule your appointment, please contact [facility] - A DEPT OF [facility]  Dept: [phone]  and follow the prompts.  Office hours are 8:00 a.m. to 4:30 p.m. Monday - Friday. Please note that voicemails left after 4:00 p.m. may not be returned until the following business day.  We are closed weekends and major holidays. You have access to a nurse at all times for urgent questions. Please call the main number to the clinic Dept: [phone] and follow the prompts.   For any non-urgent questions, you may also contact your provider using MyChart. We now offer e-Visits for anyone 76 and older to request care online for non-urgent symptoms. For details visit [email or web address].   Also download the MyChart app! Go to the app store, search "MyChart", open the app, select [facility], and log in with your MyChart username and password.

## 2024-07-14 ENCOUNTER — Encounter: Payer: Self-pay | Admitting: *Deleted

## 2024-07-20 ENCOUNTER — Inpatient Hospital Stay

## 2024-07-20 ENCOUNTER — Inpatient Hospital Stay: Attending: Licensed Clinical Social Worker

## 2024-07-20 ENCOUNTER — Inpatient Hospital Stay: Admitting: Hematology and Oncology

## 2024-07-20 VITALS — BP 120/84 | HR 93 | Temp 98.0°F | Resp 18 | Ht 60.0 in | Wt 166.9 lb

## 2024-07-20 DIAGNOSIS — C50412 Malignant neoplasm of upper-outer quadrant of left female breast: Secondary | ICD-10-CM | POA: Insufficient documentation

## 2024-07-20 DIAGNOSIS — G629 Polyneuropathy, unspecified: Secondary | ICD-10-CM | POA: Insufficient documentation

## 2024-07-20 DIAGNOSIS — Z1721 Progesterone receptor positive status: Secondary | ICD-10-CM | POA: Insufficient documentation

## 2024-07-20 DIAGNOSIS — Z1732 Human epidermal growth factor receptor 2 negative status: Secondary | ICD-10-CM | POA: Diagnosis not present

## 2024-07-20 DIAGNOSIS — T451X5A Adverse effect of antineoplastic and immunosuppressive drugs, initial encounter: Secondary | ICD-10-CM | POA: Diagnosis not present

## 2024-07-20 DIAGNOSIS — Z170 Estrogen receptor positive status [ER+]: Secondary | ICD-10-CM

## 2024-07-20 DIAGNOSIS — Z95828 Presence of other vascular implants and grafts: Secondary | ICD-10-CM

## 2024-07-20 HISTORY — DX: Polyneuropathy, unspecified: G62.9

## 2024-07-20 LAB — CBC WITH DIFFERENTIAL (CANCER CENTER ONLY)
Abs Immature Granulocytes: 0.02 K/uL (ref 0.00–0.07)
Basophils Absolute: 0 K/uL (ref 0.0–0.1)
Basophils Relative: 1 %
Eosinophils Absolute: 0.2 K/uL (ref 0.0–0.5)
Eosinophils Relative: 4 %
HCT: 34.2 % — ABNORMAL LOW (ref 36.0–46.0)
Hemoglobin: 11.1 g/dL — ABNORMAL LOW (ref 12.0–15.0)
Immature Granulocytes: 1 %
Lymphocytes Relative: 23 %
Lymphs Abs: 1 K/uL (ref 0.7–4.0)
MCH: 26.6 pg (ref 26.0–34.0)
MCHC: 32.5 g/dL (ref 30.0–36.0)
MCV: 81.8 fL (ref 80.0–100.0)
Monocytes Absolute: 0.4 K/uL (ref 0.1–1.0)
Monocytes Relative: 9 %
Neutro Abs: 2.7 K/uL (ref 1.7–7.7)
Neutrophils Relative %: 62 %
Platelet Count: 391 K/uL (ref 150–400)
RBC: 4.18 MIL/uL (ref 3.87–5.11)
RDW: 15.3 % (ref 11.5–15.5)
WBC Count: 4.3 K/uL (ref 4.0–10.5)
nRBC: 0 % (ref 0.0–0.2)

## 2024-07-20 LAB — CMP (CANCER CENTER ONLY)
ALT: 24 U/L (ref 0–44)
AST: 16 U/L (ref 15–41)
Albumin: 4.1 g/dL (ref 3.5–5.0)
Alkaline Phosphatase: 62 U/L (ref 38–126)
Anion gap: 5 (ref 5–15)
BUN: 10 mg/dL (ref 6–20)
CO2: 28 mmol/L (ref 22–32)
Calcium: 9.2 mg/dL (ref 8.9–10.3)
Chloride: 107 mmol/L (ref 98–111)
Creatinine: 0.48 mg/dL (ref 0.44–1.00)
GFR, Estimated: >60 mL/min (ref >60)
Glucose, Bld: 107 mg/dL — ABNORMAL HIGH (ref 70–99)
Potassium: 3.8 mmol/L (ref 3.5–5.1)
Sodium: 140 mmol/L (ref 135–145)
Total Bilirubin: 0.3 mg/dL (ref 0.0–1.2)
Total Protein: 7.1 g/dL (ref 6.5–8.1)

## 2024-07-20 LAB — PREGNANCY, URINE: Preg Test, Ur: NEGATIVE

## 2024-07-20 MED ORDER — SODIUM CHLORIDE 0.9% FLUSH
10.0000 mL | Freq: Once | INTRAVENOUS | Status: AC
  Administered 2024-07-20: 10 mL

## 2024-07-20 NOTE — Progress Notes (Signed)
 Patient Care Team: [facility], [facility] as PCP - Diedre Rigg, Stephane BROCKS, RN (Inactive) as Oncology Nurse Navigator Tyree Nanetta SAILOR, RN as Oncology Nurse Navigator Odean Potts, MD as Consulting Physician (Hematology and Oncology) Aron Shoulders, MD as Consulting Physician (General Surgery) Izell Domino, MD as Attending Physician (Radiation Oncology)  DIAGNOSIS:  Encounter Diagnosis  Name Primary?   Malignant neoplasm of upper-outer quadrant of left breast in female, estrogen receptor positive (HCC) Yes    SUMMARY OF ONCOLOGIC HISTORY: Oncology History  Malignant neoplasm of upper-outer quadrant of left breast in female, estrogen receptor positive (HCC)  03/22/2024 Initial Diagnosis   Screening mammogram detected 2 masses in the left breast UOQ.  Mammogram measured 1.3 cm and 1.1 cm.  Contrast-enhanced mammogram was performed which revealed additional smaller masses (2.4 cm and 1.1 cm) spanning 7 x 3 cm.  Ultrasound measured the mass at 1.6 cm and 0.8 cm along with 2 enlarged axillary lymph nodes 1 of which was biopsy positive.  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   03/30/2024 Cancer Staging   Staging form: Breast, AJCC 8th Edition - Clinical: Stage IIA (cT2, cN1, cM0, G1, ER+, PR+, HER2-) - Signed by Odean Potts, MD on 03/30/2024 Stage prefix: Initial diagnosis Histologic grading system: 3 grade system   04/06/2024 -  Chemotherapy   Patient is on Treatment Plan : BREAST DOSE DENSE AC q14d / PACLitaxel  q7d       CHIEF COMPLIANT: Cycle 8 Taxol  (being discontinued for peripheral neuropathy)  HISTORY OF PRESENT ILLNESS:  History of Present Illness Wanda Garcia is a 48 year old female undergoing chemotherapy for cancer who presents with severe peripheral neuropathy.  She experiences severe peripheral neuropathy in her fingers and toes, significantly affecting her ability to hold objects and use her hands. She also stumbles while walking,  indicating balance and coordination issues.  She has completed the majority of her chemotherapy regimen, including the first four cycles, but her neuropathy has worsened over time. She is concerned about further deterioration of her symptoms despite completing most of the treatment.  Her caregiver is worried about the impact of discontinuing chemotherapy on her cancer treatment. There is a discussion about potential surgery and future treatments to reduce cancer recurrence risk. She inquires about using vitamins to help with her symptoms.     ALLERGIES:  is allergic to pork-derived products.  MEDICATIONS:  Current Outpatient Medications  Medication Sig Dispense Refill   ALPRAZolam  (XANAX ) 0.25 MG tablet Take 0.25 mg by mouth at bedtime as needed for anxiety.     Cyanocobalamin (B-12 PO) Take 1 tablet by mouth once a week. (Patient not taking: Reported on 06/30/2024)     lidocaine -prilocaine  (EMLA ) cream Apply to affected area once (Patient not taking: Reported on 06/30/2024) 30 g 3   Omega-3 Fatty Acids (OMEGA 3 PO) Take 1 tablet by mouth once a week. (Patient not taking: Reported on 06/30/2024)     ondansetron  (ZOFRAN ) 8 MG tablet Take 1 tab (8 mg) by mouth every 8 hrs as needed for nausea/vomiting. Start third day after doxorubicin /cyclophosphamide  chemotherapy. (Patient not taking: Reported on 06/30/2024) 30 tablet 1   oxyCODONE  (OXY IR/ROXICODONE ) 5 MG immediate release tablet Take 1 tablet (5 mg total) by mouth every 6 (six) hours as needed for severe pain (pain score 7-10). (Patient not taking: Reported on 06/30/2024) 10 tablet 0   prochlorperazine  (COMPAZINE ) 10 MG tablet Take 1 tablet (10 mg total) by mouth every 6 (  six) hours as needed for nausea or vomiting. (Patient not taking: Reported on 06/30/2024) 30 tablet 1   No current facility-administered medications for this visit.    PHYSICAL EXAMINATION: ECOG PERFORMANCE STATUS: 1 - Symptomatic but completely ambulatory  Vitals:    07/20/24 0917  BP: 120/84  Pulse: 93  Resp: 18  Temp: 98 F (36.7 C)  SpO2: 100%   Filed Weights   07/20/24 0917  Weight: 166 lb 14.4 oz (75.7 kg)      LABORATORY DATA:  I have reviewed the data as listed    Latest Ref Rng & Units 07/13/2024    8:13 AM 07/06/2024    8:42 AM 06/30/2024    8:30 AM  CMP  Glucose 70 - 99 mg/dL 872  876  894   BUN 6 - 20 mg/dL 10  9  10    Creatinine 0.44 - 1.00 mg/dL 9.51  9.56  9.48   Sodium 135 - 145 mmol/L 140  139  140   Potassium 3.5 - 5.1 mmol/L 3.8  4.0  4.0   Chloride 98 - 111 mmol/L 108  108  108   CO2 22 - 32 mmol/L 26  26  27    Calcium 8.9 - 10.3 mg/dL 8.9  9.0  9.1   Total Protein 6.5 - 8.1 g/dL 6.5  6.9  6.9   Total Bilirubin 0.0 - 1.2 mg/dL 0.2  0.3  0.3   Alkaline Phos 38 - 126 U/L 67  69  78   AST 15 - 41 U/L 15  22  25    ALT 0 - 44 U/L 26  42  72     Lab Results  Component Value Date   WBC 4.3 07/20/2024   HGB 11.1 (L) 07/20/2024   HCT 34.2 (L) 07/20/2024   MCV 81.8 07/20/2024   PLT 391 07/20/2024   NEUTROABS 2.7 07/20/2024    ASSESSMENT & PLAN:  Malignant neoplasm of upper-outer quadrant of left breast in female, estrogen receptor positive (HCC) 03/22/2024:Screening mammogram detected 2 masses in the left breast UOQ.  Mammogram measured 1.3 cm and 1.1 cm.  Contrast-enhanced mammogram was performed which revealed additional smaller masses (2.4 cm and 1.1 cm) spanning 7 x 3 cm.  Ultrasound measured the mass at 1.6 cm and 0.8 cm along with 2 enlarged axillary lymph nodes 1 of which was biopsy positive.  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    Treatment plan Neoadjuvant chemotherapy with dose dense Adriamycin  and Cytoxan  every 2 weeks x 4 followed by Taxol  weekly x 12 Breast conserving surgery versus mastectomy based upon breast MRI with sentinel lymph node biopsy and targeted node dissection Adjuvant radiation therapy Adjuvant antiestrogen therapy CT CAP 04/06/2024: Left breast mass, left axillary  lymph nodes, right hepatic lesion Liver MRI 04/11/2024: Liver hemangioma: Benign ------------------------------------------------------------------------------------------------------- Current Treatment: Completed 4 cycles of dose dense Adriamycin  and Cytoxan , today is cycle 8 Taxol  (discontinuing chemo due to toxicities especially neuropathy) ECHO EF 60-65%   Severe peripheral neuropathy: With discontinuation of chemotherapy, recommend that she exercise her hands daily as well as take B complex vitamin supplements. I requested a breast MRI and a follow-up with Dr. Aron  Return to clinic after surgery to discuss final pathology report      No orders of the defined types were placed in this encounter.  The patient has a good understanding of the overall plan. she agrees with it. she will call with any problems that  may develop before the next visit here. Total time spent: 30 mins including face to face time and time spent for planning, charting and co-ordination of care   Viinay K Trenden Hazelrigg, MD 07/20/24

## 2024-07-20 NOTE — Assessment & Plan Note (Signed)
 03/22/2024:Screening mammogram detected 2 masses in the left breast UOQ.  Mammogram measured 1.3 cm and 1.1 cm.  Contrast-enhanced mammogram was performed which revealed additional smaller masses (2.4 cm and 1.1 cm) spanning 7 x 3 cm.  Ultrasound measured the mass at 1.6 cm and 0.8 cm along with 2 enlarged axillary lymph nodes 1 of which was biopsy positive.  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    Treatment plan Neoadjuvant chemotherapy with dose dense Adriamycin  and Cytoxan  every 2 weeks x 4 followed by Taxol  weekly x 12 Breast conserving surgery with sentinel lymph node biopsy and targeted node dissection Adjuvant radiation therapy Adjuvant antiestrogen therapy CT CAP 04/06/2024: Left breast mass, left axillary lymph nodes, right hepatic lesion Liver MRI 04/11/2024: Liver hemangioma: Benign ------------------------------------------------------------------------------------------------------- Current Treatment: Completed 4 cycles of dose dense Adriamycin  and Cytoxan , today is cycle 8 Taxol  ECHO EF 60-65%   Chemo Toxicities:  Severe fatigue: Stable Anemia secondary to chemotherapy: Monitoring closely today's hemoglobin is 10.6 Peripheral 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  We reduced the dosage of Taxol  once again to 45 mg/m. Plan is to treat her for 8 cycles of Taxol  only. I requested a breast MRI and a follow-up with Dr. Aron  RTC weekly for chemo

## 2024-07-21 ENCOUNTER — Ambulatory Visit
Admission: RE | Admit: 2024-07-21 | Discharge: 2024-07-21 | Disposition: A | Discharge status: Home / Self Care | Source: Ambulatory Visit | Attending: Hematology and Oncology | Admitting: Hematology and Oncology

## 2024-07-21 DIAGNOSIS — Z170 Estrogen receptor positive status [ER+]: Secondary | ICD-10-CM

## 2024-07-21 MED ORDER — GADOPICLENOL 0.5 MMOL/ML IV SOLN
7.0000 mL | Freq: Once | INTRAVENOUS | Status: AC | PRN
  Administered 2024-07-21: 7 mL via INTRAVENOUS

## 2024-07-25 ENCOUNTER — Telehealth: Payer: Self-pay

## 2024-07-25 NOTE — Telephone Encounter (Signed)
 Pt's husband called to find out MRI results and ask about when surgery would be scheduled. Pt is scheduled to see [facility] 9/15 to discuss results and plan for surgery. Advised pt's husband of this and made him aware results are not back. He knows to call with any concerns and that [facility] will review results with pt.

## 2024-07-26 ENCOUNTER — Encounter: Payer: Self-pay | Admitting: *Deleted

## 2024-07-26 ENCOUNTER — Telehealth: Payer: Self-pay | Admitting: *Deleted

## 2024-07-26 NOTE — Telephone Encounter (Signed)
 Note from 9/8- Called reading room to request MRI results to be read asap.

## 2024-07-27 ENCOUNTER — Other Ambulatory Visit

## 2024-07-27 ENCOUNTER — Ambulatory Visit: Admitting: Hematology and Oncology

## 2024-07-27 ENCOUNTER — Ambulatory Visit

## 2024-08-01 ENCOUNTER — Other Ambulatory Visit: Payer: Self-pay | Admitting: General Surgery

## 2024-08-01 DIAGNOSIS — C50412 Malignant neoplasm of upper-outer quadrant of left female breast: Secondary | ICD-10-CM

## 2024-08-03 ENCOUNTER — Ambulatory Visit: Admitting: Hematology and Oncology

## 2024-08-03 ENCOUNTER — Other Ambulatory Visit

## 2024-08-03 ENCOUNTER — Ambulatory Visit

## 2024-08-04 ENCOUNTER — Encounter: Payer: Self-pay | Admitting: *Deleted

## 2024-08-04 DIAGNOSIS — Z170 Estrogen receptor positive status [ER+]: Secondary | ICD-10-CM

## 2024-08-05 ENCOUNTER — Encounter: Payer: Self-pay | Admitting: Hematology and Oncology

## 2024-08-08 ENCOUNTER — Telehealth: Payer: Self-pay | Admitting: Radiation Oncology

## 2024-08-08 NOTE — Telephone Encounter (Signed)
 9/22 @ 11:21 am Called patient's husband contract number no answer voicemail not set up yet. Try again at later time to get patient sch for consult.

## 2024-08-08 NOTE — Telephone Encounter (Signed)
 9/22 @ 3:22 pm Called patient's husband contract number, just rings/voicemail not set up yet.

## 2024-08-10 ENCOUNTER — Ambulatory Visit

## 2024-08-10 ENCOUNTER — Ambulatory Visit: Admitting: Hematology and Oncology

## 2024-08-10 ENCOUNTER — Other Ambulatory Visit

## 2024-08-16 NOTE — Progress Notes (Signed)
 Surgical Instructions   Your procedure is scheduled on Tuesday, October 7th, 2025. Report to [facility] Main Entrance A at 10:30 A.M., then check in with the Admitting office. Any questions or running late day of surgery: call [phone]  Questions prior to your surgery date: call [phone], Monday-Friday, 8am-4pm. If you experience any cold or flu symptoms such as cough, fever, chills, shortness of breath, etc. between now and your scheduled surgery, please notify us  at the above number.     Remember:  Do not eat after midnight the night before your surgery   You may drink clear liquids until 9:30 the morning of your surgery.   Clear liquids allowed are: Water, Non-Citrus Juices (without pulp), Carbonated Beverages, Clear Tea (no milk, honey, etc.), Black Coffee Only (NO MILK, CREAM OR POWDERED CREAMER of any kind), and Gatorade.    Take these medicines the morning of surgery with A SIP OF WATER: None.    May take these medicines IF NEEDED: None.      One week prior to surgery, STOP taking any Aspirin (unless otherwise instructed by your surgeon) Aleve, Naproxen, Ibuprofen , Motrin , Advil , Goody's, BC's, all herbal medications, fish oil, and non-prescription vitamins.                     Do NOT Smoke (Tobacco/Vaping) for 24 hours prior to your procedure.  If you use a CPAP at night, you may bring your mask/headgear for your overnight stay.   You will be asked to remove any contacts, glasses, piercing's, hearing aid's, dentures/partials prior to surgery. Please bring cases for these items if needed.    Patients discharged the day of surgery will not be allowed to drive home, and someone needs to stay with them for 24 hours.  SURGICAL WAITING ROOM VISITATION Patients may have no more than 2 support people in the waiting area - these visitors may rotate.   Pre-op nurse will coordinate an appropriate time for 1 ADULT support person, who may not rotate, to accompany patient  in pre-op.  Children under the age of 23 must have an adult with them who is not the patient and must remain in the main waiting area with an adult.  If the patient needs to stay at the hospital during part of their recovery, the visitor guidelines for inpatient rooms apply.  Please refer to the [facility] website for the visitor guidelines for any additional information.   If you received a COVID test during your pre-op visit  it is requested that you wear a mask when out in public, stay away from anyone that may not be feeling well and notify your surgeon if you develop symptoms. If you have been in contact with anyone that has tested positive in the last 10 days please notify you surgeon.      Pre-operative CHG Bathing Instructions   You can play a key role in reducing the risk of infection after surgery. Your skin needs to be as free of germs as possible. You can reduce the number of germs on your skin by washing with CHG (chlorhexidine  gluconate) soap before surgery. CHG is an antiseptic soap that kills germs and continues to kill germs even after washing.   DO NOT use if you have an allergy to chlorhexidine /CHG or antibacterial soaps. If your skin becomes reddened or irritated, stop using the CHG and notify one of our RNs at [phone].  TAKE A SHOWER THE NIGHT BEFORE SURGERY AND THE DAY OF SURGERY    Please keep in mind the following:  DO NOT shave, including legs and underarms, 48 hours prior to surgery.   You may shave your face before/day of surgery.  Place clean sheets on your bed the night before surgery Use a clean washcloth (not used since being washed) for each shower. DO NOT sleep with pet's night before surgery.  CHG Shower Instructions:  Wash your face and private area with normal soap. If you choose to wash your hair, wash first with your normal shampoo.  After you use shampoo/soap, rinse your hair and body thoroughly to remove shampoo/soap residue.   Turn the water OFF and apply half the bottle of CHG soap to a CLEAN washcloth.  Apply CHG soap ONLY FROM YOUR NECK DOWN TO YOUR TOES (washing for 3-5 minutes)  DO NOT use CHG soap on face, private areas, open wounds, or sores.  Pay special attention to the area where your surgery is being performed.  If you are having back surgery, having someone wash your back for you may be helpful. Wait 2 minutes after CHG soap is applied, then you may rinse off the CHG soap.  Pat dry with a clean towel  Put on clean pajamas    Additional instructions for the day of surgery: DO NOT APPLY any lotions, deodorants, cologne, or perfumes.   Do not wear jewelry or makeup Do not wear nail polish, gel polish, artificial nails, or any other type of covering on natural nails (fingers and toes) Do not bring valuables to the hospital. [facility] is not responsible for valuables/personal belongings. Put on clean/comfortable clothes.  Please brush your teeth.  Ask your nurse before applying any prescription medications to the skin.

## 2024-08-17 ENCOUNTER — Other Ambulatory Visit: Payer: Self-pay

## 2024-08-17 ENCOUNTER — Encounter (HOSPITAL_COMMUNITY)
Admission: RE | Admit: 2024-08-17 | Discharge: 2024-08-17 | Disposition: A | Discharge status: Home / Self Care | Source: Ambulatory Visit | Attending: General Surgery | Admitting: General Surgery

## 2024-08-17 ENCOUNTER — Encounter (HOSPITAL_COMMUNITY): Payer: Self-pay

## 2024-08-17 VITALS — BP 121/75 | HR 87 | Temp 98.5°F | Resp 20 | Ht 60.0 in | Wt 169.3 lb

## 2024-08-17 DIAGNOSIS — Z01818 Encounter for other preprocedural examination: Secondary | ICD-10-CM

## 2024-08-17 DIAGNOSIS — Z01812 Encounter for preprocedural laboratory examination: Secondary | ICD-10-CM | POA: Diagnosis not present

## 2024-08-17 DIAGNOSIS — Z9221 Personal history of antineoplastic chemotherapy: Secondary | ICD-10-CM | POA: Diagnosis not present

## 2024-08-17 HISTORY — DX: Headache, unspecified: R51.9

## 2024-08-17 HISTORY — DX: Unspecified osteoarthritis, unspecified site: M19.90

## 2024-08-17 HISTORY — DX: Depression, unspecified: F32.A

## 2024-08-17 LAB — BASIC METABOLIC PANEL WITH GFR
Anion gap: 11 (ref 5–15)
BUN: 12 mg/dL (ref 6–20)
CO2: 23 mmol/L (ref 22–32)
Calcium: 9.2 mg/dL (ref 8.9–10.3)
Chloride: 103 mmol/L (ref 98–111)
Creatinine, Ser: 0.55 mg/dL (ref 0.44–1.00)
GFR, Estimated: >60 mL/min (ref >60)
Glucose, Bld: 103 mg/dL — ABNORMAL HIGH (ref 70–99)
Potassium: 3.7 mmol/L (ref 3.5–5.1)
Sodium: 137 mmol/L (ref 135–145)

## 2024-08-17 LAB — CBC
HCT: 43 % (ref 36.0–46.0)
Hemoglobin: 13.5 g/dL (ref 12.0–15.0)
MCH: 26.3 pg (ref 26.0–34.0)
MCHC: 31.4 g/dL (ref 30.0–36.0)
MCV: 83.7 fL (ref 80.0–100.0)
Platelets: 350 K/uL (ref 150–400)
RBC: 5.14 MIL/uL — ABNORMAL HIGH (ref 3.87–5.11)
RDW: 12.3 % (ref 11.5–15.5)
WBC: 5.3 K/uL (ref 4.0–10.5)
nRBC: 0 % (ref 0.0–0.2)

## 2024-08-17 LAB — POCT PREGNANCY, URINE: Preg Test, Ur: NEGATIVE

## 2024-08-17 NOTE — Progress Notes (Addendum)
 PCP - [facility] Cardiologist - denies  PPM/ICD - denies Device Orders - n/a Rep Notified - n/a  Chest x-ray - 04/08/2024 EKG - 03/24/2019 (no need for a new per [place] , [place]) Stress Test -denies  ECHO - 04/05/24 Cardiac Cath - denies  Sleep Study - denies CPAP - n/a  Fasting Blood Sugar - no DM Checks Blood Sugar _____ times a day  Last dose of GLP1 agonist-  n/a GLP1 instructions: n/a  Blood Thinner Instructions: n/a Aspirin Instructions: n/a  ERAS Protcol - yes PRE-SURGERY Ensure or G2- no  COVID TEST- n/a   Anesthesia review: yes, seed placement on 08/18/2024 (UPT neg on 08/17/2024)  Patient denies shortness of breath, fever, cough and chest pain at PAT appointment   All instructions explained to the patient, with a verbal understanding of the material. Patient agrees to go over the instructions while at home for a better understanding. Patient also instructed to self quarantine after being tested for COVID-19. The opportunity to ask questions was provided.

## 2024-08-22 NOTE — H&P (Signed)
 PROVIDER: JINA CLAIR NEPHEW, MD Patient Care Team: Theotis Ohm, NP as PCP - General (Family Medicine) NEPHEW JINA CLAIR, MD as Consulting Provider (Surgical Oncology) Izell Lauraine Norris, MD (Radiation Oncology) Gudena, Vinay K, MD (Hematology and Oncology) Jama Dorothe RAMAN, MD (Radiology)  MRN: [id number] DOB: 18-Oct-1976 DATE OF ENCOUNTER: 08/01/2024  Chief Complaint: Discuss breast surgery   History of Present Illness: Wanda Garcia is a 48 y.o. female who is seen today for breast cancer follow-up.  Initial history:   Patient presented with a new diagnosis of left breast cancer in May 2025. Patient had a screening detected mass. She underwent baseline screening mammogram and had focal asymmetry on the left. Diagnostic imaging was performed. This was at [facility]. She had a 1.1 cm irregular mass in the upper outer breast at 2:00 6 cm from the nipple. There was also a 1.3 cm irregular mass with obscured margins and architectural distortions of calcifications at 3:00 10 cm from the nipple. Contrast-enhanced mammogram and ultrasound were performed. Ultrasound confirmed these masses and demonstrated that the 3:00 mass was larger at 2.2 cm. It also showed an abnormal lymph node. The contrast-enhanced mammogram showed multiple additional 2 to 5 mm enhancing masses in a segmental distribution in between and anterior to the 2 masses that measured 7.5 cm in greatest dimension but was 3.3 cm from craniocaudal and 3 cm transversely. 7.5 cm was in the anterior to posterior dimension. The 2 lymph nodes demonstrated enhancement. One of the lymph nodes was biopsied and the 2 larger masses were biopsied. The showed grade 1 invasive ductal carcinoma. The lymph node also was positive for carcinoma with no residual lymphoid tissue. Days was ER and PR strongly positive HER2 was negative and Ki-67 was 15%.    Accompanied at first visit 03/2024 by husband and arabic interpreter. Has autistic  child which complicates treatment.   Interval history:   History of Present Illness Wanda Garcia is a 48 year old female with breast cancer who presents for surgical consultation regarding lumpectomy and lymph node biopsy.  She is currently undergoing chemotherapy and has completed the [facility] portion. She had to abandon the remaining taxol  due to toxicities. 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   Recent MRI results showed that the breast tumors decreased in size with the enhancement in between going from around 7.5 cm to around 5.5 cm, and the lymph nodes in the armpit are now described as more normal in size.  She inquires about the potential for swelling in her arm post-surgery, which is noted to have a low risk of occurrence. She has a history of easy infections, having had a previous infection after a lipoma removal.  Breast MRI 07/21/2024 IMPRESSION: 1. Evidence of some interval improvement following neoadjuvant chemotherapy. Based on the pre biopsy ultrasound of the left breast, the more posterior mass, at 3 o'clock, 6 cm the nipple, measured 2.2 x 1.4 x 1.6 cm, currently 2.1 x 1.1 cm transversely. The mass at 2 o'clock, 5 cm the nipple measured 1.1 x 0.6 x 0.7 cm, currently 7 mm in longest dimension. The metastatic lymph node measured proximally 7 mm in short axis on the ultrasound, currently 6 mm. 2. Two small masses and hazy non mass enhancement lie between the 2 biopsy proven left breast carcinomas, with the overall span of abnormal enhancement being 5.2 cm anterior-posterior. 3. No other evidence of left breast malignancy. 4. No evidence of right breast malignancy. 5. No  current enlarged/abnormal axillary lymph nodes. RECOMMENDATION: 1. Treatment as planned for the known left breast multifocal carcinoma and metastatic left axillary lymph node. BI-RADS CATEGORY 6: Known biopsy-proven malignancy.  ADDENDUM Patient had a contrast  enhanced mammography performed at [facility] on 03/22/2024. On that exam, the largest mass measured 2.2 x 1.7 cm compared to the current measurement, 2.1 x 1.1 cm. This was the more posterior mass. The more anterior mass on the contrast enhanced mammography measured 9 mm, currently 7 mm. Previous overall span of abnormal enhancement/enhancing masses from the contrast enhanced mammography was 7.5 cm, currently 5.2 cm. This would also indicate a positive response to neoadjuvant chemotherapy.   Review of Systems: A complete review of systems was obtained from the patient. I have reviewed this information and discussed as appropriate with the patient. See HPI as well for other ROS.  Review of Systems  Skin:  Nail issues from chemo  All other systems reviewed and are negative.   Medical History: History reviewed. No pertinent past medical history.  Patient Active Problem List  Diagnosis  Malignant neoplasm of upper-outer quadrant of left breast in female, estrogen receptor positive (CMS/HHS-HCC)  Breast cancer metastasized to axillary lymph node, left (CMS/HHS-HCC)   Past Surgical History:  Procedure Laterality Date  CESAREAN SECTION N/A 04/06/2017  CHOLECYSTECTOMY N/A 04/26/2019    No Known Allergies  Current Outpatient Medications on File Prior to Visit  Medication Sig Dispense Refill  oxyCODONE  (ROXICODONE ) 5 MG immediate release tablet Take 5 mg by mouth  cetirizine  (ZYRTEC ) 10 MG tablet Take 1 tablet by mouth once daily (Patient not taking: Reported on 08/01/2024)   No current facility-administered medications on file prior to visit.   Family History  Problem Relation Age of Onset  High blood pressure (Hypertension) Father  Diabetes Father    Social History   Tobacco Use  Smoking Status Never  Smokeless Tobacco Never    Social History   Socioeconomic History  Marital status: Married  Tobacco Use  Smoking status: Never  Smokeless tobacco: Never  Substance and  Sexual Activity  Alcohol use: Never  Drug use: Never   Social Drivers of Health   Food Insecurity: No Food Insecurity (03/30/2024)  Received from [facility]  Hunger Vital Sign  Within the past 12 months, you worried that your food would run out before you got the money to buy more.: Never true  Within the past 12 months, the food you bought just didn't last and you didn't have money to get more.: Never true  Transportation Needs: Unmet Transportation Needs (03/30/2024)  Received from [facility] - Transportation  Lack of Transportation (Medical): Yes  Lack of Transportation (Non-Medical): No   Objective:   Vitals:  08/01/24 0945  Pulse: (!) 111  Temp: 36.8 C (98.2 F)  SpO2: 99%  Weight: 76.8 kg (169 lb 6.4 oz)  Height: 152.4 cm (5')  PainSc: 4   Body mass index is 33.08 kg/m.  Head: Normocephalic and atraumatic. Alopecia from chemo Eyes: Conjunctivae are normal. Pupils are equal, round, and reactive to light. No scleral icterus.  Neck: Normal range of motion. Neck supple. No tracheal deviation present. No thyromegaly present.  Resp: No respiratory distress, normal effort. Breast: Similar to prechemo. No palpable masses. Mild ptosis. Relatively symmetric in size. No palpable lymphadenopathy or nipple discharge. Port-A-Cath in place left chest. Abd: Abdomen is soft, non distended and non tender. No masses are palpable. There is no rebound and no guarding.  Neurological: Alert and  oriented to person, place, and time. Coordination normal.  Skin: Skin is warm and dry. No rash noted. No diaphoretic. No erythema. No pallor.  Psychiatric: Normal mood and affect. Normal behavior. Judgment and thought content normal.   Labs, Imaging and Diagnostic Testing:  Labs drawn September 3 include a c-Met which was essentially normal and a CBC with a slight anemia with hematocrit of 34.2.  Assessment and Plan:   Diagnoses and all orders for this visit:  Malignant neoplasm of  upper-outer quadrant of left breast in female, estrogen receptor positive (CMS/HHS-HCC)  Breast cancer metastasized to axillary lymph node, left (CMS/HHS-HCC)   Assessment & Plan Left breast cancer (upper-outer quadrant) with axillary lymph node metastasis Both small tumors reduced in size with total distance of enhancement from 7.5 cm to 5.5 cm post-chemotherapy. Axillary lymph nodes decreased in size. We reviewed her images at breast conference and it was felt that lumpectomy feasible, potentially avoiding mastectomy. - Perform seed bracketed lumpectomy and sentinel lymph node biopsy - Post-surgical radiation therapy for 4-5 weeks, Monday through Friday. - Prescribe anti-hormonal therapy for 5-10 years, duration determined by algorithm. - Administer vitamin B6 for neuropathy in fingers. - Schedule preoperative visit and radiology appointment for seed placement. - Fit for post-op bra to reduce swelling. - Advise no lifting over 10 pounds for 1-2 weeks post-surgery and no showering for 2 days post-surgery. - Arrange follow-up visit 2 weeks post-surgery. - Refer to physical therapy postoperatively.

## 2024-08-23 ENCOUNTER — Ambulatory Visit (HOSPITAL_COMMUNITY): Payer: Self-pay | Admitting: Physician Assistant

## 2024-08-23 ENCOUNTER — Encounter (HOSPITAL_COMMUNITY): Payer: Self-pay | Admitting: General Surgery

## 2024-08-23 ENCOUNTER — Ambulatory Visit (HOSPITAL_COMMUNITY)
Admission: RE | Admit: 2024-08-23 | Discharge: 2024-08-23 | Disposition: A | Discharge status: Home / Self Care | Attending: General Surgery | Admitting: General Surgery

## 2024-08-23 ENCOUNTER — Other Ambulatory Visit (HOSPITAL_COMMUNITY): Payer: Self-pay

## 2024-08-23 ENCOUNTER — Other Ambulatory Visit: Payer: Self-pay

## 2024-08-23 ENCOUNTER — Ambulatory Visit (HOSPITAL_COMMUNITY)
Admission: RE | Admit: 2024-08-23 | Discharge: 2024-08-23 | Disposition: A | Discharge status: Home / Self Care | Source: Ambulatory Visit | Attending: General Surgery | Admitting: General Surgery

## 2024-08-23 ENCOUNTER — Ambulatory Visit (HOSPITAL_BASED_OUTPATIENT_CLINIC_OR_DEPARTMENT_OTHER)

## 2024-08-23 ENCOUNTER — Encounter: Payer: Self-pay | Admitting: Hematology and Oncology

## 2024-08-23 ENCOUNTER — Encounter (HOSPITAL_COMMUNITY)
Admission: RE | Disposition: A | Discharge status: Home / Self Care | Payer: Self-pay | Source: Home / Self Care | Attending: General Surgery

## 2024-08-23 DIAGNOSIS — Z1721 Progesterone receptor positive status: Secondary | ICD-10-CM | POA: Diagnosis not present

## 2024-08-23 DIAGNOSIS — Z1732 Human epidermal growth factor receptor 2 negative status: Secondary | ICD-10-CM | POA: Diagnosis not present

## 2024-08-23 DIAGNOSIS — T451X5A Adverse effect of antineoplastic and immunosuppressive drugs, initial encounter: Secondary | ICD-10-CM | POA: Insufficient documentation

## 2024-08-23 DIAGNOSIS — C50912 Malignant neoplasm of unspecified site of left female breast: Secondary | ICD-10-CM

## 2024-08-23 DIAGNOSIS — Z9221 Personal history of antineoplastic chemotherapy: Secondary | ICD-10-CM | POA: Diagnosis not present

## 2024-08-23 DIAGNOSIS — G622 Polyneuropathy due to other toxic agents: Secondary | ICD-10-CM | POA: Insufficient documentation

## 2024-08-23 DIAGNOSIS — F32A Depression, unspecified: Secondary | ICD-10-CM | POA: Diagnosis not present

## 2024-08-23 DIAGNOSIS — C50412 Malignant neoplasm of upper-outer quadrant of left female breast: Secondary | ICD-10-CM | POA: Insufficient documentation

## 2024-08-23 DIAGNOSIS — Z170 Estrogen receptor positive status [ER+]: Secondary | ICD-10-CM | POA: Insufficient documentation

## 2024-08-23 DIAGNOSIS — Z818 Family history of other mental and behavioral disorders: Secondary | ICD-10-CM | POA: Diagnosis not present

## 2024-08-23 DIAGNOSIS — C773 Secondary and unspecified malignant neoplasm of axilla and upper limb lymph nodes: Secondary | ICD-10-CM | POA: Diagnosis not present

## 2024-08-23 DIAGNOSIS — Z452 Encounter for adjustment and management of vascular access device: Secondary | ICD-10-CM | POA: Diagnosis not present

## 2024-08-23 HISTORY — PX: AXILLARY SENTINEL NODE BIOPSY: SHX5738

## 2024-08-23 HISTORY — PX: PORT-A-CATH REMOVAL: SHX5289

## 2024-08-23 SURGERY — LUMPECTOMY WITH MAGNETIC MARKER LOCALIZATION
Anesthesia: Regional | Site: Chest

## 2024-08-23 MED ORDER — ORAL CARE MOUTH RINSE: 15.0000 mL | Freq: Once | OROMUCOSAL | Status: AC

## 2024-08-23 MED ORDER — PROPOFOL 10 MG/ML IV BOLUS
INTRAVENOUS | Status: AC
  Filled 2024-08-23: qty 20

## 2024-08-23 MED ORDER — IBUPROFEN 600 MG PO TABS
600.0000 mg | ORAL_TABLET | Freq: Three times a day (TID) | ORAL | 1 refills | Status: DC | PRN
  Filled 2024-08-23: qty 60, 20d supply, fill #0

## 2024-08-23 MED ORDER — CEFAZOLIN SODIUM-DEXTROSE 2-4 GM/100ML-% IV SOLN
2.0000 g | INTRAVENOUS | Status: AC
  Administered 2024-08-23: 2 g via INTRAVENOUS
  Filled 2024-08-23: qty 100

## 2024-08-23 MED ORDER — OXYCODONE HCL 5 MG PO TABS
ORAL_TABLET | ORAL | Status: AC
  Filled 2024-08-23: qty 1

## 2024-08-23 MED ORDER — FENTANYL CITRATE (PF) 100 MCG/2ML IJ SOLN
INTRAMUSCULAR | Status: AC
  Filled 2024-08-23: qty 2

## 2024-08-23 MED ORDER — CHLORHEXIDINE GLUCONATE CLOTH 2 % EX PADS: 6.0000 | MEDICATED_PAD | Freq: Once | CUTANEOUS | Status: DC

## 2024-08-23 MED ORDER — OXYCODONE HCL 5 MG/5ML PO SOLN: 5.0000 mg | Freq: Once | ORAL | Status: AC | PRN

## 2024-08-23 MED ORDER — LIDOCAINE HCL (PF) 1 % IJ SOLN
INTRAMUSCULAR | Status: AC
  Filled 2024-08-23: qty 30

## 2024-08-23 MED ORDER — LACTATED RINGERS IV SOLN: INTRAVENOUS | Status: DC

## 2024-08-23 MED ORDER — LIDOCAINE HCL 1 % IJ SOLN
INTRAMUSCULAR | Status: DC | PRN
  Administered 2024-08-23: 12 mL

## 2024-08-23 MED ORDER — CHLORHEXIDINE GLUCONATE 0.12 % MT SOLN
15.0000 mL | Freq: Once | OROMUCOSAL | Status: AC
  Administered 2024-08-23: 15 mL via OROMUCOSAL
  Filled 2024-08-23: qty 15

## 2024-08-23 MED ORDER — DEXAMETHASONE SODIUM PHOSPHATE 10 MG/ML IJ SOLN
INTRAMUSCULAR | Status: DC | PRN
  Administered 2024-08-23: 4 mg via INTRAVENOUS

## 2024-08-23 MED ORDER — MIDAZOLAM HCL 2 MG/2ML IJ SOLN
INTRAMUSCULAR | Status: DC | PRN
  Administered 2024-08-23: 2 mg via INTRAVENOUS

## 2024-08-23 MED ORDER — MIDAZOLAM HCL 2 MG/2ML IJ SOLN
INTRAMUSCULAR | Status: AC
  Filled 2024-08-23: qty 2

## 2024-08-23 MED ORDER — ONDANSETRON HCL 4 MG/2ML IJ SOLN: 4.0000 mg | Freq: Once | INTRAMUSCULAR | Status: DC | PRN

## 2024-08-23 MED ORDER — BUPIVACAINE-EPINEPHRINE (PF) 0.25% -1:200000 IJ SOLN
INTRAMUSCULAR | Status: AC
  Filled 2024-08-23: qty 30

## 2024-08-23 MED ORDER — ACETAMINOPHEN 10 MG/ML IV SOLN: 1000.0000 mg | Freq: Once | INTRAVENOUS | Status: DC | PRN

## 2024-08-23 MED ORDER — OXYCODONE HCL 5 MG PO TABS
5.0000 mg | ORAL_TABLET | ORAL | 0 refills | Status: DC | PRN
  Filled 2024-08-23: qty 30, 5d supply, fill #0

## 2024-08-23 MED ORDER — SUCCINYLCHOLINE CHLORIDE 200 MG/10ML IV SOSY
PREFILLED_SYRINGE | INTRAVENOUS | Status: DC | PRN
  Administered 2024-08-23: 100 mg via INTRAVENOUS

## 2024-08-23 MED ORDER — DROPERIDOL 2.5 MG/ML IJ SOLN: 0.6250 mg | Freq: Once | INTRAMUSCULAR | Status: DC | PRN

## 2024-08-23 MED ORDER — MAGTRACE LYMPHATIC TRACER
INTRAMUSCULAR | Status: DC | PRN
  Administered 2024-08-23: 2 mL via INTRAMUSCULAR

## 2024-08-23 MED ORDER — FENTANYL CITRATE (PF) 100 MCG/2ML IJ SOLN
25.0000 ug | INTRAMUSCULAR | Status: DC | PRN
  Administered 2024-08-23 (×2): 50 ug via INTRAVENOUS

## 2024-08-23 MED ORDER — ROCURONIUM BROMIDE 10 MG/ML (PF) SYRINGE
PREFILLED_SYRINGE | INTRAVENOUS | Status: DC | PRN
  Administered 2024-08-23: 10 mg via INTRAVENOUS
  Administered 2024-08-23: 20 mg via INTRAVENOUS
  Administered 2024-08-23: 50 mg via INTRAVENOUS
  Administered 2024-08-23: 20 mg via INTRAVENOUS

## 2024-08-23 MED ORDER — FENTANYL CITRATE (PF) 250 MCG/5ML IJ SOLN
INTRAMUSCULAR | Status: DC | PRN
  Administered 2024-08-23: 50 ug via INTRAVENOUS
  Administered 2024-08-23: 100 ug via INTRAVENOUS
  Administered 2024-08-23 (×2): 50 ug via INTRAVENOUS

## 2024-08-23 MED ORDER — OXYCODONE HCL 5 MG PO TABS
5.0000 mg | ORAL_TABLET | Freq: Once | ORAL | Status: AC | PRN
  Administered 2024-08-23: 5 mg via ORAL

## 2024-08-23 MED ORDER — PHENYLEPHRINE 80 MCG/ML (10ML) SYRINGE FOR IV PUSH (FOR BLOOD PRESSURE SUPPORT)
PREFILLED_SYRINGE | INTRAVENOUS | Status: DC | PRN
  Administered 2024-08-23: 80 ug via INTRAVENOUS

## 2024-08-23 MED ORDER — SUGAMMADEX SODIUM 200 MG/2ML IV SOLN
INTRAVENOUS | Status: DC | PRN
  Administered 2024-08-23: 151.2 mg via INTRAVENOUS

## 2024-08-23 MED ORDER — BUPIVACAINE-EPINEPHRINE (PF) 0.25% -1:200000 IJ SOLN
INTRAMUSCULAR | Status: DC | PRN
  Administered 2024-08-23: 30 mL

## 2024-08-23 MED ORDER — ACETAMINOPHEN 500 MG PO TABS
1000.0000 mg | ORAL_TABLET | ORAL | Status: AC
Start: 2024-08-23 — End: 2024-08-23
  Administered 2024-08-23: 1000 mg via ORAL
  Filled 2024-08-23: qty 2

## 2024-08-23 MED ORDER — PROPOFOL 10 MG/ML IV BOLUS
INTRAVENOUS | Status: DC | PRN
  Administered 2024-08-23: 130 mg via INTRAVENOUS

## 2024-08-23 MED ORDER — FENTANYL CITRATE (PF) 250 MCG/5ML IJ SOLN
INTRAMUSCULAR | Status: AC
  Filled 2024-08-23: qty 5

## 2024-08-23 MED ORDER — 0.9 % SODIUM CHLORIDE (POUR BTL) OPTIME
TOPICAL | Status: DC | PRN
  Administered 2024-08-23: 1000 mL

## 2024-08-23 SURGICAL SUPPLY — 51 items
APPLICATOR CHLORAPREP 10.5 ORG (MISCELLANEOUS) ×3 IMPLANT
BINDER BREAST LRG (GAUZE/BANDAGES/DRESSINGS) IMPLANT
BINDER BREAST XLRG (GAUZE/BANDAGES/DRESSINGS) IMPLANT
BNDG COHESIVE 6X5 TAN ST LF (GAUZE/BANDAGES/DRESSINGS) ×3 IMPLANT
CANISTER SUCTION 3000ML PPV (SUCTIONS) ×3 IMPLANT
CHLORAPREP W/TINT 26 (MISCELLANEOUS) ×3 IMPLANT
CLIP TI LARGE 6 (CLIP) ×3 IMPLANT
CLIP TI MEDIUM 24 (CLIP) IMPLANT
CLIP TI MEDIUM 6 (CLIP) ×3 IMPLANT
CNTNR URN SCR LID CUP LEK RST (MISCELLANEOUS) IMPLANT
COVER MAYO STAND STRL (DRAPES) ×3 IMPLANT
COVER PROBE W GEL 5X96 (DRAPES) ×3 IMPLANT
COVER SURGICAL LIGHT HANDLE (MISCELLANEOUS) ×3 IMPLANT
DERMABOND ADVANCED .7 DNX12 (GAUZE/BANDAGES/DRESSINGS) ×3 IMPLANT
DEVICE DUBIN SPECIMEN MAMMOGRA (MISCELLANEOUS) ×3 IMPLANT
DRAPE CHEST BREAST 15X10 FENES (DRAPES) ×3 IMPLANT
ELECT CAUTERY BLADE 6.4 (BLADE) ×3 IMPLANT
ELECT COATED BLADE 2.86 ST (ELECTRODE) ×3 IMPLANT
ELECTRODE REM PT RTRN 9FT ADLT (ELECTROSURGICAL) ×3 IMPLANT
GAUZE PAD ABD 8X10 STRL (GAUZE/BANDAGES/DRESSINGS) IMPLANT
GAUZE SPONGE 4X4 12PLY STRL (GAUZE/BANDAGES/DRESSINGS) ×3 IMPLANT
GAUZE SPONGE 4X4 12PLY STRL LF (GAUZE/BANDAGES/DRESSINGS) IMPLANT
GLOVE BIO SURGEON STRL SZ 6 (GLOVE) ×6 IMPLANT
GLOVE INDICATOR 6.5 STRL GRN (GLOVE) ×3 IMPLANT
GLOVE SS BIOGEL STRL SZ 6.5 (GLOVE) ×3 IMPLANT
GOWN STRL REUS W/ TWL LRG LVL3 (GOWN DISPOSABLE) ×3 IMPLANT
GOWN STRL REUS W/ TWL XL LVL3 (GOWN DISPOSABLE) ×6 IMPLANT
KIT BASIN OR (CUSTOM PROCEDURE TRAY) ×3 IMPLANT
KIT MARKER MARGIN INK (KITS) ×3 IMPLANT
KIT TURNOVER KIT B (KITS) ×3 IMPLANT
LIGHT WAVEGUIDE WIDE FLAT (MISCELLANEOUS) IMPLANT
NDL FILTER BLUNT 18X1 1/2 (NEEDLE) IMPLANT
NDL HYPO 25GX1X1/2 BEV (NEEDLE) ×3 IMPLANT
NEEDLE FILTER BLUNT 18X1 1/2 (NEEDLE) ×3 IMPLANT
NEEDLE HYPO 25GX1X1/2 BEV (NEEDLE) ×3 IMPLANT
PACK GENERAL/GYN (CUSTOM PROCEDURE TRAY) ×3 IMPLANT
PACK UNIVERSAL I (CUSTOM PROCEDURE TRAY) ×3 IMPLANT
PAD ARMBOARD POSITIONER FOAM (MISCELLANEOUS) ×6 IMPLANT
SOLN 0.9% NACL 1000 ML (IV SOLUTION) ×3 IMPLANT
SOLN 0.9% NACL POUR BTL 1000ML (IV SOLUTION) ×3 IMPLANT
SPIKE FLUID TRANSFER (MISCELLANEOUS) ×6 IMPLANT
STRIP CLOSURE SKIN 1/2X4 (GAUZE/BANDAGES/DRESSINGS) ×3 IMPLANT
SUT MNCRL AB 4-0 PS2 18 (SUTURE) ×3 IMPLANT
SUT MON AB 4-0 PC3 18 (SUTURE) ×3 IMPLANT
SUT SILK 2 0 PERMA HAND 18 BK (SUTURE) ×3 IMPLANT
SUT VIC AB 2-0 SH 27XBRD (SUTURE) ×3 IMPLANT
SUT VIC AB 3-0 SH 27X BRD (SUTURE) ×3 IMPLANT
SUT VIC AB 3-0 SH 8-18 (SUTURE) IMPLANT
SYR CONTROL 10ML LL (SYRINGE) ×3 IMPLANT
TOWEL GREEN STERILE (TOWEL DISPOSABLE) ×3 IMPLANT
TOWEL GREEN STERILE FF (TOWEL DISPOSABLE) ×3 IMPLANT

## 2024-08-23 NOTE — Discharge Instructions (Addendum)
 [facility] Office Phone Number [phone]  BREAST BIOPSY/ PARTIAL MASTECTOMY: POST OP INSTRUCTIONS  Always review your discharge instruction sheet given to you by the facility where your surgery was performed.  IF YOU HAVE DISABILITY OR FAMILY LEAVE FORMS, YOU MUST BRING THEM TO THE OFFICE FOR PROCESSING.  DO NOT GIVE THEM TO YOUR DOCTOR.  Take 2 tylenol  (acetominophen) three times a day for 3 days.  If you still have pain, add ibuprofen with food in between if able to take this (if you have kidney issues or stomach issues, do not take ibuprofen).  If both of those are not enough, add the narcotic pain pill.  If you find you are needing a lot of this overnight after surgery, call the next morning for a refill.    Prescriptions will not be filled after 5pm or on week-ends. Take your usually prescribed medications unless otherwise directed You should eat very light the first 24 hours after surgery, such as soup, crackers, pudding, etc.  Resume your normal diet the day after surgery. Most patients will experience some swelling and bruising in the breast.  Ice packs and a good support bra will help.  Swelling and bruising can take several days to resolve.  It is common to experience some constipation if taking pain medication after surgery.  Increasing fluid intake and taking a stool softener will usually help or prevent this problem from occurring.  A mild laxative (Milk of Magnesia or Miralax ) should be taken according to package directions if there are no bowel movements after 48 hours. Unless discharge instructions indicate otherwise, you may remove your bandages 48 hours after surgery, and you may shower at that time.  You may have steri-strips (small skin tapes) in place directly over the incision.  These strips should be left on the skin at least for for 7-10 days.    ACTIVITIES:  You may resume regular daily activities (gradually increasing) beginning the next day.  Wearing a  good support bra or sports bra (or the breast binder) minimizes pain and swelling.  You may have sexual intercourse when it is comfortable. No heavy lifting for 1-2 weeks (not over around 10 pounds).  You may drive when you no longer are taking prescription pain medication, you can comfortably wear a seatbelt, and you can safely maneuver your car and apply brakes. RETURN TO WORK:  __________3-14 days depending on job. _______________ Wanda Garcia should see your doctor in the office for a follow-up appointment approximately two weeks after your surgery.  Your doctor's nurse will typically make your follow-up appointment when she calls you with your pathology report.  Expect your pathology report 3-4 business days after your surgery.  You may call to check if you do not hear from us  after three days.   WHEN TO CALL YOUR DOCTOR: Fever over 101.0 Nausea and/or vomiting. Extreme swelling or bruising. Continued bleeding from incision. Increased pain, redness, or drainage from the incision.  The clinic staff is available to answer your questions during regular business hours.  Please don't hesitate to call and ask to speak to one of the nurses for clinical concerns.  If you have a medical emergency, go to the nearest emergency room or call 911.  A surgeon from [facility] is always on call at the hospital.  For further questions, please visit [email or web address]

## 2024-08-23 NOTE — Op Note (Signed)
 Left Breast Magseed Bracketed lumpectomy, magseed targeted deep left axillary lymph node biopsy, sentinel lymph node biopsy, removal of left subclavian port   Indications: This patient presents with history of left breast cancer, cT2N1 grade 1 invasive ductal carcinoma, upper outer quadrant, ER+/PR+/Her2-, s/p neoadjuvant chemotherapy  Pre-operative Diagnosis: left breast cancer  Post-operative Diagnosis: Same  Surgeon: ARON SHOULDERS   Assistant: Waddell Collier, RNFA  Anesthesia: General endotracheal anesthesia  ASA Class: 3  Procedure Details  The patient was seen in the Holding Room. The risks, benefits, complications, treatment options, and expected outcomes were discussed with the patient. The possibilities of bleeding, infection, the need for additional procedures, failure to diagnose a condition, and creating a complication requiring transfusion or operation were discussed with the patient. The patient concurred with the proposed plan, giving informed consent.  The site of surgery properly noted/marked. The patient was taken to Operating Room # 10, identified, and the procedure verified as left Breast Seed bracketed Lumpectomy, seed targeted lymph node biopsy, sentinel lymph node biopsy, port removal. The left arm, breast, and chest were prepped and draped in standard fashion. A Time Out was held and the above information confirmed.   The lumpectomy was performed by creating a transverse laterally oriented incision near the previously placed radioactive seeds.  Dissection was carried down to around the point of maximum signal intensity. The cautery was used to perform the dissection.   The specimen was inked with the margin marker paint kit.    Specimen radiography confirmed inclusion of the mammographic lesion, the clips, and one of the seeds.  Additional margins were taken at the lateral margin confirming the second seed, and the inferior margin based on the specimen mammogram. The  background signal in the breast was zero. Hemostasis was achieved with cautery. The edges of the cavity were marked with large clips. The magtrace was injected into the breast tissue and massaged.     The wound was irrigated and reinspected for hemostasis.   The breast tissue was rearranged and mastopexy sutures were placed to minimize the defect.  The skin was then closed with 3-0 vicryl in layers and 4-0 monocryl subcuticular suture.    Using a Neoprobe probe, left axillary sentinel nodes were identified transcutaneously.  An oblique incision was created below the axillary hairline.  Dissection was carried through the clavipectoral fascia. The magseed containing node was identified first.  This was confirmed with specimen mammogram.  Three additional deep level two axillary sentinel nodes were removed.  Lymphovascular channels were clipped with hemoclips.  Counts per second were 950, 500, and 320.    The background count was 45 cps.  The wound was irrigated.  Hemostasis was achieved with cautery.  The axillary incision was closed with a 3-0 vicryl deep dermal interrupted sutures and a 4-0 monocryl subcuticular closure.    Sterile dressings were applied. At the end of the operation, all sponge, instrument, and needle counts were correct.  Findings: grossly clear surgical margins and no adenopathy, lateral and anterior margins are skin. Posterior margin is pectoralis.   Estimated Blood Loss:  min         Specimens: left breast tissue with seed, additional inferior margin, additional lateral margin, left axillary sentinel node with seed, left axillary sentinel node #2, left axillary sentinel node #3, left axillary sentinel node #4         Complications:  None; patient tolerated the procedure well.         Disposition: PACU - hemodynamically stable.  Condition: stable

## 2024-08-23 NOTE — Anesthesia Postprocedure Evaluation (Signed)
 Anesthesia Post Note  Patient: Wanda Garcia  Procedure(s) Performed: LEFT MAG SEED BRACKETED LUMPECTOMY (Left: Breast) MAGNETIC MARKER SEED TARGETED AXILLARY LYMPH NODE BIOPSY (Left: Axilla) LEFT SENTINEL NODE BIOPSY AXILLARY (Left: Axilla) REMOVAL PORT-A-CATH (Chest)     Patient location during evaluation: PACU Anesthesia Type: Regional Level of consciousness: awake Pain management: pain level controlled Vital Signs Assessment: post-procedure vital signs reviewed and stable Respiratory status: spontaneous breathing Cardiovascular status: blood pressure returned to baseline Postop Assessment: no apparent nausea or vomiting Anesthetic complications: no   No notable events documented.  Last Vitals:  Vitals:   08/23/24 1700 08/23/24 1715  BP: 122/76 122/82  Pulse: 91 92  Resp: (!) 25 (!) 22  Temp:  36.6 C  SpO2: 97% 95%    Last Pain:  Vitals:   08/23/24 1715  TempSrc:   PainSc: 4                  Lauraine KATHEE [place]

## 2024-08-23 NOTE — Transfer of Care (Signed)
 Immediate Anesthesia Transfer of Care Note  Patient: Wanda Garcia  Procedure(s) Performed: LEFT MAG SEED BRACKETED LUMPECTOMY (Left: Breast) MAGNETIC MARKER SEED TARGETED AXILLARY LYMPH NODE BIOPSY (Left: Axilla) LEFT SENTINEL NODE BIOPSY AXILLARY (Left: Axilla) REMOVAL PORT-A-CATH (Chest)  Patient Location: PACU  Anesthesia Type:General  Level of Consciousness: drowsy  Airway & Oxygen Therapy: Patient Spontanous Breathing and Patient connected to face mask oxygen  Post-op Assessment: Report given to RN and Post -op Vital signs reviewed and stable  Post vital signs: Reviewed and stable  Last Vitals:  Vitals Value Taken Time  BP 135/94 08/23/24 16:15  Temp 98   Pulse 97 08/23/24 16:20  Resp 20 08/23/24 16:20  SpO2 100 % 08/23/24 16:20  Vitals shown include unfiled device data.  Last Pain:  Vitals:   08/23/24 1202  TempSrc:   PainSc: 0-No pain         Complications: No notable events documented.

## 2024-08-23 NOTE — Anesthesia Procedure Notes (Signed)
 Procedure Name: Intubation Date/Time: 08/23/2024 1:31 PM  Performed by: Mannie Krystal LABOR, CRNAPre-anesthesia Checklist: Patient identified, Emergency Drugs available, Suction available and Patient being monitored Patient Re-evaluated:Patient Re-evaluated prior to induction Oxygen Delivery Method: Circle system utilized Preoxygenation: Pre-oxygenation with 100% oxygen Induction Type: IV induction Ventilation: Mask ventilation without difficulty Laryngoscope Size: Mac and 3 Grade View: Grade I Tube type: Oral Tube size: 7.0 mm Number of attempts: 1 Airway Equipment and Method: Stylet and Oral airway Placement Confirmation: ETT inserted through vocal cords under direct vision, positive ETCO2 and breath sounds checked- equal and bilateral Secured at: 22 cm Tube secured with: Tape Dental Injury: Teeth and Oropharynx as per pre-operative assessment

## 2024-08-23 NOTE — Anesthesia Preprocedure Evaluation (Addendum)
 Anesthesia Evaluation  Patient identified by MRN, date of birth, ID band Patient awake    Reviewed: Allergy & Precautions, NPO status , Patient's Chart, lab work & pertinent test results  History of Anesthesia Complications Negative for: history of anesthetic complications  Airway Mallampati: I       Dental  (+) Teeth Intact, Dental Advisory Given   Pulmonary Current Smoker and Patient abstained from smoking.   breath sounds clear to auscultation       Cardiovascular  Rhythm:Regular Rate:Normal  TTE (2025):  1. Left ventricular ejection fraction, by estimation, is 60 to 65%. The  left ventricle has normal function. The left ventricle has no regional  wall motion abnormalities. Left ventricular diastolic parameters were  normal. The average left ventricular  global longitudinal strain is -18.3 %. The global longitudinal strain is  normal.   2. Right ventricular systolic function is normal. The right ventricular  size is normal.   3. The mitral valve is normal in structure. Trivial mitral valve  regurgitation. No evidence of mitral stenosis.   4. The aortic valve is tricuspid. Aortic valve regurgitation is not  visualized. No aortic stenosis is present.   5. The inferior vena cava is normal in size with greater than 50%  respiratory variability, suggesting right atrial pressure of 3 mmHg.     Neuro/Psych  Headaches   Depression       GI/Hepatic ,neg GERD  ,,  Endo/Other    Renal/GU      Musculoskeletal  (+) Arthritis ,    Abdominal   Peds  Hematology   Anesthesia Other Findings   Reproductive/Obstetrics L Breast Cancer                              Anesthesia Physical Anesthesia Plan  ASA: 3  Anesthesia Plan: General and Regional   Post-op Pain Management:    Induction: Intravenous  PONV Risk Score and Plan: 1 and Propofol  infusion, Ondansetron  and Scopolamine  patch -  Pre-op  Airway Management Planned: Oral ETT  Additional Equipment:   Intra-op Plan:   Post-operative Plan: Extubation in OR  Informed Consent:      Dental advisory given and Interpreter used for interview  Plan Discussed with: CRNA and Surgeon  Anesthesia Plan Comments:          Anesthesia Quick Evaluation

## 2024-08-23 NOTE — Progress Notes (Signed)
 Time out performed with nuclear medicine prior to left breast injections.

## 2024-08-23 NOTE — Interval H&P Note (Signed)
 History and Physical Interval Note:  08/23/2024 12:48 PM  Wanda Garcia  has presented today for surgery, with the diagnosis of LEFT BREAST CANCER.  The various methods of treatment have been discussed with the patient and family. After consideration of risks, benefits and other options for treatment, the patient has consented to  Procedure(s) with comments: LUMPECTOMY WITH MAGNETIC MARKER LOCALIZATION (Left) - GEN w/PEC BLOCK LEFT SEED BRACKETED LUMPECTOMY BIOPSY, SENTINEL NODE WITH MAGNETIC MARKER LOCALIZATION (Left) - SEED TARGETED AXILLARY LYMPH NODE BIOPSY BIOPSY, LYMPH NODE, SENTINEL, AXILLARY (Left) - LEFT SENTINEL NODE BIOPSY REMOVAL PORT-A-CATH (N/A) as a surgical intervention.  The patient's history has been reviewed, patient examined, no change in status, stable for surgery.  I have reviewed the patient's chart and labs.  Questions were answered to the patient's satisfaction.     Jina Nephew

## 2024-08-24 ENCOUNTER — Encounter (HOSPITAL_COMMUNITY): Payer: Self-pay | Admitting: General Surgery

## 2024-08-24 NOTE — Anesthesia Procedure Notes (Signed)
 Anesthesia Regional Block: Pectoralis block   Pre-Anesthetic Checklist: , timeout performed,  Correct Patient, Correct Site, Correct Laterality,  Correct Procedure, Correct Position, site marked,  Risks and benefits discussed,  Surgical consent,  Pre-op evaluation,  At surgeon's request and post-op pain management  Laterality: Upper  Prep: Maximum Sterile Barrier Precautions used, chloraprep       Needles:  Injection technique: Single-shot  Needle Type: Echogenic Stimulator Needle     Needle Length: 10cm  Needle Gauge: 20     Additional Needles:   Procedures:,,,, ultrasound used (permanent image in chart),, #20gu IV placed    Narrative:  Start time: 08/23/2024 1:47 PM End time: 08/23/2024 1:47 PM  Performed by: Personally  Anesthesiologist: Waddell Lauraine NOVAK, MD

## 2024-08-24 NOTE — Addendum Note (Signed)
 Addendum  created 08/24/24 1756 by Waddell Lauraine NOVAK, MD   Child order released for a procedure order, Clinical Note Signed, Intraprocedure Blocks edited, SmartForm saved

## 2024-08-25 LAB — SURGICAL PATHOLOGY

## 2024-08-26 ENCOUNTER — Encounter: Payer: Self-pay | Admitting: *Deleted

## 2024-08-26 ENCOUNTER — Ambulatory Visit: Payer: Self-pay | Admitting: General Surgery

## 2024-08-29 ENCOUNTER — Other Ambulatory Visit: Payer: Self-pay | Admitting: General Surgery

## 2024-08-31 ENCOUNTER — Encounter: Payer: Self-pay | Admitting: *Deleted

## 2024-09-05 ENCOUNTER — Inpatient Hospital Stay: Admitting: Hematology and Oncology

## 2024-09-12 ENCOUNTER — Encounter (HOSPITAL_COMMUNITY): Payer: Self-pay | Admitting: General Surgery

## 2024-09-12 NOTE — Progress Notes (Signed)
 PCP - [facility]  Cardiologist - none Oncology - Dr Mackey Chad  Chest x-ray - 04/08/24 EKG - n/a Stress Test - n/a ECHO - 04/05/24 Cardiac Cath - n/a  ICD Pacemaker/Loop - n/a  Sleep Study -  n/a  Diabetes - n/a  Aspirin & Blood Thinner Instructions:  n/a  ERAS - clear liquids til 0830 DOS.  Anesthesia review: no  STOP now taking any Aspirin (unless otherwise instructed by your surgeon), Aleve, Naproxen, Ibuprofen , Motrin , Advil , Goody's, BC's, all herbal medications, fish oil, and all vitamins.   Coronavirus Screening Do you have any of the following symptoms:  Cough yes/no: No Fever (>100.23F)  yes/no: No Runny nose yes/no: No Sore throat yes/no: No Difficulty breathing/shortness of breath  yes/no: No  Have you traveled in the last 14 days and where? yes/no: No  Patient's husband Cynthia Elbanna verbalized understanding of instructions that were given via phone. [employer]preter (Arabic) ID# [phone] was used but husband speaks English.

## 2024-09-12 NOTE — H&P (Signed)
 PROVIDER:  JINA CLAIR NEPHEW, MD Patient Care Team: Theotis Ohm, NP as PCP - General (Family Medicine) Nephew Jina Clair, MD as Consulting Provider (Surgical Oncology) Izell Lauraine Norris, MD (Radiation Oncology) Gudena, Vinay K, MD (Hematology and Oncology) Jama Dorothe RAMAN, MD (Radiology)   MRN: [id number] DOB: 05/04/1976 DATE OF ENCOUNTER: 08/29/2024 Initial History:    Patient presented with a new diagnosis of left breast cancer in May 2025.  Patient had a screening detected mass.  She underwent baseline screening mammogram and had focal asymmetry on the left.  Diagnostic imaging was performed.  This was at [facility].  She had a 1.1 cm irregular mass in the upper outer breast at 2:00 6 cm from the nipple.  There was also a 1.3 cm irregular mass with obscured margins and architectural distortions of calcifications at 3:00 10 cm from the nipple.  Contrast-enhanced mammogram and ultrasound were performed.  Ultrasound confirmed these masses and demonstrated that the 3:00 mass was larger at 2.2 cm.  It also showed an abnormal lymph node.  The contrast-enhanced mammogram showed multiple additional 2 to 5 mm enhancing masses in a segmental distribution in between and anterior to the 2 masses that measured 7.5 cm in greatest dimension but was 3.3 cm from craniocaudal and 3 cm transversely.  7.5 cm was in the anterior to posterior dimension.  The 2 lymph nodes demonstrated enhancement.  One of the lymph nodes was biopsied and the 2 larger masses were biopsied.  The showed grade 1 invasive ductal carcinoma.  The lymph node also was positive for carcinoma with no residual lymphoid tissue.  Days was ER and PR strongly positive HER2 was negative and Ki-67 was 15%.     She underwent chemotherapy and completed the [facility] portion.  She had to abandon the remaining taxol  due to toxicities.  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    MRI results showed that  the breast tumors decreased in size with the enhancement in between going from around 7.5 cm to around 5.5 cm, and the lymph nodes in the armpit are now described as more normal in size.     Interval History:    Patient underwent left seed bracketed lumpectomy, left seed targeted lymph biopsy, left SLN bx, and port removal 08/23/24.  Unfortunately, she had 5 positive nodes.  She had negative margins on the lumpectomy and did have some chemo response.  There was an issue initially in that she had cream in her coffee and had to have an RSI.  Seh wasn't able to get sedation with Tc99 injection.     History of Present Illness Wanda Garcia is a 48 year old female with breast cancer who presents for discussion of pathology results and further surgical management. She is accompanied by her husband.   Arabic interpreter by phone utilized.   She recently underwent surgery to excise cancerous tissue from her breast. The pathology report revealed two areas of cancer in the breast with negative margins.   In the axillary region, five lymph nodes were excised, all of which tested positive for cancer. This finding raises the concern for additional cancerous lymph nodes, necessitating further surgical intervention to remove more lymph nodes from the axilla.   She previously received chemotherapy, which resulted in neuropathy, specifically numbness in her fingertips. Due to this side effect, further chemotherapy is not preferred at this time.   She is concerned about the potential spread of cancer within the next  two weeks before the additional surgery can be scheduled. She inquires about the possibility of taking medication to prevent cancer spread during this period, but no such medication is currently planned.   Post-surgery, she is managing pain with medication and is advised to wear a tight binder or sports bra for comfort.     Pathology 08/23/24 A. BREAST, LEFT, LUMPECTOMY: - Two separate primary  tumors:    i) Invasive ductal carcinoma, 1.1 cm, grade 1 (ribbon clip), see comment   ii) Invasive ductal carcinoma, 1.9 cm, grade 1 (coil clip) - Ductal carcinoma in situ, intermediate grade - Resection margins are negative for carcinoma - Negative for lymphovascular or perineural invasion - Biopsy site changes - See oncology table  B. BREAST, LEFT ADDITIONAL INFERIOR MARGIN, EXCISION: - Small microscopic foci of invasive ductal carcinoma, grade 1 - New inked inferior margin is 0.8 cm from carcinoma  C. BREAST, LEFT ADDITIONAL LATERAL MARGIN, EXCISION: - Benign fibroadipose tissue and breast parenchyma - Negative for in situ or invasive carcinoma  D. LYMPH NODE, LEFT AXILLARY, SENTINEL, EXCISION: - Metastatic carcinoma involving a lymph node (1/1) - Metastatic focus measures 9 mm in greatest dimension and shows focal extranodal extension  E. LYMPH NODE, LEFT AXILLARY #2, SENTINEL, EXCISION: - Metastatic carcinoma involving two lymph nodes (2/2), measuring 2 and 4 mm in greatest dimension yeah - Focally suspicious for extranodal extension  F. LYMPH NODE, LEFT AXILLARY #3, SENTINEL, EXCISION: - Metastatic carcinoma involving a lymph node (1/1) - Metastatic focus measures 5 mm in greatest dimension - focally suspicious for extranodal extension  G. LYMPH NODE, LEFT AXILLARY #4, SENTINEL, EXCISION: - Metastatic carcinoma involving a lymph node (1/1) - Metastatic focus measures 4 mm in greatest dimension and shows focal extranodal extension     Physical Examination:    Left breast: No erythema, no appreciable hematoma or seroma Left axilla: No appreciable seroma or erythema Left port site without complication   Assessment and Plan:    Assessment Diagnoses and all orders for this visit:   Malignant neoplasm of upper-outer quadrant of left breast in female, estrogen receptor positive (CMS/HHS-HCC)   Breast cancer metastasized to axillary lymph node, left (CMS/HHS-HCC)        Discussed path with oncology ALND recommended. This is presented to patient and husband.  Orders written.       Assessment & Plan Left breast cancer with axillary lymph node metastasis Pathology confirmed two cancerous areas in the left breast with negative margins. All five axillary lymph nodes positive for metastasis. 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 Radiation and antihormone therapy planned post-surgery. - Proceed with completion left axillary lymph node dissection. - Schedule surgery within two weeks as urgent. - Post-surgery, initiate radiation therapy and antihormone treatment. - Continue current pain management regimen.   Planned completion left axillary lymph node dissection Additional surgery required due to cancer in all five initially removed lymph nodes. Risks include numbness and weakness in the arm due to nerve involvement. Surgery duration is 1.5 to 2 hours with a post-operative drain for about a week. - Perform left axillary lymph node dissection, enlarging the current incision. - Place a drain post-surgery for approximately one week. - Schedule physical therapy a few weeks post-surgery to regain range of motion.

## 2024-09-13 ENCOUNTER — Ambulatory Visit

## 2024-09-13 ENCOUNTER — Encounter (HOSPITAL_COMMUNITY): Payer: Self-pay | Admitting: General Surgery

## 2024-09-13 ENCOUNTER — Other Ambulatory Visit: Payer: Self-pay

## 2024-09-13 ENCOUNTER — Ambulatory Visit: Admitting: Radiation Oncology

## 2024-09-14 ENCOUNTER — Encounter (HOSPITAL_COMMUNITY): Payer: Self-pay | Admitting: General Surgery

## 2024-09-14 ENCOUNTER — Ambulatory Visit (HOSPITAL_COMMUNITY): Admitting: Registered Nurse

## 2024-09-14 ENCOUNTER — Ambulatory Visit (HOSPITAL_COMMUNITY)
Admission: RE | Admit: 2024-09-14 | Discharge: 2024-09-14 | Disposition: A | Discharge status: Home / Self Care | Attending: General Surgery | Admitting: General Surgery

## 2024-09-14 ENCOUNTER — Ambulatory Visit (HOSPITAL_BASED_OUTPATIENT_CLINIC_OR_DEPARTMENT_OTHER): Admitting: Registered Nurse

## 2024-09-14 ENCOUNTER — Encounter (HOSPITAL_COMMUNITY): Admission: RE | Disposition: A | Payer: Self-pay | Source: Home / Self Care | Attending: General Surgery

## 2024-09-14 DIAGNOSIS — Z9221 Personal history of antineoplastic chemotherapy: Secondary | ICD-10-CM | POA: Diagnosis not present

## 2024-09-14 DIAGNOSIS — Z1721 Progesterone receptor positive status: Secondary | ICD-10-CM | POA: Insufficient documentation

## 2024-09-14 DIAGNOSIS — Z1732 Human epidermal growth factor receptor 2 negative status: Secondary | ICD-10-CM | POA: Diagnosis not present

## 2024-09-14 DIAGNOSIS — C50912 Malignant neoplasm of unspecified site of left female breast: Secondary | ICD-10-CM

## 2024-09-14 DIAGNOSIS — C50412 Malignant neoplasm of upper-outer quadrant of left female breast: Secondary | ICD-10-CM | POA: Insufficient documentation

## 2024-09-14 DIAGNOSIS — G620 Drug-induced polyneuropathy: Secondary | ICD-10-CM | POA: Diagnosis not present

## 2024-09-14 DIAGNOSIS — Z170 Estrogen receptor positive status [ER+]: Secondary | ICD-10-CM | POA: Diagnosis not present

## 2024-09-14 DIAGNOSIS — T451X5A Adverse effect of antineoplastic and immunosuppressive drugs, initial encounter: Secondary | ICD-10-CM | POA: Insufficient documentation

## 2024-09-14 DIAGNOSIS — C773 Secondary and unspecified malignant neoplasm of axilla and upper limb lymph nodes: Secondary | ICD-10-CM | POA: Insufficient documentation

## 2024-09-14 HISTORY — PX: AXILLARY LYMPH NODE DISSECTION: SHX5229

## 2024-09-14 LAB — POCT PREGNANCY, URINE: Preg Test, Ur: NEGATIVE

## 2024-09-14 SURGERY — LYMPHADENECTOMY, AXILLARY
Anesthesia: Regional | Site: Axilla | Laterality: Left

## 2024-09-14 MED ORDER — OXYCODONE HCL 5 MG/5ML PO SOLN: 5.0000 mg | Freq: Once | ORAL | Status: AC | PRN

## 2024-09-14 MED ORDER — CHLORHEXIDINE GLUCONATE CLOTH 2 % EX PADS: 6.0000 | MEDICATED_PAD | Freq: Once | CUTANEOUS | Status: DC

## 2024-09-14 MED ORDER — CEFAZOLIN SODIUM-DEXTROSE 2-4 GM/100ML-% IV SOLN
INTRAVENOUS | Status: AC
  Filled 2024-09-14: qty 100

## 2024-09-14 MED ORDER — FENTANYL CITRATE (PF) 100 MCG/2ML IJ SOLN
INTRAMUSCULAR | Status: AC
  Filled 2024-09-14: qty 2

## 2024-09-14 MED ORDER — MIDAZOLAM HCL 2 MG/2ML IJ SOLN
INTRAMUSCULAR | Status: AC
Start: 2024-09-14 — End: 2024-09-14
  Filled 2024-09-14: qty 2

## 2024-09-14 MED ORDER — LACTATED RINGERS IV SOLN: INTRAVENOUS | Status: DC

## 2024-09-14 MED ORDER — ACETAMINOPHEN 500 MG PO TABS
ORAL_TABLET | ORAL | Status: AC
Start: 2024-09-14 — End: 2024-09-14
  Administered 2024-09-14: 1000 mg via ORAL
  Filled 2024-09-14: qty 2

## 2024-09-14 MED ORDER — LIDOCAINE 2% (20 MG/ML) 5 ML SYRINGE
INTRAMUSCULAR | Status: DC | PRN
  Administered 2024-09-14: 80 mg via INTRAVENOUS

## 2024-09-14 MED ORDER — PROPOFOL 10 MG/ML IV BOLUS
INTRAVENOUS | Status: DC | PRN
Start: 2024-09-14 — End: 2024-09-14
  Administered 2024-09-14: 170 mg via INTRAVENOUS
  Administered 2024-09-14: 30 mg via INTRAVENOUS

## 2024-09-14 MED ORDER — FENTANYL CITRATE (PF) 100 MCG/2ML IJ SOLN
INTRAMUSCULAR | Status: DC | PRN
  Administered 2024-09-14 (×2): 50 ug via INTRAVENOUS
  Administered 2024-09-14: 25 ug via INTRAVENOUS
  Administered 2024-09-14: 50 ug via INTRAVENOUS
  Administered 2024-09-14 (×3): 25 ug via INTRAVENOUS

## 2024-09-14 MED ORDER — PROPOFOL 10 MG/ML IV BOLUS
INTRAVENOUS | Status: AC
  Filled 2024-09-14: qty 20

## 2024-09-14 MED ORDER — ONDANSETRON HCL 4 MG/2ML IJ SOLN
INTRAMUSCULAR | Status: DC | PRN
  Administered 2024-09-14: 4 mg via INTRAVENOUS

## 2024-09-14 MED ORDER — 0.9 % SODIUM CHLORIDE (POUR BTL) OPTIME
TOPICAL | Status: DC | PRN
  Administered 2024-09-14: 1000 mL

## 2024-09-14 MED ORDER — CEFAZOLIN SODIUM-DEXTROSE 2-4 GM/100ML-% IV SOLN
2.0000 g | INTRAVENOUS | Status: AC
  Administered 2024-09-14: 2 g via INTRAVENOUS

## 2024-09-14 MED ORDER — MIDAZOLAM HCL 2 MG/2ML IJ SOLN
INTRAMUSCULAR | Status: AC
  Filled 2024-09-14: qty 2

## 2024-09-14 MED ORDER — PROPOFOL 10 MG/ML IV BOLUS
INTRAVENOUS | Status: AC
Start: 2024-09-14 — End: 2024-09-14
  Filled 2024-09-14: qty 20

## 2024-09-14 MED ORDER — DEXAMETHASONE SOD PHOSPHATE PF 10 MG/ML IJ SOLN
INTRAMUSCULAR | Status: DC | PRN
  Administered 2024-09-14: 10 mg via INTRAVENOUS

## 2024-09-14 MED ORDER — ACETAMINOPHEN 10 MG/ML IV SOLN
INTRAVENOUS | Status: AC
Start: 2024-09-14 — End: 2024-09-14
  Filled 2024-09-14: qty 100

## 2024-09-14 MED ORDER — ACETAMINOPHEN 500 MG PO TABS
1000.0000 mg | ORAL_TABLET | ORAL | Status: AC
Start: 2024-09-14 — End: 2024-09-14

## 2024-09-14 MED ORDER — CHLORHEXIDINE GLUCONATE 0.12 % MT SOLN
OROMUCOSAL | Status: AC
Start: 2024-09-14 — End: 2024-09-14
  Administered 2024-09-14: 15 mL via OROMUCOSAL
  Filled 2024-09-14: qty 15

## 2024-09-14 MED ORDER — OXYCODONE HCL 5 MG PO TABS
ORAL_TABLET | ORAL | Status: AC
  Filled 2024-09-14: qty 1

## 2024-09-14 MED ORDER — OXYCODONE HCL 5 MG PO TABS: 5.0000 mg | ORAL_TABLET | ORAL | 0 refills | Status: DC | PRN

## 2024-09-14 MED ORDER — LIDOCAINE-EPINEPHRINE 1 %-1:100000 IJ SOLN
INTRAMUSCULAR | Status: AC
Start: 2024-09-14 — End: 2024-09-14
  Filled 2024-09-14: qty 1

## 2024-09-14 MED ORDER — OXYCODONE HCL 5 MG PO TABS
5.0000 mg | ORAL_TABLET | Freq: Once | ORAL | Status: AC | PRN
  Administered 2024-09-14: 5 mg via ORAL

## 2024-09-14 MED ORDER — CHLORHEXIDINE GLUCONATE 0.12 % MT SOLN: 15.0000 mL | Freq: Once | OROMUCOSAL | Status: AC

## 2024-09-14 MED ORDER — PHENYLEPHRINE 80 MCG/ML (10ML) SYRINGE FOR IV PUSH (FOR BLOOD PRESSURE SUPPORT)
PREFILLED_SYRINGE | INTRAVENOUS | Status: DC | PRN
  Administered 2024-09-14: 160 ug via INTRAVENOUS

## 2024-09-14 MED ORDER — ORAL CARE MOUTH RINSE: 15.0000 mL | Freq: Once | OROMUCOSAL | Status: AC

## 2024-09-14 MED ORDER — LIDOCAINE-EPINEPHRINE 1 %-1:100000 IJ SOLN
INTRAMUSCULAR | Status: DC | PRN
  Administered 2024-09-14: 12 mL

## 2024-09-14 MED ORDER — MIDAZOLAM HCL (PF) 2 MG/2ML IJ SOLN
INTRAMUSCULAR | Status: DC | PRN
  Administered 2024-09-14: 1 mg via INTRAVENOUS
  Administered 2024-09-14: 2 mg via INTRAVENOUS
  Administered 2024-09-14: 1 mg via INTRAVENOUS

## 2024-09-14 MED ORDER — FENTANYL CITRATE (PF) 100 MCG/2ML IJ SOLN
25.0000 ug | INTRAMUSCULAR | Status: DC | PRN
  Administered 2024-09-14 (×4): 50 ug via INTRAVENOUS

## 2024-09-14 MED ORDER — ACETAMINOPHEN 10 MG/ML IV SOLN
1000.0000 mg | Freq: Once | INTRAVENOUS | Status: DC | PRN
  Administered 2024-09-14: 1000 mg via INTRAVENOUS

## 2024-09-14 SURGICAL SUPPLY — 35 items
BNDG COHESIVE 6X5 TAN ST LF (GAUZE/BANDAGES/DRESSINGS) ×1 IMPLANT
CANISTER SUCTION 3000ML PPV (SUCTIONS) IMPLANT
CHLORAPREP W/TINT 26 (MISCELLANEOUS) ×1 IMPLANT
CLIP TI MEDIUM 6 (CLIP) ×1 IMPLANT
CNTNR URN SCR LID CUP LEK RST (MISCELLANEOUS) IMPLANT
COVER MAYO STAND STRL (DRAPES) ×1 IMPLANT
COVER SURGICAL LIGHT HANDLE (MISCELLANEOUS) ×1 IMPLANT
DERMABOND ADVANCED .7 DNX12 (GAUZE/BANDAGES/DRESSINGS) ×1 IMPLANT
DRAIN CHANNEL 19F RND (DRAIN) IMPLANT
DRAPE SURG 17X23 STRL (DRAPES) IMPLANT
DRSG TEGADERM 4X4.75 (GAUZE/BANDAGES/DRESSINGS) IMPLANT
ELECTRODE REM PT RTRN 9FT ADLT (ELECTROSURGICAL) ×1 IMPLANT
EVACUATOR SILICONE 100CC (DRAIN) IMPLANT
GAUZE SPONGE 4X4 12PLY STRL (GAUZE/BANDAGES/DRESSINGS) ×1 IMPLANT
GLOVE BIO SURGEON STRL SZ 6 (GLOVE) ×1 IMPLANT
GLOVE INDICATOR 6.5 STRL GRN (GLOVE) ×1 IMPLANT
GOWN STRL REUS W/ TWL XL LVL3 (GOWN DISPOSABLE) ×2 IMPLANT
KIT BASIN OR (CUSTOM PROCEDURE TRAY) ×1 IMPLANT
KIT TURNOVER KIT B (KITS) ×1 IMPLANT
LIGHT WAVEGUIDE WIDE FLAT (MISCELLANEOUS) IMPLANT
NDL HYPO 25GX1X1/2 BEV (NEEDLE) IMPLANT
NEEDLE HYPO 25GX1X1/2 BEV (NEEDLE) ×1 IMPLANT
PACK GENERAL/GYN (CUSTOM PROCEDURE TRAY) ×1 IMPLANT
PACK UNIVERSAL I (CUSTOM PROCEDURE TRAY) ×1 IMPLANT
PAD ARMBOARD POSITIONER FOAM (MISCELLANEOUS) ×2 IMPLANT
SOLN 0.9% NACL POUR BTL 1000ML (IV SOLUTION) ×1 IMPLANT
STRIP CLOSURE SKIN 1/2X4 (GAUZE/BANDAGES/DRESSINGS) ×1 IMPLANT
SUT ETHILON 2 0 FS 18 (SUTURE) IMPLANT
SUT MON AB 4-0 PC3 18 (SUTURE) ×1 IMPLANT
SUT SILK 2 0 PERMA HAND 18 BK (SUTURE) ×1 IMPLANT
SUT VIC AB 2-0 SH 27XBRD (SUTURE) IMPLANT
SUT VIC AB 3-0 SH 27X BRD (SUTURE) ×1 IMPLANT
SUT VIC AB 3-0 SH 8-18 (SUTURE) IMPLANT
SYR CONTROL 10ML LL (SYRINGE) IMPLANT
TOWEL GREEN STERILE (TOWEL DISPOSABLE) ×1 IMPLANT

## 2024-09-14 NOTE — Anesthesia Preprocedure Evaluation (Signed)
 Anesthesia Evaluation  Patient identified by MRN, date of birth, ID band Patient awake    Reviewed: Allergy & Precautions, NPO status , Patient's Chart, lab work & pertinent test results  History of Anesthesia Complications Negative for: history of anesthetic complications  Airway Mallampati: III  TM Distance: >3 FB Neck ROM: Full    Dental  (+) Dental Advisory Given   Pulmonary neg shortness of breath, neg sleep apnea, neg COPD, neg recent URI, Current Smoker and Patient abstained from smoking.   breath sounds clear to auscultation       Cardiovascular negative cardio ROS  Rhythm:Regular     Neuro/Psych  Headaches, neg Seizures    GI/Hepatic negative GI ROS, Neg liver ROS,,,  Endo/Other  negative endocrine ROS    Renal/GU negative Renal ROS     Musculoskeletal  (+) Arthritis ,    Abdominal   Peds  Hematology negative hematology ROS (+)   Anesthesia Other Findings   Reproductive/Obstetrics                              Anesthesia Physical Anesthesia Plan  ASA: 2  Anesthesia Plan: General and Regional   Post-op Pain Management: Regional block* and Toradol  IV (intra-op)*   Induction: Intravenous  PONV Risk Score and Plan: 3 and Ondansetron , Dexamethasone , Propofol  infusion, TIVA and Midazolam   Airway Management Planned: LMA  Additional Equipment: None  Intra-op Plan:   Post-operative Plan: Extubation in OR  Informed Consent: I have reviewed the patients History and Physical, chart, labs and discussed the procedure including the risks, benefits and alternatives for the proposed anesthesia with the patient or authorized representative who has indicated his/her understanding and acceptance.     Dental advisory given  Plan Discussed with: CRNA  Anesthesia Plan Comments:         Anesthesia Quick Evaluation

## 2024-09-14 NOTE — Interval H&P Note (Signed)
 History and Physical Interval Note:  09/14/2024 11:03 AM  Wanda Garcia  has presented today for surgery, with the diagnosis of LEFT BREAST CANCER.  The various methods of treatment have been discussed with the patient and family. After consideration of risks, benefits and other options for treatment, the patient has consented to  Procedure(s) with comments: LYMPHADENECTOMY, AXILLARY (Left) - LEFT AXILLARY LYMPH NODE DISSECTION GEN w/PEC BLOCK as a surgical intervention.  The patient's history has been reviewed, patient examined, no change in status, stable for surgery.  I have reviewed the patient's chart and labs.  Questions were answered to the patient's satisfaction.     Jina Nephew

## 2024-09-14 NOTE — Op Note (Signed)
 PRE-OPERATIVE DIAGNOSIS: left breast cancer, [id number]  POST-OPERATIVE DIAGNOSIS:  Same  PROCEDURE:  Procedure(s): Left axillary lymph node dissection  SURGEON:  Surgeon(s): Jina Nephew, MD  ASSIST:  Resident Fairy CHRISTELLA Costain, MD- PGY6  ANESTHESIA:   regional and general  DRAINS: (19 Fr ) Blake drain(s) in the left axilla   LOCAL MEDICATIONS USED:  LIDOCAINE    SPECIMEN:  Source of Specimen:  left axillary contents  DISPOSITION OF SPECIMEN:  PATHOLOGY  COUNTS:  YES  DICTATION: .Dragon Dictation  PLAN OF CARE: home  PATIENT DISPOSITION:  PACU - hemodynamically stable.  FINDINGS:  some questionable nodes in level 2.    EBL: min  PROCEDURE:   The patient was seen in the Holding Room. The patient had questions addressed.  Previously in clinic, extensive discussion of risks and benefits occurred. The patient and spouse concurred with the proposed plan, giving informed consent.  The site of surgery properly noted/marked. The patient was taken to Operating Room # 5, identified, and the procedure verified as left axillary lymph node dissection. The left arm, breast, and chest were prepped and draped in standard fashion. A Time Out was held and the above information confirmed.    An curvalinear incision was made in the axilla. An axillary dissection was performed with removal of the associated lymph nodes and surrounding adipose tissue. This included levels I and II. This was accomplished by exposing the axillary vein anteriorly and inferiorly to the level of the pectoralis minor and laterally over the latissimus dorsi muscle. Posteriorly, the dissection continued to the subscapularis.  Small venous tributaries, lymphatics, and vessels were clipped and ligated or cauterized and divided. The subscapularis muscle was skeletonized. The long thoracic and thoracodorsal neurovascular bundles were identified and preserved.  The wound was irrigated and closed with a 3-0 Vicryl deep dermal  interrupted and a 4-0 vicryl subcuticular closure in layers.  Sterile dressings were applied. At the end of the operation, all sponge, instrument, and needle counts were correct.  Patient was allowed to emerge from anesthesia and taken to the PACU in stable condition

## 2024-09-14 NOTE — Anesthesia Procedure Notes (Addendum)
 Procedure Name: LMA Insertion Date/Time: 09/14/2024 11:59 AM  Performed by: Emmitt Millman, CRNAPre-anesthesia Checklist: Patient identified, Patient being monitored, Timeout performed, Emergency Drugs available and Suction available Patient Re-evaluated:Patient Re-evaluated prior to induction Oxygen Delivery Method: Circle system utilized Preoxygenation: Pre-oxygenation with 100% oxygen Induction Type: IV induction Ventilation: Mask ventilation without difficulty LMA: LMA inserted LMA Size: 4.0 Tube type: Oral Number of attempts: 1 Placement Confirmation: positive ETCO2 and breath sounds checked- equal and bilateral Tube secured with: Tape Dental Injury: Teeth and Oropharynx as per pre-operative assessment  Comments: Patient with sore on left side of bottom lip prior to induction. Sore bled after placement of LMA -- phenylephrine  applied with gauze and lubrication placed on lips with cessation of bleeding.

## 2024-09-14 NOTE — Discharge Instructions (Signed)
 [facility] Office Phone Number [phone]  BREAST BIOPSY/ PARTIAL MASTECTOMY: POST OP INSTRUCTIONS  Always review your discharge instruction sheet given to you by the facility where your surgery was performed.  IF YOU HAVE DISABILITY OR FAMILY LEAVE FORMS, YOU MUST BRING THEM TO THE OFFICE FOR PROCESSING.  DO NOT GIVE THEM TO YOUR DOCTOR.  Take 2 tylenol  (acetominophen) three times a day for 3 days.  If you still have pain, add ibuprofen  with food in between if able to take this (if you have kidney issues or stomach issues, do not take ibuprofen ).  If both of those are not enough, add the narcotic pain pill.  If you find you are needing a lot of this overnight after surgery, call the next morning for a refill.    Prescriptions will not be filled after 5pm or on week-ends. Take your usually prescribed medications unless otherwise directed You should eat very light the first 24 hours after surgery, such as soup, crackers, pudding, etc.  Resume your normal diet the day after surgery. Most patients will experience some swelling and bruising in the breast.  Ice packs and a good support bra will help.  Swelling and bruising can take several days to resolve.  Measure and record drain output 1-3 times per day.  Bring record to clinic.   It is common to experience some constipation if taking pain medication after surgery.  Increasing fluid intake and taking a stool softener will usually help or prevent this problem from occurring.  A mild laxative (Milk of Magnesia or Miralax) should be taken according to package directions if there are no bowel movements after 48 hours. Unless discharge instructions indicate otherwise, you may remove your bandages 48 hours after surgery, and you may shower at that time.  You may have steri-strips (small skin tapes) in place directly over the incision.  These strips should be left on the skin at least for for 7-10 days.    ACTIVITIES:  You may resume  regular daily activities (gradually increasing) beginning the next day.  Wearing a good support bra or sports bra (or the breast binder) minimizes pain and swelling.  You may have sexual intercourse when it is comfortable. No heavy lifting for 1-2 weeks (not over around 10 pounds).  You may drive when you no longer are taking prescription pain medication, you can comfortably wear a seatbelt, and you can safely maneuver your car and apply brakes. RETURN TO WORK:  __________3-14 days depending on job. _______________ Wanda Garcia should see your doctor in the office for a follow-up appointment approximately two weeks after your surgery.  Your doctor's nurse will typically make your follow-up appointment when she calls you with your pathology report.  Expect your pathology report 3-4 business days after your surgery.  You may call to check if you do not hear from us  after three days.   WHEN TO CALL YOUR DOCTOR: Fever over 101.0 Nausea and/or vomiting. Extreme swelling or bruising. Continued bleeding from incision. Increased pain, redness, or drainage from the incision.  The clinic staff is available to answer your questions during regular business hours.  Please don't hesitate to call and ask to speak to one of the nurses for clinical concerns.  If you have a medical emergency, go to the nearest emergency room or call 911.  A surgeon from [facility] is always on call at the hospital.  For further questions, please visit [email or web address]

## 2024-09-14 NOTE — Transfer of Care (Signed)
 Immediate Anesthesia Transfer of Care Note  Patient: Wanda Garcia  Procedure(s) Performed: LYMPHADENECTOMY, AXILLARY (Left: Axilla)  Patient Location: PACU  Anesthesia Type:General  Level of Consciousness: drowsy and responds to stimulation  Airway & Oxygen Therapy: Patient Spontanous Breathing  Post-op Assessment: Report given to RN and Post -op Vital signs reviewed and stable  Post vital signs: Reviewed and stable  Last Vitals:  Vitals Value Taken Time  BP 153/84 09/14/24 14:36  Temp    Pulse 93 09/14/24 14:38  Resp 18 09/14/24 14:38  SpO2 98 % 09/14/24 14:38  Vitals shown include unfiled device data.  Last Pain:  Vitals:   09/14/24 0921  PainSc: 0-No pain         Complications: No notable events documented.

## 2024-09-15 ENCOUNTER — Encounter (HOSPITAL_COMMUNITY): Payer: Self-pay | Admitting: General Surgery

## 2024-09-16 ENCOUNTER — Ambulatory Visit: Admitting: Radiation Oncology

## 2024-09-20 ENCOUNTER — Encounter: Payer: Self-pay | Admitting: *Deleted

## 2024-09-20 LAB — SURGICAL PATHOLOGY

## 2024-09-21 NOTE — Progress Notes (Incomplete)
 Location of Breast Cancer: Malignant Neoplasm of Upper-Outer Quadrant of Left Breast, Estrogen Receptor Positive  Histology per Pathology Report:    Receptor Status: ER(Strongly Positive), PR (Strongly Positive), Her2-neu (Negative), Ki-67(15%)  Did patient present with symptoms (if so, please note symptoms) or was this found on screening mammography?:  Mammogram    Past/Anticipated interventions by surgeon, if any:  09/12/2024 Aron, MD   Past/Anticipated interventions by medical oncology, if any: Chemotherapy ***  Lymphedema issues, if any:  {:18581} {t:21944}   Pain issues, if any:  {:18581} {PAIN DESCRIPTION:21022940}  SAFETY ISSUES: Prior radiation? {:18581} Pacemaker/ICD? {:18581} Possible current pregnancy?{:18581} Is the patient on methotrexate? {:18581}  Current Complaints / other details:  ***

## 2024-09-22 MED ORDER — BUPIVACAINE-EPINEPHRINE (PF) 0.5% -1:200000 IJ SOLN
INTRAMUSCULAR | Status: DC | PRN
  Administered 2024-09-14: 25 mL via PERINEURAL

## 2024-09-22 MED ORDER — LIDOCAINE-EPINEPHRINE (PF) 1.5 %-1:200000 IJ SOLN
INTRAMUSCULAR | Status: DC | PRN
  Administered 2024-09-14: 5 mL via PERINEURAL

## 2024-09-22 NOTE — Anesthesia Procedure Notes (Signed)
 Anesthesia Regional Block: Pectoralis block   Pre-Anesthetic Checklist: , timeout performed,  Correct Patient, Correct Site, Correct Laterality,  Correct Procedure, Correct Position, site marked,  Risks and benefits discussed,  Surgical consent,  Pre-op evaluation,  At surgeon's request and post-op pain management  Laterality: Left  Prep: chloraprep       Needles:  Injection technique: Single-shot      Needle Length: 9cm  Needle Gauge: 22     Additional Needles: Arrow StimuQuik ECHO Echogenic Stimulating PNB Needle  Procedures:,,,, ultrasound used (permanent image in chart),,    Narrative:  Start time: 09/14/2024 2:08 PM End time: 09/14/2024 2:14 PM Injection made incrementally with aspirations every 5 mL.  Performed by: Personally  Anesthesiologist: Leopoldo Bruckner, MD

## 2024-09-22 NOTE — Anesthesia Postprocedure Evaluation (Signed)
 Anesthesia Post Note  Patient: Wanda Garcia  Procedure(s) Performed: LYMPHADENECTOMY, AXILLARY (Left: Axilla)     Patient location during evaluation: PACU Anesthesia Type: Regional Level of consciousness: awake and alert Pain management: pain level controlled Vital Signs Assessment: post-procedure vital signs reviewed and stable Respiratory status: spontaneous breathing, nonlabored ventilation, respiratory function stable and patient connected to nasal cannula oxygen Cardiovascular status: blood pressure returned to baseline and stable Postop Assessment: no apparent nausea or vomiting Anesthetic complications: no   No notable events documented.                  Shirl Weir

## 2024-09-23 NOTE — Progress Notes (Signed)
 Pt s/p L axillary lymphadenectomy 09/14/24. Pt's output today (11/7) has been 25 cc so far, yesterday's (11/6) total was 25 cc, and pt was unable to give me output numbers for 11/5. Output quality was serosanguinous, not milky and no pus, no signs of redness around drain site, no swelling, no warmth to touch, but pt did have a fever of 101.2 F and was having chills. She is also having pain at drain site. Fever started this morning. I advised on no signs of infection so pt should go to urgent care for flu/covid test. I consulted with Puja, PA on this decision who agreed as well. Advised to pt once Dr. Aron was back in clinic on Monday, I will reach out to check on pt. Also advised if symptoms got worse over the weekend to go the the ER. Pt verbalized understanding.

## 2024-09-24 ENCOUNTER — Inpatient Hospital Stay (HOSPITAL_COMMUNITY)
Admission: EM | Admit: 2024-09-24 | Discharge: 2024-10-04 | DRG: 862 | Disposition: A | Discharge status: Home / Self Care | Source: Ambulatory Visit | Attending: Emergency Medicine | Admitting: Emergency Medicine

## 2024-09-24 ENCOUNTER — Emergency Department (HOSPITAL_COMMUNITY)

## 2024-09-24 ENCOUNTER — Encounter (HOSPITAL_COMMUNITY): Payer: Self-pay

## 2024-09-24 ENCOUNTER — Other Ambulatory Visit: Payer: Self-pay

## 2024-09-24 DIAGNOSIS — S2002XA Contusion of left breast, initial encounter: Secondary | ICD-10-CM | POA: Diagnosis present

## 2024-09-24 DIAGNOSIS — Z8249 Family history of ischemic heart disease and other diseases of the circulatory system: Secondary | ICD-10-CM

## 2024-09-24 DIAGNOSIS — E872 Acidosis, unspecified: Secondary | ICD-10-CM | POA: Diagnosis present

## 2024-09-24 DIAGNOSIS — Z603 Acculturation difficulty: Secondary | ICD-10-CM | POA: Diagnosis present

## 2024-09-24 DIAGNOSIS — N61 Mastitis without abscess: Secondary | ICD-10-CM | POA: Diagnosis present

## 2024-09-24 DIAGNOSIS — D638 Anemia in other chronic diseases classified elsewhere: Secondary | ICD-10-CM | POA: Diagnosis present

## 2024-09-24 DIAGNOSIS — N179 Acute kidney failure, unspecified: Secondary | ICD-10-CM

## 2024-09-24 DIAGNOSIS — A419 Sepsis, unspecified organism: Secondary | ICD-10-CM | POA: Diagnosis present

## 2024-09-24 DIAGNOSIS — Z91014 Allergy to mammalian meats: Secondary | ICD-10-CM | POA: Diagnosis not present

## 2024-09-24 DIAGNOSIS — L0291 Cutaneous abscess, unspecified: Secondary | ICD-10-CM | POA: Diagnosis not present

## 2024-09-24 DIAGNOSIS — Z853 Personal history of malignant neoplasm of breast: Secondary | ICD-10-CM

## 2024-09-24 DIAGNOSIS — L03112 Cellulitis of left axilla: Secondary | ICD-10-CM | POA: Diagnosis not present

## 2024-09-24 DIAGNOSIS — T148XXA Other injury of unspecified body region, initial encounter: Secondary | ICD-10-CM | POA: Diagnosis present

## 2024-09-24 DIAGNOSIS — Z9221 Personal history of antineoplastic chemotherapy: Secondary | ICD-10-CM | POA: Diagnosis not present

## 2024-09-24 DIAGNOSIS — F17210 Nicotine dependence, cigarettes, uncomplicated: Secondary | ICD-10-CM | POA: Diagnosis present

## 2024-09-24 DIAGNOSIS — Z885 Allergy status to narcotic agent status: Secondary | ICD-10-CM | POA: Diagnosis not present

## 2024-09-24 DIAGNOSIS — A4101 Sepsis due to Methicillin susceptible Staphylococcus aureus: Secondary | ICD-10-CM | POA: Diagnosis not present

## 2024-09-24 DIAGNOSIS — A4902 Methicillin resistant Staphylococcus aureus infection, unspecified site: Secondary | ICD-10-CM | POA: Diagnosis not present

## 2024-09-24 DIAGNOSIS — Z608 Other problems related to social environment: Secondary | ICD-10-CM | POA: Diagnosis present

## 2024-09-24 DIAGNOSIS — Z6834 Body mass index (BMI) 34.0-34.9, adult: Secondary | ICD-10-CM | POA: Diagnosis not present

## 2024-09-24 DIAGNOSIS — Y848 Other medical procedures as the cause of abnormal reaction of the patient, or of later complication, without mention of misadventure at the time of the procedure: Secondary | ICD-10-CM | POA: Diagnosis present

## 2024-09-24 DIAGNOSIS — R6520 Severe sepsis without septic shock: Secondary | ICD-10-CM | POA: Diagnosis present

## 2024-09-24 DIAGNOSIS — E66811 Obesity, class 1: Secondary | ICD-10-CM | POA: Diagnosis present

## 2024-09-24 DIAGNOSIS — L02414 Cutaneous abscess of left upper limb: Secondary | ICD-10-CM | POA: Diagnosis present

## 2024-09-24 DIAGNOSIS — Z833 Family history of diabetes mellitus: Secondary | ICD-10-CM

## 2024-09-24 DIAGNOSIS — T8141XA Infection following a procedure, superficial incisional surgical site, initial encounter: Principal | ICD-10-CM | POA: Diagnosis present

## 2024-09-24 DIAGNOSIS — L089 Local infection of the skin and subcutaneous tissue, unspecified: Principal | ICD-10-CM

## 2024-09-24 DIAGNOSIS — A403 Sepsis due to Streptococcus pneumoniae: Secondary | ICD-10-CM | POA: Diagnosis not present

## 2024-09-24 DIAGNOSIS — G7281 Critical illness myopathy: Secondary | ICD-10-CM | POA: Diagnosis not present

## 2024-09-24 LAB — URINALYSIS, W/ REFLEX TO CULTURE (INFECTION SUSPECTED)
Bilirubin Urine: NEGATIVE
Glucose, UA: NEGATIVE mg/dL
Ketones, ur: NEGATIVE mg/dL
Nitrite: NEGATIVE
Protein, ur: NEGATIVE mg/dL
Specific Gravity, Urine: 1.004 — ABNORMAL LOW (ref 1.005–1.030)
pH: 5 (ref 5.0–8.0)

## 2024-09-24 LAB — COMPREHENSIVE METABOLIC PANEL WITH GFR
ALT: 35 U/L (ref 0–44)
AST: 23 U/L (ref 15–41)
Albumin: 4.2 g/dL (ref 3.5–5.0)
Alkaline Phosphatase: 92 U/L (ref 38–126)
Anion gap: 12 (ref 5–15)
BUN: 13 mg/dL (ref 6–20)
CO2: 24 mmol/L (ref 22–32)
Calcium: 9.4 mg/dL (ref 8.9–10.3)
Chloride: 101 mmol/L (ref 98–111)
Creatinine, Ser: 0.73 mg/dL (ref 0.44–1.00)
GFR, Estimated: >60 mL/min (ref >60)
Glucose, Bld: 139 mg/dL — ABNORMAL HIGH (ref 70–99)
Potassium: 4.2 mmol/L (ref 3.5–5.1)
Sodium: 136 mmol/L (ref 135–145)
Total Bilirubin: 0.2 mg/dL (ref 0.0–1.2)
Total Protein: 7.6 g/dL (ref 6.5–8.1)

## 2024-09-24 LAB — I-STAT CG4 LACTIC ACID, ED
Lactic Acid, Venous: 2 mmol/L (ref 0.5–1.9)
Lactic Acid, Venous: 1.3 mmol/L (ref 0.5–1.9)

## 2024-09-24 LAB — CBC WITH DIFFERENTIAL/PLATELET
Abs Immature Granulocytes: 0.06 K/uL (ref 0.00–0.07)
Basophils Absolute: 0 K/uL (ref 0.0–0.1)
Basophils Relative: 0 %
Eosinophils Absolute: 0 K/uL (ref 0.0–0.5)
Eosinophils Relative: 0 %
HCT: 41.7 % (ref 36.0–46.0)
Hemoglobin: 13 g/dL (ref 12.0–15.0)
Immature Granulocytes: 0 %
Lymphocytes Relative: 13 %
Lymphs Abs: 1.9 K/uL (ref 0.7–4.0)
MCH: 24.6 pg — ABNORMAL LOW (ref 26.0–34.0)
MCHC: 31.2 g/dL (ref 30.0–36.0)
MCV: 79 fL — ABNORMAL LOW (ref 80.0–100.0)
Monocytes Absolute: 1.1 K/uL — ABNORMAL HIGH (ref 0.1–1.0)
Monocytes Relative: 7 %
Neutro Abs: 11.9 K/uL — ABNORMAL HIGH (ref 1.7–7.7)
Neutrophils Relative %: 80 %
Platelets: 382 K/uL (ref 150–400)
RBC: 5.28 MIL/uL — ABNORMAL HIGH (ref 3.87–5.11)
RDW: 12.6 % (ref 11.5–15.5)
WBC: 15 K/uL — ABNORMAL HIGH (ref 4.0–10.5)
nRBC: 0 % (ref 0.0–0.2)

## 2024-09-24 LAB — RESP PANEL BY RT-PCR (RSV, FLU A&B, COVID)  RVPGX2
Influenza A by PCR: NEGATIVE
Influenza B by PCR: NEGATIVE
Resp Syncytial Virus by PCR: NEGATIVE
SARS Coronavirus 2 by RT PCR: NEGATIVE

## 2024-09-24 LAB — HCG, SERUM, QUALITATIVE: Preg, Serum: NEGATIVE

## 2024-09-24 LAB — PROTIME-INR
INR: 1.1 (ref 0.8–1.2)
Prothrombin Time: 15 s (ref 11.4–15.2)

## 2024-09-24 MED ORDER — MORPHINE SULFATE (PF) 2 MG/ML IV SOLN: 2.0000 mg | INTRAVENOUS | Status: DC | PRN

## 2024-09-24 MED ORDER — LACTATED RINGERS IV SOLN: INTRAVENOUS | Status: AC

## 2024-09-24 MED ORDER — LACTATED RINGERS IV BOLUS (SEPSIS)
1000.0000 mL | Freq: Once | INTRAVENOUS | Status: AC
Start: 2024-09-24 — End: 2024-09-24
  Administered 2024-09-24: 1000 mL via INTRAVENOUS

## 2024-09-24 MED ORDER — SODIUM CHLORIDE 0.9 % IV SOLN
2.0000 g | Freq: Once | INTRAVENOUS | Status: AC
  Administered 2024-09-24: 2 g via INTRAVENOUS
  Filled 2024-09-24 (×2): qty 12.5

## 2024-09-24 MED ORDER — LACTATED RINGERS IV BOLUS (SEPSIS)
250.0000 mL | Freq: Once | INTRAVENOUS | Status: AC
Start: 2024-09-24 — End: 2024-09-24
  Administered 2024-09-24: 250 mL via INTRAVENOUS

## 2024-09-24 MED ORDER — ONDANSETRON HCL 4 MG/2ML IJ SOLN
4.0000 mg | Freq: Once | INTRAMUSCULAR | Status: AC
  Administered 2024-09-24: 4 mg via INTRAVENOUS
  Filled 2024-09-24: qty 2

## 2024-09-24 MED ORDER — METRONIDAZOLE 500 MG/100ML IV SOLN
500.0000 mg | Freq: Once | INTRAVENOUS | Status: AC
  Administered 2024-09-24: 500 mg via INTRAVENOUS
  Filled 2024-09-24: qty 100

## 2024-09-24 MED ORDER — POLYETHYLENE GLYCOL 3350 17 G PO PACK
17.0000 g | PACK | Freq: Every day | ORAL | Status: DC | PRN
  Administered 2024-09-29 – 2024-10-03 (×3): 17 g via ORAL
  Filled 2024-09-24 (×3): qty 1

## 2024-09-24 MED ORDER — MORPHINE SULFATE (PF) 4 MG/ML IV SOLN: 4.0000 mg | Freq: Once | INTRAVENOUS | Status: DC

## 2024-09-24 MED ORDER — MORPHINE SULFATE (PF) 4 MG/ML IV SOLN
4.0000 mg | Freq: Once | INTRAVENOUS | Status: AC
  Administered 2024-09-24: 4 mg via INTRAVENOUS
  Filled 2024-09-24: qty 1

## 2024-09-24 MED ORDER — MELATONIN 5 MG PO TABS
5.0000 mg | ORAL_TABLET | Freq: Every evening | ORAL | Status: DC | PRN
Start: 2024-09-24 — End: 2024-10-04
  Administered 2024-09-25: 5 mg via ORAL
  Filled 2024-09-24: qty 1

## 2024-09-24 MED ORDER — VANCOMYCIN HCL IN DEXTROSE 1-5 GM/200ML-% IV SOLN
1000.0000 mg | Freq: Once | INTRAVENOUS | Status: AC
  Administered 2024-09-24: 1000 mg via INTRAVENOUS
  Filled 2024-09-24: qty 200

## 2024-09-24 MED ORDER — PROCHLORPERAZINE EDISYLATE 10 MG/2ML IJ SOLN: 5.0000 mg | Freq: Four times a day (QID) | INTRAMUSCULAR | Status: DC | PRN

## 2024-09-24 MED ORDER — ACETAMINOPHEN 500 MG PO TABS
500.0000 mg | ORAL_TABLET | Freq: Four times a day (QID) | ORAL | Status: DC | PRN
Start: 2024-09-24 — End: 2024-09-25
  Administered 2024-09-25 (×2): 500 mg via ORAL
  Filled 2024-09-24 (×2): qty 1

## 2024-09-24 MED ORDER — MORPHINE SULFATE (PF) 4 MG/ML IV SOLN
4.0000 mg | INTRAVENOUS | Status: DC | PRN
  Administered 2024-09-25 – 2024-09-26 (×5): 4 mg via INTRAVENOUS
  Filled 2024-09-24 (×5): qty 1

## 2024-09-24 MED ORDER — OXYCODONE HCL 5 MG PO TABS
5.0000 mg | ORAL_TABLET | Freq: Four times a day (QID) | ORAL | Status: AC | PRN
  Administered 2024-09-25 – 2024-09-27 (×6): 5 mg via ORAL
  Filled 2024-09-24 (×6): qty 1

## 2024-09-24 MED ORDER — DIPHENHYDRAMINE HCL 50 MG/ML IJ SOLN
25.0000 mg | INTRAMUSCULAR | Status: AC
  Administered 2024-09-25: 25 mg via INTRAVENOUS
  Filled 2024-09-24: qty 1

## 2024-09-24 MED ORDER — IOHEXOL 300 MG/ML  SOLN
100.0000 mL | Freq: Once | INTRAMUSCULAR | Status: AC | PRN
  Administered 2024-09-24: 70 mL via INTRAVENOUS

## 2024-09-24 NOTE — Sepsis Progress Note (Signed)
 Elink monitoring for the code sepsis protocol.

## 2024-09-24 NOTE — ED Notes (Signed)
 Per Dr Shona, changed the infusion rate of the vancomycin to a slower rate due to red man syndrome.Rate changed to 100.

## 2024-09-24 NOTE — ED Triage Notes (Signed)
 Pt BIB EMS from urgent care. Symptoms of N/V; pt has breast cancer, lumpectomy and restriction on left side.  Per EMS, port site looks infected with drainage.  Tac hypnic: 30 Febril Tachycardic at 120 Temp at 102  Given tylenol  650mg  Zofram 4 mg IV in rt hand 20 End tidal 15-20

## 2024-09-24 NOTE — H&P (Addendum)
 History and Physical  Wanda Garcia FMW:[id number] DOB: [birth year] DOA: 09/24/2024  Referring physician: Hoy Fraction, PA-EDP  PCP: [facility]  Outpatient Specialists: Medical oncology, Dr. Odean. Patient coming from: Home through urgent care via EMS.  Chief Complaint: Nausea and vomiting.   HPI: Wanda Garcia is a 48 y.o. 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  Also endorses subjective fevers 101.2 at home and associated chills.  In the ER, febrile with Tmax 101.5, tachycardic in 120s, elevated lactic acid 2.0, leukocytosis 15,000.  Lateral left breast appear erythematous, tender, warm, with purulent drainage from drain site.  Code sepsis was called in the ER.  CT chest with contrast revealed postsurgical changes in the left axilla with generalized edema and surgical drain extending along the left chest wall.  Soft tissue density measuring approximately 7.0 x 3.8 cm posterior laterally within the left breast, likely representing a hematoma given recent surgery and separate from the drainage catheter.  Peripheral blood cultures x 2 were obtained.  The patient was started on broad-spectrum IV antibiotics for sepsis, IV vancomycin, cefepime, and IV Flagyl .  Admitted by [facility], hospitalist service.  ED Course: Temperature 101.5.  BP 114/56, pulse 127, respiration rate 13, O2 saturation 98% on room air.  Review of Systems: Review of systems as noted in the HPI. All other systems reviewed and are negative.   Past Medical History:  Diagnosis Date    Arthritis    Breast cancer (HCC) 03/2024   left   Depression    situational   Headache    Neuropathy 07/20/2024   in fingers and toes   Past Surgical History:  Procedure Laterality Date   AXILLARY LYMPH NODE DISSECTION Left 09/14/2024   Procedure: LYMPHADENECTOMY, AXILLARY;  Surgeon: Aron Shoulders, MD;  Location: [facility] OR;  Service: General;  Laterality: Left;  LEFT AXILLARY LYMPH NODE DISSECTION GEN w/PEC BLOCK   AXILLARY SENTINEL NODE BIOPSY Left 08/23/2024   Procedure: LEFT SENTINEL NODE BIOPSY AXILLARY;  Surgeon: Aron Shoulders, MD;  Location: [facility] OR;  Service: General;  Laterality: Left;  LEFT SENTINEL NODE BIOPSY   BREAST BIOPSY Left 03/2024   CESAREAN SECTION N/A 04/06/2017   Procedure: CESAREAN SECTION;  Surgeon: Herchel Gloris LABOR, MD;  Location: [facility];  Service: Obstetrics;  Laterality: N/A;   CHOLECYSTECTOMY N/A 04/26/2019   Procedure: LAPAROSCOPIC CHOLECYSTECTOMY WITH POSSIBLE INTRAOPERATIVE CHOLANGIOGRAM;  Surgeon: Vernetta Berg, MD;  Location: [facility] OR;  Service: General;  Laterality: N/A;   CYST EXCISION Right 2024   chest   PORT-A-CATH REMOVAL N/A 08/23/2024   Procedure: REMOVAL PORT-A-CATH;  Surgeon: Aron Shoulders, MD;  Location: [facility] OR;  Service: General;  Laterality: N/A;   PORTACATH PLACEMENT N/A 04/04/2024   Procedure: INSERTION, TUNNELED CENTRAL VENOUS DEVICE, WITH PORT;  Surgeon: Aron Shoulders, MD;  Location: [facility] OR;  Service: General;  Laterality: N/A;    Social History:  reports that she has been smoking cigarettes. She has never used smokeless tobacco. She reports that she does not drink alcohol and does not use drugs.   Allergies  Allergen Reactions   Porcine (Pork) Protein-Containing Drug Products     Pt is Muslim.  Family History  Problem Relation Age of Onset   Diabetes Father    Hypertension Father    Breast cancer Neg Hx       Prior to Admission medications   Medication Sig Start Date End Date Taking? Authorizing Provider   acetaminophen  (TYLENOL ) 325 MG tablet Take 650 mg by mouth every 6 (six) hours as needed for moderate pain (pain score 4-6) or headache.    [provider]  B Complex-C (B-COMPLEX WITH VITAMIN C) tablet Take 1 tablet by mouth in the morning.    [provider]  ibuprofen  (ADVIL ) 600 MG tablet Take 1 tablet (600 mg total) by mouth every 8 (eight) hours as needed for fever, headache or moderate pain (pain score 4-6). Take with food 08/23/24   Aron Shoulders, MD  oxyCODONE  (OXY IR/ROXICODONE ) 5 MG immediate release tablet Take 1 tablet (5 mg total) by mouth every 4 (four) hours as needed for severe pain (pain score 7-10). 09/14/24   Aron Shoulders, MD    Physical Exam: BP 118/68   Pulse (!) 115   Temp (!) 101.5 F (38.6 C) (Oral)   Resp (!) 27   LMP 04/24/2024 (Exact Date) Comment: no periods since chemo; UPT neg on 09/14/2024  SpO2 97%   General: 48 y.o. year-old female well developed well nourished in no acute distress.  Alert and oriented x3. Cardiovascular: Regular rate and rhythm with no rubs or gallops.  No thyromegaly or JVD noted.  No lower extremity edema. 2/4 pulses in all 4 extremities. Respiratory: Clear to auscultation with no wheezes or rales. Good inspiratory effort. Abdomen: Soft nontender nondistended with normal bowel sounds x4 quadrants. Muskuloskeletal: No cyanosis or clubbing noted bilaterally Neuro: CN II-XII intact, strength, sensation, reflexes Skin: No ulcerative lesions noted or rashes.  Left lateral breast erythematous, edematous, tender, warm. Psychiatry: Judgement and insight appear normal. Mood is appropriate for condition and setting          Labs on Admission:  Basic Metabolic Panel: Recent Labs  Lab 09/24/24 2012  NA 136  K 4.2  CL 101  CO2 24  GLUCOSE 139*  BUN 13  CREATININE 0.73  CALCIUM 9.4   Liver Function Tests: Recent Labs  Lab 09/24/24 2012  AST 23  ALT 35  ALKPHOS 92  BILITOT 0.2  PROT 7.6  ALBUMIN 4.2   No  results for input(s): LIPASE, AMYLASE in the last 168 hours. No results for input(s): AMMONIA in the last 168 hours. CBC: Recent Labs  Lab 09/24/24 2012  WBC 15.0*  NEUTROABS 11.9*  HGB 13.0  HCT 41.7  MCV 79.0*  PLT 382   Cardiac Enzymes: No results for input(s): CKTOTAL, CKMB, CKMBINDEX, TROPONINI in the last 168 hours.  BNP (last 3 results) No results for input(s): BNP in the last 8760 hours.  ProBNP (last 3 results) No results for input(s): PROBNP in the last 8760 hours.  CBG: No results for input(s): GLUCAP in the last 168 hours.  Radiological Exams on Admission: CT Chest W Contrast Result Date: 09/24/2024 EXAM: CT CHEST WITH CONTRAST 09/24/2024 09:35:42 PM TECHNIQUE: CT of the chest was performed with the administration of 70 mL of iohexol  (OMNIPAQUE ) 300 MG/ML solution. Multiplanar reformatted images are provided for review. Automated exposure control, iterative reconstruction, and/or weight based adjustment of the mA/kV was utilized to reduce the radiation dose to as low as reasonably achievable. COMPARISON: MRI of the breast from 07/21/2024 CLINICAL HISTORY: Chest wall mass, US /xray nondiagnostic. FINDINGS: MEDIASTINUM: Thoracic aorta appears  within normal limits. No cardiac enlargement is noted. Pulmonary artery appears within normal limits. The esophagus is within normal limits. LYMPH NODES: No mediastinal, hilar or axillary lymphadenopathy. LUNGS AND PLEURA: The lungs are well aerated bilaterally. No focal infiltrate. SOFT TISSUES/BONES: Bony structures are within normal limits. Postsurgical changes are noted in the left axilla consistent with the given clinical history. Generalized edema is noted in the operative site. A surgical drain is seen extending along the left chest wall. There is a soft tissue density which measures approximately 7.0 x 3.8 cm posterior laterally within the left breast likely representing a hematoma given the recent surgical  intervention. This is removed from the drainage catheter. UPPER ABDOMEN: Visualized upper abdomen shows changes of prior cholecystectomy. IMPRESSION: 1. Postsurgical changes in the left axilla with generalized edema and a surgical drain extending along the left chest wall. 2. Soft tissue density measuring approximately 7.0 x 3.8 cm posterolaterally within the left breast, likely representing a hematoma given recent surgery and separate from the drainage catheter. Electronically signed by: Oneil Devonshire MD 09/24/2024 09:52 PM EST RP Workstation: [id number]    EKG: I independently viewed the EKG done and my findings are as followed: Sinus tachycardia rate of 123.  Nonspecific ST changes.  QTc 457.       Assessment/Plan Present on Admission:  Sepsis (HCC)  Principal Problem:   Sepsis (HCC)  Sepsis with suspected drain site, port site infection, POA. Presented with leukocytosis 15K, tachycardia HR 127, tachypnea RR 27, purulent left axillary drain site, erythematous, tenderness, edema, warmth lateral left breast with concern for cellulitis. Follow peripheral blood cultures x 2 and MRSA screening test. Continue broad-spectrum IV antibiotics, IV vancomycin, IV Flagyl  and cefepime. Narrow down antibiotics when able Maintain MAP greater than 65 Continue IV fluid maintenance Monitor fever curve and WBCs. As needed analgesics As needed antiemetics.  Suspected red man syndrome after IV vancomycin infusion IV vancomycin post and restarted with reduced rate of infusion IV Benadryl  25 mg x 1 administered Continue to closely monitor-if no improvement can switch to IV linezolid.  Status post port removal, POA Status post left axillary lymphadenopathy on 09/14/2024 with drain, POA General Surgery consulted, Dr. Curvin, will see in consultation. As needed analgesics  Tobacco use disorder Recommend complete cessation of tobacco use  Left breast cancer status postchemotherapy Chemotherapy was  interrupted, about 2 months ago, due to chemo induced polyneuropathy. Follows with Dr. Odean outpatient Recommend close follow-up with medical oncology  Chemo-induced polyneuropathy Stopped chemotherapy about 2 months ago. No reported symptoms at this time Continue to closely monitor and treat as indicated.  Generalized weakness PT assessment Fall precautions.   Critical care time: 55 minutes.   DVT prophylaxis: SCDs.  The patient declined pork-derived subcu heparin  or subcu Lovenox.  Add DOAC as DVT prophylaxis, in the morning, if no surgical plan.  Code Status: Full code.  Family Communication: Updated the patient's husband via phone.  Disposition Plan: Admission to progressive care unit.  Consults called: None.  Admission status: Inpatient status.   Status is: Inpatient The patient requires at least 2 midnights for further evaluation and treatment of present condition.   Terry LOISE Hurst MD [facility] Pager [phone]  If 7PM-7AM, please contact night-coverage [email or web address] Password [facility]  09/24/2024, 10:46 PM

## 2024-09-24 NOTE — ED Provider Notes (Signed)
 [place] EMERGENCY DEPARTMENT AT [facility] Provider Note   CSN: [id number] Arrival date & time: 09/24/24  1931     Patient presents with: Emesis, Dizziness, and Generalized Body Aches   Wanda Garcia is a 48 y.o. female with PMHx OA, headaches, breast cancer s/p axillary lymphadenectomy 09/14/24 who presents to ED concerned for fever, surgical site pain, left breast erythema, and purulent drainage from drain site that has been developing over the past 3 days. Patient now endorsing nausea as well without vomiting, diarrhea, or abdominal pain.   Patient also noting some dysuria over the past couple of days.   Print production planner assisting with conversation.    Emesis Dizziness Associated symptoms: vomiting        Prior to Admission medications   Medication Sig Start Date End Date Taking? Authorizing Provider  acetaminophen  (TYLENOL ) 325 MG tablet Take 650 mg by mouth every 6 (six) hours as needed for moderate pain (pain score 4-6) or headache.    [provider]  B Complex-C (B-COMPLEX WITH VITAMIN C) tablet Take 1 tablet by mouth in the morning.    [provider]  ibuprofen  (ADVIL ) 600 MG tablet Take 1 tablet (600 mg total) by mouth every 8 (eight) hours as needed for fever, headache or moderate pain (pain score 4-6). Take with food 08/23/24   Aron Shoulders, MD  oxyCODONE  (OXY IR/ROXICODONE ) 5 MG immediate release tablet Take 1 tablet (5 mg total) by mouth every 4 (four) hours as needed for severe pain (pain score 7-10). 09/14/24   Aron Shoulders, MD    Allergies: Porcine (pork) protein-containing drug products    Review of Systems  Gastrointestinal:  Positive for vomiting.  Neurological:  Positive for dizziness.    Updated Vital Signs BP 118/68   Pulse (!) 115   Temp (!) 101.5 F (38.6 C) (Oral)   Resp (!) 27   LMP 04/24/2024 (Exact Date) Comment: no periods since chemo; UPT neg on 09/14/2024  SpO2 97%   Physical Exam Vitals and  nursing note reviewed.  Constitutional:      General: She is not in acute distress.    Appearance: She is not ill-appearing or toxic-appearing.  HENT:     Head: Normocephalic and atraumatic.     Mouth/Throat:     Mouth: Mucous membranes are moist.  Eyes:     General: No scleral icterus.       Right eye: No discharge.        Left eye: No discharge.     Conjunctiva/sclera: Conjunctivae normal.  Cardiovascular:     Rate and Rhythm: Regular rhythm. Tachycardia present.     Pulses: Normal pulses.     Heart sounds: Normal heart sounds. No murmur heard. Pulmonary:     Effort: Pulmonary effort is normal. No respiratory distress.     Breath sounds: Normal breath sounds. No wheezing, rhonchi or rales.  Abdominal:     General: Abdomen is flat. Bowel sounds are normal. There is no distension.     Palpations: Abdomen is soft. There is no mass.     Tenderness: There is no abdominal tenderness.  Musculoskeletal:     Right lower leg: No edema.     Left lower leg: No edema.  Skin:    General: Skin is warm and dry.     Findings: No rash.     Comments: Mild erythema of left breast. There is a small area of white drainage in drain tube.  Neurological:  General: No focal deficit present.     Mental Status: She is alert and oriented to person, place, and time. Mental status is at baseline.  Psychiatric:        Mood and Affect: Mood normal.        Behavior: Behavior normal.     (all labs ordered are listed, but only abnormal results are displayed) Labs Reviewed  COMPREHENSIVE METABOLIC PANEL WITH GFR - Abnormal; Notable for the following components:      Result Value   Glucose, Bld 139 (*)    All other components within normal limits  CBC WITH DIFFERENTIAL/PLATELET - Abnormal; Notable for the following components:   WBC 15.0 (*)    RBC 5.28 (*)    MCV 79.0 (*)    MCH 24.6 (*)    Neutro Abs 11.9 (*)    Monocytes Absolute 1.1 (*)    All other components within normal limits   URINALYSIS, W/ REFLEX TO CULTURE (INFECTION SUSPECTED) - Abnormal; Notable for the following components:   Color, Urine STRAW (*)    Specific Gravity, Urine 1.004 (*)    Hgb urine dipstick SMALL (*)    Leukocytes,Ua TRACE (*)    Bacteria, UA RARE (*)    All other components within normal limits  I-STAT CG4 LACTIC ACID, ED - Abnormal; Notable for the following components:   Lactic Acid, Venous 2.0 (*)    All other components within normal limits  RESP PANEL BY RT-PCR (RSV, FLU A&B, COVID)  RVPGX2  CULTURE, BLOOD (ROUTINE X 2)  CULTURE, BLOOD (ROUTINE X 2)  PROTIME-INR  HCG, SERUM, QUALITATIVE  I-STAT CG4 LACTIC ACID, ED    EKG: EKG Interpretation Date/Time:  Saturday September 24 2024 19:52:26 EST Ventricular Rate:  123 PR Interval:  146 QRS Duration:  81 QT Interval:  319 QTC Calculation: 457 R Axis:   39  Text Interpretation: Sinus tachycardia Low voltage, precordial leads when compared to prior, faster rate No STEMI Confirmed by Ginger Barefoot ([id number]) on 09/24/2024 7:58:38 PM  Radiology: CT Chest W Contrast Result Date: 09/24/2024 EXAM: CT CHEST WITH CONTRAST 09/24/2024 09:35:42 PM TECHNIQUE: CT of the chest was performed with the administration of 70 mL of iohexol  (OMNIPAQUE ) 300 MG/ML solution. Multiplanar reformatted images are provided for review. Automated exposure control, iterative reconstruction, and/or weight based adjustment of the mA/kV was utilized to reduce the radiation dose to as low as reasonably achievable. COMPARISON: MRI of the breast from 07/21/2024 CLINICAL HISTORY: Chest wall mass, US maximiano nondiagnostic. FINDINGS: MEDIASTINUM: Thoracic aorta appears within normal limits. No cardiac enlargement is noted. Pulmonary artery appears within normal limits. The esophagus is within normal limits. LYMPH NODES: No mediastinal, hilar or axillary lymphadenopathy. LUNGS AND PLEURA: The lungs are well aerated bilaterally. No focal infiltrate. SOFT TISSUES/BONES: Bony  structures are within normal limits. Postsurgical changes are noted in the left axilla consistent with the given clinical history. Generalized edema is noted in the operative site. A surgical drain is seen extending along the left chest wall. There is a soft tissue density which measures approximately 7.0 x 3.8 cm posterior laterally within the left breast likely representing a hematoma given the recent surgical intervention. This is removed from the drainage catheter. UPPER ABDOMEN: Visualized upper abdomen shows changes of prior cholecystectomy. IMPRESSION: 1. Postsurgical changes in the left axilla with generalized edema and a surgical drain extending along the left chest wall. 2. Soft tissue density measuring approximately 7.0 x 3.8 cm posterolaterally within the left breast, likely representing a  hematoma given recent surgery and separate from the drainage catheter. Electronically signed by: Oneil Devonshire MD 09/24/2024 09:52 PM EST RP Workstation: [id number]     .Critical Care  Performed by: Hoy Nidia FALCON, PA-C Authorized by: Hoy Nidia FALCON, PA-C   Critical care provider statement:    Critical care time (minutes):  30   Critical care was necessary to treat or prevent imminent or life-threatening deterioration of the following conditions:  Sepsis   Critical care was time spent personally by me on the following activities:  Development of treatment plan with patient or surrogate, discussions with consultants, evaluation of patient's response to treatment, examination of patient, ordering and review of laboratory studies, ordering and review of radiographic studies, ordering and performing treatments and interventions, pulse oximetry, re-evaluation of patient's condition and review of old charts   Care discussed with: admitting provider      Medications Ordered in the ED  lactated ringers  infusion ( Intravenous New Bag/Given 09/24/24 2041)  vancomycin (VANCOCIN) IVPB 1000 mg/200 mL  premix (has no administration in time range)  lactated ringers  bolus 1,000 mL (0 mLs Intravenous Stopped 09/24/24 2143)    And  lactated ringers  bolus 1,000 mL (0 mLs Intravenous Stopped 09/24/24 2143)    And  lactated ringers  bolus 250 mL (0 mLs Intravenous Stopped 09/24/24 2156)  ceFEPIme (MAXIPIME) 2 g in sodium chloride  0.9 % 100 mL IVPB (0 g Intravenous Stopped 09/24/24 2108)  metroNIDAZOLE  (FLAGYL ) IVPB 500 mg (500 mg Intravenous New Bag/Given 09/24/24 2105)  ondansetron  (ZOFRAN ) injection 4 mg (4 mg Intravenous Given 09/24/24 2031)  morphine  (PF) 4 MG/ML injection 4 mg (4 mg Intravenous Given 09/24/24 2054)  iohexol  (OMNIPAQUE ) 300 MG/ML solution 100 mL (70 mLs Intravenous Contrast Given 09/24/24 2127)                                    Medical Decision Making Amount and/or Complexity of Data Reviewed Labs: ordered. Radiology: ordered.  Risk Prescription drug management. Decision regarding hospitalization.   This patient presents to the ED for concern of erythema and purulent drainage, this involves an extensive number of treatment options, and is a complaint that carries with it a high risk of complications and morbidity.  The differential diagnosis includes irritant contact dermatitis, DRESS, atopic dermatitis, anaphylaxis, SJS/TEN   Co morbidities that complicate the patient evaluation  OA, headaches, breast cancer s/p axillary lymphadenectomy 09/14/24    Additional history obtained:  Additional history obtained from 11/7 surgery note: Pt s/p L axillary lymphadenectomy 09/14/24. Pt's output today (11/7) has been 25 cc so far, yesterday's (11/6) total was 25 cc, and pt was unable to give me output numbers for 11/5. Output quality was serosanguinous, not milky and no pus, no signs of redness around drain site, no swelling, no warmth to touch, but pt did have a fever of 101.2 F and was having chills. She is also having pain at drain site. Fever started this morning. I advised on  no signs of infection so pt should go to urgent care for flu/covid test. I consulted with Puja, PA on this decision who agreed as well. Advised to pt once Dr. Aron was back in clinic on Monday, I will reach out to check on pt. Also advised if symptoms got worse over the weekend to go the the ER. Pt verbalized understanding. 03/2024 ECHO: 60-65% EF   Problem List / ED Course / Critical interventions /  Medication management  Patient presents to ED concerned for fever, erythema and purulent drainage of left surgical site, and nausea developing over the past 3 days. Patient also mentioning dysuria but denies abdominal pain. Patient meeting sepsis criteria with fever of 101.59F and tachycardia of 121 upon arrival. IV fluids and broad spectrum ABX immediately began. I Ordered, and personally interpreted labs.  hCG negative.  Initial lactic acid 2.0 down trending to 1.3 with IV fluids.  CBC with leukocytosis at 15.0.  No anemia.  CMP with hyperglycemia at 139.  UA not concerning for infection.  PT/INR within normal limits.  Blood cultures pending.  Respiratory panel negative. I ordered imaging studies including CT chest which showed postsurgical changes and likely hematoma given recent surgery.  The patient was maintained on a cardiac monitor.  I personally viewed and interpreted the cardiac monitored which showed an underlying rhythm of: Sinus tachycardia. Given that patient meets sepsis criteria with physical exam concerning for possible developing wound infection, I believe its in her best interest to admit for further IV ABX. Patient agreeable with plan. I requested consultation with the general surgery provider on-call Dr. Curvin,  and discussed lab and imaging findings as well as pertinent plan - they agree with IV ABX and hospitalist admission. Dr. Shona admitting provider. I have reviewed the patients home medicines and have made adjustments as needed   Social Determinants of Health:  Foreign  language       Final diagnoses:  Wound infection    ED Discharge Orders     None          Hoy Nidia FALCON, [place] 09/24/24 2242    Tegeler, Lonni PARAS, MD 09/24/24 [phone]

## 2024-09-24 NOTE — H&P (Incomplete)
 History and Physical  Wanda Garcia FMW:[id number] DOB: [birth year] DOA: 09/24/2024  Referring physician: Hoy Fraction, PA-EDP  PCP: [facility]  Outpatient Specialists: Medical oncology, Dr. Odean. Patient coming from: Home through urgent care via EMS.  Chief Complaint: Nausea and vomiting.   HPI: Wanda Garcia is a 48 y.o. 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  Also endorses subjective fevers 101.2 at home and associated chills.  In the ER, febrile with Tmax 101.5, tachycardic in 120s, elevated lactic acid 2.0, leukocytosis 15,000.  Lateral left breast appear erythematous, tender, warm, with purulent drainage from drain site.  Code sepsis was called in the ER.  CT chest with contrast revealed postsurgical changes in the left axilla with generalized edema and surgical drain extending along the left chest wall.  Soft tissue density measuring approximately 7.0 x 3.8 cm posterior laterally within the left breast, likely representing a hematoma given recent surgery and separate from the drainage catheter.  Peripheral blood cultures x 2 were obtained.  The patient was started on broad-spectrum IV antibiotics for sepsis, IV vancomycin, cefepime, and IV Flagyl .  Admitted by [facility], hospitalist service.  ED Course: Temperature 101.5.  BP 114/56, pulse 127, respiration rate 13, O2 saturation 98% on room air.  Review of Systems: Review of systems as noted in the HPI. All other systems reviewed and are negative.   Past Medical History:  Diagnosis Date    Arthritis    Breast cancer (HCC) 03/2024   left   Depression    situational   Headache    Neuropathy 07/20/2024   in fingers and toes   Past Surgical History:  Procedure Laterality Date   AXILLARY LYMPH NODE DISSECTION Left 09/14/2024   Procedure: LYMPHADENECTOMY, AXILLARY;  Surgeon: Aron Shoulders, MD;  Location: [facility] OR;  Service: General;  Laterality: Left;  LEFT AXILLARY LYMPH NODE DISSECTION GEN w/PEC BLOCK   AXILLARY SENTINEL NODE BIOPSY Left 08/23/2024   Procedure: LEFT SENTINEL NODE BIOPSY AXILLARY;  Surgeon: Aron Shoulders, MD;  Location: [facility] OR;  Service: General;  Laterality: Left;  LEFT SENTINEL NODE BIOPSY   BREAST BIOPSY Left 03/2024   CESAREAN SECTION N/A 04/06/2017   Procedure: CESAREAN SECTION;  Surgeon: Herchel Gloris LABOR, MD;  Location: [facility];  Service: Obstetrics;  Laterality: N/A;   CHOLECYSTECTOMY N/A 04/26/2019   Procedure: LAPAROSCOPIC CHOLECYSTECTOMY WITH POSSIBLE INTRAOPERATIVE CHOLANGIOGRAM;  Surgeon: Vernetta Berg, MD;  Location: [facility] OR;  Service: General;  Laterality: N/A;   CYST EXCISION Right 2024   chest   PORT-A-CATH REMOVAL N/A 08/23/2024   Procedure: REMOVAL PORT-A-CATH;  Surgeon: Aron Shoulders, MD;  Location: [facility] OR;  Service: General;  Laterality: N/A;   PORTACATH PLACEMENT N/A 04/04/2024   Procedure: INSERTION, TUNNELED CENTRAL VENOUS DEVICE, WITH PORT;  Surgeon: Aron Shoulders, MD;  Location: [facility] OR;  Service: General;  Laterality: N/A;    Social History:  reports that she has been smoking cigarettes. She has never used smokeless tobacco. She reports that she does not drink alcohol and does not use drugs.   Allergies  Allergen Reactions   Porcine (Pork) Protein-Containing Drug Products     Pt is Muslim.  Family History  Problem Relation Age of Onset   Diabetes Father    Hypertension Father    Breast cancer Neg Hx       Prior to Admission medications   Medication Sig Start Date End Date Taking? Authorizing Provider   acetaminophen  (TYLENOL ) 325 MG tablet Take 650 mg by mouth every 6 (six) hours as needed for moderate pain (pain score 4-6) or headache.    [provider]  B Complex-C (B-COMPLEX WITH VITAMIN C) tablet Take 1 tablet by mouth in the morning.    [provider]  ibuprofen  (ADVIL ) 600 MG tablet Take 1 tablet (600 mg total) by mouth every 8 (eight) hours as needed for fever, headache or moderate pain (pain score 4-6). Take with food 08/23/24   Aron Shoulders, MD  oxyCODONE  (OXY IR/ROXICODONE ) 5 MG immediate release tablet Take 1 tablet (5 mg total) by mouth every 4 (four) hours as needed for severe pain (pain score 7-10). 09/14/24   Aron Shoulders, MD    Physical Exam: BP 118/68   Pulse (!) 115   Temp (!) 101.5 F (38.6 C) (Oral)   Resp (!) 27   LMP 04/24/2024 (Exact Date) Comment: no periods since chemo; UPT neg on 09/14/2024  SpO2 97%   General: 48 y.o. year-old female well developed well nourished in no acute distress.  Alert and oriented x3. Cardiovascular: Regular rate and rhythm with no rubs or gallops.  No thyromegaly or JVD noted.  No lower extremity edema. 2/4 pulses in all 4 extremities. Respiratory: Clear to auscultation with no wheezes or rales. Good inspiratory effort. Abdomen: Soft nontender nondistended with normal bowel sounds x4 quadrants. Muskuloskeletal: No cyanosis or clubbing noted bilaterally Neuro: CN II-XII intact, strength, sensation, reflexes Skin: No ulcerative lesions noted or rashes.  Left lateral breast erythematous, edematous, tender, warm. Psychiatry: Judgement and insight appear normal. Mood is appropriate for condition and setting          Labs on Admission:  Basic Metabolic Panel: Recent Labs  Lab 09/24/24 2012  NA 136  K 4.2  CL 101  CO2 24  GLUCOSE 139*  BUN 13  CREATININE 0.73  CALCIUM 9.4   Liver Function Tests: Recent Labs  Lab 09/24/24 2012  AST 23  ALT 35  ALKPHOS 92  BILITOT 0.2  PROT 7.6  ALBUMIN 4.2   No  results for input(s): LIPASE, AMYLASE in the last 168 hours. No results for input(s): AMMONIA in the last 168 hours. CBC: Recent Labs  Lab 09/24/24 2012  WBC 15.0*  NEUTROABS 11.9*  HGB 13.0  HCT 41.7  MCV 79.0*  PLT 382   Cardiac Enzymes: No results for input(s): CKTOTAL, CKMB, CKMBINDEX, TROPONINI in the last 168 hours.  BNP (last 3 results) No results for input(s): BNP in the last 8760 hours.  ProBNP (last 3 results) No results for input(s): PROBNP in the last 8760 hours.  CBG: No results for input(s): GLUCAP in the last 168 hours.  Radiological Exams on Admission: CT Chest W Contrast Result Date: 09/24/2024 EXAM: CT CHEST WITH CONTRAST 09/24/2024 09:35:42 PM TECHNIQUE: CT of the chest was performed with the administration of 70 mL of iohexol  (OMNIPAQUE ) 300 MG/ML solution. Multiplanar reformatted images are provided for review. Automated exposure control, iterative reconstruction, and/or weight based adjustment of the mA/kV was utilized to reduce the radiation dose to as low as reasonably achievable. COMPARISON: MRI of the breast from 07/21/2024 CLINICAL HISTORY: Chest wall mass, US /xray nondiagnostic. FINDINGS: MEDIASTINUM: Thoracic aorta appears  within normal limits. No cardiac enlargement is noted. Pulmonary artery appears within normal limits. The esophagus is within normal limits. LYMPH NODES: No mediastinal, hilar or axillary lymphadenopathy. LUNGS AND PLEURA: The lungs are well aerated bilaterally. No focal infiltrate. SOFT TISSUES/BONES: Bony structures are within normal limits. Postsurgical changes are noted in the left axilla consistent with the given clinical history. Generalized edema is noted in the operative site. A surgical drain is seen extending along the left chest wall. There is a soft tissue density which measures approximately 7.0 x 3.8 cm posterior laterally within the left breast likely representing a hematoma given the recent surgical  intervention. This is removed from the drainage catheter. UPPER ABDOMEN: Visualized upper abdomen shows changes of prior cholecystectomy. IMPRESSION: 1. Postsurgical changes in the left axilla with generalized edema and a surgical drain extending along the left chest wall. 2. Soft tissue density measuring approximately 7.0 x 3.8 cm posterolaterally within the left breast, likely representing a hematoma given recent surgery and separate from the drainage catheter. Electronically signed by: Oneil Devonshire MD 09/24/2024 09:52 PM EST RP Workstation: [id number]    EKG: I independently viewed the EKG done and my findings are as followed: Sinus tachycardia rate of 123.  Nonspecific ST changes.  QTc 457.       Assessment/Plan Present on Admission:  Sepsis (HCC)  Principal Problem:   Sepsis (HCC)  Sepsis with suspected drain site, port site infection, POA. Presented with leukocytosis 15K, tachycardia HR 127, tachypnea RR 27, purulent left axillary drain site, erythematous, tenderness, edema, warmth lateral left breast with concern for cellulitis. Follow peripheral blood cultures x 2 and MRSA screening test. Continue broad-spectrum IV antibiotics, IV vancomycin, IV Flagyl  and cefepime. Narrow down antibiotics when able Maintain MAP greater than 65 Continue IV fluid maintenance Monitor fever curve and WBCs. As needed analgesics As needed antiemetics.  Suspected red man syndrome after IV vancomycin infusion IV vancomycin post and restarted with reduced rate of infusion IV Benadryl  25 mg x 1 administered Continue to closely monitor-if no improvement can switch to IV linezolid.  Status post port removal, POA Status post left axillary lymphadenopathy on 09/14/2024 with drain, POA General Surgery consulted, Dr. Curvin, will see in consultation. As needed analgesics  Tobacco use disorder Recommend complete cessation of tobacco use  Left breast cancer status postchemotherapy Chemotherapy was  interrupted, about 2 months ago, due to chemo induced polyneuropathy. Follows with Dr. Odean outpatient Recommend close follow-up with medical oncology  Chemo-induced polyneuropathy Stopped chemotherapy about 2 months ago. No reported symptoms at this time Continue to closely monitor and treat as indicated.  Generalized weakness PT assessment Fall precautions.   Critical care time: 55 minutes.   DVT prophylaxis: SCDs.  The patient declined pork-derived subcu heparin  or subcu Lovenox.  Add DOAC as DVT prophylaxis, in the morning, if no surgical plan.  Code Status: Full code.  Family Communication: Updated the patient's husband via phone.  Disposition Plan: Admission to progressive care unit.  Consults called: None.  Admission status: Inpatient status.   Status is: Inpatient The patient requires at least 2 midnights for further evaluation and treatment of present condition.   Terry LOISE Hurst MD [facility] Pager [phone]  If 7PM-7AM, please contact night-coverage [email or web address] Password [facility]  09/24/2024, 10:46 PM

## 2024-09-25 ENCOUNTER — Encounter (HOSPITAL_COMMUNITY): Payer: Self-pay | Admitting: Internal Medicine

## 2024-09-25 DIAGNOSIS — R6520 Severe sepsis without septic shock: Secondary | ICD-10-CM | POA: Diagnosis not present

## 2024-09-25 DIAGNOSIS — N179 Acute kidney failure, unspecified: Secondary | ICD-10-CM | POA: Diagnosis not present

## 2024-09-25 DIAGNOSIS — A419 Sepsis, unspecified organism: Secondary | ICD-10-CM | POA: Diagnosis not present

## 2024-09-25 LAB — BASIC METABOLIC PANEL WITH GFR
Anion gap: 8 (ref 5–15)
BUN: 7 mg/dL (ref 6–20)
CO2: 26 mmol/L (ref 22–32)
Calcium: 8.3 mg/dL — ABNORMAL LOW (ref 8.9–10.3)
Chloride: 105 mmol/L (ref 98–111)
Creatinine, Ser: 0.57 mg/dL (ref 0.44–1.00)
GFR, Estimated: >60 mL/min (ref >60)
Glucose, Bld: 93 mg/dL (ref 70–99)
Potassium: 4 mmol/L (ref 3.5–5.1)
Sodium: 138 mmol/L (ref 135–145)

## 2024-09-25 LAB — CBC WITH DIFFERENTIAL/PLATELET
Abs Immature Granulocytes: 0.09 K/uL — ABNORMAL HIGH (ref 0.00–0.07)
Basophils Absolute: 0 K/uL (ref 0.0–0.1)
Basophils Relative: 0 %
Eosinophils Absolute: 0.2 K/uL (ref 0.0–0.5)
Eosinophils Relative: 1 %
HCT: 37.8 % (ref 36.0–46.0)
Hemoglobin: 11.6 g/dL — ABNORMAL LOW (ref 12.0–15.0)
Immature Granulocytes: 1 %
Lymphocytes Relative: 12 %
Lymphs Abs: 1.7 K/uL (ref 0.7–4.0)
MCH: 24.7 pg — ABNORMAL LOW (ref 26.0–34.0)
MCHC: 30.7 g/dL (ref 30.0–36.0)
MCV: 80.6 fL (ref 80.0–100.0)
Monocytes Absolute: 1 K/uL (ref 0.1–1.0)
Monocytes Relative: 7 %
Neutro Abs: 11.9 K/uL — ABNORMAL HIGH (ref 1.7–7.7)
Neutrophils Relative %: 79 %
Platelets: 300 K/uL (ref 150–400)
RBC: 4.69 MIL/uL (ref 3.87–5.11)
RDW: 12.8 % (ref 11.5–15.5)
WBC: 14.9 K/uL — ABNORMAL HIGH (ref 4.0–10.5)
nRBC: 0 % (ref 0.0–0.2)

## 2024-09-25 LAB — HIV ANTIBODY (ROUTINE TESTING W REFLEX): HIV Screen 4th Generation wRfx: NONREACTIVE

## 2024-09-25 LAB — PHOSPHORUS: Phosphorus: 3.3 mg/dL (ref 2.5–4.6)

## 2024-09-25 LAB — MAGNESIUM: Magnesium: 2 mg/dL (ref 1.7–2.4)

## 2024-09-25 MED ORDER — LINEZOLID 600 MG/300ML IV SOLN
600.0000 mg | Freq: Two times a day (BID) | INTRAVENOUS | Status: DC
  Administered 2024-09-25 – 2024-09-30 (×11): 600 mg via INTRAVENOUS
  Filled 2024-09-25 (×13): qty 300

## 2024-09-25 MED ORDER — ORAL CARE MOUTH RINSE: 15.0000 mL | OROMUCOSAL | Status: DC | PRN

## 2024-09-25 MED ORDER — SODIUM CHLORIDE 0.9 % IV SOLN
2.0000 g | Freq: Three times a day (TID) | INTRAVENOUS | Status: DC
  Administered 2024-09-25 – 2024-09-30 (×15): 2 g via INTRAVENOUS
  Filled 2024-09-25 (×15): qty 12.5

## 2024-09-25 MED ORDER — LACTATED RINGERS IV SOLN: INTRAVENOUS | Status: AC

## 2024-09-25 MED ORDER — ACETAMINOPHEN 325 MG PO TABS
650.0000 mg | ORAL_TABLET | Freq: Four times a day (QID) | ORAL | Status: AC | PRN
  Administered 2024-09-26 (×2): 650 mg via ORAL
  Filled 2024-09-25 (×3): qty 2

## 2024-09-25 MED ORDER — METRONIDAZOLE 500 MG/100ML IV SOLN
500.0000 mg | Freq: Two times a day (BID) | INTRAVENOUS | Status: DC
  Administered 2024-09-25 – 2024-09-30 (×11): 500 mg via INTRAVENOUS
  Filled 2024-09-25 (×11): qty 100

## 2024-09-25 NOTE — Hospital Course (Addendum)
 Brief Narrative:  a 48 y.o. female with PMH of  for recently diagnosed left breast cancer 03/22/2024 status post chemotherapy (last chemotherapy was about 2 months ago-was stopped prematurely due to chemo induced neuropathy), anxiety, status post left axillary lymphadenopathy on 09/14/2024 with drain, status post port site removal, who presents to the ER sent from urgent care with concern for possible port site infection, generalized weakness and fatigue, nausea, and vomiting.  Patient was found to have sepsis secondary to left base cellulitis/drain site infection.  Had a left breast cancer status postlumpectomy, targeted sentinel node 10/7.  Recently underwent left axillary lymphadenectomy with drain status post port removal on 10/29.  Had left axillary washout on 09/28/2024.  In the hospitalization she was started on linezolid which she was tolerating well without any issues.  She did have a drain on the left side placed recently which is being managed by general surgery.  Will discharge her once cleared by general surgery on 2 weeks of Linezolid.   Assessment & Plan:    Severe sepsis POA from left base cellulitis/suspected drain site infection, port site infection, POA Left breast cancer s/p lumpectomy, targeted/sentinel node 10/7 and then completion ALND 10/29 Hematoma on the left breast Rule out bacteremia - Recent left axillary lymphadenopathy on 09/14/2024 with drain, s/p recent port site removal.  Left axillary washout 10/12.  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 Odean   Anemia likely multifactorial from acute illness and malignancy: Hemoglobin  stable.  Monitor   Class I Obesity w/ Body mass index is 34.4 kg/m.: Will benefit with PCP follow-up, weight loss,healthy lifestyle   Tobacco use disorder Cessation counseling     DVT prophylaxis: SCDs Start: 09/24/24 2245    Code Status: Full Code Family Communication:   Status is: Inpatient Remains inpatient appropriate because: Hopefully home once cleared bygeneral surgery   PT Follow up Recs:   Subjective:  Feels ok.   Examination:  General exam: Appears calm and comfortable  Respiratory system: Clear to auscultation. Respiratory effort normal. Cardiovascular system: S1 & S2 heard, RRR. No JVD, murmurs, rubs, gallops or clicks. No pedal edema. Gastrointestinal system: Abdomen is nondistended, soft and nontender. No organomegaly or masses felt. Normal bowel sounds heard. Central nervous system: Alert and oriented. No focal neurological deficits. Extremities: Symmetric 5 x 5 power. Skin: No rashes, lesions or ulcers Psychiatry: Judgement and insight appear normal. Mood & affect appropriate. Left axilla drain in place

## 2024-09-25 NOTE — Progress Notes (Signed)
   09/25/24 1559  Assess: MEWS Score  Temp (!) 103.2 F (39.6 C)  BP 112/67  MAP (mmHg) 81  Pulse Rate (!) 121  Resp 15  SpO2 99 %  O2 Device Room Air  Assess: MEWS Score  MEWS Temp 2  MEWS Systolic 0  MEWS Pulse 2  MEWS RR 0  MEWS LOC 0  MEWS Score 4  MEWS Score Color Red  Assess: if the MEWS score is Yellow or Red  Were vital signs accurate and taken at a resting state? Yes  Does the patient meet 2 or more of the SIRS criteria? Yes  Does the patient have a confirmed or suspected source of infection? Yes  MEWS guidelines implemented  No, previously red, continue vital signs every 4 hours  Assess: SIRS CRITERIA  SIRS Temperature  1  SIRS Respirations  0  SIRS Pulse 1  SIRS WBC 0  SIRS Score Sum  2

## 2024-09-25 NOTE — Progress Notes (Signed)
 Pt refused blood draw this morning. Explained the importance of it. She said enough and started crying. On call made aware.

## 2024-09-25 NOTE — Progress Notes (Signed)
 PROGRESS NOTE Wanda Garcia  FMW:[id number] DOB: September 13, 1976 DOA: 09/24/2024 PCP: [facility]  Brief Narrative/Hospital Course: Wanda Garcia is a 48 y.o. female with PMH of  for recently diagnosed left breast cancer 03/22/2024 status post chemotherapy (last chemotherapy was about 2 months ago-was stopped prematurely due to chemo induced neuropathy), anxiety, status post left axillary lymphadenopathy on 09/14/2024 with drain, status post port site removal, who presents to the ER sent from urgent care with concern for possible port site infection, generalized weakness and fatigue, nausea, and vomiting.  Also endorses subjective fevers 101.2 at home and associated chills.   In the ZM:Uzfezmjulmz 101.5. BP 114/56, pulse 127, respiration rate 13, O2 saturation 98% on room air. Labs-lactic acid 2.0, leukocytosis 15,000.  Lateral left breast appear erythematous, tender, warm, with purulent drainage from drain site CT chest with contrast : postsurgical changes in the left axilla with generalized edema and surgical drain extending along the left chest wall.  Soft tissue density measuring approximately 7.0 x 3.8 cm posterior laterally within the left breast, likely representing a hematoma given recent surgery and separate from the drainage catheter. Peripheral blood cultures x 2 were obtained.  The patient was started on broad-spectrum IV antibiotics for sepsis, IV vancomycin, cefepime, and IV Flagyl  and admitted    Subjective: Seen and examined today Feels little better but is still having ongoing pain in the left axillary area, small serosanguineous drain present Overnight refused morning lab- but drwan and wbc donw some, was  febrile up to 100.7, VSS-although mild tachycardia  Assessment and plan:  Sepsis POA suspected drain site, port site infection, POA Cellulitis of the left breast-despite having drain: S/p left axillary lymphadenopathy on 09/14/2024 with drain, status post port site  removal. VSS but with fever, lactic acid normalized.Follow-up blood culture Continue  Zyvox/Flagyl /cefepime-surgery input appreciated advised to continue antibiotics for cellulitis, patient's primary surgeon will follow-up tomorrow, Continue pain management, wound care antiemetics IV fluid hydration Recent Labs  Lab 09/24/24 2012 09/24/24 2021 09/24/24 2212 09/25/24 0819  WBC 15.0*  --   --  14.9*  LATICACIDVEN  --  2.0* 1.3  --     Suspected red man syndrome after IV vancomycin infusion IV vancomycin has been discontinued,     Tobacco use disorder Cessation counseling    Left breast cancer status postchemotherapy Chemo-induced polyneuropathy Chemotherapy was interrupted, about 2 months ago, due to chemo induced polyneuropathy. F/b Dr. Odean Muff I Obesity w/ Body mass index is 34.4 kg/m.: Will benefit with PCP follow-up, weight loss,healthy lifestyle  Generalized weakness:  PT Orders: Active  PT Follow up Rec:    DVT prophylaxis: SCDs Start: 09/24/24 2245 Code Status:   Code Status: Full Code Family Communication: plan of care discussed with patient and her husband at bedside. Patient status is: Remains hospitalized because of severity of illness Level of care: Progressive   Dispo: The patient is from: home            Anticipated disposition: TBD Objective: Vitals last 24 hrs: Vitals:   09/24/24 2352 09/25/24 0214 09/25/24 0400 09/25/24 0750  BP: 111/60  97/65 109/73  Pulse: (!) 108  (!) 109 99  Resp: 17     Temp: 98.4 F (36.9 C)  (!) 100.7 F (38.2 C) 99.6 F (37.6 C)  TempSrc: Oral  Oral Oral  SpO2: 100%  98% 97%  Weight:  79.9 kg    Height:  5' (1.524 m)      Physical Examination: General exam: alert  awake, oriented, older than stated age HEENT:Oral mucosa moist, Ear/Nose WNL grossly Respiratory system: Bilaterally clear BS,no use of accessory muscle Cardiovascular system: S1 & S2 +, No JVD. Gastrointestinal system: Abdomen soft,NT,ND,  BS+ Nervous System: Alert, awake, moving all extremities,and following commands. Extremities: extremities warm, leg edema neg Skin: Warm, no rashes, left axilla area with drain in place, tender drain site. MSK: Normal muscle bulk,tone, power   Medications reviewed:  Scheduled Meds: Continuous Infusions:  ceFEPime (MAXIPIME) IV 2 g (09/25/24 0424)   lactated ringers  150 mL/hr at 09/25/24 0655   linezolid (ZYVOX) IV 600 mg (09/25/24 1044)   metronidazole  500 mg (09/25/24 0911)   Diet: Diet Order             Diet regular Room service appropriate? Yes; Fluid consistency: Thin  Diet effective now                   Data Reviewed: I have personally reviewed following labs and imaging studies ( see epic result tab) CBC: Recent Labs  Lab 09/24/24 2012 09/25/24 0819  WBC 15.0* 14.9*  NEUTROABS 11.9* 11.9*  HGB 13.0 11.6*  HCT 41.7 37.8  MCV 79.0* 80.6  PLT 382 300   CMP: Recent Labs  Lab 09/24/24 2012 09/25/24 0819  NA 136 138  K 4.2 4.0  CL 101 105  CO2 24 26  GLUCOSE 139* 93  BUN 13 7  CREATININE 0.73 0.57  CALCIUM 9.4 8.3*  MG  --  2.0  PHOS  --  3.3   GFR: Estimated Creatinine Clearance: 79.6 mL/min (by C-G formula based on SCr of 0.57 mg/dL). Recent Labs  Lab 09/24/24 2012  AST 23  ALT 35  ALKPHOS 92  BILITOT 0.2  PROT 7.6  ALBUMIN 4.2   No results for input(s): LIPASE, AMYLASE in the last 168 hours. No results for input(s): AMMONIA in the last 168 hours. Coagulation Profile:  Recent Labs  Lab 09/24/24 2012  INR 1.1   Unresulted Labs (From admission, onward)     Start     Ordered   09/26/24 0500  CBC  Daily,   R      09/25/24 0828   09/26/24 0500  Basic metabolic panel with GFR  Daily,   R      09/25/24 0828           Antimicrobials/Microbiology: Anti-infectives (From admission, onward)    Start     Dose/Rate Route Frequency Ordered Stop   09/25/24 1000  linezolid (ZYVOX) IVPB 600 mg        600 mg 300 mL/hr over 60 Minutes  Intravenous Every 12 hours 09/25/24 0357     09/25/24 0800  metroNIDAZOLE  (FLAGYL ) IVPB 500 mg        500 mg 100 mL/hr over 60 Minutes Intravenous Every 12 hours 09/25/24 0357     09/25/24 0400  ceFEPIme (MAXIPIME) 2 g in sodium chloride  0.9 % 100 mL IVPB        2 g 200 mL/hr over 30 Minutes Intravenous Every 8 hours 09/25/24 0357     09/24/24 2000  ceFEPIme (MAXIPIME) 2 g in sodium chloride  0.9 % 100 mL IVPB        2 g 200 mL/hr over 30 Minutes Intravenous  Once 09/24/24 1953 09/24/24 2108   09/24/24 2000  metroNIDAZOLE  (FLAGYL ) IVPB 500 mg        500 mg 100 mL/hr over 60 Minutes Intravenous  Once 09/24/24 1953 09/24/24 2205   09/24/24  2000  vancomycin (VANCOCIN) IVPB 1000 mg/200 mL premix        1,000 mg 200 mL/hr over 60 Minutes Intravenous  Once 09/24/24 1953 09/24/24 2324         Component Value Date/Time   SDES  09/24/2024 2012    BLOOD RIGHT ANTECUBITAL Performed at [facility], [address], [place], [place] [address]    SDES  09/24/2024 2012    BLOOD RIGHT ANTECUBITAL Performed at [facility], [address] [place], [place] [address]    SPECREQUEST  09/24/2024 2012    BOTTLES DRAWN AEROBIC AND ANAEROBIC Blood Culture results may not be optimal due to an inadequate volume of blood received in culture bottles Performed at [facility], [address] [place], [place] [address]    SPECREQUEST  09/24/2024 2012    BOTTLES DRAWN AEROBIC AND ANAEROBIC Blood Culture results may not be optimal due to an inadequate volume of blood received in culture bottles Performed at [facility], [address], [place], [place] [address]    CULT  09/24/2024 2012    NO GROWTH < 12 HOURS Performed at [facility], [address], [place], [place] [address]    CULT  09/24/2024 2012    NO GROWTH < 12 HOURS Performed at [facility], [address] [place], [place] [address]    REPTSTATUS  PENDING 09/24/2024 2012   REPTSTATUS PENDING 09/24/2024 2012  Procedures:  Mennie LAMY, MD [facility] 09/25/2024, 11:46 AM

## 2024-09-25 NOTE — Consult Note (Signed)
 Reason for Consult:fever Referring Physician: [name] is an 48 y.o. female.  HPI: The patient is a 48 year old female who is about 1 month status post left breast lumpectomy for breast cancer..  She had a drain left In the lumpectomy cavity.  She initially did well but recently developed pain and swelling of the left breast.  She had some fever at home.  She came to the emergency department where a CT scan of the chest was performed that showed a drain in good position.  She may have some hematoma in the breast related to the recent surgery  Past Medical History:  Diagnosis Date   Arthritis    Breast cancer (HCC) 03/2024   left   Depression    situational   Headache    Neuropathy 07/20/2024   in fingers and toes    Past Surgical History:  Procedure Laterality Date   AXILLARY LYMPH NODE DISSECTION Left 09/14/2024   Procedure: LYMPHADENECTOMY, AXILLARY;  Surgeon: Aron Shoulders, MD;  Location: [facility];  Service: General;  Laterality: Left;  LEFT AXILLARY LYMPH NODE DISSECTION GEN w/PEC BLOCK   AXILLARY SENTINEL NODE BIOPSY Left 08/23/2024   Procedure: LEFT SENTINEL NODE BIOPSY AXILLARY;  Surgeon: Aron Shoulders, MD;  Location: [facility];  Service: General;  Laterality: Left;  LEFT SENTINEL NODE BIOPSY   BREAST BIOPSY Left 03/2024   CESAREAN SECTION N/A 04/06/2017   Procedure: CESAREAN SECTION;  Surgeon: Herchel Gloris LABOR, MD;  [facility]: [facility];  Service: Obstetrics;  Laterality: N/A;   CHOLECYSTECTOMY N/A 04/26/2019   Procedure: LAPAROSCOPIC CHOLECYSTECTOMY WITH POSSIBLE INTRAOPERATIVE CHOLANGIOGRAM;  Surgeon: Vernetta Berg, MD;  Location: [facility] OR;  Service: General;  Laterality: N/A;   CYST EXCISION Right 2024   chest   PORT-A-CATH REMOVAL N/A 08/23/2024   Procedure: REMOVAL PORT-A-CATH;  Surgeon: Aron Shoulders, MD;  Location: [facility];  Service: General;  Laterality: N/A;   PORTACATH PLACEMENT N/A 04/04/2024   Procedure: INSERTION, TUNNELED CENTRAL VENOUS DEVICE,  WITH PORT;  Surgeon: Aron Shoulders, MD;  Location: [facility];  Service: General;  Laterality: N/A;    Family History  Problem Relation Age of Onset   Diabetes Father    Hypertension Father    Breast cancer Neg Hx     Social History:  reports that she has been smoking cigarettes. She has never used smokeless tobacco. She reports that she does not drink alcohol and does not use drugs.  Allergies:  Allergies  Allergen Reactions   Porcine (Pork) Protein-Containing Drug Products     Pt is Muslim.    Medications: I have reviewed the patient's current medications.  Results for orders placed or performed during the hospital encounter of 09/24/24 (from the past 48 hours)  Comprehensive metabolic panel     Status: Abnormal   Collection Time: 09/24/24  8:12 PM  Result Value Ref Range   Sodium 136 135 - 145 mmol/L   Potassium 4.2 3.5 - 5.1 mmol/L   Chloride 101 98 - 111 mmol/L   CO2 24 22 - 32 mmol/L   Glucose, Bld 139 (H) 70 - 99 mg/dL    Comment: Glucose reference range applies only to samples taken after fasting for at least 8 hours.   BUN 13 6 - 20 mg/dL   Creatinine, Ser 9.26 0.44 - 1.00 mg/dL   Calcium 9.4 8.9 - 89.6 mg/dL   Total Protein 7.6 6.5 - 8.1 g/dL   Albumin 4.2 3.5 - 5.0 g/dL   AST 23 15 -  41 U/L   ALT 35 0 - 44 U/L   Alkaline Phosphatase 92 38 - 126 U/L   Total Bilirubin 0.2 0.0 - 1.2 mg/dL   GFR, Estimated >39 >39 mL/min    Comment: (NOTE) Calculated using the CKD-EPI Creatinine Equation (2021)    Anion gap 12 5 - 15    Comment: Performed at [facility], [address], [place], [place] [address]  CBC with Differential     Status: Abnormal   Collection Time: 09/24/24  8:12 PM  Result Value Ref Range   WBC 15.0 (H) 4.0 - 10.5 K/uL   RBC 5.28 (H) 3.87 - 5.11 MIL/uL   Hemoglobin 13.0 12.0 - 15.0 g/dL   HCT 58.2 63.9 - 53.9 %   MCV 79.0 (L) 80.0 - 100.0 fL   MCH 24.6 (L) 26.0 - 34.0 pg   MCHC 31.2 30.0 - 36.0 g/dL   RDW 87.3 88.4 - 84.4 %    Platelets 382 150 - 400 K/uL   nRBC 0.0 0.0 - 0.2 %   Neutrophils Relative % 80 %   Neutro Abs 11.9 (H) 1.7 - 7.7 K/uL   Lymphocytes Relative 13 %   Lymphs Abs 1.9 0.7 - 4.0 K/uL   Monocytes Relative 7 %   Monocytes Absolute 1.1 (H) 0.1 - 1.0 K/uL   Eosinophils Relative 0 %   Eosinophils Absolute 0.0 0.0 - 0.5 K/uL   Basophils Relative 0 %   Basophils Absolute 0.0 0.0 - 0.1 K/uL   Immature Granulocytes 0 %   Abs Immature Granulocytes 0.06 0.00 - 0.07 K/uL    Comment: Performed at [facility], [address] [place], [place] [address]  Protime-INR     Status: None   Collection Time: 09/24/24  8:12 PM  Result Value Ref Range   Prothrombin Time 15.0 11.4 - 15.2 seconds   INR 1.1 0.8 - 1.2    Comment: (NOTE) INR goal varies based on device and disease states. Performed at [facility], [address] [place], [place] [address]   Blood Culture (routine x 2)     Status: None (Preliminary result)   Collection Time: 09/24/24  8:12 PM   Specimen: BLOOD  Result Value Ref Range   Specimen Description      BLOOD RIGHT ANTECUBITAL Performed at [facility], [address] [place], [place] [address]    Special Requests      BOTTLES DRAWN AEROBIC AND ANAEROBIC Blood Culture results may not be optimal due to an inadequate volume of blood received in culture bottles Performed at [facility], [address], [place], [place] [address]    Culture      NO GROWTH < 12 HOURS Performed at [facility], [address], [place], [place] [address]    Report Status PENDING   Blood Culture (routine x 2)     Status: None (Preliminary result)   Collection Time: 09/24/24  8:12 PM   Specimen: BLOOD  Result Value Ref Range   Specimen Description      BLOOD RIGHT ANTECUBITAL Performed at [facility], [address] [place], [place] [address]    Special Requests      BOTTLES DRAWN AEROBIC AND  ANAEROBIC Blood Culture results may not be optimal due to an inadequate volume of blood received in culture bottles Performed at [facility], [address] [place], [place] [address]    Culture  NO GROWTH < 12 HOURS Performed at [facility], [address], [place], [place] [address]    Report Status PENDING   hCG, serum, qualitative     Status: None   Collection Time: 09/24/24  8:12 PM  Result Value Ref Range   Preg, Serum NEGATIVE NEGATIVE    Comment:        THE SENSITIVITY OF THIS METHODOLOGY IS >10 mIU/mL. Performed at [facility], [address], [place], [place] [address]   I-Stat Lactic Acid, ED     Status: Abnormal   Collection Time: 09/24/24  8:21 PM  Result Value Ref Range   Lactic Acid, Venous 2.0 (HH) 0.5 - 1.9 mmol/L  Resp panel by RT-PCR (RSV, Flu A&B, Covid) Anterior Nasal Swab     Status: None   Collection Time: 09/24/24  8:44 PM   Specimen: Anterior Nasal Swab  Result Value Ref Range   SARS Coronavirus 2 by RT PCR NEGATIVE NEGATIVE    Comment: (NOTE) SARS-CoV-2 target nucleic acids are NOT DETECTED.  The SARS-CoV-2 RNA is generally detectable in upper respiratory specimens during the acute phase of infection. The lowest concentration of SARS-CoV-2 viral copies this assay can detect is 138 copies/mL. A negative result does not preclude SARS-Cov-2 infection and should not be used as the sole basis for treatment or other patient management decisions. A negative result may occur with  improper specimen collection/handling, submission of specimen other than nasopharyngeal swab, presence of viral mutation(s) within the areas targeted by this assay, and inadequate number of viral copies(<138 copies/mL). A negative result must be combined with clinical observations, patient history, and epidemiological information. The expected result is Negative.  Fact Sheet for Patients:   bloggercourse.com  Fact Sheet for Healthcare Providers:  seriousbroker.it  This test is no t yet approved or cleared by the [place]  FDA and  has been authorized for detection and/or diagnosis of SARS-CoV-2 by FDA under an Emergency Use Authorization (EUA). This EUA will remain  in effect (meaning this test can be used) for the duration of the COVID-19 declaration under Section 564(b)(1) of the Act, 21 U.S.C.section 360bbb-3(b)(1), unless the authorization is terminated  or revoked sooner.       Influenza A by PCR NEGATIVE NEGATIVE   Influenza B by PCR NEGATIVE NEGATIVE    Comment: (NOTE) The Xpert Xpress SARS-CoV-2/FLU/RSV plus assay is intended as an aid in the diagnosis of influenza from Nasopharyngeal swab specimens and should not be used as a sole basis for treatment. Nasal washings and aspirates are unacceptable for Xpert Xpress SARS-CoV-2/FLU/RSV testing.  Fact Sheet for Patients: bloggercourse.com  Fact Sheet for Healthcare Providers: seriousbroker.it  This test is not yet approved or cleared by the [place]  FDA and has been authorized for detection and/or diagnosis of SARS-CoV-2 by FDA under an Emergency Use Authorization (EUA). This EUA will remain in effect (meaning this test can be used) for the duration of the COVID-19 declaration under Section 564(b)(1) of the Act, 21 U.S.C. section 360bbb-3(b)(1), unless the authorization is terminated or revoked.     Resp Syncytial Virus by PCR NEGATIVE NEGATIVE    Comment: (NOTE) Fact Sheet for Patients: bloggercourse.com  Fact Sheet for Healthcare Providers: seriousbroker.it  This test is not yet approved or cleared by the [place]  FDA and has been authorized for detection and/or diagnosis of SARS-CoV-2 by FDA under an Emergency Use Authorization (EUA).  This EUA will remain in effect (meaning this test can be used) for  the duration of the COVID-19 declaration under Section 564(b)(1) of the Act, 21 U.S.C. section 360bbb-3(b)(1), unless the authorization is terminated or revoked.  Performed at [facility], [address], [place], [place] [address]   Urinalysis, w/ Reflex to Culture (Infection Suspected) -Urine, Clean Catch     Status: Abnormal   Collection Time: 09/24/24  8:44 PM  Result Value Ref Range   Specimen Source URINE, CLEAN CATCH    Color, Urine STRAW (A) YELLOW   APPearance CLEAR CLEAR   Specific Gravity, Urine 1.004 (L) 1.005 - 1.030   pH 5.0 5.0 - 8.0   Glucose, UA NEGATIVE NEGATIVE mg/dL   Hgb urine dipstick SMALL (A) NEGATIVE   Bilirubin Urine NEGATIVE NEGATIVE   Ketones, ur NEGATIVE NEGATIVE mg/dL   Protein, ur NEGATIVE NEGATIVE mg/dL   Nitrite NEGATIVE NEGATIVE   Leukocytes,Ua TRACE (A) NEGATIVE   RBC / HPF 0-5 0 - 5 RBC/hpf   WBC, UA 0-5 0 - 5 WBC/hpf    Comment:        Reflex urine culture not performed if WBC <=10, OR if Squamous epithelial cells >5. If Squamous epithelial cells >5 suggest recollection.    Bacteria, UA RARE (A) NONE SEEN   Squamous Epithelial / HPF 0-5 0 - 5 /HPF    Comment: Performed at [facility], [address], [place], [place] [address]  I-Stat Lactic Acid, ED     Status: None   Collection Time: 09/24/24 10:12 PM  Result Value Ref Range   Lactic Acid, Venous 1.3 0.5 - 1.9 mmol/L  CBC with Differential/Platelet     Status: Abnormal   Collection Time: 09/25/24  8:19 AM  Result Value Ref Range   WBC 14.9 (H) 4.0 - 10.5 K/uL   RBC 4.69 3.87 - 5.11 MIL/uL   Hemoglobin 11.6 (L) 12.0 - 15.0 g/dL   HCT 62.1 63.9 - 53.9 %   MCV 80.6 80.0 - 100.0 fL   MCH 24.7 (L) 26.0 - 34.0 pg   MCHC 30.7 30.0 - 36.0 g/dL   RDW 87.1 88.4 - 84.4 %   Platelets 300 150 - 400 K/uL   nRBC 0.0 0.0 - 0.2 %   Neutrophils Relative % 79 %   Neutro Abs 11.9 (H) 1.7 -  7.7 K/uL   Lymphocytes Relative 12 %   Lymphs Abs 1.7 0.7 - 4.0 K/uL   Monocytes Relative 7 %   Monocytes Absolute 1.0 0.1 - 1.0 K/uL   Eosinophils Relative 1 %   Eosinophils Absolute 0.2 0.0 - 0.5 K/uL   Basophils Relative 0 %   Basophils Absolute 0.0 0.0 - 0.1 K/uL   Immature Granulocytes 1 %   Abs Immature Granulocytes 0.09 (H) 0.00 - 0.07 K/uL    Comment: Performed at [facility], [address], [place], [place] [address]  Basic metabolic panel     Status: Abnormal   Collection Time: 09/25/24  8:19 AM  Result Value Ref Range   Sodium 138 135 - 145 mmol/L   Potassium 4.0 3.5 - 5.1 mmol/L   Chloride 105 98 - 111 mmol/L   CO2 26 22 - 32 mmol/L   Glucose, Bld 93 70 - 99 mg/dL    Comment: Glucose reference range applies only to samples taken after fasting for at least 8 hours.   BUN 7 6 - 20 mg/dL   Creatinine, Ser 9.42 0.44 - 1.00 mg/dL   Calcium 8.3 (L) 8.9 - 10.3 mg/dL  GFR, Estimated >60 >60 mL/min    Comment: (NOTE) Calculated using the CKD-EPI Creatinine Equation (2021)    Anion gap 8 5 - 15    Comment: Performed at [facility], [address], [place], [place] [address]  Magnesium      Status: None   Collection Time: 09/25/24  8:19 AM  Result Value Ref Range   Magnesium  2.0 1.7 - 2.4 mg/dL    Comment: Performed at [facility], [address], [place], [place] [address]  Phosphorus     Status: None   Collection Time: 09/25/24  8:19 AM  Result Value Ref Range   Phosphorus 3.3 2.5 - 4.6 mg/dL    Comment: Performed at [facility], [address] [place], [place] [address]    CT Chest W Contrast Result Date: 09/24/2024 EXAM: CT CHEST WITH CONTRAST 09/24/2024 09:35:42 PM TECHNIQUE: CT of the chest was performed with the administration of 70 mL of iohexol  (OMNIPAQUE ) 300 MG/ML solution. Multiplanar reformatted images are provided for review. Automated exposure control, iterative reconstruction,  and/or weight based adjustment of the mA/kV was utilized to reduce the radiation dose to as low as reasonably achievable. COMPARISON: MRI of the breast from 07/21/2024 CLINICAL HISTORY: Chest wall mass, US /xray nondiagnostic. FINDINGS: MEDIASTINUM: Thoracic aorta appears within normal limits. No cardiac enlargement is noted. Pulmonary artery appears within normal limits. The esophagus is within normal limits. LYMPH NODES: No mediastinal, hilar or axillary lymphadenopathy. LUNGS AND PLEURA: The lungs are well aerated bilaterally. No focal infiltrate. SOFT TISSUES/BONES: Bony structures are within normal limits. Postsurgical changes are noted in the left axilla consistent with the given clinical history. Generalized edema is noted in the operative site. A surgical drain is seen extending along the left chest wall. There is a soft tissue density which measures approximately 7.0 x 3.8 cm posterior laterally within the left breast likely representing a hematoma given the recent surgical intervention. This is removed from the drainage catheter. UPPER ABDOMEN: Visualized upper abdomen shows changes of prior cholecystectomy. IMPRESSION: 1. Postsurgical changes in the left axilla with generalized edema and a surgical drain extending along the left chest wall. 2. Soft tissue density measuring approximately 7.0 x 3.8 cm posterolaterally within the left breast, likely representing a hematoma given recent surgery and separate from the drainage catheter. Electronically signed by: Oneil Devonshire MD 09/24/2024 09:52 PM EST RP Workstation: [id number]    Review of Systems  Constitutional:  Positive for fever.  HENT: Negative.    Eyes: Negative.   Respiratory: Negative.    Cardiovascular: Negative.   Gastrointestinal: Negative.   Endocrine: Negative.   Genitourinary: Negative.   Musculoskeletal: Negative.   Skin:  Positive for color change.  Allergic/Immunologic: Negative.   Neurological: Negative.   Hematological:  Negative.   Psychiatric/Behavioral: Negative.     Blood pressure 109/73, pulse 99, temperature 99.6 F (37.6 C), temperature source Oral, resp. rate 17, height 5' (1.524 m), weight 79.9 kg, last menstrual period 04/24/2024, SpO2 97%. Physical Exam Vitals reviewed.  Constitutional:      General: She is not in acute distress.    Appearance: Normal appearance.  HENT:     Head: Normocephalic and atraumatic.     Right Ear: External ear normal.     Left Ear: External ear normal.     Nose: Nose normal.     Mouth/Throat:     Mouth: Mucous membranes are moist.     Pharynx: Oropharynx is clear.  Eyes:  Extraocular Movements: Extraocular movements intact.     Conjunctiva/sclera: Conjunctivae normal.     Pupils: Pupils are equal, round, and reactive to light.  Cardiovascular:     Rate and Rhythm: Normal rate and regular rhythm.     Pulses: Normal pulses.     Heart sounds: Normal heart sounds.  Pulmonary:     Effort: Pulmonary effort is normal.     Breath sounds: Normal breath sounds.  Abdominal:     General: Abdomen is flat. Bowel sounds are normal.     Palpations: Abdomen is soft.  Musculoskeletal:        General: No swelling or deformity. Normal range of motion.     Cervical back: Normal range of motion and neck supple.  Skin:    General: Skin is warm and dry.     Comments: There is cellulitis of the left breast.  Drain output is serosanguineous  Neurological:     General: No focal deficit present.     Mental Status: She is alert and oriented to person, place, and time.  Psychiatric:        Mood and Affect: Mood normal.        Behavior: Behavior normal.     Assessment/Plan: The patient appears to have developed some cellulitis of the left breast despite having a drain in place.  At this point I would recommend admission to the hospital on broad-spectrum antibiotic therapy.  We will follow the cellulitis closely with you and notify her primary surgeon tomorrow  Deward Null  III 09/25/2024, 10:30 AM

## 2024-09-25 NOTE — Progress Notes (Addendum)
 PT Cancellation Note  Patient Details Name: Wanda Garcia MRN: [id number] DOB: 1976/03/04   Cancelled Treatment:    Reason Eval/Treat Not Completed: PT screened, no needs identified, will sign off; pt declines PT, asking to rest today. pt has  been ambulating in room, to bathroom without assist. Will ask mobility team to see and if PT needs should  arise please re-consult Thank you   [facility] 09/25/2024, 12:37 PM

## 2024-09-26 DIAGNOSIS — R6520 Severe sepsis without septic shock: Secondary | ICD-10-CM | POA: Diagnosis not present

## 2024-09-26 DIAGNOSIS — A419 Sepsis, unspecified organism: Secondary | ICD-10-CM | POA: Diagnosis not present

## 2024-09-26 DIAGNOSIS — N179 Acute kidney failure, unspecified: Secondary | ICD-10-CM | POA: Diagnosis not present

## 2024-09-26 LAB — CBC
HCT: 34.3 % — ABNORMAL LOW (ref 36.0–46.0)
Hemoglobin: 10.8 g/dL — ABNORMAL LOW (ref 12.0–15.0)
MCH: 25.4 pg — ABNORMAL LOW (ref 26.0–34.0)
MCHC: 31.5 g/dL (ref 30.0–36.0)
MCV: 80.7 fL (ref 80.0–100.0)
Platelets: 312 K/uL (ref 150–400)
RBC: 4.25 MIL/uL (ref 3.87–5.11)
RDW: 12.7 % (ref 11.5–15.5)
WBC: 13.6 K/uL — ABNORMAL HIGH (ref 4.0–10.5)
nRBC: 0 % (ref 0.0–0.2)

## 2024-09-26 LAB — BLOOD CULTURE ID PANEL (REFLEXED) - BCID2

## 2024-09-26 LAB — BASIC METABOLIC PANEL WITH GFR
Anion gap: 9 (ref 5–15)
BUN: 6 mg/dL (ref 6–20)
CO2: 26 mmol/L (ref 22–32)
Calcium: 8.9 mg/dL (ref 8.9–10.3)
Chloride: 101 mmol/L (ref 98–111)
Creatinine, Ser: 0.59 mg/dL (ref 0.44–1.00)
GFR, Estimated: >60 mL/min (ref >60)
Glucose, Bld: 116 mg/dL — ABNORMAL HIGH (ref 70–99)
Potassium: 3.6 mmol/L (ref 3.5–5.1)
Sodium: 135 mmol/L (ref 135–145)

## 2024-09-26 NOTE — Progress Notes (Signed)
 This nurse explained at length to patient and family about patient's current infection: patient was very frustrated about the fact that she may have to be in the hospital for longer than a couple of days. Patient is more understanding of the plan of action (abx treatment designated by her hospitalist, possible surgical interventions if axillary wound needs further assessment, nutritional therapy if patient is unable to eat). Patient and family requests all providers/physicians to utilize the interpreter and to speak in terms that she understands.

## 2024-09-26 NOTE — Progress Notes (Signed)
 Patient given morphine  PRN for 10/10 pain. Patient complained of chest pain/palpitations despite this nurse administering medication very slowly. Patient stated, this medicine makes me feel as if my heart were about to stop. Patient informs this nurse this reaction happens every time she receives morphine  (informed this nurse AFTER administration of morphine ). This nurse d/c PRN morphine  due to this reaction and placed in patient allergy list for continuity of care.

## 2024-09-26 NOTE — Progress Notes (Signed)
 PHARMACY - PHYSICIAN COMMUNICATION CRITICAL VALUE ALERT - BLOOD CULTURE IDENTIFICATION (BCID)  Wanda Garcia is an 48 y.o. female who presented to [facility] on 09/24/2024 with a chief complaint of nausea.  Assessment:   Pt with recently diagnosed breast CA s/p L axillary node dissection with drain placement on 10/29.  Admit with sepsis likely due to cellulitis/infection at site of port, drain.   WBC elevated, Tm 103.2 F Aerobic bottle of 1 set growing GPC; BCID + Staph epi, no resistance  Name of physician (or Provider) Contacted: JINNY Kipper, NP  Current antibiotics: Cefepime + Flagyl  + Zyvox  Changes to prescribed antibiotics recommended:  Continue broad spectrum abx for now since pt still febrile, cx possibly contaminant.   F/U for blood cx updates  Results for orders placed or performed during the hospital encounter of 09/24/24  Blood Culture ID Panel (Reflexed) (Collected: 09/24/2024  8:12 PM)  Result Value Ref Range   Enterococcus faecalis NOT DETECTED NOT DETECTED   Enterococcus Faecium NOT DETECTED NOT DETECTED   Listeria monocytogenes NOT DETECTED NOT DETECTED   Staphylococcus species DETECTED (A) NOT DETECTED   Staphylococcus aureus (BCID) NOT DETECTED NOT DETECTED   Staphylococcus epidermidis DETECTED (A) NOT DETECTED   Staphylococcus lugdunensis NOT DETECTED NOT DETECTED   Streptococcus species NOT DETECTED NOT DETECTED   Streptococcus agalactiae NOT DETECTED NOT DETECTED   Streptococcus pneumoniae NOT DETECTED NOT DETECTED   Streptococcus pyogenes NOT DETECTED NOT DETECTED   A.calcoaceticus-baumannii NOT DETECTED NOT DETECTED   Bacteroides fragilis NOT DETECTED NOT DETECTED   Enterobacterales NOT DETECTED NOT DETECTED   Enterobacter cloacae complex NOT DETECTED NOT DETECTED   Escherichia coli NOT DETECTED NOT DETECTED   Klebsiella aerogenes NOT DETECTED NOT DETECTED   Klebsiella oxytoca NOT DETECTED NOT DETECTED   Klebsiella pneumoniae NOT DETECTED NOT DETECTED    Proteus species NOT DETECTED NOT DETECTED   Salmonella species NOT DETECTED NOT DETECTED   Serratia marcescens NOT DETECTED NOT DETECTED   Haemophilus influenzae NOT DETECTED NOT DETECTED   Neisseria meningitidis NOT DETECTED NOT DETECTED   Pseudomonas aeruginosa NOT DETECTED NOT DETECTED   Stenotrophomonas maltophilia NOT DETECTED NOT DETECTED   Candida albicans NOT DETECTED NOT DETECTED   Candida auris NOT DETECTED NOT DETECTED   Candida glabrata NOT DETECTED NOT DETECTED   Candida krusei NOT DETECTED NOT DETECTED   Candida parapsilosis NOT DETECTED NOT DETECTED   Candida tropicalis NOT DETECTED NOT DETECTED   Cryptococcus neoformans/gattii NOT DETECTED NOT DETECTED   Methicillin resistance mecA/C NOT DETECTED NOT DETECTED    Rosaline Millet PharmD 09/26/2024  12:44 AM

## 2024-09-26 NOTE — Progress Notes (Signed)
 PROGRESS NOTE Jax Abdelrahman  FMW:[id number] DOB: [birth year] DOA: 09/24/2024 PCP: [facility]  Brief Narrative/Hospital Course: Esmae Donathan is a 48 y.o. female with PMH of  for recently diagnosed left breast cancer 03/22/2024 status post chemotherapy (last chemotherapy was about 2 months ago-was stopped prematurely due to chemo induced neuropathy), anxiety, status post left axillary lymphadenopathy on 09/14/2024 with drain, status post port site removal, who presents to the ER sent from urgent care with concern for possible port site infection, generalized weakness and fatigue, nausea, and vomiting.  Also endorses subjective fevers 101.2 at home and associated chills.   In the ZM:Uzfezmjulmz 101.5. BP 114/56, pulse 127, respiration rate 13, O2 saturation 98% on room air. Labs-lactic acid 2.0, leukocytosis 15,000.  Lateral left breast appear erythematous, tender, warm, with purulent drainage from drain site CT chest with contrast : postsurgical changes in the left axilla with generalized edema and surgical drain extending along the left chest wall.  Soft tissue density measuring approximately 7.0 x 3.8 cm posterior laterally within the left breast, likely representing a hematoma given recent surgery and separate from the drainage catheter. Peripheral blood cultures x 2 were obtained.  The patient was started on broad-spectrum IV antibiotics for sepsis, IV vancomycin, cefepime, and IV Flagyl  and admitted.   Subjective: Seen and examined today C/o ongoign pain on left breast febrile up to 101.4 in evening Vitals otherwise stable Labs reviewed WBC further down 13.6 creat ok Used Video Arabic interpretor-she has a lot of questions -tried to answer all of them, awaiting  to see Dr Aron today  Assessment and plan:  Sepsis POA suspected drain site, port site infection, POA Cellulitis of the left breast-despite having drain Positive blood culture BCID Staph epidermidis in 1  set: Recent left axillary lymphadenopathy on 09/14/2024 with drain, s/p recent port site removal. Having intermittent fever-bacteremia with Staph epidermidis?  Contamination. Surgery following- Dr Dorene to see her today- still having pain Continue antibiotics for cellulitis w/ cefepime,Zyvox and Flagyl . 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 F/b Dr. Odean Muff I Obesity w/ Body mass index is 34.4 kg/m.: Will benefit with PCP follow-up, weight loss,healthy lifestyle  Generalized weakness:  PT Orders: Active  PT Follow up Rec:    DVT prophylaxis: SCDs Start: 09/24/24 2245 Code Status:   Code Status: Full Code Family Communication: plan of care discussed with patient. Husband not at bedside today. Patient status is: Remains hospitalized because of severity of illness Level of care: Progressive   Dispo: The patient is from:Home            Anticipated disposition: TBD Objective: Vitals last 24 hrs: Vitals:   09/25/24 2018 09/25/24 2240 09/26/24 0521 09/26/24 0906  BP: 118/71  114/65 109/70  Pulse: 93  (!) 102 88  Resp: 17  16 17   Temp: 99.3 F (37.4 C) 99.9 F (37.7 C) 100.2 F (37.9 C) 98.8 F (37.1 C)  TempSrc: Oral Oral Oral Oral  SpO2: 100%  94% 97%  Weight:      Height:  Physical Examination: General exam: AAOX3 HEENT:Oral mucosa moist, Ear/Nose WNL grossly Respiratory system: Bilaterally clear BS,no use of accessory muscle Cardiovascular system: S1 & S2 +, No JVD. Gastrointestinal system: Abdomen soft,NT,ND, BS+ Nervous  System: Alert, awake, moving all extremities,and following commands. Extremities: extremities warm, leg edema neg Skin: Warm, no rashes, tender left axilla area with drain + tenderness +, dressing c/d/I . MSK: Normal muscle bulk,tone, power   Medications reviewed:  Scheduled Meds: Continuous Infusions:  ceFEPime (MAXIPIME) IV 2 g (09/26/24 0344)   lactated ringers  125 mL/hr at 09/26/24 0236   linezolid (ZYVOX) IV 600 mg (09/25/24 2253)   metronidazole  500 mg (09/26/24 0908)   Diet: Diet Order             Diet regular Room service appropriate? Yes; Fluid consistency: Thin  Diet effective now                   Data Reviewed: I have personally reviewed following labs and imaging studies ( see epic result tab) CBC: Recent Labs  Lab 09/24/24 2012 09/25/24 0819 09/26/24 0414  WBC 15.0* 14.9* 13.6*  NEUTROABS 11.9* 11.9*  --   HGB 13.0 11.6* 10.8*  HCT 41.7 37.8 34.3*  MCV 79.0* 80.6 80.7  PLT 382 300 312   CMP: Recent Labs  Lab 09/24/24 2012 09/25/24 0819 09/26/24 0414  NA 136 138 135  K 4.2 4.0 3.6  CL 101 105 101  CO2 24 26 26   GLUCOSE 139* 93 116*  BUN 13 7 6   CREATININE 0.73 0.57 0.59  CALCIUM 9.4 8.3* 8.9  MG  --  2.0  --   PHOS  --  3.3  --    GFR: Estimated Creatinine Clearance: 79.6 mL/min (by C-G formula based on SCr of 0.59 mg/dL). Recent Labs  Lab 09/24/24 2012  AST 23  ALT 35  ALKPHOS 92  BILITOT 0.2  PROT 7.6  ALBUMIN 4.2   No results for input(s): LIPASE, AMYLASE in the last 168 hours. No results for input(s): AMMONIA in the last 168 hours. Coagulation Profile:  Recent Labs  Lab 09/24/24 2012  INR 1.1   Unresulted Labs (From admission, onward)     Start     Ordered   09/26/24 0500  CBC  Daily,   R      09/25/24 0828   09/26/24 0500  Basic metabolic panel with GFR  Daily,   R      09/25/24 0828           Antimicrobials/Microbiology: Anti-infectives (From admission, onward)    Start     Dose/Rate Route Frequency  Ordered Stop   09/25/24 1000  linezolid (ZYVOX) IVPB 600 mg        600 mg 300 mL/hr over 60 Minutes Intravenous Every 12 hours 09/25/24 0357     09/25/24 0800  metroNIDAZOLE  (FLAGYL ) IVPB 500 mg        500 mg 100 mL/hr over 60 Minutes Intravenous Every 12 hours 09/25/24 0357     09/25/24 0400  ceFEPIme (MAXIPIME) 2 g in sodium chloride  0.9 % 100 mL IVPB        2 g 200 mL/hr over 30 Minutes Intravenous Every 8 hours 09/25/24 0357     09/24/24 2000  ceFEPIme (MAXIPIME) 2 g in sodium chloride  0.9 % 100 mL IVPB        2 g 200 mL/hr over 30 Minutes Intravenous  Once 09/24/24 1953 09/24/24 2108   09/24/24 2000  metroNIDAZOLE  (FLAGYL )  IVPB 500 mg        500 mg 100 mL/hr over 60 Minutes Intravenous  Once 09/24/24 1953 09/24/24 2205   09/24/24 2000  vancomycin (VANCOCIN) IVPB 1000 mg/200 mL premix        1,000 mg 200 mL/hr over 60 Minutes Intravenous  Once 09/24/24 1953 09/24/24 2324         Component Value Date/Time   SDES  09/24/2024 2012    BLOOD RIGHT ANTECUBITAL Performed at [facility], [address], [place], [place] [address]    SDES  09/24/2024 2012    BLOOD RIGHT ANTECUBITAL Performed at [facility], [address] [place], [place] [address]    SPECREQUEST  09/24/2024 2012    BOTTLES DRAWN AEROBIC AND ANAEROBIC Blood Culture results may not be optimal due to an inadequate volume of blood received in culture bottles Performed at [facility], [address], [place], [place] [address]    SPECREQUEST  09/24/2024 2012    BOTTLES DRAWN AEROBIC AND ANAEROBIC Blood Culture results may not be optimal due to an inadequate volume of blood received in culture bottles Performed at [facility], [address], [place], [place] [address]    BERNIS SETTLE POSITIVE COCCI 09/24/2024 2012   CULT  09/24/2024 2012    NO GROWTH 2 DAYS Performed at [facility], [address], [place], [place] [address]     REPTSTATUS PENDING 09/24/2024 2012   REPTSTATUS PENDING 09/24/2024 2012  Procedures:  Mennie LAMY, MD [facility] 09/26/2024, 10:50 AM

## 2024-09-26 NOTE — Progress Notes (Signed)
 I received a spiritual care consult to provide support to [facility].  She was in significant pain so our conversation was brief.  She is scared about what is happening and doesn't fully understand what is going on. She is worried about her son who is 7 and who has autism, both because she is away from him right now and because she doesn't know what this might mean for her future. I provided listening as well as emotional support.  We will continue to follow her for support, but please also page as needs arise.

## 2024-09-26 NOTE — Plan of Care (Signed)

## 2024-09-26 NOTE — Progress Notes (Signed)
 Subjective/Chief Complaint: Pt admitted.  Still having a lot of pain and requiring IV pain meds.     Objective: Vital signs in last 24 hours: Temp:  [98.4 F (36.9 C)-103.2 F (39.6 C)] 98.4 F (36.9 C) (11/10 1312) Pulse Rate:  [88-121] 96 (11/10 1312) Resp:  [15-20] 17 (11/10 1312) BP: (109-118)/(56-77) 112/77 (11/10 1312) SpO2:  [94 %-100 %] 99 % (11/10 1312) Last BM Date : 09/25/24  Intake/Output from previous day: 11/09 0701 - 11/10 0700 In: 3566.8 [P.O.:600; I.V.:1883.6; IV Piggyback:1083.2] Out: 2825 [Urine:2800; Drains:25] Intake/Output this shift: Total I/O In: 939 [I.V.:699.5; IV Piggyback:239.5] Out: 705 [Urine:700; Drains:5]  General:  looks uncomfortable Left axilla red with tenderness and swelling.  Drain serosang.   Lab Results:  Recent Labs    09/25/24 0819 09/26/24 0414  WBC 14.9* 13.6*  HGB 11.6* 10.8*  HCT 37.8 34.3*  PLT 300 312   BMET Recent Labs    09/25/24 0819 09/26/24 0414  NA 138 135  K 4.0 3.6  CL 105 101  CO2 26 26  GLUCOSE 93 116*  BUN 7 6  CREATININE 0.57 0.59  CALCIUM 8.3* 8.9   PT/INR Recent Labs    09/24/24 2012  LABPROT 15.0  INR 1.1   ABG No results for input(s): PHART, HCO3 in the last 72 hours.  Invalid input(s): PCO2, PO2  Studies/Results: CT Chest W Contrast Result Date: 09/24/2024 EXAM: CT CHEST WITH CONTRAST 09/24/2024 09:35:42 PM TECHNIQUE: CT of the chest was performed with the administration of 70 mL of iohexol  (OMNIPAQUE ) 300 MG/ML solution. Multiplanar reformatted images are provided for review. Automated exposure control, iterative reconstruction, and/or weight based adjustment of the mA/kV was utilized to reduce the radiation dose to as low as reasonably achievable. COMPARISON: MRI of the breast from 07/21/2024 CLINICAL HISTORY: Chest wall mass, US maximiano nondiagnostic. FINDINGS: MEDIASTINUM: Thoracic aorta appears within normal limits. No cardiac enlargement is noted. Pulmonary artery  appears within normal limits. The esophagus is within normal limits. LYMPH NODES: No mediastinal, hilar or axillary lymphadenopathy. LUNGS AND PLEURA: The lungs are well aerated bilaterally. No focal infiltrate. SOFT TISSUES/BONES: Bony structures are within normal limits. Postsurgical changes are noted in the left axilla consistent with the given clinical history. Generalized edema is noted in the operative site. A surgical drain is seen extending along the left chest wall. There is a soft tissue density which measures approximately 7.0 x 3.8 cm posterior laterally within the left breast likely representing a hematoma given the recent surgical intervention. This is removed from the drainage catheter. UPPER ABDOMEN: Visualized upper abdomen shows changes of prior cholecystectomy. IMPRESSION: 1. Postsurgical changes in the left axilla with generalized edema and a surgical drain extending along the left chest wall. 2. Soft tissue density measuring approximately 7.0 x 3.8 cm posterolaterally within the left breast, likely representing a hematoma given recent surgery and separate from the drainage catheter. Electronically signed by: Oneil Devonshire MD 09/24/2024 09:52 PM EST RP Workstation: [id number]    Anti-infectives: Anti-infectives (From admission, onward)    Start     Dose/Rate Route Frequency Ordered Stop   09/25/24 1000  linezolid (ZYVOX) IVPB 600 mg        600 mg 300 mL/hr over 60 Minutes Intravenous Every 12 hours 09/25/24 0357     09/25/24 0800  metroNIDAZOLE  (FLAGYL ) IVPB 500 mg        500 mg 100 mL/hr over 60 Minutes Intravenous Every 12 hours 09/25/24 0357     09/25/24 0400  ceFEPIme (  MAXIPIME) 2 g in sodium chloride  0.9 % 100 mL IVPB        2 g 200 mL/hr over 30 Minutes Intravenous Every 8 hours 09/25/24 0357     09/24/24 2000  ceFEPIme (MAXIPIME) 2 g in sodium chloride  0.9 % 100 mL IVPB        2 g 200 mL/hr over 30 Minutes Intravenous  Once 09/24/24 1953 09/24/24 2108   09/24/24 2000   metroNIDAZOLE  (FLAGYL ) IVPB 500 mg        500 mg 100 mL/hr over 60 Minutes Intravenous  Once 09/24/24 1953 09/24/24 2205   09/24/24 2000  vancomycin (VANCOCIN) IVPB 1000 mg/200 mL premix        1,000 mg 200 mL/hr over 60 Minutes Intravenous  Once 09/24/24 1953 09/24/24 2324       Assessment/Plan: Left breast cancer, s/p lumpectomy, targeted/sentinel node 08/23/2028, then completion ALND 09/14/2024. Cellulitis Hematoma.  Fever  Continue IV antibiotics Will remove drain as it is not communicating with the hematoma Will assess if cellulitis improves on antibiotics May need operative drainage.      LOS: 2 days    Jina LITTIE Nephew, MD, FACS, FSSO Surgical Oncology, General Surgery, Trauma and Critical [facility] Surgery, [place] [phone] for weekday/non holidays Check [email or web address] for coverage night/weekend/holidays under General Surgery

## 2024-09-27 ENCOUNTER — Encounter: Payer: Self-pay | Admitting: Hematology and Oncology

## 2024-09-27 DIAGNOSIS — R6520 Severe sepsis without septic shock: Secondary | ICD-10-CM | POA: Diagnosis not present

## 2024-09-27 DIAGNOSIS — N179 Acute kidney failure, unspecified: Secondary | ICD-10-CM | POA: Diagnosis not present

## 2024-09-27 DIAGNOSIS — A419 Sepsis, unspecified organism: Secondary | ICD-10-CM | POA: Diagnosis not present

## 2024-09-27 LAB — CBC
HCT: 34.5 % — ABNORMAL LOW (ref 36.0–46.0)
Hemoglobin: 11 g/dL — ABNORMAL LOW (ref 12.0–15.0)
MCH: 25.5 pg — ABNORMAL LOW (ref 26.0–34.0)
MCHC: 31.9 g/dL (ref 30.0–36.0)
MCV: 79.9 fL — ABNORMAL LOW (ref 80.0–100.0)
Platelets: 385 K/uL (ref 150–400)
RBC: 4.32 MIL/uL (ref 3.87–5.11)
RDW: 12.5 % (ref 11.5–15.5)
WBC: 10.9 K/uL — ABNORMAL HIGH (ref 4.0–10.5)
nRBC: 0 % (ref 0.0–0.2)

## 2024-09-27 LAB — BASIC METABOLIC PANEL WITH GFR
Anion gap: 10 (ref 5–15)
BUN: 6 mg/dL (ref 6–20)
CO2: 26 mmol/L (ref 22–32)
Calcium: 9 mg/dL (ref 8.9–10.3)
Chloride: 103 mmol/L (ref 98–111)
Creatinine, Ser: 0.54 mg/dL (ref 0.44–1.00)
GFR, Estimated: >60 mL/min (ref >60)
Glucose, Bld: 89 mg/dL (ref 70–99)
Potassium: 3.8 mmol/L (ref 3.5–5.1)
Sodium: 139 mmol/L (ref 135–145)

## 2024-09-27 LAB — CULTURE, BLOOD (ROUTINE X 2): Culture: NO GROWTH — AB

## 2024-09-27 MED ORDER — LACTATED RINGERS IV SOLN: INTRAVENOUS | Status: AC

## 2024-09-27 MED ORDER — ACETAMINOPHEN 325 MG PO TABS
650.0000 mg | ORAL_TABLET | Freq: Four times a day (QID) | ORAL | Status: DC | PRN
  Administered 2024-10-02: 650 mg via ORAL
  Filled 2024-09-27: qty 2

## 2024-09-27 MED ORDER — OXYCODONE HCL 5 MG PO TABS
5.0000 mg | ORAL_TABLET | Freq: Four times a day (QID) | ORAL | Status: DC | PRN
  Administered 2024-09-27 – 2024-10-01 (×16): 10 mg via ORAL
  Administered 2024-10-01: 5 mg via ORAL
  Administered 2024-10-02 – 2024-10-03 (×4): 10 mg via ORAL
  Administered 2024-10-03: 5 mg via ORAL
  Administered 2024-10-03 – 2024-10-04 (×3): 10 mg via ORAL
  Filled 2024-09-27 (×15): qty 2
  Filled 2024-09-27: qty 1
  Filled 2024-09-27 (×4): qty 2
  Filled 2024-09-27: qty 1
  Filled 2024-09-27 (×5): qty 2

## 2024-09-27 MED ORDER — ACETAMINOPHEN 325 MG PO TABS
650.0000 mg | ORAL_TABLET | Freq: Four times a day (QID) | ORAL | Status: AC | PRN
  Administered 2024-09-27 (×2): 650 mg via ORAL
  Filled 2024-09-27 (×2): qty 2

## 2024-09-27 NOTE — Progress Notes (Signed)
 Subjective/Chief Complaint: Drain removed yesterday.  Says pain may be slightly better, but still with a lot of pain today and now down her arm and into her chest.  Fever curve is somewhat improved   Objective: Vital signs in last 24 hours: Temp:  [98.4 F (36.9 C)-101.4 F (38.6 C)] 99.1 F (37.3 C) (11/11 0906) Pulse Rate:  [92-97] 92 (11/11 0823) Resp:  [16-21] 16 (11/11 0823) BP: (112-131)/(69-77) 120/76 (11/11 0823) SpO2:  [98 %-99 %] 98 % (11/11 0823) Last BM Date : 09/24/24  Intake/Output from previous day: 11/10 0701 - 11/11 0700 In: 2202 [P.O.:240; I.V.:762; IV Piggyback:1200] Out: 3215 [Urine:3200; Drains:15] Intake/Output this shift: Total I/O In: 120 [P.O.:120] Out: -   General:  NAD Left axilla erythema with tenderness and swelling.  Erythema now tracks down into her left breast. Ext: left radial pulse is palpable and strong, no significant edema in the lower part of her arm.  Compartments are soft  Lab Results:  Recent Labs    09/26/24 0414 09/27/24 0424  WBC 13.6* 10.9*  HGB 10.8* 11.0*  HCT 34.3* 34.5*  PLT 312 385   BMET Recent Labs    09/26/24 0414 09/27/24 0424  NA 135 139  K 3.6 3.8  CL 101 103  CO2 26 26  GLUCOSE 116* 89  BUN 6 6  CREATININE 0.59 0.54  CALCIUM 8.9 9.0   PT/INR Recent Labs    09/24/24 2012  LABPROT 15.0  INR 1.1   ABG No results for input(s): PHART, HCO3 in the last 72 hours.  Invalid input(s): PCO2, PO2  Studies/Results: No results found.   Anti-infectives: Anti-infectives (From admission, onward)    Start     Dose/Rate Route Frequency Ordered Stop   09/25/24 1000  linezolid (ZYVOX) IVPB 600 mg        600 mg 300 mL/hr over 60 Minutes Intravenous Every 12 hours 09/25/24 0357     09/25/24 0800  metroNIDAZOLE  (FLAGYL ) IVPB 500 mg        500 mg 100 mL/hr over 60 Minutes Intravenous Every 12 hours 09/25/24 0357     09/25/24 0400  ceFEPIme (MAXIPIME) 2 g in sodium chloride  0.9 % 100 mL IVPB         2 g 200 mL/hr over 30 Minutes Intravenous Every 8 hours 09/25/24 0357     09/24/24 2000  ceFEPIme (MAXIPIME) 2 g in sodium chloride  0.9 % 100 mL IVPB        2 g 200 mL/hr over 30 Minutes Intravenous  Once 09/24/24 1953 09/24/24 2108   09/24/24 2000  metroNIDAZOLE  (FLAGYL ) IVPB 500 mg        500 mg 100 mL/hr over 60 Minutes Intravenous  Once 09/24/24 1953 09/24/24 2205   09/24/24 2000  vancomycin (VANCOCIN) IVPB 1000 mg/200 mL premix        1,000 mg 200 mL/hr over 60 Minutes Intravenous  Once 09/24/24 1953 09/24/24 2324       Assessment/Plan: Left breast cancer, s/p lumpectomy, targeted/sentinel node 08/23/2028, then completion ALND 09/14/2024. Cellulitis Hematoma.  Fever  Continue IV antibiotics Unfortunately despite drain removal, this has not made a huge difference in her pain and her erythema has continued to progress Will need to return to the OR, time pending for today vs tomorrow NPO for now until timing determined.    LOS: 3 days    Burnard FORBES Banter, [facility], [place] [phone] for weekday/non holidays Check [email or web address] for coverage night/weekend/holidays under General  Surgery

## 2024-09-27 NOTE — Progress Notes (Signed)
 Patient's spouse called seeking additional financial assistance. Patient only has a small remaining balance of [employer]. Message sent to [facility] in social work for any additional resources.  He has my card for any additional financial questions or concerns.

## 2024-09-27 NOTE — Plan of Care (Signed)
  Problem: Clinical Measurements: Goal: Respiratory complications will improve Outcome: Progressing Goal: Cardiovascular complication will be avoided Outcome: Progressing   Problem: Coping: Goal: Level of anxiety will decrease Outcome: Progressing   Problem: Pain Managment: Goal: General experience of comfort will improve and/or be controlled Outcome: Progressing   Problem: Safety: Goal: Ability to remain free from injury will improve Outcome: Progressing   Problem: Skin Integrity: Goal: Risk for impaired skin integrity will decrease Outcome: Progressing

## 2024-09-27 NOTE — Progress Notes (Signed)
 Pt temp at 0533 100.0. Pt refused removal of blankets or ice to groin. Temp now 99.0.

## 2024-09-27 NOTE — H&P (View-Only) (Signed)
 Subjective/Chief Complaint: Drain removed yesterday.  Says pain may be slightly better, but still with a lot of pain today and now down her arm and into her chest.  Fever curve is somewhat improved   Objective: Vital signs in last 24 hours: Temp:  [98.4 F (36.9 C)-101.4 F (38.6 C)] 99.1 F (37.3 C) (11/11 0906) Pulse Rate:  [92-97] 92 (11/11 0823) Resp:  [16-21] 16 (11/11 0823) BP: (112-131)/(69-77) 120/76 (11/11 0823) SpO2:  [98 %-99 %] 98 % (11/11 0823) Last BM Date : 09/24/24  Intake/Output from previous day: 11/10 0701 - 11/11 0700 In: 2202 [P.O.:240; I.V.:762; IV Piggyback:1200] Out: 3215 [Urine:3200; Drains:15] Intake/Output this shift: Total I/O In: 120 [P.O.:120] Out: -   General:  NAD Left axilla erythema with tenderness and swelling.  Erythema now tracks down into her left breast. Ext: left radial pulse is palpable and strong, no significant edema in the lower part of her arm.  Compartments are soft  Lab Results:  Recent Labs    09/26/24 0414 09/27/24 0424  WBC 13.6* 10.9*  HGB 10.8* 11.0*  HCT 34.3* 34.5*  PLT 312 385   BMET Recent Labs    09/26/24 0414 09/27/24 0424  NA 135 139  K 3.6 3.8  CL 101 103  CO2 26 26  GLUCOSE 116* 89  BUN 6 6  CREATININE 0.59 0.54  CALCIUM 8.9 9.0   PT/INR Recent Labs    09/24/24 2012  LABPROT 15.0  INR 1.1   ABG No results for input(s): PHART, HCO3 in the last 72 hours.  Invalid input(s): PCO2, PO2  Studies/Results: No results found.   Anti-infectives: Anti-infectives (From admission, onward)    Start     Dose/Rate Route Frequency Ordered Stop   09/25/24 1000  linezolid (ZYVOX) IVPB 600 mg        600 mg 300 mL/hr over 60 Minutes Intravenous Every 12 hours 09/25/24 0357     09/25/24 0800  metroNIDAZOLE  (FLAGYL ) IVPB 500 mg        500 mg 100 mL/hr over 60 Minutes Intravenous Every 12 hours 09/25/24 0357     09/25/24 0400  ceFEPIme (MAXIPIME) 2 g in sodium chloride  0.9 % 100 mL IVPB         2 g 200 mL/hr over 30 Minutes Intravenous Every 8 hours 09/25/24 0357     09/24/24 2000  ceFEPIme (MAXIPIME) 2 g in sodium chloride  0.9 % 100 mL IVPB        2 g 200 mL/hr over 30 Minutes Intravenous  Once 09/24/24 1953 09/24/24 2108   09/24/24 2000  metroNIDAZOLE  (FLAGYL ) IVPB 500 mg        500 mg 100 mL/hr over 60 Minutes Intravenous  Once 09/24/24 1953 09/24/24 2205   09/24/24 2000  vancomycin (VANCOCIN) IVPB 1000 mg/200 mL premix        1,000 mg 200 mL/hr over 60 Minutes Intravenous  Once 09/24/24 1953 09/24/24 2324       Assessment/Plan: Left breast cancer, s/p lumpectomy, targeted/sentinel node 08/23/2028, then completion ALND 09/14/2024. Cellulitis Hematoma.  Fever  Continue IV antibiotics Unfortunately despite drain removal, this has not made a huge difference in her pain and her erythema has continued to progress Will need to return to the OR, time pending for today vs tomorrow NPO for now until timing determined.    LOS: 3 days    Burnard FORBES Banter, [facility] Surgery, [place] [phone] for weekday/non holidays Check [email or web address] for coverage night/weekend/holidays under General  Surgery

## 2024-09-27 NOTE — Progress Notes (Signed)
 Pt had temp 101.4. Removed extra blankets, decreased temp of room, applied ice packs to groin and administered PRN Tylenol . Provider made aware. Temp decreased to 99.1.

## 2024-09-27 NOTE — Progress Notes (Signed)
 PROGRESS NOTE Wanda Garcia  FMW:[id number] DOB: 10/15/1976 DOA: 09/24/2024 PCP: [facility]  Brief Narrative/Hospital Course: Wanda Garcia is a 48 y.o. female with PMH of  for recently diagnosed left breast cancer 03/22/2024 status post chemotherapy (last chemotherapy was about 2 months ago-was stopped prematurely due to chemo induced neuropathy), anxiety, status post left axillary lymphadenopathy on 09/14/2024 with drain, status post port site removal, who presents to the ER sent from urgent care with concern for possible port site infection, generalized weakness and fatigue, nausea, and vomiting.  Also endorses subjective fevers 101.2 at home and associated chills.   In the ZM:Uzfezmjulmz 101.5. BP 114/56, pulse 127, respiration rate 13, O2 saturation 98% on room air. Labs-lactic acid 2.0, leukocytosis 15,000.  Lateral left breast appear erythematous, tender, warm, with purulent drainage from drain site CT chest with contrast : postsurgical changes in the left axilla with generalized edema and surgical drain extending along the left chest wall.  Soft tissue density measuring approximately 7.0 x 3.8 cm posterior laterally within the left breast, likely representing a hematoma given recent surgery and separate from the drainage catheter. Peripheral blood cultures x 2 were obtained.  The patient was started on broad-spectrum IV antibiotics for sepsis, IV vancomycin, cefepime, and IV Flagyl  and admitted.   Subjective: Seen and examined today Complains of ongoing pain with erythema tenderness on the left breast Tmax on 101.4 yesterday 9 PM-was on blankets, vitals stable Labs reviewed WBC almost normalized, stable electrolytes and renal function Somewhat tearful again but less than yesterday.  Assessment and plan:  Sepsis POA suspected drain site, port site infection, POA Left breast cancer s/p lumpectomy, targeted/sentinel node 10/7 and then completion ALND 10/29 Cellulitis of the  left breast Hematoma on the left breast Recurrent fever Staph epidermidis in 1 set-likely contamination: Recent left axillary lymphadenopathy on 09/14/2024 with drain, s/p recent port site removal. Having intermittent fever-bacteremia with Staph epidermidis?  Contamination. Dr Dorene following, drain removed 11/10 as drain is not communicating w/ hematoma may need operative drainage.  Continue antibiotics for cellulitis w/ cefepime,Zyvox and Flagyl . Nursing staff concerned about her pain not well-controlled on 5 MG of oxy, added 5-10 based on pain scale Recent Labs  Lab 09/24/24 2012 09/24/24 2021 09/24/24 2212 09/25/24 0819 09/26/24 0414 09/27/24 0424  WBC 15.0*  --   --  14.9* 13.6* 10.9*  LATICACIDVEN  --  2.0* 1.3  --   --   --     Suspected red man syndrome after IV vancomycin infusion IV vancomycin has been discontinued,     Tobacco use disorder Cessation counseling    Left breast cancer status postchemotherapy Chemo-induced polyneuropathy Chemotherapy was interrupted, about 2 months ago, due to chemo induced polyneuropathy.F/b Dr. Odean Muff I Obesity w/ Body mass index is 34.4 kg/m.: Will benefit with PCP follow-up, weight loss,healthy lifestyle  Generalized weakness:  PT Orders: Active PT Follow up Rec:    DVT prophylaxis: SCDs Start: 09/24/24 2245 refusing pork derivatives including Lovenox and heparin . Code Status:   Code Status: Full Code Family Communication: plan of care discussed with patient. Husband not at bedside today. Patient status is: Remains hospitalized because of severity of illness Level of care: Progressive   Dispo: The patient is from:Home            Anticipated disposition: TBD Objective: Vitals last 24 hrs: Vitals:   09/27/24 0533 09/27/24 0700 09/27/24 0823 09/27/24 0906  BP: 131/72  120/76   Pulse: 92  92  Resp: 17  16   Temp: 100 F (37.8 C) 99.1 F (37.3 C) 99.6 F (37.6 C) 99.1 F (37.3 C)  TempSrc: Oral Oral Oral  Oral  SpO2: 98%  98%   Weight:      Height:        Physical Examination: General exam: AAOX3 HEENT:Oral mucosa moist, Ear/Nose WNL grossly Respiratory system: Bilaterally clear BS,no use of accessory muscle Cardiovascular system: S1 & S2 +, No JVD. Gastrointestinal system: Abdomen soft,NT,ND, BS+ Nervous System: Alert, awake, moving all extremities,and following commands. Extremities: extremities warm, leg edema neg Skin: Warm, no rashes, tender left axilla area erythema tenderness swelling dressing+ MSK: Normal muscle bulk,tone, power   Medications reviewed:  Scheduled Meds: Continuous Infusions:  ceFEPime (MAXIPIME) IV 2 g (09/27/24 0431)   linezolid (ZYVOX) IV 600 mg (09/26/24 2335)   metronidazole  500 mg (09/27/24 0941)   Diet: Diet Order             Diet regular Room service appropriate? Yes; Fluid consistency: Thin  Diet effective now                   Data Reviewed: I have personally reviewed following labs and imaging studies ( see epic result tab) CBC: Recent Labs  Lab 09/24/24 2012 09/25/24 0819 09/26/24 0414 09/27/24 0424  WBC 15.0* 14.9* 13.6* 10.9*  NEUTROABS 11.9* 11.9*  --   --   HGB 13.0 11.6* 10.8* 11.0*  HCT 41.7 37.8 34.3* 34.5*  MCV 79.0* 80.6 80.7 79.9*  PLT 382 300 312 385   CMP: Recent Labs  Lab 09/24/24 2012 09/25/24 0819 09/26/24 0414 09/27/24 0424  NA 136 138 135 139  K 4.2 4.0 3.6 3.8  CL 101 105 101 103  CO2 24 26 26 26   GLUCOSE 139* 93 116* 89  BUN 13 7 6 6   CREATININE 0.73 0.57 0.59 0.54  CALCIUM 9.4 8.3* 8.9 9.0  MG  --  2.0  --   --   PHOS  --  3.3  --   --    GFR: Estimated Creatinine Clearance: 79.6 mL/min (by C-G formula based on SCr of 0.54 mg/dL). Recent Labs  Lab 09/24/24 2012  AST 23  ALT 35  ALKPHOS 92  BILITOT 0.2  PROT 7.6  ALBUMIN 4.2   No results for input(s): LIPASE, AMYLASE in the last 168 hours. No results for input(s): AMMONIA in the last 168 hours. Coagulation Profile:  Recent Labs   Lab 09/24/24 2012  INR 1.1   Unresulted Labs (From admission, onward)     Start     Ordered   09/26/24 0500  CBC  Daily,   R      09/25/24 0828   09/26/24 0500  Basic metabolic panel with GFR  Daily,   R      09/25/24 0828           Antimicrobials/Microbiology: Anti-infectives (From admission, onward)    Start     Dose/Rate Route Frequency Ordered Stop   09/25/24 1000  linezolid (ZYVOX) IVPB 600 mg        600 mg 300 mL/hr over 60 Minutes Intravenous Every 12 hours 09/25/24 0357     09/25/24 0800  metroNIDAZOLE  (FLAGYL ) IVPB 500 mg        500 mg 100 mL/hr over 60 Minutes Intravenous Every 12 hours 09/25/24 0357     09/25/24 0400  ceFEPIme (MAXIPIME) 2 g in sodium chloride  0.9 % 100 mL IVPB  2 g 200 mL/hr over 30 Minutes Intravenous Every 8 hours 09/25/24 0357     09/24/24 2000  ceFEPIme (MAXIPIME) 2 g in sodium chloride  0.9 % 100 mL IVPB        2 g 200 mL/hr over 30 Minutes Intravenous  Once 09/24/24 1953 09/24/24 2108   09/24/24 2000  metroNIDAZOLE  (FLAGYL ) IVPB 500 mg        500 mg 100 mL/hr over 60 Minutes Intravenous  Once 09/24/24 1953 09/24/24 2205   09/24/24 2000  vancomycin (VANCOCIN) IVPB 1000 mg/200 mL premix        1,000 mg 200 mL/hr over 60 Minutes Intravenous  Once 09/24/24 1953 09/24/24 2324         Component Value Date/Time   SDES  09/24/2024 2012    BLOOD RIGHT ANTECUBITAL Performed at [facility], [address] [place], [place] [address]    SDES  09/24/2024 2012    BLOOD RIGHT ANTECUBITAL Performed at [facility], [address], [place], [place] [address]    SPECREQUEST  09/24/2024 2012    BOTTLES DRAWN AEROBIC AND ANAEROBIC Blood Culture results may not be optimal due to an inadequate volume of blood received in culture bottles Performed at [facility], [address], [place], [place] [address]    SPECREQUEST  09/24/2024 2012    BOTTLES DRAWN AEROBIC AND ANAEROBIC Blood  Culture results may not be optimal due to an inadequate volume of blood received in culture bottles Performed at [facility], [address] [place], [place] [address]    CULT (A) 09/24/2024 2012    STAPHYLOCOCCUS EPIDERMIDIS THE SIGNIFICANCE OF ISOLATING THIS ORGANISM FROM A SINGLE SET OF BLOOD CULTURES WHEN MULTIPLE SETS ARE DRAWN IS UNCERTAIN. PLEASE NOTIFY THE MICROBIOLOGY DEPARTMENT WITHIN ONE WEEK IF SPECIATION AND SENSITIVITIES ARE REQUIRED. Performed at [facility], [address] [place], [place] [address]    CULT  09/24/2024 2012    NO GROWTH 3 DAYS Performed at [facility], [address] [place], [place] [address]    REPTSTATUS 09/27/2024 FINAL 09/24/2024 2012   REPTSTATUS PENDING 09/24/2024 2012  Procedures:  Mennie LAMY, MD [facility] 09/27/2024, 10:34 AM

## 2024-09-27 NOTE — Progress Notes (Signed)
   09/27/24 1408  TOC Brief Assessment  Insurance and Status Reviewed  Patient has primary care physician Yes  Home environment has been reviewed home with sefl  Prior level of function: independent  Prior/Current Home Services No current home services  Social Drivers of Health Review SDOH reviewed no interventions necessary  Readmission risk has been reviewed Yes  Transition of care needs no transition of care needs at this time

## 2024-09-27 NOTE — Progress Notes (Signed)
 I attempted follow up with Wanda Garcia, but she was resting at the time.

## 2024-09-28 ENCOUNTER — Inpatient Hospital Stay (HOSPITAL_COMMUNITY): Admitting: Certified Registered Nurse Anesthetist

## 2024-09-28 ENCOUNTER — Inpatient Hospital Stay: Admitting: Hematology and Oncology

## 2024-09-28 ENCOUNTER — Encounter (HOSPITAL_COMMUNITY)
Admission: EM | Disposition: A | Discharge status: Home / Self Care | Payer: Self-pay | Source: Ambulatory Visit | Attending: Internal Medicine

## 2024-09-28 ENCOUNTER — Encounter (HOSPITAL_COMMUNITY): Payer: Self-pay | Admitting: Internal Medicine

## 2024-09-28 ENCOUNTER — Inpatient Hospital Stay: Attending: Licensed Clinical Social Worker | Admitting: Licensed Clinical Social Worker

## 2024-09-28 DIAGNOSIS — C773 Secondary and unspecified malignant neoplasm of axilla and upper limb lymph nodes: Secondary | ICD-10-CM | POA: Insufficient documentation

## 2024-09-28 DIAGNOSIS — Z170 Estrogen receptor positive status [ER+]: Secondary | ICD-10-CM | POA: Insufficient documentation

## 2024-09-28 DIAGNOSIS — N179 Acute kidney failure, unspecified: Secondary | ICD-10-CM | POA: Diagnosis not present

## 2024-09-28 DIAGNOSIS — A419 Sepsis, unspecified organism: Secondary | ICD-10-CM | POA: Diagnosis not present

## 2024-09-28 DIAGNOSIS — L03112 Cellulitis of left axilla: Secondary | ICD-10-CM | POA: Diagnosis not present

## 2024-09-28 DIAGNOSIS — R6520 Severe sepsis without septic shock: Secondary | ICD-10-CM | POA: Diagnosis not present

## 2024-09-28 DIAGNOSIS — C50412 Malignant neoplasm of upper-outer quadrant of left female breast: Secondary | ICD-10-CM | POA: Insufficient documentation

## 2024-09-28 DIAGNOSIS — Z9221 Personal history of antineoplastic chemotherapy: Secondary | ICD-10-CM | POA: Insufficient documentation

## 2024-09-28 HISTORY — PX: IRRIGATION AND DEBRIDEMENT HEMATOMA: SHX5254

## 2024-09-28 LAB — CBC
HCT: 34.3 % — ABNORMAL LOW (ref 36.0–46.0)
Hemoglobin: 11.2 g/dL — ABNORMAL LOW (ref 12.0–15.0)
MCH: 25.5 pg — ABNORMAL LOW (ref 26.0–34.0)
MCHC: 32.7 g/dL (ref 30.0–36.0)
MCV: 78.1 fL — ABNORMAL LOW (ref 80.0–100.0)
Platelets: 404 K/uL — ABNORMAL HIGH (ref 150–400)
RBC: 4.39 MIL/uL (ref 3.87–5.11)
RDW: 12.3 % (ref 11.5–15.5)
WBC: 11 K/uL — ABNORMAL HIGH (ref 4.0–10.5)
nRBC: 0 % (ref 0.0–0.2)

## 2024-09-28 LAB — BASIC METABOLIC PANEL WITH GFR
Anion gap: 11 (ref 5–15)
BUN: 8 mg/dL (ref 6–20)
CO2: 22 mmol/L (ref 22–32)
Calcium: 8.7 mg/dL — ABNORMAL LOW (ref 8.9–10.3)
Chloride: 100 mmol/L (ref 98–111)
Creatinine, Ser: 0.51 mg/dL (ref 0.44–1.00)
GFR, Estimated: >60 mL/min (ref >60)
Glucose, Bld: 94 mg/dL (ref 70–99)
Potassium: 3.9 mmol/L (ref 3.5–5.1)
Sodium: 134 mmol/L — ABNORMAL LOW (ref 135–145)

## 2024-09-28 SURGERY — IRRIGATION AND DEBRIDEMENT HEMATOMA
Anesthesia: General | Laterality: Left

## 2024-09-28 MED ORDER — 0.9 % SODIUM CHLORIDE (POUR BTL) OPTIME
TOPICAL | Status: DC | PRN
  Administered 2024-09-28: 1000 mL

## 2024-09-28 MED ORDER — PROPOFOL 10 MG/ML IV BOLUS
INTRAVENOUS | Status: DC | PRN
  Administered 2024-09-28: 160 mg via INTRAVENOUS

## 2024-09-28 MED ORDER — PROPOFOL 500 MG/50ML IV EMUL
INTRAVENOUS | Status: DC | PRN
  Administered 2024-09-28: 150 ug/kg/min via INTRAVENOUS

## 2024-09-28 MED ORDER — LIDOCAINE HCL (PF) 1 % IJ SOLN
INTRAMUSCULAR | Status: AC
  Filled 2024-09-28: qty 30

## 2024-09-28 MED ORDER — BUPIVACAINE-EPINEPHRINE 0.25% -1:200000 IJ SOLN
INTRAMUSCULAR | Status: DC | PRN
  Administered 2024-09-28: 30 mL

## 2024-09-28 MED ORDER — BUPIVACAINE-EPINEPHRINE (PF) 0.25% -1:200000 IJ SOLN
INTRAMUSCULAR | Status: AC
  Filled 2024-09-28: qty 30

## 2024-09-28 MED ORDER — DROPERIDOL 2.5 MG/ML IJ SOLN: 0.6250 mg | Freq: Once | INTRAMUSCULAR | Status: DC | PRN

## 2024-09-28 MED ORDER — ONDANSETRON HCL 4 MG/2ML IJ SOLN
INTRAMUSCULAR | Status: DC | PRN
  Administered 2024-09-28: 4 mg via INTRAVENOUS

## 2024-09-28 MED ORDER — DEXMEDETOMIDINE HCL IN NACL 80 MCG/20ML IV SOLN
INTRAVENOUS | Status: DC | PRN
  Administered 2024-09-28: 8 ug via INTRAVENOUS

## 2024-09-28 MED ORDER — FENTANYL CITRATE (PF) 100 MCG/2ML IJ SOLN
INTRAMUSCULAR | Status: AC
  Filled 2024-09-28: qty 2

## 2024-09-28 MED ORDER — HYDROMORPHONE HCL 1 MG/ML IJ SOLN: 0.2500 mg | INTRAMUSCULAR | Status: DC | PRN

## 2024-09-28 MED ORDER — OXYCODONE HCL 5 MG PO TABS: 5.0000 mg | ORAL_TABLET | Freq: Once | ORAL | Status: DC | PRN

## 2024-09-28 MED ORDER — DEXAMETHASONE SOD PHOSPHATE PF 10 MG/ML IJ SOLN
INTRAMUSCULAR | Status: DC | PRN
  Administered 2024-09-28: 10 mg via INTRAVENOUS

## 2024-09-28 MED ORDER — HYDROMORPHONE HCL 1 MG/ML IJ SOLN
0.5000 mg | INTRAMUSCULAR | Status: DC | PRN
  Administered 2024-09-28: 1 mg via INTRAVENOUS
  Filled 2024-09-28 (×3): qty 1

## 2024-09-28 MED ORDER — LACTATED RINGERS IV SOLN: INTRAVENOUS | Status: DC

## 2024-09-28 MED ORDER — MIDAZOLAM HCL 2 MG/2ML IJ SOLN
INTRAMUSCULAR | Status: AC
  Filled 2024-09-28: qty 2

## 2024-09-28 MED ORDER — MIDAZOLAM HCL (PF) 2 MG/2ML IJ SOLN
INTRAMUSCULAR | Status: DC | PRN
  Administered 2024-09-28 (×2): 1 mg via INTRAVENOUS

## 2024-09-28 MED ORDER — LIDOCAINE 2% (20 MG/ML) 5 ML SYRINGE
INTRAMUSCULAR | Status: DC | PRN
  Administered 2024-09-28: 80 mg via INTRAVENOUS

## 2024-09-28 MED ORDER — FENTANYL CITRATE (PF) 250 MCG/5ML IJ SOLN
INTRAMUSCULAR | Status: DC | PRN
Start: 2024-09-28 — End: 2024-09-28
  Administered 2024-09-28 (×3): 50 ug via INTRAVENOUS
  Administered 2024-09-28 (×2): 25 ug via INTRAVENOUS

## 2024-09-28 MED ORDER — LIDOCAINE HCL (PF) 1 % IJ SOLN
INTRAMUSCULAR | Status: DC | PRN
  Administered 2024-09-28: 30 mL

## 2024-09-28 MED ORDER — OXYCODONE HCL 5 MG/5ML PO SOLN: 5.0000 mg | Freq: Once | ORAL | Status: DC | PRN

## 2024-09-28 SURGICAL SUPPLY — 33 items
BAG COUNTER SPONGE SURGICOUNT (BAG) IMPLANT
BENZOIN TINCTURE PRP APPL 2/3 (GAUZE/BANDAGES/DRESSINGS) IMPLANT
BINDER BREAST XLRG (GAUZE/BANDAGES/DRESSINGS) IMPLANT
BNDG GAUZE DERMACEA FLUFF 4 (GAUZE/BANDAGES/DRESSINGS) IMPLANT
CHLORAPREP W/TINT 26 (MISCELLANEOUS) IMPLANT
CNTNR URN SCR LID CUP LEK RST (MISCELLANEOUS) IMPLANT
DRAIN CHANNEL 19F RND (DRAIN) IMPLANT
DRAPE SHEET LG 3/4 BI-LAMINATE (DRAPES) ×1 IMPLANT
DRSG TEGADERM 4X4.75 (GAUZE/BANDAGES/DRESSINGS) IMPLANT
ELECT REM PT RETURN 15FT ADLT (MISCELLANEOUS) ×1 IMPLANT
EVACUATOR SILICONE 100CC (DRAIN) IMPLANT
GAUZE SPONGE 4X4 12PLY STRL (GAUZE/BANDAGES/DRESSINGS) ×1 IMPLANT
GLOVE BIOGEL PI IND STRL 7.0 (GLOVE) ×1 IMPLANT
GLOVE ECLIPSE 8.0 STRL XLNG CF (GLOVE) ×1 IMPLANT
GLOVE INDICATOR 8.0 STRL GRN (GLOVE) ×2 IMPLANT
GOWN STRL REUS W/ TWL XL LVL3 (GOWN DISPOSABLE) ×3 IMPLANT
KIT BASIN OR (CUSTOM PROCEDURE TRAY) ×1 IMPLANT
KIT TURNOVER KIT A (KITS) ×1 IMPLANT
NDL SAFETY ECLIPSE 18X1.5 (NEEDLE) IMPLANT
NS IRRIG 1000ML POUR BTL (IV SOLUTION) ×1 IMPLANT
PACK GENERAL/GYN (CUSTOM PROCEDURE TRAY) ×1 IMPLANT
POWDER SURGICEL 3.0 GRAM (HEMOSTASIS) IMPLANT
STOCKINETTE 8 INCH (MISCELLANEOUS) IMPLANT
STRIP CLOSURE SKIN 1/2X4 (GAUZE/BANDAGES/DRESSINGS) IMPLANT
SUT ETHILON 2 0 PS N (SUTURE) IMPLANT
SUT MNCRL AB 4-0 PS2 18 (SUTURE) IMPLANT
SUT VIC AB 3-0 SH 8-18 (SUTURE) IMPLANT
SWAB COLLECTION DEVICE MRSA (MISCELLANEOUS) IMPLANT
SWAB CULTURE ESWAB REG 1ML (MISCELLANEOUS) IMPLANT
SYR 20ML LL LF (SYRINGE) IMPLANT
SYR CONTROL 10ML LL (SYRINGE) IMPLANT
TOWEL OR DSP ST BLU DLX 10/PK (DISPOSABLE) ×1 IMPLANT
UNDERPAD 30X36 HEAVY ABSORB (UNDERPADS AND DIAPERS) IMPLANT

## 2024-09-28 NOTE — Progress Notes (Addendum)
 Patient and Spouse expressed concerns regarding continued surgical team plan of care. Pt has requested that the surgical provider speak with her. Call placed to on-call team.

## 2024-09-28 NOTE — Anesthesia Postprocedure Evaluation (Signed)
 Anesthesia Post Note  Patient: Wanda Garcia  Procedure(s) Performed: IRRIGATION AND DEBRIDEMENT HEMATOMA (Left)     Patient location during evaluation: PACU Anesthesia Type: General Level of consciousness: sedated and patient cooperative Pain management: pain level controlled Vital Signs Assessment: post-procedure vital signs reviewed and stable Respiratory status: spontaneous breathing Cardiovascular status: stable Anesthetic complications: no   No notable events documented.  Last Vitals:  Vitals:   09/28/24 1707 09/28/24 1719  BP: 126/69 137/72  Pulse: 79 84  Resp: 20 16  Temp: 36.8 C 36.8 C  SpO2: 97% 96%    Last Pain:  Vitals:   09/28/24 2227  TempSrc:   PainSc: 7                  Norleen Pope

## 2024-09-28 NOTE — Progress Notes (Signed)
 PROGRESS NOTE Wanda Garcia  FMW:[id number] DOB: 1975/12/19 DOA: 09/24/2024 [facility]: [facility]  Brief Narrative/Hospital Course: Wanda Garcia is a 48 y.o. female with PMH of  for recently diagnosed left breast cancer 03/22/2024 status post chemotherapy (last chemotherapy was about 2 months ago-was stopped prematurely due to chemo induced neuropathy), anxiety, status post left axillary lymphadenopathy on 09/14/2024 with drain, status post port site removal, who presents to the ER sent from urgent care with concern for possible port site infection, generalized weakness and fatigue, nausea, and vomiting.  Also endorses subjective fevers 101.2 at home and associated chills. In the ZM:Uzfe 101.5. BP 114/56, pulse 127, respiration rate 13, O2 saturation 98% on room air. Labs-lactic acid 2.0, leukocytosis 15,000.  Lateral left breast appear erythematous, tender, warm, with purulent drainage from drain site CT chest with contrast : postsurgical changes in the left axilla with generalized edema and surgical drain extending along the left chest wall.  Soft tissue density measuring approximately 7.0 x 3.8 cm posterior laterally within the left breast, likely representing a hematoma given recent surgery and separate from the drainage catheter. Peripheral blood cultures x 2 were obtained.  The patient was started on broad-spectrum IV antibiotics for sepsis, IV vancomycin, cefepime, and IV Flagyl  and admitted.    Subjective: Seen and examined A lot of pain this morning and improved after taking 2 of Oxy Increased redness in the left breast area. No new complaints Tmax one 101.3 yesterday 3 PM and afebrile since, labs with WBC at 11, renal function normal  Assessment and plan:  Sepsis POA from left base cellulitis/suspected drain site infection, port site infection, POA Left breast cancer s/p lumpectomy, targeted/sentinel node 10/7 and then completion ALND 10/29 Cellulitis of the left  breast Hematoma on the left breast Recurrent fever Staph epidermidis in 1 set-likely contamination: Recent left axillary lymphadenopathy on 09/14/2024 with drain, s/p recent port site removal. Having recurrent fever, and despite being on antibiotics left breast were red and tender Blood cx with Staph epidermidis?  Contamination as 1/2 set. Dr Dorene ob-given lack of improvement plan for washout 11/12. Drain removed 11/10 as drain not communicating w/ hematoma Cont on  cefepime,Zyvox and Flagyl . Cont pain control with oral Oxy, tylenol , did not tolerate morphine  Recent Labs  Lab 09/24/24 2012 09/24/24 2021 09/24/24 2212 09/25/24 0819 09/26/24 0414 09/27/24 0424 09/28/24 0410  WBC 15.0*  --   --  14.9* 13.6* 10.9* 11.0*  LATICACIDVEN  --  2.0* 1.3  --   --   --   --     Suspected red man syndrome after IV vancomycin infusion IV vancomycin has been discontinued,     Tobacco use disorder Cessation counseling    Left breast cancer status postchemotherapy Chemo-induced polyneuropathy Chemotherapy was interrupted, about 2 months ago, due to chemo induced polyneuropathy.F/b Dr. Odean  Anemia likely multifactorial from acute illness and malignancy: Hemoglobin stable.  Monitor   Class I Obesity w/ Body mass index is 34.4 kg/m.: Will benefit with PCP follow-up, weight loss,healthy lifestyle  Generalized weakness:  Ambulatory at baseline  DVT prophylaxis: SCDs Start: 09/24/24 2245 refusing pork derivatives including Lovenox and heparin . Code Status:   Code Status: Full Code Family Communication: plan of care discussed with patient. Patient status is: Remains hospitalized because of severity of illness Level of care: Progressive   Dispo: The patient is from:Home            Anticipated disposition: TBD Objective: Vitals last 24 hrs: Vitals:   09/27/24 1951  09/28/24 0554 09/28/24 0747 09/28/24 0835  BP: 121/77 128/78 135/83 139/86  Pulse: 94 92 89 87  Resp: 17 18 20 16    Temp: 98.6 F (37 C) 99.6 F (37.6 C) 99.8 F (37.7 C) (!) 97.5 F (36.4 C)  TempSrc: Oral Oral Oral Oral  SpO2: 98% 98% 99% 99%  Weight:      Height:        Physical Examination: General exam: AAOX3 HEENT:Oral mucosa moist, Ear/Nose WNL grossly Respiratory system: CTA B/L. Cardiovascular system: S1 & S2 +, No JVD. Gastrointestinal system: Abdomen soft,NT,ND, BS+ Nervous System: Alert, awake, moving all extremities,and following commands. Extremities: extremities warm, leg edema neg Skin: Warm, no rashes, tender left axilla area with dressing in place, area of erythema tenderness swelling ++ MSK: Normal muscle bulk,tone, power   Medications reviewed:  Scheduled Meds: Continuous Infusions:  ceFEPime (MAXIPIME) IV 2 g (09/28/24 0415)   lactated ringers  Stopped (09/27/24 1758)   linezolid (ZYVOX) IV 600 mg (09/28/24 1013)   metronidazole  500 mg (09/28/24 9166)   Diet: Diet Order             Diet NPO time specified Except for: Sips with Meds  Diet effective midnight                   Data Reviewed: I have personally reviewed following labs and imaging studies ( see epic result tab) CBC: Recent Labs  Lab 09/24/24 2012 09/25/24 0819 09/26/24 0414 09/27/24 0424 09/28/24 0410  WBC 15.0* 14.9* 13.6* 10.9* 11.0*  NEUTROABS 11.9* 11.9*  --   --   --   HGB 13.0 11.6* 10.8* 11.0* 11.2*  HCT 41.7 37.8 34.3* 34.5* 34.3*  MCV 79.0* 80.6 80.7 79.9* 78.1*  PLT 382 300 312 385 404*   CMP: Recent Labs  Lab 09/24/24 2012 09/25/24 0819 09/26/24 0414 09/27/24 0424 09/28/24 0410  NA 136 138 135 139 134*  K 4.2 4.0 3.6 3.8 3.9  CL 101 105 101 103 100  CO2 24 26 26 26 22   GLUCOSE 139* 93 116* 89 94  BUN 13 7 6 6 8   CREATININE 0.73 0.57 0.59 0.54 0.51  CALCIUM 9.4 8.3* 8.9 9.0 8.7*  MG  --  2.0  --   --   --   PHOS  --  3.3  --   --   --    GFR: Estimated Creatinine Clearance: 79.6 mL/min (by C-G formula based on SCr of 0.51 mg/dL). Recent Labs  Lab 09/24/24 2012   AST 23  ALT 35  ALKPHOS 92  BILITOT 0.2  PROT 7.6  ALBUMIN 4.2   No results for input(s): LIPASE, AMYLASE in the last 168 hours. No results for input(s): AMMONIA in the last 168 hours. Coagulation Profile:  Recent Labs  Lab 09/24/24 2012  INR 1.1   Unresulted Labs (From admission, onward)     Start     Ordered   09/26/24 0500  CBC  Daily,   R      09/25/24 0828   09/26/24 0500  Basic metabolic panel with GFR  Daily,   R      09/25/24 0828           Antimicrobials/Microbiology: Anti-infectives (From admission, onward)    Start     Dose/Rate Route Frequency Ordered Stop   09/25/24 1000  linezolid (ZYVOX) IVPB 600 mg        600 mg 300 mL/hr over 60 Minutes Intravenous Every 12 hours 09/25/24 0357  09/25/24 0800  metroNIDAZOLE  (FLAGYL ) IVPB 500 mg        500 mg 100 mL/hr over 60 Minutes Intravenous Every 12 hours 09/25/24 0357     09/25/24 0400  ceFEPIme (MAXIPIME) 2 g in sodium chloride  0.9 % 100 mL IVPB        2 g 200 mL/hr over 30 Minutes Intravenous Every 8 hours 09/25/24 0357     09/24/24 2000  ceFEPIme (MAXIPIME) 2 g in sodium chloride  0.9 % 100 mL IVPB        2 g 200 mL/hr over 30 Minutes Intravenous  Once 09/24/24 1953 09/24/24 2108   09/24/24 2000  metroNIDAZOLE  (FLAGYL ) IVPB 500 mg        500 mg 100 mL/hr over 60 Minutes Intravenous  Once 09/24/24 1953 09/24/24 2205   09/24/24 2000  vancomycin (VANCOCIN) IVPB 1000 mg/200 mL premix        1,000 mg 200 mL/hr over 60 Minutes Intravenous  Once 09/24/24 1953 09/24/24 2324         Component Value Date/Time   SDES  09/24/2024 2012    BLOOD RIGHT ANTECUBITAL Performed at [facility], [address] [place], [place] [address]    SDES  09/24/2024 2012    BLOOD RIGHT ANTECUBITAL Performed at [facility], [address], [place], [place] [address]    SPECREQUEST  09/24/2024 2012    BOTTLES DRAWN AEROBIC AND ANAEROBIC Blood Culture results may not be optimal  due to an inadequate volume of blood received in culture bottles Performed at [facility], [address], [place], [place] [address]    SPECREQUEST  09/24/2024 2012    BOTTLES DRAWN AEROBIC AND ANAEROBIC Blood Culture results may not be optimal due to an inadequate volume of blood received in culture bottles Performed at [facility], [address], [place], [place] [address]    CULT (A) 09/24/2024 2012    STAPHYLOCOCCUS EPIDERMIDIS THE SIGNIFICANCE OF ISOLATING THIS ORGANISM FROM A SINGLE SET OF BLOOD CULTURES WHEN MULTIPLE SETS ARE DRAWN IS UNCERTAIN. PLEASE NOTIFY THE MICROBIOLOGY DEPARTMENT WITHIN ONE WEEK IF SPECIATION AND SENSITIVITIES ARE REQUIRED. Performed at [facility], [address] [place], [place] [address]    CULT  09/24/2024 2012    NO GROWTH 4 DAYS Performed at [facility], [address], [place], [place] [address]    REPTSTATUS 09/27/2024 FINAL 09/24/2024 2012   REPTSTATUS PENDING 09/24/2024 2012  Procedures:Procedure(s) (LRB): IRRIGATION AND DEBRIDEMENT HEMATOMA (Left)  Mennie LAMY, MD [facility] 09/28/2024, 11:05 AM

## 2024-09-28 NOTE — Interval H&P Note (Signed)
 History and Physical Interval Note:  09/28/2024 2:25 PM  Wanda Garcia  has presented today for surgery, with the diagnosis of left axilla, hematoma.  The various methods of treatment have been discussed with the patient and family. After consideration of risks, benefits and other options for treatment, the patient has consented to  Procedure(s) with comments: IRRIGATION AND DEBRIDEMENT HEMATOMA (Left) - washout left axilla as a surgical intervention.  The patient's history has been reviewed, patient examined, no change in status, stable for surgery.  I have reviewed the patient's chart and labs.  Questions were answered to the patient's satisfaction.     Jina Nephew

## 2024-09-28 NOTE — Transfer of Care (Signed)
 Immediate Anesthesia Transfer of Care Note  Patient: Wanda Garcia  Procedure(s) Performed: IRRIGATION AND DEBRIDEMENT HEMATOMA (Left)  Patient Location: PACU  Anesthesia Type:General  Level of Consciousness: drowsy and patient cooperative  Airway & Oxygen Therapy: Patient Spontanous Breathing and Patient connected to face mask oxygen  Post-op Assessment: Report given to RN and Post -op Vital signs reviewed and stable  Post vital signs: Reviewed and stable  Last Vitals:  Vitals Value Taken Time  BP 108/47 09/28/24 16:10  Temp    Pulse 81 09/28/24 16:14  Resp 19 09/28/24 16:14  SpO2 98 % 09/28/24 16:14  Vitals shown include unfiled device data.  Last Pain:  Vitals:   09/28/24 1250  TempSrc: Oral  PainSc:          Complications: No notable events documented.

## 2024-09-28 NOTE — Progress Notes (Signed)
 [facility] Clinical Social Work  Clinical Social Work was referred by CANDIE Georgi for financial concerns.  Clinical Social Worker contacted caregiver by phone to offer support and assess for needs.     Interventions: Referred patient to community resources: [employer]  Completed application for [employer]ICE [employer] Provided information on health net pantries Provided information on [employer] and what type of bill will be accepted for an application for assistance      Follow Up Plan:  Patient will contact CSW with any support or resource needs    Nishan Ovens E Kaydee Magel, LCSW  Clinical Social Worker [facility]        Patient is participating in a Managed Medicaid Plan:  Yes

## 2024-09-28 NOTE — Op Note (Signed)
 PRE-OPERATIVE DIAGNOSIS: left breast cancer, stage IIIa, [id number], grade 1 invasive ductal carcinoma, post op infected hematoma with cellulitis following completion ALND  POST-OPERATIVE DIAGNOSIS:  Same  PROCEDURE:  Procedure(s): Evacuation and irrigation of left axillary hematoma  SURGEON:  Surgeon(s): Jina Nephew, MD  ANESTHESIA:   general  DRAINS: (19 Fr) Blake drain(s) in the left axilla   LOCAL MEDICATIONS USED:  BUPIVICAINE  and LIDOCAINE    SPECIMEN:  Source of Specimen:  culture left axillary contents  DISPOSITION OF SPECIMEN:  micro  COUNTS:  YES  DICTATION: .Dragon Dictation  PLAN OF CARE: Back to inpatient room  PATIENT DISPOSITION:  PACU - hemodynamically stable.  FINDINGS:  infected seroma  EBL: minimal  PROCEDURE:   Patient was identified in the holding area and taken to the OR where she was placed supine on the OR table.  General anesthesia was induced. Left arm and chest were prepped and draped in sterile fashion.  A time out was performed according to the surgical safety checklist.  When all was correct, we continued.   The left axillary incision was opened.  Large murky fluid collection present.  Cultures sent. Cavity washed out.  Breast aspirated and clear seroma seen.  Also sent to microbiology.  Hemostasis achieved.  Powdered surgicel placed in wound.  New 19 Fr blake drain placed and secured with 2-0 nylon.    Skin closed with 3-0 vicryl deep dermal sutures and 4-0 monocryl running subcuticular suture.  Wound cleaned, dried, and dressed with benzoin, steristrips, gauze, and breast binder.    The patient was allowed to emerge from anesthesia and then taken to the pacu in stable condition.  Needle, sponge, and instrument counts are correct x 2.

## 2024-09-28 NOTE — Progress Notes (Signed)
 The patient returned from surgical procedure. She is alert, oriented x4. Incision site is clean dry and intact. JP drain to suction.

## 2024-09-28 NOTE — Anesthesia Procedure Notes (Signed)
 Procedure Name: LMA Insertion Date/Time: 09/28/2024 2:57 PM  Performed by: Cena Epps, CRNAPre-anesthesia Checklist: Patient identified, Emergency Drugs available, Suction available and Patient being monitored Patient Re-evaluated:Patient Re-evaluated prior to induction Oxygen Delivery Method: Circle System Utilized Preoxygenation: Pre-oxygenation with 100% oxygen Induction Type: IV induction Ventilation: Mask ventilation without difficulty LMA: LMA inserted LMA Size: 4.0 Number of attempts: 1 Airway Equipment and Method: Bite block Placement Confirmation: positive ETCO2 Tube secured with: Tape Dental Injury: Teeth and Oropharynx as per pre-operative assessment

## 2024-09-28 NOTE — Anesthesia Preprocedure Evaluation (Addendum)
 Anesthesia Evaluation  Patient identified by MRN, date of birth, ID band Patient awake    Reviewed: Allergy & Precautions, NPO status , Patient's Chart, lab work & pertinent test results  History of Anesthesia Complications Negative for: history of anesthetic complications  Airway Mallampati: III  TM Distance: >3 FB Neck ROM: Full    Dental  (+) Dental Advisory Given, Teeth Intact   Pulmonary neg shortness of breath, neg sleep apnea, neg COPD, neg recent URI, Current Smoker and Patient abstained from smoking.   breath sounds clear to auscultation       Cardiovascular negative cardio ROS  Rhythm:Regular Rate:Normal     Neuro/Psych  Headaches, neg Seizures PSYCHIATRIC DISORDERS  Depression       GI/Hepatic negative GI ROS, Neg liver ROS,,,  Endo/Other  negative endocrine ROS    Renal/GU negative Renal ROS     Musculoskeletal  (+) Arthritis ,    Abdominal  (+) + obese  Peds  Hematology negative hematology ROS (+)   Anesthesia Other Findings   Reproductive/Obstetrics                              Anesthesia Physical Anesthesia Plan  ASA: 2  Anesthesia Plan: General   Post-op Pain Management: Tylenol  PO (pre-op)*   Induction: Intravenous  PONV Risk Score and Plan: 3 and Ondansetron , Dexamethasone , Propofol  infusion, TIVA, Midazolam  and Treatment may vary due to age or medical condition  Airway Management Planned: LMA and Oral ETT  Additional Equipment: None  Intra-op Plan:   Post-operative Plan: Extubation in OR  Informed Consent: I have reviewed the patients History and Physical, chart, labs and discussed the procedure including the risks, benefits and alternatives for the proposed anesthesia with the patient or authorized representative who has indicated his/her understanding and acceptance.     Dental advisory given  Plan Discussed with: CRNA  Anesthesia Plan Comments:           Anesthesia Quick Evaluation

## 2024-09-29 ENCOUNTER — Encounter (HOSPITAL_COMMUNITY): Payer: Self-pay | Admitting: General Surgery

## 2024-09-29 DIAGNOSIS — R6520 Severe sepsis without septic shock: Secondary | ICD-10-CM | POA: Diagnosis not present

## 2024-09-29 DIAGNOSIS — N179 Acute kidney failure, unspecified: Secondary | ICD-10-CM | POA: Diagnosis not present

## 2024-09-29 DIAGNOSIS — A419 Sepsis, unspecified organism: Secondary | ICD-10-CM | POA: Diagnosis not present

## 2024-09-29 LAB — BASIC METABOLIC PANEL WITH GFR
Anion gap: 10 (ref 5–15)
BUN: 12 mg/dL (ref 6–20)
CO2: 25 mmol/L (ref 22–32)
Calcium: 9.1 mg/dL (ref 8.9–10.3)
Chloride: 101 mmol/L (ref 98–111)
Creatinine, Ser: 0.48 mg/dL (ref 0.44–1.00)
GFR, Estimated: >60 mL/min (ref >60)
Glucose, Bld: 135 mg/dL — ABNORMAL HIGH (ref 70–99)
Potassium: 4.1 mmol/L (ref 3.5–5.1)
Sodium: 136 mmol/L (ref 135–145)

## 2024-09-29 LAB — CBC
HCT: 36.1 % (ref 36.0–46.0)
Hemoglobin: 11.5 g/dL — ABNORMAL LOW (ref 12.0–15.0)
MCH: 24.9 pg — ABNORMAL LOW (ref 26.0–34.0)
MCHC: 31.9 g/dL (ref 30.0–36.0)
MCV: 78.1 fL — ABNORMAL LOW (ref 80.0–100.0)
Platelets: 439 K/uL — ABNORMAL HIGH (ref 150–400)
RBC: 4.62 MIL/uL (ref 3.87–5.11)
RDW: 12.3 % (ref 11.5–15.5)
WBC: 7.2 K/uL (ref 4.0–10.5)
nRBC: 0 % (ref 0.0–0.2)

## 2024-09-29 LAB — CULTURE, BLOOD (ROUTINE X 2): Culture: NO GROWTH

## 2024-09-29 NOTE — Progress Notes (Signed)
 1 Day Post-Op   Subjective/Chief Complaint: Improving, but still having a lot of pain and needing IV pain meds.    Objective: Vital signs in last 24 hours: Temp:  [98 F (36.7 C)-98.7 F (37.1 C)] 98.3 F (36.8 C) (11/13 1309) Pulse Rate:  [75-89] 89 (11/13 1309) Resp:  [16-28] 18 (11/13 1309) BP: (100-137)/(47-76) 100/59 (11/13 1309) SpO2:  [92 %-99 %] 96 % (11/13 1309) Last BM Date : 09/24/24  Intake/Output from previous day: 11/12 0701 - 11/13 0700 In: 1320 [P.O.:120; I.V.:800; IV Piggyback:400] Out: 2155 [Urine:2150; Blood:5] Intake/Output this shift: No intake/output data recorded.  General:  looks uncomfortable Still with cellulitis, but improving.  Drain with no output  Lab Results:  Recent Labs    09/28/24 0410 09/29/24 0417  WBC 11.0* 7.2  HGB 11.2* 11.5*  HCT 34.3* 36.1  PLT 404* 439*   BMET Recent Labs    09/28/24 0410 09/29/24 0417  NA 134* 136  K 3.9 4.1  CL 100 101  CO2 22 25  GLUCOSE 94 135*  BUN 8 12  CREATININE 0.51 0.48  CALCIUM 8.7* 9.1   PT/INR No results for input(s): LABPROT, INR in the last 72 hours.  ABG No results for input(s): PHART, HCO3 in the last 72 hours.  Invalid input(s): PCO2, PO2  Studies/Results: No results found.   Anti-infectives: Anti-infectives (From admission, onward)    Start     Dose/Rate Route Frequency Ordered Stop   09/25/24 1000  linezolid (ZYVOX) IVPB 600 mg        600 mg 300 mL/hr over 60 Minutes Intravenous Every 12 hours 09/25/24 0357     09/25/24 0800  metroNIDAZOLE  (FLAGYL ) IVPB 500 mg        500 mg 100 mL/hr over 60 Minutes Intravenous Every 12 hours 09/25/24 0357     09/25/24 0400  ceFEPIme (MAXIPIME) 2 g in sodium chloride  0.9 % 100 mL IVPB        2 g 200 mL/hr over 30 Minutes Intravenous Every 8 hours 09/25/24 0357     09/24/24 2000  ceFEPIme (MAXIPIME) 2 g in sodium chloride  0.9 % 100 mL IVPB        2 g 200 mL/hr over 30 Minutes Intravenous  Once 09/24/24 1953 09/24/24  2108   09/24/24 2000  metroNIDAZOLE  (FLAGYL ) IVPB 500 mg        500 mg 100 mL/hr over 60 Minutes Intravenous  Once 09/24/24 1953 09/24/24 2205   09/24/24 2000  vancomycin (VANCOCIN) IVPB 1000 mg/200 mL premix        1,000 mg 200 mL/hr over 60 Minutes Intravenous  Once 09/24/24 1953 09/24/24 2324       Assessment/Plan: Left breast cancer, s/p lumpectomy, targeted/sentinel node 08/23/2028, then completion ALND 09/14/2024. Takeback for washout of axilla 11/12.   Cellulitis Hematoma.  Fever  Continue IV antibiotics Await cultures from OR Defer duration of antibiotics for + blood culture to medicine.  Most likely this will be adequate to address cellulitis as well.    Continue IV pain meds May pull drain before discharge if still no output for a few more days. Care complicated by lack of social support as husband unable to take off to help her at home.   Will be back tomorrow for recheck.    LOS: 5 days    Jina LITTIE Nephew, MD, FACS, FSSO Surgical Oncology, General Surgery, Trauma and Critical [facility], [place] [phone] for weekday/non holidays Check [email or web address] for coverage night/weekend/holidays under  General Surgery

## 2024-09-29 NOTE — Progress Notes (Signed)
 PROGRESS NOTE Wanda Garcia  FMW:[id number] DOB: 03-15-1976 DOA: 09/24/2024 PCP: [facility]  Brief Narrative/Hospital Course: Wanda Garcia is a 48 y.o. female with PMH of  for recently diagnosed left breast cancer 03/22/2024 status post chemotherapy (last chemotherapy was about 2 months ago-was stopped prematurely due to chemo induced neuropathy), anxiety, status post left axillary lymphadenopathy on 09/14/2024 with drain, status post port site removal, who presents to the ER sent from urgent care with concern for possible port site infection, generalized weakness and fatigue, nausea, and vomiting.  Also endorses subjective fevers 101.2 at home and associated chills. In the ZM:Uzfe 101.5. BP 114/56, pulse 127, respiration rate 13, O2 saturation 98% on room air. Labs-lactic acid 2.0, leukocytosis 15,000.  Lateral left breast appear erythematous, tender, warm, with purulent drainage from drain site CT chest with contrast : postsurgical changes in the left axilla with generalized edema and surgical drain extending along the left chest wall.  Soft tissue density measuring approximately 7.0 x 3.8 cm posterior laterally within the left breast, likely representing a hematoma given recent surgery and separate from the drainage catheter. Peripheral blood cultures x 2 were obtained.  The patient was started on broad-spectrum IV antibiotics for sepsis, IV vancomycin, cefepime, and IV Flagyl  and admitted.    Subjective:  Patient was quite anxious during my visit today, was worried that there is not much drainage from the drain and whether the drain is in the right place.  Still has some left-sided breast discomfort.  Assessment and plan:  Sepsis POA from left base cellulitis/suspected drain site infection, port site infection, POA Left breast cancer s/p lumpectomy, targeted/sentinel node 10/7 and then completion ALND 10/29 Cellulitis of the left breast Hematoma on the left breast Recurrent  fever Staph epidermidis in 1 set-likely contamination: Recent left axillary lymphadenopathy on 09/14/2024 with drain, s/p recent port site removal. She had fever, and despite being on antibiotics left breast were red and tender Blood cx with Staph epidermidis?  Likely Contamination as 1/2 set. Wanda Garcia ob-given lack of improvement, patient was taken to the OR for left axilla washout on 09/28/24.  Initial drain removed 11/10 as drain not communicating w/ hematoma.  New drain in place, however does not have much output. Cont on  cefepime,Zyvox and Flagyl . Cont pain control with oral Oxy, tylenol , did not tolerate morphine ,. surgery recommends continuing antibiotics for now, will follow culture results from the surgery.  Could consider ID consultation or input for antibiotics and duration upon discharge.  Patient without leukocytosis.  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  Monitor   Class I Obesity w/ Body mass index is 34.4 kg/m.: Will benefit with PCP follow-up, weight loss,healthy lifestyle  Generalized weakness:  Ambulatory at baseline  DVT prophylaxis: SCDs Start: 09/24/24 2245 refusing pork derivatives including Lovenox and heparin . Code Status:   Code Status: Full Code Family Communication: plan of care discussed with  patient. Patient status is: Remains hospitalized because of severity of illness Level of  care: Progressive   Dispo: The patient is from:Home            Anticipated disposition: TBD Objective: Vitals last 24 hrs: Vitals:   09/28/24 1719 09/29/24 0025 09/29/24 0543 09/29/24 1309  BP: 137/72 107/67 115/76 (!) 100/59  Pulse: 84 76 79 89  Resp: 16 16 18 18   Temp: 98.2 F (36.8 C) 98 F (36.7 C) 98.1 F (36.7 C) 98.3 F (36.8 C)  TempSrc: Oral   Oral  SpO2: 96% 96% 98% 96%  Weight:      Height:        Physical Examination: General exam: AAOX3 HEENT:Oral mucosa moist, Ear/Nose WNL grossly Respiratory system: CTA B/L. Cardiovascular system: S1 & S2 +, No JVD. Gastrointestinal system: Abdomen soft,NT,ND, BS+ Nervous System: Alert, awake, moving all extremities,and following commands. Extremities: extremities warm, no peripheral edema  skin: Warm, no rashes, dressing in place left axilla MSK: Normal muscle bulk,tone, power   Medications reviewed:  Scheduled Meds: Continuous Infusions:  ceFEPime (MAXIPIME) IV 2 g (09/29/24 1240)   linezolid (ZYVOX) IV 600 mg (09/29/24 1111)   metronidazole  500 mg (09/29/24 0911)   Diet: Diet Order             Diet regular Room service appropriate? Yes; Fluid consistency: Thin  Diet effective now                   Data Reviewed: I have personally reviewed following labs and imaging studies ( see epic result tab) CBC: Recent Labs  Lab 09/24/24 2012 09/25/24 0819 09/26/24 0414 09/27/24 0424 09/28/24 0410 09/29/24 0417  WBC 15.0* 14.9* 13.6* 10.9* 11.0* 7.2  NEUTROABS 11.9* 11.9*  --   --   --   --   HGB 13.0 11.6* 10.8* 11.0* 11.2* 11.5*  HCT 41.7 37.8 34.3* 34.5* 34.3* 36.1  MCV 79.0* 80.6 80.7 79.9* 78.1* 78.1*  PLT 382 300 312 385 404* 439*   CMP: Recent Labs  Lab 09/25/24 0819 09/26/24 0414 09/27/24 0424 09/28/24 0410 09/29/24 0417  NA 138 135 139 134* 136  K 4.0 3.6 3.8 3.9 4.1  CL 105 101 103 100 101  CO2 26 26 26 22 25   GLUCOSE 93 116* 89 94 135*  BUN 7 6 6 8 12   CREATININE 0.57 0.59 0.54 0.51  0.48  CALCIUM 8.3* 8.9 9.0 8.7* 9.1  MG 2.0  --   --   --   --   PHOS 3.3  --   --   --   --    GFR: Estimated Creatinine Clearance: 79.6 mL/min (by C-G formula based on SCr of 0.48 mg/dL). Recent Labs  Lab 09/24/24 2012  AST 23  ALT 35  ALKPHOS 92  BILITOT 0.2  PROT 7.6  ALBUMIN 4.2   No results for input(s): LIPASE, AMYLASE in the last 168 hours. No results for input(s): AMMONIA in the last 168 hours. Coagulation Profile:  Recent Labs  Lab 09/24/24 2012  INR 1.1   Unresulted Labs (From admission, onward)     Start     Ordered   09/26/24 0500  CBC  Daily,   R      09/25/24 0828   09/26/24 0500  Basic metabolic panel with GFR  Daily,   R      09/25/24 0828           Antimicrobials/Microbiology: Anti-infectives (From admission, onward)  Start     Dose/Rate Route Frequency Ordered Stop   09/25/24 1000  linezolid (ZYVOX) IVPB 600 mg        600 mg 300 mL/hr over 60 Minutes Intravenous Every 12 hours 09/25/24 0357     09/25/24 0800  metroNIDAZOLE  (FLAGYL ) IVPB 500 mg        500 mg 100 mL/hr over 60 Minutes Intravenous Every 12 hours 09/25/24 0357     09/25/24 0400  ceFEPIme (MAXIPIME) 2 g in sodium chloride  0.9 % 100 mL IVPB        2 g 200 mL/hr over 30 Minutes Intravenous Every 8 hours 09/25/24 0357     09/24/24 2000  ceFEPIme (MAXIPIME) 2 g in sodium chloride  0.9 % 100 mL IVPB        2 g 200 mL/hr over 30 Minutes Intravenous  Once 09/24/24 1953 09/24/24 2108   09/24/24 2000  metroNIDAZOLE  (FLAGYL ) IVPB 500 mg        500 mg 100 mL/hr over 60 Minutes Intravenous  Once 09/24/24 1953 09/24/24 2205   09/24/24 2000  vancomycin (VANCOCIN) IVPB 1000 mg/200 mL premix        1,000 mg 200 mL/hr over 60 Minutes Intravenous  Once 09/24/24 1953 09/24/24 2324         Component Value Date/Time   SDES  09/28/2024 1524    ABSCESS Performed at [facility], [address], [place], [place] [address]    SPECREQUEST  09/28/2024 1524    L BREAST  SEROMA Performed at [facility], [address], [place], [place] [address]    CULT  09/28/2024 1524    NO GROWTH < 12 HOURS Performed at [facility], [address], [place], [place] [address]    REPTSTATUS PENDING 09/28/2024 1524  Procedures:Procedure(s) (LRB): IRRIGATION AND DEBRIDEMENT HEMATOMA (Left)  MARGARETMARY DELENA HALEY, MD [facility] 09/29/2024, 4:27 PM

## 2024-09-30 ENCOUNTER — Inpatient Hospital Stay (HOSPITAL_COMMUNITY)

## 2024-09-30 DIAGNOSIS — A4101 Sepsis due to Methicillin susceptible Staphylococcus aureus: Secondary | ICD-10-CM | POA: Diagnosis not present

## 2024-09-30 DIAGNOSIS — R6520 Severe sepsis without septic shock: Secondary | ICD-10-CM | POA: Diagnosis not present

## 2024-09-30 LAB — BASIC METABOLIC PANEL WITH GFR
Anion gap: 12 (ref 5–15)
BUN: 9 mg/dL (ref 6–20)
CO2: 24 mmol/L (ref 22–32)
Calcium: 9.3 mg/dL (ref 8.9–10.3)
Chloride: 103 mmol/L (ref 98–111)
Creatinine, Ser: 0.51 mg/dL (ref 0.44–1.00)
GFR, Estimated: >60 mL/min (ref >60)
Glucose, Bld: 85 mg/dL (ref 70–99)
Potassium: 3.8 mmol/L (ref 3.5–5.1)
Sodium: 139 mmol/L (ref 135–145)

## 2024-09-30 LAB — CBC
HCT: 36.7 % (ref 36.0–46.0)
Hemoglobin: 11.6 g/dL — ABNORMAL LOW (ref 12.0–15.0)
MCH: 24.9 pg — ABNORMAL LOW (ref 26.0–34.0)
MCHC: 31.6 g/dL (ref 30.0–36.0)
MCV: 78.9 fL — ABNORMAL LOW (ref 80.0–100.0)
Platelets: 446 K/uL — ABNORMAL HIGH (ref 150–400)
RBC: 4.65 MIL/uL (ref 3.87–5.11)
RDW: 12.6 % (ref 11.5–15.5)
WBC: 6.7 K/uL (ref 4.0–10.5)
nRBC: 0 % (ref 0.0–0.2)

## 2024-09-30 NOTE — Progress Notes (Signed)
 PROGRESS NOTE [name]  FMW:[id number] DOB: January 14, 1976 DOA: 09/24/2024 PCP: [facility]  Brief Narrative/Hospital Course: Wanda Garcia is a 48 y.o. female with PMH of  for recently diagnosed left breast cancer 03/22/2024 status post chemotherapy (last chemotherapy was about 2 months ago-was stopped prematurely due to chemo induced neuropathy), anxiety, status post left axillary lymphadenopathy on 09/14/2024 with drain, status post port site removal, who presents to the ER sent from urgent care with concern for possible port site infection, generalized weakness and fatigue, nausea, and vomiting.  Also endorses subjective fevers 101.2 at home and associated chills. In the ZM:Uzfe 101.5. BP 114/56, pulse 127, respiration rate 13, O2 saturation 98% on room air. Labs-lactic acid 2.0, leukocytosis 15,000.  Lateral left breast appear erythematous, tender, warm, with purulent drainage from drain site CT chest with contrast : postsurgical changes in the left axilla with generalized edema and surgical drain extending along the left chest wall.  Soft tissue density measuring approximately 7.0 x 3.8 cm posterior laterally within the left breast, likely representing a hematoma given recent surgery and separate from the drainage catheter. Peripheral blood cultures x 2 were obtained.  The patient was started on broad-spectrum IV antibiotics for sepsis, IV vancomycin, cefepime, and IV Flagyl  and admitted.    Subjective:  Patient complained her IV on her right arm hurting a lot, she requested RN to remove access due to pain. Her abx were held. she has  was worried that there is not much drainage from the drain . Continues to have some left-sided breast discomfort but better compared to time of admission.  Assessment and plan:  Sepsis POA from left base cellulitis/suspected drain site infection, port site infection, POA Left breast cancer s/p lumpectomy, targeted/sentinel node 10/7 and then  completion ALND 10/29 Cellulitis of the left breast Hematoma on the left breast Recurrent fever Staph epidermidis in 1 set-likely contamination: Recent left axillary lymphadenopathy on 09/14/2024 with drain, s/p recent port site removal. She had fever, and despite being on antibiotics left breast were red and tender Blood cx with Staph epidermidis?  Likely Contamination as 1/2 set. Dr Dorene ob-given lack of improvement, patient was taken to the OR for left axilla washout on 09/28/24.  Initial drain removed 11/10 as drain not communicating w/ hematoma.  New drain in place, however does not have much output. Cont on  cefepime,Zyvox and Flagyl . Cont pain control with oral Oxy, tylenol , did not tolerate morphine ,. surgery recommends continuing antibiotics for now, will follow culture results from the surgery.  Could consider ID consultation or input for antibiotics and duration upon discharge.  Patient without leukocytosis.  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 Odean  Anemia likely multifactorial from acute illness and malignancy: Hemoglobin stable.  Monitor   Class I Obesity w/ Body mass index is 34.4 kg/m.: Will benefit with PCP follow-up, weight loss,healthy lifestyle  Generalized weakness:  Ambulatory at baseline  DVT prophylaxis: SCDs Start: 09/24/24 2245 refusing pork derivatives including  Lovenox and heparin . Code Status:   Code Status: Full Code Family Communication: plan of care discussed  with patient. Patient status is: Remains hospitalized because of severity of illness Level of care: Progressive   Dispo: The patient is from:Home            Anticipated disposition: TBD Objective: Vitals last 24 hrs: Vitals:   09/28/24 1719 09/29/24 0025 09/29/24 0543 09/29/24 1309  BP: 137/72 107/67 115/76 (!) 100/59  Pulse: 84 76 79 89  Resp: 16 16 18 18   Temp: 98.2 F (36.8 C) 98 F (36.7 C) 98.1 F (36.7 C) 98.3 F (36.8 C)  TempSrc: Oral   Oral  SpO2: 96% 96% 98% 96%  Weight:      Height:        Physical Examination: General exam: AAOX3 HEENT:Oral mucosa moist, Ear/Nose WNL grossly Respiratory system: CTA B/L. Cardiovascular system: S1 & S2 +, No JVD. Gastrointestinal system: Abdomen soft,NT,ND, BS+ Nervous System: Alert, awake, moving all extremities,and following commands. Extremities: extremities warm, no peripheral edema  skin: Warm, no rashes, dressing in place left axilla MSK: Normal muscle bulk,tone, power   Medications reviewed:  Scheduled Meds: Continuous Infusions:  ceFEPime (MAXIPIME) IV 2 g (09/29/24 1240)   linezolid (ZYVOX) IV 600 mg (09/29/24 1111)   metronidazole  500 mg (09/29/24 0911)   Diet: Diet Order             Diet regular Room service appropriate? Yes; Fluid consistency: Thin  Diet effective now                   Data Reviewed: I have personally reviewed following labs and imaging studies ( see epic result tab) CBC: Recent Labs  Lab 09/24/24 2012 09/25/24 0819 09/26/24 0414 09/27/24 0424 09/28/24 0410 09/29/24 0417  WBC 15.0* 14.9* 13.6* 10.9* 11.0* 7.2  NEUTROABS 11.9* 11.9*  --   --   --   --   HGB 13.0 11.6* 10.8* 11.0* 11.2* 11.5*  HCT 41.7 37.8 34.3* 34.5* 34.3* 36.1  MCV 79.0* 80.6 80.7 79.9* 78.1* 78.1*  PLT 382 300 312 385 404* 439*   CMP: Recent Labs  Lab 09/25/24 0819 09/26/24 0414 09/27/24 0424 09/28/24 0410 09/29/24 0417  NA 138 135 139 134* 136  K 4.0 3.6 3.8 3.9 4.1  CL 105 101 103 100 101  CO2  26 26 26 22 25   GLUCOSE 93 116* 89 94 135*  BUN 7 6 6 8 12   CREATININE 0.57 0.59 0.54 0.51 0.48  CALCIUM 8.3* 8.9 9.0 8.7* 9.1  MG 2.0  --   --   --   --   PHOS 3.3  --   --   --   --    GFR: Estimated Creatinine Clearance: 79.6 mL/min (by C-G formula based on SCr of 0.48 mg/dL). Recent Labs  Lab 09/24/24 2012  AST 23  ALT 35  ALKPHOS 92  BILITOT 0.2  PROT 7.6  ALBUMIN 4.2   No results for input(s): LIPASE, AMYLASE in the last 168 hours. No results for input(s): AMMONIA in the last 168 hours. Coagulation Profile:  Recent Labs  Lab 09/24/24 2012  INR 1.1   Unresulted Labs (From admission, onward)     Start     Ordered   09/26/24 0500  CBC  Daily,   R      09/25/24 0828   09/26/24 0500  Basic metabolic panel with GFR  Daily,   R      09/25/24 9171  Antimicrobials/Microbiology: Anti-infectives (From admission, onward)    Start     Dose/Rate Route Frequency Ordered Stop   09/25/24 1000  linezolid (ZYVOX) IVPB 600 mg        600 mg 300 mL/hr over 60 Minutes Intravenous Every 12 hours 09/25/24 0357     09/25/24 0800  metroNIDAZOLE  (FLAGYL ) IVPB 500 mg        500 mg 100 mL/hr over 60 Minutes Intravenous Every 12 hours 09/25/24 0357     09/25/24 0400  ceFEPIme (MAXIPIME) 2 g in sodium chloride  0.9 % 100 mL IVPB        2 g 200 mL/hr over 30 Minutes Intravenous Every 8 hours 09/25/24 0357     09/24/24 2000  ceFEPIme (MAXIPIME) 2 g in sodium chloride  0.9 % 100 mL IVPB        2 g 200 mL/hr over 30 Minutes Intravenous  Once 09/24/24 1953 09/24/24 2108   09/24/24 2000  metroNIDAZOLE  (FLAGYL ) IVPB 500 mg        500 mg 100 mL/hr over 60 Minutes Intravenous  Once 09/24/24 1953 09/24/24 2205   09/24/24 2000  vancomycin (VANCOCIN) IVPB 1000 mg/200 mL premix        1,000 mg 200 mL/hr over 60 Minutes Intravenous  Once 09/24/24 1953 09/24/24 2324         Component Value Date/Time   SDES  09/28/2024 1524    ABSCESS Performed at [facility],  [address], [place], [place] [address]    SPECREQUEST  09/28/2024 1524    L BREAST SEROMA Performed at [facility], [address], [place], [place] [address]    CULT  09/28/2024 1524    NO GROWTH < 12 HOURS Performed at [facility], [address], [place], [place] [address]    REPTSTATUS PENDING 09/28/2024 1524  Procedures:Procedure(s) (LRB): IRRIGATION AND DEBRIDEMENT HEMATOMA (Left)  Landon FORBES Baller, MD [facility] 09/30/2024, 9:20 AM

## 2024-09-30 NOTE — Progress Notes (Signed)
 2 Days Post-Op   Subjective/Chief Complaint: Improving.  ? More swelling in axilla.    Objective: Vital signs in last 24 hours: Temp:  [98 F (36.7 C)-98.5 F (36.9 C)] 98.4 F (36.9 C) (11/14 1433) Pulse Rate:  [73-82] 73 (11/14 1433) Resp:  [17-18] 17 (11/14 1433) BP: (116-122)/(69-75) 122/69 (11/14 1433) SpO2:  [98 %-100 %] 98 % (11/14 1433) Last BM Date : 09/24/24  Intake/Output from previous day: 11/13 0701 - 11/14 0700 In: 1320 [P.O.:720; IV Piggyback:600] Out: 1300 [Urine:1300] Intake/Output this shift: Total I/O In: 240 [P.O.:240] Out: 0   General:  looks uncomfortable Improved cellulitis.  Drain with no output, but holding suction.  Drain stripped.    Lab Results:  Recent Labs    09/29/24 0417 09/30/24 1125  WBC 7.2 6.7  HGB 11.5* 11.6*  HCT 36.1 36.7  PLT 439* 446*   BMET Recent Labs    09/29/24 0417 09/30/24 1125  NA 136 139  K 4.1 3.8  CL 101 103  CO2 25 24  GLUCOSE 135* 85  BUN 12 9  CREATININE 0.48 0.51  CALCIUM 9.1 9.3   PT/INR No results for input(s): LABPROT, INR in the last 72 hours.  ABG No results for input(s): PHART, HCO3 in the last 72 hours.  Invalid input(s): PCO2, PO2  Studies/Results: No results found.   Anti-infectives: Anti-infectives (From admission, onward)    Start     Dose/Rate Route Frequency Ordered Stop   09/25/24 1000  linezolid (ZYVOX) IVPB 600 mg        600 mg 300 mL/hr over 60 Minutes Intravenous Every 12 hours 09/25/24 0357     09/25/24 0800  metroNIDAZOLE  (FLAGYL ) IVPB 500 mg  Status:  Discontinued        500 mg 100 mL/hr over 60 Minutes Intravenous Every 12 hours 09/25/24 0357 09/30/24 1345   09/25/24 0400  ceFEPIme (MAXIPIME) 2 g in sodium chloride  0.9 % 100 mL IVPB  Status:  Discontinued        2 g 200 mL/hr over 30 Minutes Intravenous Every 8 hours 09/25/24 0357 09/30/24 1345   09/24/24 2000  ceFEPIme (MAXIPIME) 2 g in sodium chloride  0.9 % 100 mL IVPB        2 g 200 mL/hr over 30  Minutes Intravenous  Once 09/24/24 1953 09/24/24 2108   09/24/24 2000  metroNIDAZOLE  (FLAGYL ) IVPB 500 mg        500 mg 100 mL/hr over 60 Minutes Intravenous  Once 09/24/24 1953 09/24/24 2205   09/24/24 2000  vancomycin (VANCOCIN) IVPB 1000 mg/200 mL premix        1,000 mg 200 mL/hr over 60 Minutes Intravenous  Once 09/24/24 1953 09/24/24 2324       Assessment/Plan: Left breast cancer, s/p lumpectomy, targeted/sentinel node 08/23/2028, then completion ALND 09/14/2024. Takeback for washout of axilla 11/12.   Cellulitis Hematoma.  Fever  Continue antibiotics Await cultures from OR. Staph from axilla pending sensitivities.  ID consult.  Ultrasound to eval axilla to see if drain clogged and fluid reacumulating.   Continue IV pain meds Pull drain tomorrow if no output.  Care complicated by lack of social support as husband unable to take off to help her at home.      LOS: 6 days    Jina LITTIE Nephew, MD, FACS, FSSO Surgical Oncology, General Surgery, [facility], [place] [phone] for weekday/non holidays Check [email or web address] for coverage night/weekend/holidays under General Surgery

## 2024-10-01 ENCOUNTER — Encounter (HOSPITAL_COMMUNITY): Payer: Self-pay

## 2024-10-01 DIAGNOSIS — R6520 Severe sepsis without septic shock: Secondary | ICD-10-CM | POA: Diagnosis not present

## 2024-10-01 DIAGNOSIS — A419 Sepsis, unspecified organism: Secondary | ICD-10-CM | POA: Diagnosis not present

## 2024-10-01 DIAGNOSIS — L03112 Cellulitis of left axilla: Secondary | ICD-10-CM

## 2024-10-01 MED ORDER — LINEZOLID 600 MG PO TABS
600.0000 mg | ORAL_TABLET | Freq: Two times a day (BID) | ORAL | Status: DC
  Administered 2024-10-01 – 2024-10-04 (×7): 600 mg via ORAL
  Filled 2024-10-01 (×7): qty 1

## 2024-10-01 NOTE — Progress Notes (Signed)
 3 Days Post-Op   Subjective/Chief Complaint: Patient is very tearful and emotional Worried about the increased output from the drain and the brownish appearance. Her husband is also calling the answering service while I was in the patient's room, although he is not present at the bedside.   Objective: Vital signs in last 24 hours: Temp:  [98.4 F (36.9 C)-98.8 F (37.1 C)] 98.8 F (37.1 C) (11/15 0449) Pulse Rate:  [73-84] 82 (11/15 0449) Resp:  [17-18] 18 (11/15 0449) BP: (107-122)/(62-69) 107/62 (11/15 0449) SpO2:  [97 %-99 %] 97 % (11/15 0449) Last BM Date : 09/24/24  Intake/Output from previous day: 11/14 0701 - 11/15 0700 In: 877.4 [P.O.:720; IV Piggyback:157.4] Out: 510 [Urine:450; Drains:60] Intake/Output this shift: Total I/O In: -  Out: 5 [Drains:5]  Tearful, reports some pain near the left shoulder/ axilla No erythema around incision Drain holding suction; dark brown output - 60 cc output  Lab Results:  Recent Labs    09/29/24 0417 09/30/24 1125  WBC 7.2 6.7  HGB 11.5* 11.6*  HCT 36.1 36.7  PLT 439* 446*   BMET Recent Labs    09/29/24 0417 09/30/24 1125  NA 136 139  K 4.1 3.8  CL 101 103  CO2 25 24  GLUCOSE 135* 85  BUN 12 9  CREATININE 0.48 0.51  CALCIUM 9.1 9.3    Studies/Results: US  AXILLA LEFT Result Date: 09/30/2024 EXAM: US  LEFT UPPER EXTREMITY NONVASCULAR SOFT TISSUE 09/30/2024 07:25:36 PM TECHNIQUE: Real-time ultrasound scan of the left upper extremity with image documentation. COMPARISON: None available. CLINICAL HISTORY: Swelling in left armpit. FINDINGS: SOFT TISSUES: A complex multiloculated fluid collection is seen within the left axilla measuring 6.2 x 2.8 x 8.0 cm demonstrating multiple internal septa and debris. No significant surrounding hyperemia. The superior most and inferior most extent of the collection is not fully included on this examination, however, the collection appears entirely located within the subcutaneous soft  tissues. IMPRESSION: 1. Complex multiloculated fluid collection within the left axilla measuring 6.2 x 2.8 x 8.0 cm, with multiple internal septa and debris, located within the subcutaneous soft tissues. No significant surrounding hyperemia. 2. The superior and inferior extents of the collection are not fully included on this examination. Electronically signed by: Dorethia Molt MD 09/30/2024 09:35 PM EST RP Workstation: [id number]    Anti-infectives: Anti-infectives (From admission, onward)    Start     Dose/Rate Route Frequency Ordered Stop   09/25/24 1000  linezolid (ZYVOX) IVPB 600 mg        600 mg 300 mL/hr over 60 Minutes Intravenous Every 12 hours 09/25/24 0357     09/25/24 0800  metroNIDAZOLE  (FLAGYL ) IVPB 500 mg  Status:  Discontinued        500 mg 100 mL/hr over 60 Minutes Intravenous Every 12 hours 09/25/24 0357 09/30/24 1345   09/25/24 0400  ceFEPIme (MAXIPIME) 2 g in sodium chloride  0.9 % 100 mL IVPB  Status:  Discontinued        2 g 200 mL/hr over 30 Minutes Intravenous Every 8 hours 09/25/24 0357 09/30/24 1345   09/24/24 2000  ceFEPIme (MAXIPIME) 2 g in sodium chloride  0.9 % 100 mL IVPB        2 g 200 mL/hr over 30 Minutes Intravenous  Once 09/24/24 1953 09/24/24 2108   09/24/24 2000  metroNIDAZOLE  (FLAGYL ) IVPB 500 mg        500 mg 100 mL/hr over 60 Minutes Intravenous  Once 09/24/24 1953 09/24/24 2205   09/24/24 2000  vancomycin (VANCOCIN) IVPB 1000 mg/200 mL premix        1,000 mg 200 mL/hr over 60 Minutes Intravenous  Once 09/24/24 1953 09/24/24 2324       Assessment/Plan: Left breast cancer, s/p lumpectomy, targeted/sentinel node 08/23/2028, then completion ALND 09/14/2024. Takeback for washout of axilla 11/12.  Surgicel Powder used for hemostasis   Cellulitis - improved Hematoma.  Fever   Continue antibiotics Await cultures from OR. Staph from axilla pending sensitivities.  ID consult.  Ultrasound showed residual fluid collection - but drain is now putting  out more output.  Dark color of drainage is likely from surgicel powder.  No sign of purulent drainage   Continue IV pain meds Remains on Zyvox. Care complicated by lack of social support as husband unable to take off to help her at home.  Dr. Aron is assigned to [facility] for the entire week next week.  If patient is to remain hospitalized due to lack of social support, consider transfer to [facility] so her surgeon can be available to see her daily.  LOS: 7 days    Donnice MARLA Lima 10/01/2024

## 2024-10-01 NOTE — Progress Notes (Signed)
 PROGRESS NOTE    [name]  FMW:[id number] DOB: August 01, 1976 DOA: 09/24/2024 PCP: [facility]   Brief Narrative:  Wanda Garcia is a 48 y.o. female with PMH of  for recently diagnosed left breast cancer 03/22/2024 status post chemotherapy (last chemotherapy was about 2 months ago-was stopped prematurely due to chemo induced neuropathy), anxiety, status post left axillary lymphadenopathy on 09/14/2024 with drain, status post port site removal, who presents to the ER sent from urgent care with concern for possible port site infection, generalized weakness and fatigue, nausea, and vomiting.   Assessment & Plan:   Principal Problem:   Sepsis (HCC)   Severe sepsis POA from left base cellulitis/suspected drain site infection, port site infection, POA Left breast cancer s/p lumpectomy, targeted/sentinel node 10/7 and then completion ALND 10/29 Hematoma on the left breast Rule out bacteremia - Recent left axillary lymphadenopathy on 09/14/2024 with drain, s/p recent port site removal. -Sepsis criteria met with fever tachycardia and notable source of cellulitis, infected drain and questionable bacteremia with Staph epidermidis x 1  -Lactic acidosis noted at intake meeting criteria for severe sepsis - Bacteremia possibly false positive(staph epi x 1), await repeat/culture finalization - Left axillary washout 09/28/2024 with surgery Dr. 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 Odean   Anemia likely multifactorial from acute illness and malignancy: Hemoglobin stable.  Monitor   Class I Obesity w/ Body mass index is 34.4 kg/m.: Will benefit with PCP follow-up, weight loss,healthy lifestyle  Tobacco use disorder Cessation counseling   DVT prophylaxis: SCDs Start: 09/24/24 2245 Code Status:   Code Status: Full Code Family Communication: None present  Status is: Inpatient Dispo: The patient is from: Home              Anticipated d/c is to: Home              Anticipated d/c date is: 48 to 72 hours pending above              Patient currently not medically stable for discharge  Consultants:  General Surgery  Procedures:  Drain removal and placement as above, I&D left axillary hematoma  Antimicrobials:  Linezolid  Subjective: No acute issues or events overnight, patient continues to refuse linezolid this morning  Objective: Vitals:   09/30/24 0612 09/30/24 1433 09/30/24 2119 10/01/24 0449  BP: 118/75 122/69 121/65 107/62  Pulse: 82 73 84 82  Resp: 18 17 17 18   Temp: 98.5 F (36.9 C) 98.4 F (36.9 C) 98.7 F (37.1 C) 98.8 F (37.1 C)  TempSrc: Oral Oral Oral Oral  SpO2: 98% 98% 99% 97%  Weight:      Height:        Intake/Output Summary (Last 24 hours) at 10/01/2024 0746 Last data filed at 10/01/2024 0735 Gross per 24 hour  Intake 877.44 ml  Output 515 ml  Net 362.44 ml   Filed Weights   09/25/24 0214 09/28/24 1311  Weight: 79.9 kg 79.9 kg    Examination:  General:  Pleasantly resting in bed, No acute distress. HEENT:  Normocephalic atraumatic.  Sclerae nonicteric, noninjected.  Extraocular movements intact bilaterally. Neck:  Without mass or deformity.  Trachea is midline. Lungs:  Clear to auscultate bilaterally without rhonchi, wheeze, or rales. Heart:  Regular rate and rhythm.  Without murmurs, rubs, or gallops. Abdomen:  Soft, nontender, nondistended.  Without guarding or rebound. Extremities: Without cyanosis, clubbing,  edema, or obvious deformity. Skin: Left axillary dressing clean dry intact  Data Reviewed: I have personally reviewed following labs and imaging studies  CBC: Recent Labs  Lab 09/24/24 2012 09/25/24 0819 09/26/24 0414 09/27/24 0424 09/28/24 0410 09/29/24 0417 09/30/24 1125  WBC 15.0* 14.9* 13.6* 10.9* 11.0* 7.2 6.7  NEUTROABS 11.9* 11.9*  --   --   --   --   --   HGB 13.0 11.6* 10.8* 11.0* 11.2* 11.5* 11.6*  HCT 41.7 37.8 34.3* 34.5* 34.3* 36.1 36.7  MCV 79.0* 80.6 80.7 79.9* 78.1* 78.1* 78.9*  PLT 382 300 312 385 404* 439* 446*   Basic Metabolic Panel: Recent Labs  Lab 09/25/24 0819 09/26/24 0414 09/27/24 0424 09/28/24 0410 09/29/24 0417 09/30/24 1125  NA 138 135 139 134* 136 139  K 4.0 3.6 3.8 3.9 4.1 3.8  CL 105 101 103 100 101 103  CO2 26 26 26 22 25 24   GLUCOSE 93 116* 89 94 135* 85  BUN 7 6 6 8 12 9   CREATININE 0.57 0.59 0.54 0.51 0.48 0.51  CALCIUM 8.3* 8.9 9.0 8.7* 9.1 9.3  MG 2.0  --   --   --   --   --   PHOS 3.3  --   --   --   --   --    GFR: Estimated Creatinine Clearance: 79.6 mL/min (by C-G formula based on SCr of 0.51 mg/dL). Liver Function Tests: Recent Labs  Lab 09/24/24 2012  AST 23  ALT 35  ALKPHOS 92  BILITOT 0.2  PROT 7.6  ALBUMIN 4.2   No results for input(s): LIPASE, AMYLASE in the last 168 hours. No results for input(s): AMMONIA in the last 168 hours. Coagulation Profile: Recent Labs  Lab 09/24/24 2012  INR 1.1   Cardiac Enzymes: No results for input(s): CKTOTAL, CKMB, CKMBINDEX, TROPONINI in the last 168 hours. BNP (last 3 results) No results for input(s): PROBNP in the last 8760 hours. HbA1C: No results for input(s): HGBA1C in the last 72 hours. CBG: No results for input(s): GLUCAP in the last 168 hours. Lipid Profile: No results for input(s): CHOL, HDL, LDLCALC, TRIG, CHOLHDL, LDLDIRECT in the last 72 hours. Thyroid Function Tests: No results for input(s): TSH, T4TOTAL, FREET4,  T3FREE, THYROIDAB in the last 72 hours. Anemia Panel: No results for input(s): VITAMINB12, FOLATE, FERRITIN, TIBC, IRON, RETICCTPCT in the last 72 hours. Sepsis Labs: Recent Labs  Lab 09/24/24 2021 09/24/24 2212  LATICACIDVEN 2.0* 1.3    Recent Results (from the past 240 hours)  Blood Culture (routine x 2)     Status: Abnormal   Collection Time: 09/24/24  8:12 PM   Specimen: BLOOD  Result Value Ref Range Status   Specimen Description   Final    BLOOD RIGHT ANTECUBITAL Performed at [facility], [address], [place], [place] [address]    Special Requests   Final    BOTTLES DRAWN AEROBIC AND ANAEROBIC Blood Culture results may not be optimal due to an inadequate volume of blood received in culture bottles Performed at [facility], [address], [place], [place] [address]  Culture  Setup Time   Final    GRAM POSITIVE COCCI AEROBIC BOTTLE ONLY RBV MITCHELL PHARMD 09/26/2024 0030 BY DD    Culture (A)  Final    STAPHYLOCOCCUS EPIDERMIDIS THE SIGNIFICANCE OF ISOLATING THIS ORGANISM FROM A SINGLE SET OF BLOOD CULTURES WHEN MULTIPLE SETS ARE DRAWN IS UNCERTAIN. PLEASE NOTIFY THE MICROBIOLOGY DEPARTMENT WITHIN ONE WEEK IF SPECIATION AND SENSITIVITIES ARE REQUIRED. Performed at [facility], [address], [place], [place] [address]    Report Status 09/27/2024 FINAL  Final  Blood Culture (routine x 2)     Status: None   Collection Time: 09/24/24  8:12 PM   Specimen: BLOOD  Result Value Ref Range Status   Specimen Description   Final    BLOOD RIGHT ANTECUBITAL Performed at [facility], [address], [place], [place] [address]    Special Requests   Final    BOTTLES DRAWN AEROBIC AND ANAEROBIC Blood Culture results may not be optimal due to an inadequate volume of blood received in culture bottles Performed at [facility], [address] [place], [place] [address]     Culture   Final    NO GROWTH 5 DAYS Performed at [facility], [address] [place], [place] [address]    Report Status 09/29/2024 FINAL  Final  Blood Culture ID Panel (Reflexed)     Status: Abnormal   Collection Time: 09/24/24  8:12 PM  Result Value Ref Range Status   Enterococcus faecalis NOT DETECTED NOT DETECTED Final   Enterococcus Faecium NOT DETECTED NOT DETECTED Final   Listeria monocytogenes NOT DETECTED NOT DETECTED Final   Staphylococcus species DETECTED (A) NOT DETECTED Final    Comment: CRITICAL RESULT CALLED TO, READ BACK BY AND VERIFIED WITH: RBV MITCHELL PHARMD 09/26/2024 0030 BY DD    Staphylococcus aureus (BCID) NOT DETECTED NOT DETECTED Final   Staphylococcus epidermidis DETECTED (A) NOT DETECTED Final    Comment: CRITICAL RESULT CALLED TO, READ BACK BY AND VERIFIED WITH: RBV MITCHELL PHARMD 09/26/2024 0030 BY DD    Staphylococcus lugdunensis NOT DETECTED NOT DETECTED Final   Streptococcus species NOT DETECTED NOT DETECTED Final   Streptococcus agalactiae NOT DETECTED NOT DETECTED Final   Streptococcus pneumoniae NOT DETECTED NOT DETECTED Final   Streptococcus pyogenes NOT DETECTED NOT DETECTED Final   A.calcoaceticus-baumannii NOT DETECTED NOT DETECTED Final   Bacteroides fragilis NOT DETECTED NOT DETECTED Final   Enterobacterales NOT DETECTED NOT DETECTED Final   Enterobacter cloacae complex NOT DETECTED NOT DETECTED Final   Escherichia coli NOT DETECTED NOT DETECTED Final   Klebsiella aerogenes NOT DETECTED NOT DETECTED Final   Klebsiella oxytoca NOT DETECTED NOT DETECTED Final   Klebsiella pneumoniae NOT DETECTED NOT DETECTED Final   Proteus species NOT DETECTED NOT DETECTED Final   Salmonella species NOT DETECTED NOT DETECTED Final   Serratia marcescens NOT DETECTED NOT DETECTED Final   Haemophilus influenzae NOT DETECTED NOT DETECTED Final   Neisseria meningitidis NOT DETECTED NOT DETECTED Final   Pseudomonas aeruginosa NOT DETECTED NOT DETECTED  Final   Stenotrophomonas maltophilia NOT DETECTED NOT DETECTED Final   Candida albicans NOT DETECTED NOT DETECTED Final   Candida auris NOT DETECTED NOT DETECTED Final   Candida glabrata NOT DETECTED NOT DETECTED Final   Candida krusei NOT DETECTED NOT DETECTED Final   Candida parapsilosis NOT DETECTED NOT DETECTED Final   Candida tropicalis NOT DETECTED NOT DETECTED Final   Cryptococcus neoformans/gattii NOT DETECTED NOT DETECTED Final  Methicillin resistance mecA/C NOT DETECTED NOT DETECTED Final    Comment: Performed at [facility], [address] [place], [place] [address]  Resp panel by RT-PCR (RSV, Flu A&B, Covid) Anterior Nasal Swab     Status: None   Collection Time: 09/24/24  8:44 PM   Specimen: Anterior Nasal Swab  Result Value Ref Range Status   SARS Coronavirus 2 by RT PCR NEGATIVE NEGATIVE Final    Comment: (NOTE) SARS-CoV-2 target nucleic acids are NOT DETECTED.  The SARS-CoV-2 RNA is generally detectable in upper respiratory specimens during the acute phase of infection. The lowest concentration of SARS-CoV-2 viral copies this assay can detect is 138 copies/mL. A negative result does not preclude SARS-Cov-2 infection and should not be used as the sole basis for treatment or other patient management decisions. A negative result may occur with  improper specimen collection/handling, submission of specimen other than nasopharyngeal swab, presence of viral mutation(s) within the areas targeted by this assay, and inadequate number of viral copies(<138 copies/mL). A negative result must be combined with clinical observations, patient history, and epidemiological information. The expected result is Negative.  Fact Sheet for Patients:  bloggercourse.com  Fact Sheet for Healthcare Providers:  seriousbroker.it  This test is no t yet approved or cleared by the [place]  FDA and  has been authorized for detection  and/or diagnosis of SARS-CoV-2 by FDA under an Emergency Use Authorization (EUA). This EUA will remain  in effect (meaning this test can be used) for the duration of the COVID-19 declaration under Section 564(b)(1) of the Act, 21 U.S.C.section 360bbb-3(b)(1), unless the authorization is terminated  or revoked sooner.       Influenza A by PCR NEGATIVE NEGATIVE Final   Influenza B by PCR NEGATIVE NEGATIVE Final    Comment: (NOTE) The Xpert Xpress SARS-CoV-2/FLU/RSV plus assay is intended as an aid in the diagnosis of influenza from Nasopharyngeal swab specimens and should not be used as a sole basis for treatment. Nasal washings and aspirates are unacceptable for Xpert Xpress SARS-CoV-2/FLU/RSV testing.  Fact Sheet for Patients: bloggercourse.com  Fact Sheet for Healthcare Providers: seriousbroker.it  This test is not yet approved or cleared by the [place]  FDA and has been authorized for detection and/or diagnosis of SARS-CoV-2 by FDA under an Emergency Use Authorization (EUA). This EUA will remain in effect (meaning this test can be used) for the duration of the COVID-19 declaration under Section 564(b)(1) of the Act, 21 U.S.C. section 360bbb-3(b)(1), unless the authorization is terminated or revoked.     Resp Syncytial Virus by PCR NEGATIVE NEGATIVE Final    Comment: (NOTE) Fact Sheet for Patients: bloggercourse.com  Fact Sheet for Healthcare Providers: seriousbroker.it  This test is not yet approved or cleared by the [place]  FDA and has been authorized for detection and/or diagnosis of SARS-CoV-2 by FDA under an Emergency Use Authorization (EUA). This EUA will remain in effect (meaning this test can be used) for the duration of the COVID-19 declaration under Section 564(b)(1) of the Act, 21 U.S.C. section 360bbb-3(b)(1), unless the authorization is terminated  or revoked.  Performed at [facility], [address], [place], [place] [address]   Aerobic/Anaerobic Culture w Gram Stain (surgical/deep wound)     Status: None (Preliminary result)   Collection Time: 09/28/24  3:21 PM   Specimen: Arm, Left; Abscess  Result Value Ref Range Status   Specimen Description   Final    ABSCESS Performed at [facility], [address] [place], [place] [address]    Special Requests   Final    LITTIE BIENENSTOCK Performed at [facility], [address], [place], [place] [address]    Gram Stain   Final    NO WBC SEEN NO ORGANISMS SEEN Performed at [facility], [address] [place], [place] [address]    Culture   Final    MODERATE STAPHYLOCOCCUS AUREUS SUSCEPTIBILITIES TO FOLLOW NO ANAEROBES ISOLATED; CULTURE IN PROGRESS FOR 5 DAYS    Report Status PENDING  Incomplete  Aerobic/Anaerobic Culture w Gram Stain (surgical/deep wound)     Status: None (Preliminary result)   Collection Time: 09/28/24  3:24 PM   Specimen: Arm, Left; Abscess  Result Value Ref Range Status   Specimen Description   Final    ABSCESS Performed at [facility], [address], [place], [place] [address]    Special Requests   Final    L BREAST SEROMA Performed at [facility], [address] [place], [place] [address]    Gram Stain   Final    RARE WBC PRESENT, PREDOMINANTLY MONONUCLEAR NO ORGANISMS SEEN    Culture   Final    NO GROWTH 2 DAYS Performed at [facility], [address], [place], [place] [address]    Report Status PENDING  Incomplete         Radiology Studies: US  AXILLA LEFT Result Date: 09/30/2024 EXAM: US  LEFT UPPER EXTREMITY NONVASCULAR SOFT TISSUE 09/30/2024 07:25:36 PM TECHNIQUE: Real-time ultrasound scan of the left upper extremity with image documentation. COMPARISON: None available. CLINICAL HISTORY: Swelling in left armpit. FINDINGS: SOFT  TISSUES: A complex multiloculated fluid collection is seen within the left axilla measuring 6.2 x 2.8 x 8.0 cm demonstrating multiple internal septa and debris. No significant surrounding hyperemia. The superior most and inferior most extent of the collection is not fully included on this examination, however, the collection appears entirely located within the subcutaneous soft tissues. IMPRESSION: 1. Complex multiloculated fluid collection within the left axilla measuring 6.2 x 2.8 x 8.0 cm, with multiple internal septa and debris, located within the subcutaneous soft tissues. No significant surrounding hyperemia. 2. The superior and inferior extents of the collection are not fully included on this examination. Electronically signed by: Dorethia Molt MD 09/30/2024 09:35 PM EST RP Workstation: [id number]   Scheduled Meds: Continuous Infusions:  linezolid (ZYVOX) IV Stopped (09/30/24 1030)     LOS: 7 days   Time spent:  Elsie JAYSON Montclair, DO [facility]  If 7PM-7AM, please contact night-coverage [email or web address]  10/01/2024, 7:46 AM

## 2024-10-01 NOTE — Progress Notes (Signed)
 This pt complains of left side pain of under axiliary due to tight breast binder. Asked second nurse to verify and pt agreed to take it off. Also gave pain medication and will follow up checking the area.

## 2024-10-02 DIAGNOSIS — L03112 Cellulitis of left axilla: Secondary | ICD-10-CM | POA: Diagnosis not present

## 2024-10-02 DIAGNOSIS — R6520 Severe sepsis without septic shock: Secondary | ICD-10-CM | POA: Diagnosis not present

## 2024-10-02 DIAGNOSIS — A419 Sepsis, unspecified organism: Secondary | ICD-10-CM | POA: Diagnosis not present

## 2024-10-02 NOTE — Progress Notes (Signed)
 Patient is on floor from [employer], alert/orient. Pain is 5/10. Refusing and IV. Has a heavy accent-Arabic. JP drain in place was compressed.

## 2024-10-02 NOTE — Plan of Care (Signed)

## 2024-10-02 NOTE — Progress Notes (Signed)
 PROGRESS NOTE    Wanda Garcia  FMW:[id number] DOB: 28-Feb-1976 DOA: 09/24/2024 PCP: [facility]   Brief Narrative:  Wanda Garcia is a 48 y.o. female with PMH of  for recently diagnosed left breast cancer 03/22/2024 status post chemotherapy (last chemotherapy was about 2 months ago-was stopped prematurely due to chemo induced neuropathy), anxiety, status post left axillary lymphadenopathy on 09/14/2024 with drain, status post port site removal, who presents to the ER sent from urgent care with concern for possible port site infection, generalized weakness and fatigue, nausea, and vomiting.   Assessment & Plan:   Principal Problem:   Sepsis (HCC)   Severe sepsis POA from left base cellulitis/suspected drain site infection, port site infection, POA Left breast cancer s/p lumpectomy, targeted/sentinel node 10/7 and then completion ALND 10/29 Hematoma on the left breast Rule out bacteremia - Recent left axillary lymphadenopathy on 09/14/2024 with drain, s/p recent port site removal. -Sepsis criteria met with fever tachycardia and notable source of cellulitis, infected drain and questionable bacteremia with Staph epidermidis x 1  -Lactic acidosis noted at intake meeting criteria for severe sepsis - Bacteremia possibly false positive(staph epi x 1), await repeat/culture finalization - Left axillary washout 09/28/2024 with surgery Dr. Aron  - Drain removed 11/10 due to decreased output, not communicating with hematoma -new drain placed at that time - IV Linezolid ongoing but patient apparently refusing over the past 48 hours - converted to PO linezolid in the interim -Continue as needed acetaminophen , hydromorphone , oxycodone    Suspected red man syndrome after IV vancomycin infusion IV vancomycin has been discontinued, on linezolid but refusing as above    Left breast cancer status postchemotherapy Chemo-induced polyneuropathy Chemotherapy was interrupted, about 2  months ago, due to chemo induced polyneuropathy.F/b Dr. Odean   Anemia likely multifactorial from acute illness and malignancy: Hemoglobin stable.  Monitor   Class I Obesity w/ Body mass index is 34.4 kg/m.: Will benefit with PCP follow-up, weight loss,healthy lifestyle  Tobacco use disorder Cessation counseling   DVT prophylaxis: SCDs Start: 09/24/24 2245 Code Status:   Code Status: Full Code Family Communication: None present  Status is: Inpatient Dispo: The patient is from: Home              Anticipated d/c is to: Home              Anticipated d/c date is: 48 to 72 hours pending above              Patient currently not medically stable for discharge  Consultants:  General Surgery  Procedures:  Drain removal and placement as above, I&D left axillary hematoma  Antimicrobials:  Linezolid  Subjective: No acute issues or events overnight, denies nausea vomiting diarrhea constipation headache fevers chills or chest pain  Objective: Vitals:   10/01/24 2058 10/02/24 0000 10/02/24 0400 10/02/24 0515  BP: 123/80   109/62  Pulse: 71   86  Resp: 18 20 (!) 23 18  Temp: 98.5 F (36.9 C)   98.3 F (36.8 C)  TempSrc: Oral   Oral  SpO2: 97%   97%  Weight:      Height:        Intake/Output Summary (Last 24 hours) at 10/02/2024 0744 Last data filed at 10/02/2024 0648 Gross per 24 hour  Intake 477 ml  Output 75 ml  Net 402 ml   Filed Weights   09/25/24 0214 09/28/24 1311  Weight: 79.9 kg 79.9 kg    Examination:  General:  Pleasantly  resting in bed, No acute distress. HEENT:  Normocephalic atraumatic.  Sclerae nonicteric, noninjected.  Extraocular movements intact bilaterally. Neck:  Without mass or deformity.  Trachea is midline. Lungs:  Clear to auscultate bilaterally without rhonchi, wheeze, or rales. Heart:  Regular rate and rhythm.  Without murmurs, rubs, or gallops. Abdomen:  Soft, nontender, nondistended.  Without guarding or rebound. Extremities: Without  cyanosis, clubbing, edema, or obvious deformity. Skin: Left axillary dressing clean dry intact  Data Reviewed: I have personally reviewed following labs and imaging studies  CBC: Recent Labs  Lab 09/25/24 0819 09/26/24 0414 09/27/24 0424 09/28/24 0410 09/29/24 0417 09/30/24 1125  WBC 14.9* 13.6* 10.9* 11.0* 7.2 6.7  NEUTROABS 11.9*  --   --   --   --   --   HGB 11.6* 10.8* 11.0* 11.2* 11.5* 11.6*  HCT 37.8 34.3* 34.5* 34.3* 36.1 36.7  MCV 80.6 80.7 79.9* 78.1* 78.1* 78.9*  PLT 300 312 385 404* 439* 446*   Basic Metabolic Panel: Recent Labs  Lab 09/25/24 0819 09/26/24 0414 09/27/24 0424 09/28/24 0410 09/29/24 0417 09/30/24 1125  NA 138 135 139 134* 136 139  K 4.0 3.6 3.8 3.9 4.1 3.8  CL 105 101 103 100 101 103  CO2 26 26 26 22 25 24   GLUCOSE 93 116* 89 94 135* 85  BUN 7 6 6 8 12 9   CREATININE 0.57 0.59 0.54 0.51 0.48 0.51  CALCIUM 8.3* 8.9 9.0 8.7* 9.1 9.3  MG 2.0  --   --   --   --   --   PHOS 3.3  --   --   --   --   --    GFR: Estimated Creatinine Clearance: 79.6 mL/min (by C-G formula based on SCr of 0.51 mg/dL). Liver Function Tests: No results for input(s): AST, ALT, ALKPHOS, BILITOT, PROT, ALBUMIN in the last 168 hours.  No results for input(s): LIPASE, AMYLASE in the last 168 hours. No results for input(s): AMMONIA in the last 168 hours. Coagulation Profile: No results for input(s): INR, PROTIME in the last 168 hours.  Cardiac Enzymes: No results for input(s): CKTOTAL, CKMB, CKMBINDEX, TROPONINI in the last 168 hours. BNP (last 3 results) No results for input(s): PROBNP in the last 8760 hours. HbA1C: No results for input(s): HGBA1C in the last 72 hours. CBG: No results for input(s): GLUCAP in the last 168 hours. Lipid Profile: No results for input(s): CHOL, HDL, LDLCALC, TRIG, CHOLHDL, LDLDIRECT in the last 72 hours. Thyroid Function Tests: No results for input(s): TSH, T4TOTAL, FREET4,  T3FREE, THYROIDAB in the last 72 hours. Anemia Panel: No results for input(s): VITAMINB12, FOLATE, FERRITIN, TIBC, IRON, RETICCTPCT in the last 72 hours. Sepsis Labs: No results for input(s): PROCALCITON, LATICACIDVEN in the last 168 hours.   Recent Results (from the past 240 hours)  Blood Culture (routine x 2)     Status: Abnormal   Collection Time: 09/24/24  8:12 PM   Specimen: BLOOD  Result Value Ref Range Status   Specimen Description   Final    BLOOD RIGHT ANTECUBITAL Performed at [facility], [address] [place], [place] [address]    Special Requests   Final    BOTTLES DRAWN AEROBIC AND ANAEROBIC Blood Culture results may not be optimal due to an inadequate volume of blood received in culture bottles Performed at [facility], [address] [place], [place] [address]    Culture  Setup Time   Final    VONNE  POSITIVE COCCI AEROBIC BOTTLE ONLY RBV MITCHELL PHARMD 09/26/2024 0030 BY DD    Culture (A)  Final    STAPHYLOCOCCUS EPIDERMIDIS THE SIGNIFICANCE OF ISOLATING THIS ORGANISM FROM A SINGLE SET OF BLOOD CULTURES WHEN MULTIPLE SETS ARE DRAWN IS UNCERTAIN. PLEASE NOTIFY THE MICROBIOLOGY DEPARTMENT WITHIN ONE WEEK IF SPECIATION AND SENSITIVITIES ARE REQUIRED. Performed at [facility], [address], [place], [place] [address]    Report Status 09/27/2024 FINAL  Final  Blood Culture (routine x 2)     Status: None   Collection Time: 09/24/24  8:12 PM   Specimen: BLOOD  Result Value Ref Range Status   Specimen Description   Final    BLOOD RIGHT ANTECUBITAL Performed at [facility], [address] [place], [place] [address]    Special Requests   Final    BOTTLES DRAWN AEROBIC AND ANAEROBIC Blood Culture results may not be optimal due to an inadequate volume of blood received in culture bottles Performed at [facility], [address], [place], [place] [address]     Culture   Final    NO GROWTH 5 DAYS Performed at [facility], [address], [place], [place] [address]    Report Status 09/29/2024 FINAL  Final  Blood Culture ID Panel (Reflexed)     Status: Abnormal   Collection Time: 09/24/24  8:12 PM  Result Value Ref Range Status   Enterococcus faecalis NOT DETECTED NOT DETECTED Final   Enterococcus Faecium NOT DETECTED NOT DETECTED Final   Listeria monocytogenes NOT DETECTED NOT DETECTED Final   Staphylococcus species DETECTED (A) NOT DETECTED Final    Comment: CRITICAL RESULT CALLED TO, READ BACK BY AND VERIFIED WITH: RBV MITCHELL PHARMD 09/26/2024 0030 BY DD    Staphylococcus aureus (BCID) NOT DETECTED NOT DETECTED Final   Staphylococcus epidermidis DETECTED (A) NOT DETECTED Final    Comment: CRITICAL RESULT CALLED TO, READ BACK BY AND VERIFIED WITH: RBV MITCHELL PHARMD 09/26/2024 0030 BY DD    Staphylococcus lugdunensis NOT DETECTED NOT DETECTED Final   Streptococcus species NOT DETECTED NOT DETECTED Final   Streptococcus agalactiae NOT DETECTED NOT DETECTED Final   Streptococcus pneumoniae NOT DETECTED NOT DETECTED Final   Streptococcus pyogenes NOT DETECTED NOT DETECTED Final   A.calcoaceticus-baumannii NOT DETECTED NOT DETECTED Final   Bacteroides fragilis NOT DETECTED NOT DETECTED Final   Enterobacterales NOT DETECTED NOT DETECTED Final   Enterobacter cloacae complex NOT DETECTED NOT DETECTED Final   Escherichia coli NOT DETECTED NOT DETECTED Final   Klebsiella aerogenes NOT DETECTED NOT DETECTED Final   Klebsiella oxytoca NOT DETECTED NOT DETECTED Final   Klebsiella pneumoniae NOT DETECTED NOT DETECTED Final   Proteus species NOT DETECTED NOT DETECTED Final   Salmonella species NOT DETECTED NOT DETECTED Final   Serratia marcescens NOT DETECTED NOT DETECTED Final   Haemophilus influenzae NOT DETECTED NOT DETECTED Final   Neisseria meningitidis NOT DETECTED NOT DETECTED Final   Pseudomonas aeruginosa NOT DETECTED NOT DETECTED  Final   Stenotrophomonas maltophilia NOT DETECTED NOT DETECTED Final   Candida albicans NOT DETECTED NOT DETECTED Final   Candida auris NOT DETECTED NOT DETECTED Final   Candida glabrata NOT DETECTED NOT DETECTED Final   Candida krusei NOT DETECTED NOT DETECTED Final   Candida parapsilosis NOT DETECTED NOT DETECTED Final   Candida tropicalis NOT DETECTED NOT DETECTED Final   Cryptococcus neoformans/gattii NOT DETECTED NOT DETECTED Final   Methicillin resistance mecA/C NOT DETECTED NOT DETECTED Final  Comment: Performed at [facility], [address], [place], [place] [address]  Resp panel by RT-PCR (RSV, Flu A&B, Covid) Anterior Nasal Swab     Status: None   Collection Time: 09/24/24  8:44 PM   Specimen: Anterior Nasal Swab  Result Value Ref Range Status   SARS Coronavirus 2 by RT PCR NEGATIVE NEGATIVE Final    Comment: (NOTE) SARS-CoV-2 target nucleic acids are NOT DETECTED.  The SARS-CoV-2 RNA is generally detectable in upper respiratory specimens during the acute phase of infection. The lowest concentration of SARS-CoV-2 viral copies this assay can detect is 138 copies/mL. A negative result does not preclude SARS-Cov-2 infection and should not be used as the sole basis for treatment or other patient management decisions. A negative result may occur with  improper specimen collection/handling, submission of specimen other than nasopharyngeal swab, presence of viral mutation(s) within the areas targeted by this assay, and inadequate number of viral copies(<138 copies/mL). A negative result must be combined with clinical observations, patient history, and epidemiological information. The expected result is Negative.  Fact Sheet for Patients:  bloggercourse.com  Fact Sheet for Healthcare Providers:  seriousbroker.it  This test is no t yet approved or cleared by the [place]  FDA and  has been authorized for detection  and/or diagnosis of SARS-CoV-2 by FDA under an Emergency Use Authorization (EUA). This EUA will remain  in effect (meaning this test can be used) for the duration of the COVID-19 declaration under Section 564(b)(1) of the Act, 21 U.S.C.section 360bbb-3(b)(1), unless the authorization is terminated  or revoked sooner.       Influenza A by PCR NEGATIVE NEGATIVE Final   Influenza B by PCR NEGATIVE NEGATIVE Final    Comment: (NOTE) The Xpert Xpress SARS-CoV-2/FLU/RSV plus assay is intended as an aid in the diagnosis of influenza from Nasopharyngeal swab specimens and should not be used as a sole basis for treatment. Nasal washings and aspirates are unacceptable for Xpert Xpress SARS-CoV-2/FLU/RSV testing.  Fact Sheet for Patients: bloggercourse.com  Fact Sheet for Healthcare Providers: seriousbroker.it  This test is not yet approved or cleared by the [place]  FDA and has been authorized for detection and/or diagnosis of SARS-CoV-2 by FDA under an Emergency Use Authorization (EUA). This EUA will remain in effect (meaning this test can be used) for the duration of the COVID-19 declaration under Section 564(b)(1) of the Act, 21 U.S.C. section 360bbb-3(b)(1), unless the authorization is terminated or revoked.     Resp Syncytial Virus by PCR NEGATIVE NEGATIVE Final    Comment: (NOTE) Fact Sheet for Patients: bloggercourse.com  Fact Sheet for Healthcare Providers: seriousbroker.it  This test is not yet approved or cleared by the [place]  FDA and has been authorized for detection and/or diagnosis of SARS-CoV-2 by FDA under an Emergency Use Authorization (EUA). This EUA will remain in effect (meaning this test can be used) for the duration of the COVID-19 declaration under Section 564(b)(1) of the Act, 21 U.S.C. section 360bbb-3(b)(1), unless the authorization is terminated  or revoked.  Performed at [facility], [address] [place], [place] [address]   Aerobic/Anaerobic Culture w Gram Stain (surgical/deep wound)     Status: None (Preliminary result)   Collection Time: 09/28/24  3:21 PM   Specimen: Arm, Left; Abscess  Result Value Ref Range Status   Specimen Description   Final    ABSCESS Performed at [facility], [address], [place], [place] [address]    Special Requests  Final    L AXILLA Performed at [facility], [address], [place], [place] [address]    Gram Stain   Final    NO WBC SEEN NO ORGANISMS SEEN Performed at [facility], [address], [place], [place] [address]    Culture   Final    MODERATE METHICILLIN RESISTANT STAPHYLOCOCCUS AUREUS NO ANAEROBES ISOLATED; CULTURE IN PROGRESS FOR 5 DAYS    Report Status PENDING  Incomplete   Organism ID, Bacteria METHICILLIN RESISTANT STAPHYLOCOCCUS AUREUS  Final      Susceptibility   Methicillin resistant staphylococcus aureus - MIC*    CIPROFLOXACIN <=0.5 SENSITIVE Sensitive     ERYTHROMYCIN >=8 RESISTANT Resistant     GENTAMICIN <=0.5 SENSITIVE Sensitive     OXACILLIN >=4 RESISTANT Resistant     TETRACYCLINE >=16 RESISTANT Resistant     VANCOMYCIN 1 SENSITIVE Sensitive     TRIMETH/SULFA <=10 SENSITIVE Sensitive     CLINDAMYCIN RESISTANT Resistant     RIFAMPIN <=0.5 SENSITIVE Sensitive     Inducible Clindamycin POSITIVE Resistant     LINEZOLID 2 SENSITIVE Sensitive     * MODERATE METHICILLIN RESISTANT STAPHYLOCOCCUS AUREUS  Aerobic/Anaerobic Culture w Gram Stain (surgical/deep wound)     Status: None (Preliminary result)   Collection Time: 09/28/24  3:24 PM   Specimen: Arm, Left; Abscess  Result Value Ref Range Status   Specimen Description   Final    ABSCESS Performed at [facility], [address] [place], [place] [address]    Special Requests   Final    L BREAST SEROMA Performed at  [facility], [address] [place], [place] [address]    Gram Stain   Final    RARE WBC PRESENT, PREDOMINANTLY MONONUCLEAR NO ORGANISMS SEEN    Culture   Final    NO GROWTH 3 DAYS NO ANAEROBES ISOLATED; CULTURE IN PROGRESS FOR 5 DAYS Performed at [facility], [address], [place], [place] [address]    Report Status PENDING  Incomplete         Radiology Studies: US  AXILLA LEFT Result Date: 09/30/2024 EXAM: US  LEFT UPPER EXTREMITY NONVASCULAR SOFT TISSUE 09/30/2024 07:25:36 PM TECHNIQUE: Real-time ultrasound scan of the left upper extremity with image documentation. COMPARISON: None available. CLINICAL HISTORY: Swelling in left armpit. FINDINGS: SOFT TISSUES: A complex multiloculated fluid collection is seen within the left axilla measuring 6.2 x 2.8 x 8.0 cm demonstrating multiple internal septa and debris. No significant surrounding hyperemia. The superior most and inferior most extent of the collection is not fully included on this examination, however, the collection appears entirely located within the subcutaneous soft tissues. IMPRESSION: 1. Complex multiloculated fluid collection within the left axilla measuring 6.2 x 2.8 x 8.0 cm, with multiple internal septa and debris, located within the subcutaneous soft tissues. No significant surrounding hyperemia. 2. The superior and inferior extents of the collection are not fully included on this examination. Electronically signed by: Dorethia Molt MD 09/30/2024 09:35 PM EST RP Workstation: [id number]   Scheduled Meds:  linezolid  600 mg Oral Q12H   Continuous Infusions:     LOS: 8 days   Time spent:  Elsie JAYSON Montclair, DO [facility]  If 7PM-7AM, please contact night-coverage [email or web address]  10/02/2024, 7:44 AM

## 2024-10-03 ENCOUNTER — Other Ambulatory Visit (HOSPITAL_COMMUNITY): Payer: Self-pay

## 2024-10-03 ENCOUNTER — Telehealth (HOSPITAL_COMMUNITY): Payer: Self-pay | Admitting: Pharmacy Technician

## 2024-10-03 DIAGNOSIS — N179 Acute kidney failure, unspecified: Secondary | ICD-10-CM | POA: Diagnosis not present

## 2024-10-03 DIAGNOSIS — R6520 Severe sepsis without septic shock: Secondary | ICD-10-CM | POA: Diagnosis not present

## 2024-10-03 DIAGNOSIS — A419 Sepsis, unspecified organism: Secondary | ICD-10-CM | POA: Diagnosis not present

## 2024-10-03 DIAGNOSIS — L0291 Cutaneous abscess, unspecified: Secondary | ICD-10-CM

## 2024-10-03 DIAGNOSIS — A403 Sepsis due to Streptococcus pneumoniae: Secondary | ICD-10-CM

## 2024-10-03 DIAGNOSIS — G7281 Critical illness myopathy: Secondary | ICD-10-CM | POA: Diagnosis not present

## 2024-10-03 LAB — AEROBIC/ANAEROBIC CULTURE W GRAM STAIN (SURGICAL/DEEP WOUND)
Culture: NO GROWTH
Gram Stain: NONE SEEN

## 2024-10-03 LAB — BASIC METABOLIC PANEL WITH GFR
Anion gap: 11 (ref 5–15)
BUN: 9 mg/dL (ref 6–20)
CO2: 22 mmol/L (ref 22–32)
Calcium: 8.9 mg/dL (ref 8.9–10.3)
Chloride: 105 mmol/L (ref 98–111)
Creatinine, Ser: 0.63 mg/dL (ref 0.44–1.00)
GFR, Estimated: >60 mL/min (ref >60)
Glucose, Bld: 110 mg/dL — ABNORMAL HIGH (ref 70–99)
Potassium: 4.2 mmol/L (ref 3.5–5.1)
Sodium: 138 mmol/L (ref 135–145)

## 2024-10-03 LAB — CBC
HCT: 38.4 % (ref 36.0–46.0)
Hemoglobin: 12.1 g/dL (ref 12.0–15.0)
MCH: 24.8 pg — ABNORMAL LOW (ref 26.0–34.0)
MCHC: 31.5 g/dL (ref 30.0–36.0)
MCV: 78.9 fL — ABNORMAL LOW (ref 80.0–100.0)
Platelets: 398 K/uL (ref 150–400)
RBC: 4.87 MIL/uL (ref 3.87–5.11)
RDW: 12.5 % (ref 11.5–15.5)
WBC: 5.8 K/uL (ref 4.0–10.5)
nRBC: 0 % (ref 0.0–0.2)

## 2024-10-03 MED ORDER — METOPROLOL TARTRATE 5 MG/5ML IV SOLN: 5.0000 mg | INTRAVENOUS | Status: DC | PRN

## 2024-10-03 MED ORDER — GLUCAGON HCL RDNA (DIAGNOSTIC) 1 MG IJ SOLR: 1.0000 mg | INTRAMUSCULAR | Status: DC | PRN

## 2024-10-03 MED ORDER — HYDRALAZINE HCL 20 MG/ML IJ SOLN: 10.0000 mg | INTRAMUSCULAR | Status: DC | PRN

## 2024-10-03 MED ORDER — IPRATROPIUM-ALBUTEROL 0.5-2.5 (3) MG/3ML IN SOLN: 3.0000 mL | RESPIRATORY_TRACT | Status: DC | PRN

## 2024-10-03 NOTE — Telephone Encounter (Signed)
 Patient Product/process Development Scientist completed.    The patient is insured through [facility].     Ran test claim for linezolid 600 mg and the current 10 day co-pay is $4.00.   This test claim was processed through [facility]- copay amounts may vary at other pharmacies due to pharmacy/plan contracts, or as the patient moves through the different stages of their insurance plan.     Reyes Sharps, CPHT Pharmacy Technician Patient Advocate Specialist [facility] Patient Advocate Team Direct Number: [phone]  Fax: [phone]

## 2024-10-03 NOTE — Consult Note (Signed)
 [facility]    Date of Admission:  09/24/2024     Reason for Consult: surgical site infection/cellulitis    Referring Provider: Caleen    Abx: 11/9-c linezolid   11/8-14 flagyl  11/8-14 cefepime 11/8 vanc        Assessment: 48 yo female with breast cancer s/p neoadjuvant chemo and recent lumpectomy/lymph node dissection 1 month prior to admission admitted 11/8 for surgical site infection/abscess  I&D 11/12 cx mrsa  Patient clinically had improved greatly  Has surgical drain in place still  Will plan 2 weeks linezolid from 11/12 until 11/26    Plan: Continue bid linezolid 600 mg until 11/26 Maintain contact isolation precaution Labs cbc next week to monitor toxicity of linezolid Ok to discharge from id standpoint once cleared with surgery Id clinic f/u next week for lab check then 3 weeks after for infection remission assessment Discussed with primary team     ------------------------------------------------ Principal Problem:   Sepsis (HCC)    HPI: Wanda Garcia is a 48 y.o. female 1 month s/p left breast lumpectomy and axillary node dissection for breast cancer (s/p chemo last cycle 2 months prior to admission) admitted 09/24/24 for pain, n/v, malaise found to have cellulitis around site of the surgery and also mrsa on fluid collection culture   Patient febrile to 103 on admissions and had leukocytosis to 15 Chest ct showed fluid collection left chest surgical site Cellulitis changes were present  Admission bcx CoNS contaminant General surgery I&D the fluid collection and left a drain in; operative cx mrsa  She was started on linezolid  Said pain better Sepsis had resolved No further n/v/d    Family History  Problem Relation Age of Onset   Diabetes Father    Hypertension Father    Breast cancer Neg Hx     Social History   Tobacco Use   Smoking status: Every Day    Types: Cigarettes   Smokeless  tobacco: Never   Tobacco comments:    smokes 0-2 cigarettes / day  Vaping Use   Vaping status: Never Used  Substance Use Topics   Alcohol use: No   Drug use: No    Allergies  Allergen Reactions   Morphine  Palpitations   Porcine (Pork) Protein-Containing Drug Products Other (See Comments)    Religion     Review of Systems: ROS All Other ROS was negative, except mentioned above   Past Medical History:  Diagnosis Date   Arthritis    Breast cancer (HCC) 03/2024   left   Depression    situational   Headache    Neuropathy 07/20/2024   in fingers and toes       Scheduled Meds:  linezolid  600 mg Oral Q12H   Continuous Infusions: PRN Meds:.acetaminophen , glucagon (human recombinant), hydrALAZINE, HYDROmorphone  (DILAUDID ) injection, ipratropium-albuterol, melatonin, metoprolol tartrate, mouth rinse, oxyCODONE , polyethylene glycol, prochlorperazine    OBJECTIVE: Blood pressure 110/68, pulse 90, temperature 98.4 F (36.9 C), temperature source Oral, resp. rate 16, height 5' (1.524 m), weight 79.9 kg, last menstrual period 04/24/2024, SpO2 98%.  Physical Exam  General/constitutional: no distress, pleasant HEENT: Normocephalic, PER, Conj Clear, EOMI, Oropharynx clear Neck supple CV: rrr no mrg Lungs: clear to auscultation, normal respiratory effort Abd: Soft, Nontender Ext: no edema Skin/msk: left chest drain in place   Lab Results Lab Results  Component Value Date   WBC 5.8 10/03/2024   HGB 12.1 10/03/2024   HCT 38.4 10/03/2024  MCV 78.9 (L) 10/03/2024   PLT 398 10/03/2024    Lab Results  Component Value Date   CREATININE 0.63 10/03/2024   BUN 9 10/03/2024   NA 138 10/03/2024   K 4.2 10/03/2024   CL 105 10/03/2024   CO2 22 10/03/2024    Lab Results  Component Value Date   ALT 35 09/24/2024   AST 23 09/24/2024   ALKPHOS 92 09/24/2024   BILITOT 0.2 09/24/2024      Microbiology: Recent Results (from the past 240 hours)  Blood Culture (routine  x 2)     Status: Abnormal   Collection Time: 09/24/24  8:12 PM   Specimen: BLOOD  Result Value Ref Range Status   Specimen Description   Final    BLOOD RIGHT ANTECUBITAL Performed at [facility], [address], [place], [place] [address]    Special Requests   Final    BOTTLES DRAWN AEROBIC AND ANAEROBIC Blood Culture results may not be optimal due to an inadequate volume of blood received in culture bottles Performed at [facility], [address], [place], [place] [address]    Culture  Setup Time   Final    GRAM POSITIVE COCCI AEROBIC BOTTLE ONLY RBV MITCHELL PHARMD 09/26/2024 0030 BY DD    Culture (A)  Final    STAPHYLOCOCCUS EPIDERMIDIS THE SIGNIFICANCE OF ISOLATING THIS ORGANISM FROM A SINGLE SET OF BLOOD CULTURES WHEN MULTIPLE SETS ARE DRAWN IS UNCERTAIN. PLEASE NOTIFY THE MICROBIOLOGY DEPARTMENT WITHIN ONE WEEK IF SPECIATION AND SENSITIVITIES ARE REQUIRED. Performed at [facility], [address] [place], [place] [address]    Report Status 09/27/2024 FINAL  Final  Blood Culture (routine x 2)     Status: None   Collection Time: 09/24/24  8:12 PM   Specimen: BLOOD  Result Value Ref Range Status   Specimen Description   Final    BLOOD RIGHT ANTECUBITAL Performed at [facility], [address], [place], [place] [address]    Special Requests   Final    BOTTLES DRAWN AEROBIC AND ANAEROBIC Blood Culture results may not be optimal due to an inadequate volume of blood received in culture bottles Performed at [facility], [address] [place], [place] [address]    Culture   Final    NO GROWTH 5 DAYS Performed at [facility], [address] [place], [place] [address]    Report Status 09/29/2024 FINAL  Final  Blood Culture ID Panel (Reflexed)     Status: Abnormal   Collection Time: 09/24/24  8:12 PM  Result Value Ref Range Status   Enterococcus faecalis NOT DETECTED NOT DETECTED  Final   Enterococcus Faecium NOT DETECTED NOT DETECTED Final   Listeria monocytogenes NOT DETECTED NOT DETECTED Final   Staphylococcus species DETECTED (A) NOT DETECTED Final    Comment: CRITICAL RESULT CALLED TO, READ BACK BY AND VERIFIED WITH: RBV MITCHELL PHARMD 09/26/2024 0030 BY DD    Staphylococcus aureus (BCID) NOT DETECTED NOT DETECTED Final   Staphylococcus epidermidis DETECTED (A) NOT DETECTED Final    Comment: CRITICAL RESULT CALLED TO, READ BACK BY AND VERIFIED WITH: RBV MITCHELL PHARMD 09/26/2024 0030 BY DD    Staphylococcus lugdunensis NOT DETECTED NOT DETECTED Final   Streptococcus species NOT DETECTED NOT DETECTED Final   Streptococcus agalactiae NOT DETECTED NOT DETECTED Final   Streptococcus pneumoniae NOT DETECTED NOT DETECTED Final   Streptococcus pyogenes NOT DETECTED NOT DETECTED Final  A.calcoaceticus-baumannii NOT DETECTED NOT DETECTED Final   Bacteroides fragilis NOT DETECTED NOT DETECTED Final   Enterobacterales NOT DETECTED NOT DETECTED Final   Enterobacter cloacae complex NOT DETECTED NOT DETECTED Final   Escherichia coli NOT DETECTED NOT DETECTED Final   Klebsiella aerogenes NOT DETECTED NOT DETECTED Final   Klebsiella oxytoca NOT DETECTED NOT DETECTED Final   Klebsiella pneumoniae NOT DETECTED NOT DETECTED Final   Proteus species NOT DETECTED NOT DETECTED Final   Salmonella species NOT DETECTED NOT DETECTED Final   Serratia marcescens NOT DETECTED NOT DETECTED Final   Haemophilus influenzae NOT DETECTED NOT DETECTED Final   Neisseria meningitidis NOT DETECTED NOT DETECTED Final   Pseudomonas aeruginosa NOT DETECTED NOT DETECTED Final   Stenotrophomonas maltophilia NOT DETECTED NOT DETECTED Final   Candida albicans NOT DETECTED NOT DETECTED Final   Candida auris NOT DETECTED NOT DETECTED Final   Candida glabrata NOT DETECTED NOT DETECTED Final   Candida krusei NOT DETECTED NOT DETECTED Final   Candida parapsilosis NOT DETECTED NOT DETECTED Final    Candida tropicalis NOT DETECTED NOT DETECTED Final   Cryptococcus neoformans/gattii NOT DETECTED NOT DETECTED Final   Methicillin resistance mecA/C NOT DETECTED NOT DETECTED Final    Comment: Performed at [facility], [address] [place], [place] [address]  Resp panel by RT-PCR (RSV, Flu A&B, Covid) Anterior Nasal Swab     Status: None   Collection Time: 09/24/24  8:44 PM   Specimen: Anterior Nasal Swab  Result Value Ref Range Status   SARS Coronavirus 2 by RT PCR NEGATIVE NEGATIVE Final    Comment: (NOTE) SARS-CoV-2 target nucleic acids are NOT DETECTED.  The SARS-CoV-2 RNA is generally detectable in upper respiratory specimens during the acute phase of infection. The lowest concentration of SARS-CoV-2 viral copies this assay can detect is 138 copies/mL. A negative result does not preclude SARS-Cov-2 infection and should not be used as the sole basis for treatment or other patient management decisions. A negative result may occur with  improper specimen collection/handling, submission of specimen other than nasopharyngeal swab, presence of viral mutation(s) within the areas targeted by this assay, and inadequate number of viral copies(<138 copies/mL). A negative result must be combined with clinical observations, patient history, and epidemiological information. The expected result is Negative.  Fact Sheet for Patients:  bloggercourse.com  Fact Sheet for Healthcare Providers:  seriousbroker.it  This test is no t yet approved or cleared by the [place]  FDA and  has been authorized for detection and/or diagnosis of SARS-CoV-2 by FDA under an Emergency Use Authorization (EUA). This EUA will remain  in effect (meaning this test can be used) for the duration of the COVID-19 declaration under Section 564(b)(1) of the Act, 21 U.S.C.section 360bbb-3(b)(1), unless the authorization is terminated  or revoked sooner.        Influenza A by PCR NEGATIVE NEGATIVE Final   Influenza B by PCR NEGATIVE NEGATIVE Final    Comment: (NOTE) The Xpert Xpress SARS-CoV-2/FLU/RSV plus assay is intended as an aid in the diagnosis of influenza from Nasopharyngeal swab specimens and should not be used as a sole basis for treatment. Nasal washings and aspirates are unacceptable for Xpert Xpress SARS-CoV-2/FLU/RSV testing.  Fact Sheet for Patients: bloggercourse.com  Fact Sheet for Healthcare Providers: seriousbroker.it  This test is not yet approved or cleared by the [place]  FDA and has been authorized for detection and/or diagnosis of SARS-CoV-2 by FDA under an Emergency Use Authorization (EUA). This EUA will remain in  effect (meaning this test can be used) for the duration of the COVID-19 declaration under Section 564(b)(1) of the Act, 21 U.S.C. section 360bbb-3(b)(1), unless the authorization is terminated or revoked.     Resp Syncytial Virus by PCR NEGATIVE NEGATIVE Final    Comment: (NOTE) Fact Sheet for Patients: bloggercourse.com  Fact Sheet for Healthcare Providers: seriousbroker.it  This test is not yet approved or cleared by the [place]  FDA and has been authorized for detection and/or diagnosis of SARS-CoV-2 by FDA under an Emergency Use Authorization (EUA). This EUA will remain in effect (meaning this test can be used) for the duration of the COVID-19 declaration under Section 564(b)(1) of the Act, 21 U.S.C. section 360bbb-3(b)(1), unless the authorization is terminated or revoked.  Performed at [facility], [address], [place], [place] [address]   Aerobic/Anaerobic Culture w Gram Stain (surgical/deep wound)     Status: None   Collection Time: 09/28/24  3:21 PM   Specimen: Arm, Left; Abscess  Result Value Ref Range Status   Specimen Description   Final     ABSCESS Performed at [facility], [address], [place], [place] [address]    Special Requests   Final    LITTIE BIENENSTOCK Performed at [facility], [address], [place], [place] [address]    Gram Stain NO WBC SEEN NO ORGANISMS SEEN   Final   Culture   Final    MODERATE METHICILLIN RESISTANT STAPHYLOCOCCUS AUREUS NO ANAEROBES ISOLATED Performed at [facility], [address], [place], [place] [address]    Report Status 10/03/2024 FINAL  Final   Organism ID, Bacteria METHICILLIN RESISTANT STAPHYLOCOCCUS AUREUS  Final      Susceptibility   Methicillin resistant staphylococcus aureus - MIC*    CIPROFLOXACIN <=0.5 SENSITIVE Sensitive     ERYTHROMYCIN >=8 RESISTANT Resistant     GENTAMICIN <=0.5 SENSITIVE Sensitive     OXACILLIN >=4 RESISTANT Resistant     TETRACYCLINE >=16 RESISTANT Resistant     VANCOMYCIN 1 SENSITIVE Sensitive     TRIMETH/SULFA <=10 SENSITIVE Sensitive     CLINDAMYCIN RESISTANT Resistant     RIFAMPIN <=0.5 SENSITIVE Sensitive     Inducible Clindamycin POSITIVE Resistant     LINEZOLID 2 SENSITIVE Sensitive     * MODERATE METHICILLIN RESISTANT STAPHYLOCOCCUS AUREUS  Aerobic/Anaerobic Culture w Gram Stain (surgical/deep wound)     Status: None   Collection Time: 09/28/24  3:24 PM   Specimen: Arm, Left; Abscess  Result Value Ref Range Status   Specimen Description   Final    ABSCESS Performed at [facility], [address], [place], [place] [address]    Special Requests   Final    L BREAST SEROMA Performed at [facility], [address] [place], [place] [address]    Gram Stain   Final    RARE WBC PRESENT, PREDOMINANTLY MONONUCLEAR NO ORGANISMS SEEN    Culture   Final    No growth aerobically or anaerobically. Performed at [facility], [address], [place], [place] [address]    Report Status 10/03/2024 FINAL  Final     Serology:    Imaging: If present,  new imagings (plain films, ct scans, and mri) have been personally visualized and interpreted; radiology reports have been reviewed. Decision making incorporated into the Impression / Recommendations.  09/24/24 chest ct 1. Postsurgical changes in the left axilla with generalized edema and a surgical drain  extending along the left chest wall. 2. Soft tissue density measuring approximately 7.0 x 3.8 cm posterolaterally within the left breast, likely representing a hematoma given recent surgery and separate from the drainage catheter.   Constance ONEIDA Passer, MD [facility] [phone] pager    10/03/2024, 4:16 PM

## 2024-10-03 NOTE — Plan of Care (Signed)

## 2024-10-03 NOTE — Progress Notes (Signed)
 5 Days Post-Op   Video interpreter used.   Subjective/Chief Complaint: Pt feeling much better this AM.  Still having pain when oxy wears off.    Objective: Vital signs in last 24 hours: Temp:  [97.8 F (36.6 C)-98.4 F (36.9 C)] 98.4 F (36.9 C) (11/17 0825) Pulse Rate:  [73-90] 90 (11/17 0825) Resp:  [16-20] 16 (11/17 0825) BP: (110-117)/(55-73) 110/68 (11/17 0825) SpO2:  [98 %-100 %] 98 % (11/17 0825) Last BM Date : 10/02/24  Intake/Output from previous day: 11/16 0701 - 11/17 0700 In: -  Out: 11 [Drains:11] Intake/Output this shift: No intake/output data recorded.  Tearful, reports some pain near the left shoulder/ axilla No erythema around incision Breast/axillary skin much softer now.  Drain holding suction; serous output now.   Lab Results:  Recent Labs    09/30/24 1125 10/03/24 0600  WBC 6.7 5.8  HGB 11.6* 12.1  HCT 36.7 38.4  PLT 446* 398   BMET Recent Labs    09/30/24 1125 10/03/24 0600  NA 139 138  K 3.8 4.2  CL 103 105  CO2 24 22  GLUCOSE 85 110*  BUN 9 9  CREATININE 0.51 0.63  CALCIUM 9.3 8.9    Studies/Results: No results found.   Anti-infectives: Anti-infectives (From admission, onward)    Start     Dose/Rate Route Frequency Ordered Stop   10/01/24 1430  linezolid (ZYVOX) tablet 600 mg        600 mg Oral Every 12 hours 10/01/24 1344     09/25/24 1000  linezolid (ZYVOX) IVPB 600 mg  Status:  Discontinued        600 mg 300 mL/hr over 60 Minutes Intravenous Every 12 hours 09/25/24 0357 10/01/24 1344   09/25/24 0800  metroNIDAZOLE  (FLAGYL ) IVPB 500 mg  Status:  Discontinued        500 mg 100 mL/hr over 60 Minutes Intravenous Every 12 hours 09/25/24 0357 09/30/24 1345   09/25/24 0400  ceFEPIme (MAXIPIME) 2 g in sodium chloride  0.9 % 100 mL IVPB  Status:  Discontinued        2 g 200 mL/hr over 30 Minutes Intravenous Every 8 hours 09/25/24 0357 09/30/24 1345   09/24/24 2000  ceFEPIme (MAXIPIME) 2 g in sodium chloride  0.9 % 100 mL IVPB         2 g 200 mL/hr over 30 Minutes Intravenous  Once 09/24/24 1953 09/24/24 2108   09/24/24 2000  metroNIDAZOLE  (FLAGYL ) IVPB 500 mg        500 mg 100 mL/hr over 60 Minutes Intravenous  Once 09/24/24 1953 09/24/24 2205   09/24/24 2000  vancomycin (VANCOCIN) IVPB 1000 mg/200 mL premix        1,000 mg 200 mL/hr over 60 Minutes Intravenous  Once 09/24/24 1953 09/24/24 2324       Assessment/Plan: Left breast cancer, s/p lumpectomy, targeted/sentinel node 08/23/2028, then completion ALND 09/14/2024. Takeback for washout of axilla 11/12.  Surgicel Powder used for hemostasis   Cellulitis - dramatic improvement Fever-resolved.    Continue antibiotics MRSA from axillary abscess.   Ultrasound showed residual fluid collection - but drain is now putting out more output.  Dark color of drainage is likely from surgicel powder.  No sign of purulent drainage   Continue oxy.  Remains on Zyvox. Care complicated by lack of social support as husband unable to take off to help her at home.  Pt close to discharge at this point.  Drain teaching.    LOS: 9 days  Jina LITTIE Nephew, MD, FACS, FSSO Surgical Oncology, General Surgery, Trauma and Critical [facility], [place] [phone] for weekday/non holidays Check [email or web address] for coverage night/weekend/holidays under General Surgery

## 2024-10-03 NOTE — Progress Notes (Signed)
 PROGRESS NOTE    Wanda Garcia  FMW:[id number] DOB: 29-Jul-1976 DOA: 09/24/2024 PCP: [facility]    Brief Narrative:  a 48 y.o. female with PMH of  for recently diagnosed left breast cancer 03/22/2024 status post chemotherapy (last chemotherapy was about 2 months ago-was stopped prematurely due to chemo induced neuropathy), anxiety, status post left axillary lymphadenopathy on 09/14/2024 with drain, status post port site removal, who presents to the ER sent from urgent care with concern for possible port site infection, generalized weakness and fatigue, nausea, and vomiting.  Patient was found to have sepsis secondary to left base cellulitis/drain site infection.  Had a left breast cancer status postlumpectomy, targeted sentinel node 10/7.  Recently underwent left axillary lymphadenectomy with drain status post port removal on 10/29.  Had left axillary washout on 09/28/2024.   Assessment & Plan:    Severe sepsis POA from left base cellulitis/suspected drain site infection, port site infection, POA Left breast cancer s/p lumpectomy, targeted/sentinel node 10/7 and then completion ALND 10/29 Hematoma on the left breast Rule out bacteremia - Recent left axillary lymphadenopathy on 09/14/2024 with drain, s/p recent port site removal.  Left axillary washout 10/12.  Current plans to await culture data, consult ID -Pain control   Suspected red man syndrome after IV vancomycin infusion IV vancomycin has been discontinued, on linezolid but refusing as above    Left breast cancer status postchemotherapy Chemo-induced polyneuropathy Chemotherapy was interrupted, about 2 months ago, due to chemo induced polyneuropathy.F/b Dr. Odean   Anemia likely multifactorial from acute illness and malignancy: Hemoglobin stable.  Monitor   Class I Obesity w/ Body mass index is 34.4 kg/m.: Will benefit with PCP follow-up, weight loss,healthy lifestyle   Tobacco use disorder Cessation  counseling     DVT prophylaxis: SCDs Start: 09/24/24 2245    Code Status: Full Code Family Communication:   Status is: Inpatient Remains inpatient appropriate because: Hopefully home once cleared by ID and general surgery   PT Follow up Recs:   Subjective:  Still with cording of some left arm discomfort.  No other complaints  Examination:  General exam: Appears calm and comfortable  Respiratory system: Clear to auscultation. Respiratory effort normal. Cardiovascular system: S1 & S2 heard, RRR. No JVD, murmurs, rubs, gallops or clicks. No pedal edema. Gastrointestinal system: Abdomen is nondistended, soft and nontender. No organomegaly or masses felt. Normal bowel sounds heard. Central nervous system: Alert and oriented. No focal neurological deficits. Extremities: Symmetric 5 x 5 power. Skin: No rashes, lesions or ulcers Psychiatry: Judgement and insight appear normal. Mood & affect appropriate.                Diet Orders (From admission, onward)     Start     Ordered   09/28/24 1718  Diet regular Room service appropriate? Yes; Fluid consistency: Thin  Diet effective now       Question Answer Comment  Room service appropriate? Yes   Fluid consistency: Thin      09/28/24 1717            Objective: Vitals:   10/02/24 1331 10/02/24 1516 10/02/24 1900 10/03/24 0825  BP: 117/63 110/73 (!) 113/55 110/68  Pulse: 78 73 76 90  Resp: 20 20 18 16   Temp: 98.3 F (36.8 C) 98 F (36.7 C) 97.8 F (36.6 C) 98.4 F (36.9 C)  TempSrc: Oral Oral Oral Oral  SpO2: 100% 99% 99% 98%  Weight:      Height:  Intake/Output Summary (Last 24 hours) at 10/03/2024 1242 Last data filed at 10/03/2024 0500 Gross per 24 hour  Intake --  Output 1 ml  Net -1 ml   Filed Weights   09/25/24 0214 09/28/24 1311  Weight: 79.9 kg 79.9 kg    Scheduled Meds:  linezolid  600 mg Oral Q12H   Continuous Infusions:  Nutritional status     Body mass index is 34.4  kg/m.  Data Reviewed:   CBC: Recent Labs  Lab 09/27/24 0424 09/28/24 0410 09/29/24 0417 09/30/24 1125 10/03/24 0600  WBC 10.9* 11.0* 7.2 6.7 5.8  HGB 11.0* 11.2* 11.5* 11.6* 12.1  HCT 34.5* 34.3* 36.1 36.7 38.4  MCV 79.9* 78.1* 78.1* 78.9* 78.9*  PLT 385 404* 439* 446* 398   Basic Metabolic Panel: Recent Labs  Lab 09/27/24 0424 09/28/24 0410 09/29/24 0417 09/30/24 1125 10/03/24 0600  NA 139 134* 136 139 138  K 3.8 3.9 4.1 3.8 4.2  CL 103 100 101 103 105  CO2 26 22 25 24 22   GLUCOSE 89 94 135* 85 110*  BUN 6 8 12 9 9   CREATININE 0.54 0.51 0.48 0.51 0.63  CALCIUM 9.0 8.7* 9.1 9.3 8.9   GFR: Estimated Creatinine Clearance: 79.6 mL/min (by C-G formula based on SCr of 0.63 mg/dL). Liver Function Tests: No results for input(s): AST, ALT, ALKPHOS, BILITOT, PROT, ALBUMIN in the last 168 hours. No results for input(s): LIPASE, AMYLASE in the last 168 hours. No results for input(s): AMMONIA in the last 168 hours. Coagulation Profile: No results for input(s): INR, PROTIME in the last 168 hours. Cardiac Enzymes: No results for input(s): CKTOTAL, CKMB, CKMBINDEX, TROPONINI in the last 168 hours. BNP (last 3 results) No results for input(s): PROBNP in the last 8760 hours. HbA1C: No results for input(s): HGBA1C in the last 72 hours. CBG: No results for input(s): GLUCAP in the last 168 hours. Lipid Profile: No results for input(s): CHOL, HDL, LDLCALC, TRIG, CHOLHDL, LDLDIRECT in the last 72 hours. Thyroid Function Tests: No results for input(s): TSH, T4TOTAL, FREET4, T3FREE, THYROIDAB in the last 72 hours. Anemia Panel: No results for input(s): VITAMINB12, FOLATE, FERRITIN, TIBC, IRON, RETICCTPCT in the last 72 hours. Sepsis Labs: No results for input(s): PROCALCITON, LATICACIDVEN in the last 168 hours.  Recent Results (from the past 240 hours)  Blood Culture (routine x 2)     Status: Abnormal    Collection Time: 09/24/24  8:12 PM   Specimen: BLOOD  Result Value Ref Range Status   Specimen Description   Final    BLOOD RIGHT ANTECUBITAL Performed at [facility], [address] [place], [place] [address]    Special Requests   Final    BOTTLES DRAWN AEROBIC AND ANAEROBIC Blood Culture results may not be optimal due to an inadequate volume of blood received in culture bottles Performed at [facility], [address] [place], [place] [address]    Culture  Setup Time   Final    GRAM POSITIVE COCCI AEROBIC BOTTLE ONLY RBV MITCHELL PHARMD 09/26/2024 0030 BY DD    Culture (A)  Final    STAPHYLOCOCCUS EPIDERMIDIS THE SIGNIFICANCE OF ISOLATING THIS ORGANISM FROM A SINGLE SET OF BLOOD CULTURES WHEN MULTIPLE SETS ARE DRAWN IS UNCERTAIN. PLEASE NOTIFY THE MICROBIOLOGY DEPARTMENT WITHIN ONE WEEK IF SPECIATION AND SENSITIVITIES ARE REQUIRED. Performed at [facility], [address], [place], [place] [address]    Report Status 09/27/2024 FINAL  Final  Blood Culture (routine x  2)     Status: None   Collection Time: 09/24/24  8:12 PM   Specimen: BLOOD  Result Value Ref Range Status   Specimen Description   Final    BLOOD RIGHT ANTECUBITAL Performed at [facility], [address] [place], [place] [address]    Special Requests   Final    BOTTLES DRAWN AEROBIC AND ANAEROBIC Blood Culture results may not be optimal due to an inadequate volume of blood received in culture bottles Performed at [facility], [address], [place], [place] [address]    Culture   Final    NO GROWTH 5 DAYS Performed at [facility], [address], [place], [place] [address]    Report Status 09/29/2024 FINAL  Final  Blood Culture ID Panel (Reflexed)     Status: Abnormal   Collection Time: 09/24/24  8:12 PM  Result Value Ref Range Status   Enterococcus faecalis NOT DETECTED NOT DETECTED Final   Enterococcus Faecium  NOT DETECTED NOT DETECTED Final   Listeria monocytogenes NOT DETECTED NOT DETECTED Final   Staphylococcus species DETECTED (A) NOT DETECTED Final    Comment: CRITICAL RESULT CALLED TO, READ BACK BY AND VERIFIED WITH: RBV MITCHELL PHARMD 09/26/2024 0030 BY DD    Staphylococcus aureus (BCID) NOT DETECTED NOT DETECTED Final   Staphylococcus epidermidis DETECTED (A) NOT DETECTED Final    Comment: CRITICAL RESULT CALLED TO, READ BACK BY AND VERIFIED WITH: RBV MITCHELL PHARMD 09/26/2024 0030 BY DD    Staphylococcus lugdunensis NOT DETECTED NOT DETECTED Final   Streptococcus species NOT DETECTED NOT DETECTED Final   Streptococcus agalactiae NOT DETECTED NOT DETECTED Final   Streptococcus pneumoniae NOT DETECTED NOT DETECTED Final   Streptococcus pyogenes NOT DETECTED NOT DETECTED Final   A.calcoaceticus-baumannii NOT DETECTED NOT DETECTED Final   Bacteroides fragilis NOT DETECTED NOT DETECTED Final   Enterobacterales NOT DETECTED NOT DETECTED Final   Enterobacter cloacae complex NOT DETECTED NOT DETECTED Final   Escherichia coli NOT DETECTED NOT DETECTED Final   Klebsiella aerogenes NOT DETECTED NOT DETECTED Final   Klebsiella oxytoca NOT DETECTED NOT DETECTED Final   Klebsiella pneumoniae NOT DETECTED NOT DETECTED Final   Proteus species NOT DETECTED NOT DETECTED Final   Salmonella species NOT DETECTED NOT DETECTED Final   Serratia marcescens NOT DETECTED NOT DETECTED Final   Haemophilus influenzae NOT DETECTED NOT DETECTED Final   Neisseria meningitidis NOT DETECTED NOT DETECTED Final   Pseudomonas aeruginosa NOT DETECTED NOT DETECTED Final   Stenotrophomonas maltophilia NOT DETECTED NOT DETECTED Final   Candida albicans NOT DETECTED NOT DETECTED Final   Candida auris NOT DETECTED NOT DETECTED Final   Candida glabrata NOT DETECTED NOT DETECTED Final   Candida krusei NOT DETECTED NOT DETECTED Final   Candida parapsilosis NOT DETECTED NOT DETECTED Final   Candida tropicalis NOT DETECTED  NOT DETECTED Final   Cryptococcus neoformans/gattii NOT DETECTED NOT DETECTED Final   Methicillin resistance mecA/C NOT DETECTED NOT DETECTED Final    Comment: Performed at [facility], [address] [place], [place] [address]  Resp panel by RT-PCR (RSV, Flu A&B, Covid) Anterior Nasal Swab     Status: None   Collection Time: 09/24/24  8:44 PM   Specimen: Anterior Nasal Swab  Result Value Ref Range Status   SARS Coronavirus 2 by RT PCR NEGATIVE NEGATIVE Final    Comment: (NOTE) SARS-CoV-2 target nucleic acids are NOT DETECTED.  The SARS-CoV-2 RNA is generally detectable in upper  respiratory specimens during the acute phase of infection. The lowest concentration of SARS-CoV-2 viral copies this assay can detect is 138 copies/mL. A negative result does not preclude SARS-Cov-2 infection and should not be used as the sole basis for treatment or other patient management decisions. A negative result may occur with  improper specimen collection/handling, submission of specimen other than nasopharyngeal swab, presence of viral mutation(s) within the areas targeted by this assay, and inadequate number of viral copies(<138 copies/mL). A negative result must be combined with clinical observations, patient history, and epidemiological information. The expected result is Negative.  Fact Sheet for Patients:  bloggercourse.com  Fact Sheet for Healthcare Providers:  seriousbroker.it  This test is no t yet approved or cleared by the [place]  FDA and  has been authorized for detection and/or diagnosis of SARS-CoV-2 by FDA under an Emergency Use Authorization (EUA). This EUA will remain  in effect (meaning this test can be used) for the duration of the COVID-19 declaration under Section 564(b)(1) of the Act, 21 U.S.C.section 360bbb-3(b)(1), unless the authorization is terminated  or revoked sooner.       Influenza A by PCR NEGATIVE  NEGATIVE Final   Influenza B by PCR NEGATIVE NEGATIVE Final    Comment: (NOTE) The Xpert Xpress SARS-CoV-2/FLU/RSV plus assay is intended as an aid in the diagnosis of influenza from Nasopharyngeal swab specimens and should not be used as a sole basis for treatment. Nasal washings and aspirates are unacceptable for Xpert Xpress SARS-CoV-2/FLU/RSV testing.  Fact Sheet for Patients: bloggercourse.com  Fact Sheet for Healthcare Providers: seriousbroker.it  This test is not yet approved or cleared by the [place]  FDA and has been authorized for detection and/or diagnosis of SARS-CoV-2 by FDA under an Emergency Use Authorization (EUA). This EUA will remain in effect (meaning this test can be used) for the duration of the COVID-19 declaration under Section 564(b)(1) of the Act, 21 U.S.C. section 360bbb-3(b)(1), unless the authorization is terminated or revoked.     Resp Syncytial Virus by PCR NEGATIVE NEGATIVE Final    Comment: (NOTE) Fact Sheet for Patients: bloggercourse.com  Fact Sheet for Healthcare Providers: seriousbroker.it  This test is not yet approved or cleared by the [place]  FDA and has been authorized for detection and/or diagnosis of SARS-CoV-2 by FDA under an Emergency Use Authorization (EUA). This EUA will remain in effect (meaning this test can be used) for the duration of the COVID-19 declaration under Section 564(b)(1) of the Act, 21 U.S.C. section 360bbb-3(b)(1), unless the authorization is terminated or revoked.  Performed at [facility], [address] [place], [place] [address]   Aerobic/Anaerobic Culture w Gram Stain (surgical/deep wound)     Status: None   Collection Time: 09/28/24  3:21 PM   Specimen: Arm, Left; Abscess  Result Value Ref Range Status   Specimen Description   Final    ABSCESS Performed at [facility], [address] [place], [place] [address]    Special Requests   Final    LITTIE BIENENSTOCK Performed at [facility], [address], [place], [place] [address]    Gram Stain NO WBC SEEN NO ORGANISMS SEEN   Final   Culture   Final    MODERATE METHICILLIN RESISTANT STAPHYLOCOCCUS AUREUS NO ANAEROBES ISOLATED Performed at [facility], [address], [place], [place] [address]    Report Status 10/03/2024 FINAL  Final   Organism ID, Bacteria METHICILLIN RESISTANT STAPHYLOCOCCUS AUREUS  Final  Susceptibility   Methicillin resistant staphylococcus aureus - MIC*    CIPROFLOXACIN <=0.5 SENSITIVE Sensitive     ERYTHROMYCIN >=8 RESISTANT Resistant     GENTAMICIN <=0.5 SENSITIVE Sensitive     OXACILLIN >=4 RESISTANT Resistant     TETRACYCLINE >=16 RESISTANT Resistant     VANCOMYCIN 1 SENSITIVE Sensitive     TRIMETH/SULFA <=10 SENSITIVE Sensitive     CLINDAMYCIN RESISTANT Resistant     RIFAMPIN <=0.5 SENSITIVE Sensitive     Inducible Clindamycin POSITIVE Resistant     LINEZOLID 2 SENSITIVE Sensitive     * MODERATE METHICILLIN RESISTANT STAPHYLOCOCCUS AUREUS  Aerobic/Anaerobic Culture w Gram Stain (surgical/deep wound)     Status: None   Collection Time: 09/28/24  3:24 PM   Specimen: Arm, Left; Abscess  Result Value Ref Range Status   Specimen Description   Final    ABSCESS Performed at [facility], [address], [place], [place] [address]    Special Requests   Final    L BREAST SEROMA Performed at [facility], [address] [place], [place] [address]    Gram Stain   Final    RARE WBC PRESENT, PREDOMINANTLY MONONUCLEAR NO ORGANISMS SEEN    Culture   Final    No growth aerobically or anaerobically. Performed at [facility], [address] [place], [place] [address]    Report Status 10/03/2024 FINAL  Final         Radiology Studies: No results found.         LOS: 9 days    Time spent= 35 mins    Burgess JAYSON Dare, MD [facility]  If 7PM-7AM, please contact night-coverage  10/03/2024, 12:42 PM

## 2024-10-04 ENCOUNTER — Other Ambulatory Visit (HOSPITAL_COMMUNITY): Payer: Self-pay

## 2024-10-04 ENCOUNTER — Encounter: Payer: Self-pay | Admitting: Hematology and Oncology

## 2024-10-04 ENCOUNTER — Telehealth: Payer: Self-pay

## 2024-10-04 DIAGNOSIS — A4902 Methicillin resistant Staphylococcus aureus infection, unspecified site: Secondary | ICD-10-CM | POA: Diagnosis not present

## 2024-10-04 DIAGNOSIS — Z5181 Encounter for therapeutic drug level monitoring: Secondary | ICD-10-CM

## 2024-10-04 LAB — BASIC METABOLIC PANEL WITH GFR
Anion gap: 13 (ref 5–15)
BUN: 14 mg/dL (ref 6–20)
CO2: 23 mmol/L (ref 22–32)
Calcium: 9.3 mg/dL (ref 8.9–10.3)
Chloride: 100 mmol/L (ref 98–111)
Creatinine, Ser: 0.79 mg/dL (ref 0.44–1.00)
GFR, Estimated: >60 mL/min (ref >60)
Glucose, Bld: 113 mg/dL — ABNORMAL HIGH (ref 70–99)
Potassium: 5 mmol/L (ref 3.5–5.1)
Sodium: 136 mmol/L (ref 135–145)

## 2024-10-04 LAB — CBC
HCT: 40.9 % (ref 36.0–46.0)
Hemoglobin: 12.9 g/dL (ref 12.0–15.0)
MCH: 25 pg — ABNORMAL LOW (ref 26.0–34.0)
MCHC: 31.5 g/dL (ref 30.0–36.0)
MCV: 79.3 fL — ABNORMAL LOW (ref 80.0–100.0)
Platelets: 436 K/uL — ABNORMAL HIGH (ref 150–400)
RBC: 5.16 MIL/uL — ABNORMAL HIGH (ref 3.87–5.11)
RDW: 12.5 % (ref 11.5–15.5)
WBC: 6.6 K/uL (ref 4.0–10.5)
nRBC: 0 % (ref 0.0–0.2)

## 2024-10-04 LAB — MAGNESIUM: Magnesium: 2 mg/dL (ref 1.7–2.4)

## 2024-10-04 MED ORDER — OXYCODONE HCL 5 MG PO TABS: 5.0000 mg | ORAL_TABLET | Freq: Four times a day (QID) | ORAL | 0 refills | Status: DC | PRN

## 2024-10-04 MED ORDER — POLYETHYLENE GLYCOL 3350 17 GM/SCOOP PO POWD
17.0000 g | Freq: Every day | ORAL | 0 refills | Status: AC | PRN
  Filled 2024-10-04: qty 238, 14d supply, fill #0

## 2024-10-04 MED ORDER — OXYCODONE HCL 5 MG PO TABS
5.0000 mg | ORAL_TABLET | Freq: Four times a day (QID) | ORAL | 0 refills | Status: AC | PRN
  Filled 2024-10-04: qty 30, 4d supply, fill #0

## 2024-10-04 MED ORDER — LINEZOLID 600 MG PO TABS
600.0000 mg | ORAL_TABLET | Freq: Two times a day (BID) | ORAL | 0 refills | Status: AC
  Filled 2024-10-04: qty 24, 12d supply, fill #0

## 2024-10-04 MED ORDER — OXYCODONE HCL 5 MG PO TABS
5.0000 mg | ORAL_TABLET | Freq: Four times a day (QID) | ORAL | 0 refills | Status: DC | PRN
  Filled 2024-10-04: qty 30, 4d supply, fill #0

## 2024-10-04 MED ORDER — LINEZOLID 600 MG PO TABS
600.0000 mg | ORAL_TABLET | Freq: Two times a day (BID) | ORAL | 0 refills | Status: DC
  Filled 2024-10-04: qty 24, 12d supply, fill #0

## 2024-10-04 MED ORDER — LINEZOLID 600 MG PO TABS
600.0000 mg | ORAL_TABLET | Freq: Two times a day (BID) | ORAL | 0 refills | Status: DC
  Filled 2024-10-04: qty 48, 24d supply, fill #0

## 2024-10-04 NOTE — Plan of Care (Signed)

## 2024-10-04 NOTE — Telephone Encounter (Signed)
-----   Message from Alan JINNY Geralds sent at 10/03/2024  4:33 PM EST ----- I'll be off, and Cassie only has a couple of open slots. I would honestly keep those open for any last minute [facility] patients that might be out of their window. I would set up for lab check! ----- Message ----- From: Overton Constance DASEN, MD Sent: 10/03/2024   4:31 PM EST To: Roll[facility] LABOR Casimiro Lienhard; Cassie L [address]  Hi Cassie/Amanda This lady just need a lab draw next week for linezolid monitoring  No providers have any open spot. Do you have open spot to do a cbc   If not, clinical team could you order cbc for next week and let her know   She does need a 3-4 weeks from now visit for wound check -- Rollene could you please book her   Thank you everyone

## 2024-10-04 NOTE — Telephone Encounter (Signed)
 Patient is scheduled for labs 11/24.  Follow up with Dr. Overton 12/9.  CBC order placed.   Neida Ellegood, BSN, RN

## 2024-10-04 NOTE — Discharge Summary (Signed)
 Physician Discharge Summary  Wanda Garcia FMW:[id number] DOB: [birth year] DOA: 09/24/2024  PCP: [facility]  Admit date: 09/24/2024 Discharge date: 10/04/2024  Admitted From: Home Disposition:  Home  Recommendations for Outpatient Follow-up:  Follow up with PCP in 1-2 weeks Please obtain BMP/CBC in one week your next doctors visit.  2 weeks of PO Linezolid Follow up with Gen Sx and ID outpatient.    Discharge Condition: Stable CODE STATUS: Full Diet recommendation: REgular  Brief/Interim Summary: Brief Narrative:  a 48 y.o. female with PMH of  for recently diagnosed left breast cancer 03/22/2024 status post chemotherapy (last chemotherapy was about 2 months ago-was stopped prematurely due to chemo induced neuropathy), anxiety, status post left axillary lymphadenopathy on 09/14/2024 with drain, status post port site removal, who presents to the ER sent from urgent care with concern for possible port site infection, generalized weakness and fatigue, nausea, and vomiting.  Patient was found to have sepsis secondary to left base cellulitis/drain site infection.  Had a left breast cancer status postlumpectomy, targeted sentinel node 10/7.  Recently underwent left axillary lymphadenectomy with drain status post port removal on 10/29.  Had left axillary washout on 09/28/2024.  In the hospitalization she was started on linezolid which she was tolerating well without any issues.  She did have a drain on the left side placed recently which is being managed by general surgery.  Will discharge her once cleared by general surgery on 2 weeks of Linezolid.   Assessment & Plan:    Severe sepsis POA from left base cellulitis/suspected drain site infection, port site infection, POA Left breast cancer s/p lumpectomy, targeted/sentinel node 10/7 and then completion ALND 10/29 Hematoma on the left breast Rule out bacteremia - Recent left axillary lymphadenopathy on 09/14/2024 with drain,  s/p recent port site removal.  Left axillary washout 10/12.  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 Odean   Anemia likely multifactorial from acute illness and malignancy: Hemoglobin stable.  Monitor   Class I Obesity w/ Body mass index is 34.4 kg/m.: Will benefit with PCP follow-up, weight loss,healthy lifestyle   Tobacco use disorder Cessation counseling     DVT prophylaxis: SCDs Start: 09/24/24 2245    Code Status: Full Code Family Communication:   Status is: Inpatient Remains inpatient appropriate because: Hopefully home once cleared bygeneral surgery   PT Follow up Recs:   Subjective:  Feels ok.   Examination:  General exam: Appears calm and comfortable  Respiratory system: Clear to auscultation. Respiratory effort normal. Cardiovascular system: S1 & S2 heard, RRR. No JVD, murmurs, rubs, gallops or clicks. No pedal edema. Gastrointestinal system: Abdomen is nondistended, soft and nontender. No organomegaly or masses felt. Normal bowel sounds heard. Central nervous system: Alert and oriented. No focal neurological deficits. Extremities: Symmetric 5 x 5 power. Skin: No rashes, lesions or ulcers Psychiatry: Judgement and insight appear normal. Mood & affect appropriate. Left axilla drain in place   Discharge Diagnoses:  Principal Problem:   Sepsis [facility])      Discharge Exam: Vitals:   10/03/24 2000 10/04/24 0803  BP: 124/68 116/75  Pulse: 73 86  Resp: 18 16  Temp: 98.9 F (37.2 C) 98.6 F (37 C)  SpO2: 99% 99%   Vitals:   10/03/24 0825 10/03/24 1656 10/03/24 2000 10/04/24 0803  BP: 110/68 115/73 124/68 116/75  Pulse:  90 75 73 86  Resp: 16 16 18 16   Temp: 98.4 F (36.9 C) 98.4 F (36.9 C) 98.9 F (37.2 C) 98.6 F (37 C)  TempSrc: Oral Oral Oral Oral  SpO2: 98% 99% 99% 99%  Weight:      Height:          Discharge Instructions   Allergies as of 10/04/2024       Reactions   Morphine  Palpitations   Porcine (pork) Protein-containing Drug Products Other (See Comments)   Religion         Medication List     TAKE these medications    linezolid 600 MG tablet Commonly known as: ZYVOX Take 1 tablet (600 mg total) by mouth every 12 (twelve) hours for 24 doses.   oxyCODONE  5 MG immediate release tablet Commonly known as: Oxy IR/ROXICODONE  Take 1-2 tablets (5-10 mg total) by mouth every 6 (six) hours as needed. What changed:  how much to take when to take this reasons to take this   polyethylene glycol powder 17 GM/SCOOP powder Commonly known as: GLYCOLAX/MIRALAX Take 17 g by mouth daily as needed for mild constipation or moderate constipation. Dissolve 1 capful (17g) in 4-8 ounces of liquid and take by mouth daily.        Follow-up Information     Aron Shoulders, MD Follow up in 2 week(s).   Specialty: General Surgery Contact information: [address] [place] [address] [place] [address] [phone]         [facility] Follow up on 10/10/2024.   Specialty: General Surgery Why: drain removal Contact information: [address] [place] [address] [phone]                Allergies  Allergen Reactions   Morphine  Palpitations   Porcine (Pork) Protein-Containing Drug Products Other (See Comments)    Religion     You were cared for by a hospitalist during your hospital stay. If you have any questions about your discharge medications or the care you received while you were in the hospital after you are discharged, you can call the unit and asked to speak with the hospitalist on call if the hospitalist that took care of you is not  available. Once you are discharged, your primary care physician will handle any further medical issues. Please note that no refills for any discharge medications will be authorized once you are discharged, as it is imperative that you return to your primary care physician (or establish a relationship with a primary care physician if you do not have one) for your aftercare needs so that they can reassess your need for medications and monitor your lab values.  You were cared for by a hospitalist during your hospital stay. If you have any questions about your discharge medications or the care you received while you were in the hospital after you are discharged, you can call the unit and asked to speak with the hospitalist on call if the hospitalist that took care of you is not available. Once you are discharged, your primary care physician will handle any further medical issues. Please note that NO REFILLS for any discharge medications will be authorized once you are discharged, as it is imperative that you return to your primary care physician (or establish a relationship with a primary care  physician if you do not have one) for your aftercare needs so that they can reassess your need for medications and monitor your lab values.  Please request your Prim.MD to go over all Hospital Tests and Procedure/Radiological results at the follow up, please get all Hospital records sent to your Prim MD by signing hospital release before you go home.  Get CBC, CMP, 2 view Chest X ray checked  by Primary MD during your next visit or SNF MD in 5-7 days ( we routinely change or add medications that can affect your baseline labs and fluid status, therefore we recommend that you get the mentioned basic workup next visit with your PCP, your PCP may decide not to get them or add new tests based on their clinical decision)  On your next visit with your primary care physician please Get Medicines reviewed and adjusted.  If you  experience worsening of your admission symptoms, develop shortness of breath, life threatening emergency, suicidal or homicidal thoughts you must seek medical attention immediately by calling 911 or calling your MD immediately  if symptoms less severe.  You Must read complete instructions/literature along with all the possible adverse reactions/side effects for all the Medicines you take and that have been prescribed to you. Take any new Medicines after you have completely understood and accpet all the possible adverse reactions/side effects.   Do not drive, operate heavy machinery, perform activities at heights, swimming or participation in water activities or provide baby sitting services if your were admitted for syncope or siezures until you have seen by Primary MD or a Neurologist and advised to do so again.  Do not drive when taking Pain medications.   Procedures/Studies: US  AXILLA LEFT Result Date: 09/30/2024 EXAM: US  LEFT UPPER EXTREMITY NONVASCULAR SOFT TISSUE 09/30/2024 07:25:36 PM TECHNIQUE: Real-time ultrasound scan of the left upper extremity with image documentation. COMPARISON: None available. CLINICAL HISTORY: Swelling in left armpit. FINDINGS: SOFT TISSUES: A complex multiloculated fluid collection is seen within the left axilla measuring 6.2 x 2.8 x 8.0 cm demonstrating multiple internal septa and debris. No significant surrounding hyperemia. The superior most and inferior most extent of the collection is not fully included on this examination, however, the collection appears entirely located within the subcutaneous soft tissues. IMPRESSION: 1. Complex multiloculated fluid collection within the left axilla measuring 6.2 x 2.8 x 8.0 cm, with multiple internal septa and debris, located within the subcutaneous soft tissues. No significant surrounding hyperemia. 2. The superior and inferior extents of the collection are not fully included on this examination. Electronically signed by:  Dorethia Molt MD 09/30/2024 09:35 PM EST RP Workstation: [id number]   CT Chest W Contrast Result Date: 09/24/2024 EXAM: CT CHEST WITH CONTRAST 09/24/2024 09:35:42 PM TECHNIQUE: CT of the chest was performed with the administration of 70 mL of iohexol  (OMNIPAQUE ) 300 MG/ML solution. Multiplanar reformatted images are provided for review. Automated exposure control, iterative reconstruction, and/or weight based adjustment of the mA/kV was utilized to reduce the radiation dose to as low as reasonably achievable. COMPARISON: MRI of the breast from 07/21/2024 CLINICAL HISTORY: Chest wall mass, US maximiano nondiagnostic. FINDINGS: MEDIASTINUM: Thoracic aorta appears within normal limits. No cardiac enlargement is noted. Pulmonary artery appears within normal limits. The esophagus is within normal limits. LYMPH NODES: No mediastinal, hilar or axillary lymphadenopathy. LUNGS AND PLEURA: The lungs are well aerated bilaterally. No focal infiltrate. SOFT TISSUES/BONES: Bony structures are within normal limits. Postsurgical changes are noted in the left axilla consistent with the given clinical history.  Generalized edema is noted in the operative site. A surgical drain is seen extending along the left chest wall. There is a soft tissue density which measures approximately 7.0 x 3.8 cm posterior laterally within the left breast likely representing a hematoma given the recent surgical intervention. This is removed from the drainage catheter. UPPER ABDOMEN: Visualized upper abdomen shows changes of prior cholecystectomy. IMPRESSION: 1. Postsurgical changes in the left axilla with generalized edema and a surgical drain extending along the left chest wall. 2. Soft tissue density measuring approximately 7.0 x 3.8 cm posterolaterally within the left breast, likely representing a hematoma given recent surgery and separate from the drainage catheter. Electronically signed by: Oneil Devonshire MD 09/24/2024 09:52 PM EST RP Workstation:  [id number]     The results of significant diagnostics from this hospitalization (including imaging, microbiology, ancillary and laboratory) are listed below for reference.     Microbiology: Recent Results (from the past 240 hours)  Blood Culture (routine x 2)     Status: Abnormal   Collection Time: 09/24/24  8:12 PM   Specimen: BLOOD  Result Value Ref Range Status   Specimen Description   Final    BLOOD RIGHT ANTECUBITAL Performed at [facility], [address], [place], [place] [address]    Special Requests   Final    BOTTLES DRAWN AEROBIC AND ANAEROBIC Blood Culture results may not be optimal due to an inadequate volume of blood received in culture bottles Performed at [facility], [address], [place], [place] [address]    Culture  Setup Time   Final    GRAM POSITIVE COCCI AEROBIC BOTTLE ONLY RBV MITCHELL PHARMD 09/26/2024 0030 BY DD    Culture (A)  Final    STAPHYLOCOCCUS EPIDERMIDIS THE SIGNIFICANCE OF ISOLATING THIS ORGANISM FROM A SINGLE SET OF BLOOD CULTURES WHEN MULTIPLE SETS ARE DRAWN IS UNCERTAIN. PLEASE NOTIFY THE MICROBIOLOGY DEPARTMENT WITHIN ONE WEEK IF SPECIATION AND SENSITIVITIES ARE REQUIRED. Performed at [facility], [address] [place], [place] [address]    Report Status 09/27/2024 FINAL  Final  Blood Culture (routine x 2)     Status: None   Collection Time: 09/24/24  8:12 PM   Specimen: BLOOD  Result Value Ref Range Status   Specimen Description   Final    BLOOD RIGHT ANTECUBITAL Performed at [facility], [address] [place], [place] [address]    Special Requests   Final    BOTTLES DRAWN AEROBIC AND ANAEROBIC Blood Culture results may not be optimal due to an inadequate volume of blood received in culture bottles Performed at [facility], [address], [place], [place] [address]    Culture   Final    NO GROWTH 5 DAYS Performed at [facility], [address] [place], [place] [address]    Report Status 09/29/2024 FINAL  Final  Blood Culture ID Panel (Reflexed)     Status: Abnormal   Collection Time: 09/24/24  8:12 PM  Result Value Ref Range Status   Enterococcus faecalis NOT DETECTED NOT DETECTED Final   Enterococcus Faecium NOT DETECTED NOT DETECTED Final   Listeria monocytogenes NOT DETECTED NOT DETECTED Final   Staphylococcus species DETECTED (A) NOT DETECTED Final    Comment: CRITICAL RESULT CALLED TO, READ BACK BY AND VERIFIED WITH: RBV MITCHELL PHARMD 09/26/2024 0030 BY DD    Staphylococcus aureus (BCID) NOT DETECTED NOT DETECTED Final   Staphylococcus epidermidis DETECTED (A) NOT  DETECTED Final    Comment: CRITICAL RESULT CALLED TO, READ BACK BY AND VERIFIED WITH: RBV MITCHELL PHARMD 09/26/2024 0030 BY DD    Staphylococcus lugdunensis NOT DETECTED NOT DETECTED Final   Streptococcus species NOT DETECTED NOT DETECTED Final   Streptococcus agalactiae NOT DETECTED NOT DETECTED Final   Streptococcus pneumoniae NOT DETECTED NOT DETECTED Final   Streptococcus pyogenes NOT DETECTED NOT DETECTED Final   A.calcoaceticus-baumannii NOT DETECTED NOT DETECTED Final   Bacteroides fragilis NOT DETECTED NOT DETECTED Final   Enterobacterales NOT DETECTED NOT DETECTED Final   Enterobacter cloacae complex NOT DETECTED NOT DETECTED Final   Escherichia coli NOT DETECTED NOT DETECTED Final   Klebsiella aerogenes NOT DETECTED NOT DETECTED Final   Klebsiella oxytoca NOT DETECTED NOT DETECTED Final   Klebsiella pneumoniae NOT DETECTED NOT DETECTED Final   Proteus species NOT DETECTED NOT DETECTED Final   Salmonella species NOT DETECTED NOT DETECTED Final   Serratia marcescens NOT DETECTED NOT DETECTED Final   Haemophilus influenzae NOT DETECTED NOT DETECTED Final   Neisseria meningitidis NOT DETECTED NOT DETECTED Final   Pseudomonas aeruginosa NOT DETECTED NOT DETECTED Final   Stenotrophomonas maltophilia NOT DETECTED NOT DETECTED Final    Candida albicans NOT DETECTED NOT DETECTED Final   Candida auris NOT DETECTED NOT DETECTED Final   Candida glabrata NOT DETECTED NOT DETECTED Final   Candida krusei NOT DETECTED NOT DETECTED Final   Candida parapsilosis NOT DETECTED NOT DETECTED Final   Candida tropicalis NOT DETECTED NOT DETECTED Final   Cryptococcus neoformans/gattii NOT DETECTED NOT DETECTED Final   Methicillin resistance mecA/C NOT DETECTED NOT DETECTED Final    Comment: Performed at [facility], [address] [place], [place] [address]  Resp panel by RT-PCR (RSV, Flu A&B, Covid) Anterior Nasal Swab     Status: None   Collection Time: 09/24/24  8:44 PM   Specimen: Anterior Nasal Swab  Result Value Ref Range Status   SARS Coronavirus 2 by RT PCR NEGATIVE NEGATIVE Final    Comment: (NOTE) SARS-CoV-2 target nucleic acids are NOT DETECTED.  The SARS-CoV-2 RNA is generally detectable in upper respiratory specimens during the acute phase of infection. The lowest concentration of SARS-CoV-2 viral copies this assay can detect is 138 copies/mL. A negative result does not preclude SARS-Cov-2 infection and should not be used as the sole basis for treatment or other patient management decisions. A negative result may occur with  improper specimen collection/handling, submission of specimen other than nasopharyngeal swab, presence of viral mutation(s) within the areas targeted by this assay, and inadequate number of viral copies(<138 copies/mL). A negative result must be combined with clinical observations, patient history, and epidemiological information. The expected result is Negative.  Fact Sheet for Patients:  bloggercourse.com  Fact Sheet for Healthcare Providers:  seriousbroker.it  This test is no t yet approved or cleared by the [place]  FDA and  has been authorized for detection and/or diagnosis of SARS-CoV-2 by FDA under an Emergency Use  Authorization (EUA). This EUA will remain  in effect (meaning this test can be used) for the duration of the COVID-19 declaration under Section 564(b)(1) of the Act, 21 U.S.C.section 360bbb-3(b)(1), unless the authorization is terminated  or revoked sooner.       Influenza A by PCR NEGATIVE NEGATIVE Final   Influenza B by PCR NEGATIVE NEGATIVE Final    Comment: (NOTE) The Xpert Xpress SARS-CoV-2/FLU/RSV plus assay is intended as an aid in the diagnosis of influenza from Nasopharyngeal swab specimens and  should not be used as a sole basis for treatment. Nasal washings and aspirates are unacceptable for Xpert Xpress SARS-CoV-2/FLU/RSV testing.  Fact Sheet for Patients: bloggercourse.com  Fact Sheet for Healthcare Providers: seriousbroker.it  This test is not yet approved or cleared by the [place]  FDA and has been authorized for detection and/or diagnosis of SARS-CoV-2 by FDA under an Emergency Use Authorization (EUA). This EUA will remain in effect (meaning this test can be used) for the duration of the COVID-19 declaration under Section 564(b)(1) of the Act, 21 U.S.C. section 360bbb-3(b)(1), unless the authorization is terminated or revoked.     Resp Syncytial Virus by PCR NEGATIVE NEGATIVE Final    Comment: (NOTE) Fact Sheet for Patients: bloggercourse.com  Fact Sheet for Healthcare Providers: seriousbroker.it  This test is not yet approved or cleared by the [place]  FDA and has been authorized for detection and/or diagnosis of SARS-CoV-2 by FDA under an Emergency Use Authorization (EUA). This EUA will remain in effect (meaning this test can be used) for the duration of the COVID-19 declaration under Section 564(b)(1) of the Act, 21 U.S.C. section 360bbb-3(b)(1), unless the authorization is terminated or revoked.  Performed at [facility],  [address] [place], [place] [address]   Aerobic/Anaerobic Culture w Gram Stain (surgical/deep wound)     Status: None   Collection Time: 09/28/24  3:21 PM   Specimen: Arm, Left; Abscess  Result Value Ref Range Status   Specimen Description   Final    ABSCESS Performed at [facility], [address] [place], [place] [address]    Special Requests   Final    LITTIE BIENENSTOCK Performed at [facility], [address], [place], [place] [address]    Gram Stain NO WBC SEEN NO ORGANISMS SEEN   Final   Culture   Final    MODERATE METHICILLIN RESISTANT STAPHYLOCOCCUS AUREUS NO ANAEROBES ISOLATED Performed at [facility], [address], [place], [place] [address]    Report Status 10/03/2024 FINAL  Final   Organism ID, Bacteria METHICILLIN RESISTANT STAPHYLOCOCCUS AUREUS  Final      Susceptibility   Methicillin resistant staphylococcus aureus - MIC*    CIPROFLOXACIN <=0.5 SENSITIVE Sensitive     ERYTHROMYCIN >=8 RESISTANT Resistant     GENTAMICIN <=0.5 SENSITIVE Sensitive     OXACILLIN >=4 RESISTANT Resistant     TETRACYCLINE >=16 RESISTANT Resistant     VANCOMYCIN 1 SENSITIVE Sensitive     TRIMETH/SULFA <=10 SENSITIVE Sensitive     CLINDAMYCIN RESISTANT Resistant     RIFAMPIN <=0.5 SENSITIVE Sensitive     Inducible Clindamycin POSITIVE Resistant     LINEZOLID 2 SENSITIVE Sensitive     * MODERATE METHICILLIN RESISTANT STAPHYLOCOCCUS AUREUS  Aerobic/Anaerobic Culture w Gram Stain (surgical/deep wound)     Status: None   Collection Time: 09/28/24  3:24 PM   Specimen: Arm, Left; Abscess  Result Value Ref Range Status   Specimen Description   Final    ABSCESS Performed at [facility], [address], [place], [place] [address]    Special Requests   Final    L BREAST SEROMA Performed at [facility], [address] [place], [place] [address]    Gram Stain   Final    RARE WBC PRESENT, PREDOMINANTLY  MONONUCLEAR NO ORGANISMS SEEN    Culture   Final    No growth aerobically or anaerobically. Performed at [facility], [address] [place], [place] [address]    Report Status 10/03/2024 FINAL  Final     Labs: BNP (last 3 results) No results for input(s): BNP in the last 8760 hours. Basic Metabolic Panel: Recent Labs  Lab 09/28/24 0410 09/29/24 0417 09/30/24 1125 10/03/24 0600 10/04/24 0509  NA 134* 136 139 138 136  K 3.9 4.1 3.8 4.2 5.0  CL 100 101 103 105 100  CO2 22 25 24 22 23   GLUCOSE 94 135* 85 110* 113*  BUN 8 12 9 9 14   CREATININE 0.51 0.48 0.51 0.63 0.79  CALCIUM 8.7* 9.1 9.3 8.9 9.3  MG  --   --   --   --  2.0   Liver Function Tests: No results for input(s): AST, ALT, ALKPHOS, BILITOT, PROT, ALBUMIN in the last 168 hours. No results for input(s): LIPASE, AMYLASE in the last 168 hours. No results for input(s): AMMONIA in the last 168 hours. CBC: Recent Labs  Lab 09/28/24 0410 09/29/24 0417 09/30/24 1125 10/03/24 0600 10/04/24 0509  WBC 11.0* 7.2 6.7 5.8 6.6  HGB 11.2* 11.5* 11.6* 12.1 12.9  HCT 34.3* 36.1 36.7 38.4 40.9  MCV 78.1* 78.1* 78.9* 78.9* 79.3*  PLT 404* 439* 446* 398 436*   Cardiac Enzymes: No results for input(s): CKTOTAL, CKMB, CKMBINDEX, TROPONINI in the last 168 hours. BNP: Invalid input(s): POCBNP CBG: No results for input(s): GLUCAP in the last 168 hours. D-Dimer No results for input(s): DDIMER in the last 72 hours. Hgb A1c No results for input(s): HGBA1C in the last 72 hours. Lipid Profile No results for input(s): CHOL, HDL, LDLCALC, TRIG, CHOLHDL, LDLDIRECT in the last 72 hours. Thyroid function studies No results for input(s): TSH, T4TOTAL, T3FREE, THYROIDAB in the last 72 hours.  Invalid input(s): FREET3 Anemia work up No results for input(s): VITAMINB12, FOLATE, FERRITIN, TIBC, IRON, RETICCTPCT in the last 72 hours. Urinalysis    Component  Value Date/Time   COLORURINE STRAW (A) 09/24/2024 2044   APPEARANCEUR CLEAR 09/24/2024 2044   LABSPEC 1.004 (L) 09/24/2024 2044   PHURINE 5.0 09/24/2024 2044   GLUCOSEU NEGATIVE 09/24/2024 2044   HGBUR SMALL (A) 09/24/2024 2044   BILIRUBINUR NEGATIVE 09/24/2024 2044   KETONESUR NEGATIVE 09/24/2024 2044   PROTEINUR NEGATIVE 09/24/2024 2044   NITRITE NEGATIVE 09/24/2024 2044   LEUKOCYTESUR TRACE (A) 09/24/2024 2044   Sepsis Labs Recent Labs  Lab 09/29/24 0417 09/30/24 1125 10/03/24 0600 10/04/24 0509  WBC 7.2 6.7 5.8 6.6   Microbiology Recent Results (from the past 240 hours)  Blood Culture (routine x 2)     Status: Abnormal   Collection Time: 09/24/24  8:12 PM   Specimen: BLOOD  Result Value Ref Range Status   Specimen Description   Final    BLOOD RIGHT ANTECUBITAL Performed at [facility], [address], [place], [place] [address]    Special Requests   Final    BOTTLES DRAWN AEROBIC AND ANAEROBIC Blood Culture results may not be optimal due to an inadequate volume of blood received in culture bottles Performed at [facility], [address], [place], [place] [address]    Culture  Setup Time   Final    GRAM POSITIVE COCCI AEROBIC BOTTLE ONLY RBV MITCHELL PHARMD 09/26/2024 0030 BY DD    Culture (A)  Final    STAPHYLOCOCCUS EPIDERMIDIS THE SIGNIFICANCE OF ISOLATING THIS ORGANISM FROM A SINGLE SET OF BLOOD CULTURES WHEN MULTIPLE SETS ARE DRAWN IS UNCERTAIN. PLEASE NOTIFY THE MICROBIOLOGY DEPARTMENT WITHIN  ONE WEEK IF SPECIATION AND SENSITIVITIES ARE REQUIRED. Performed at [facility], [address], [place], [place] [address]    Report Status 09/27/2024 FINAL  Final  Blood Culture (routine x 2)     Status: None   Collection Time: 09/24/24  8:12 PM   Specimen: BLOOD  Result Value Ref Range Status   Specimen Description   Final    BLOOD RIGHT ANTECUBITAL Performed at [facility], [address],  [place], [place] [address]    Special Requests   Final    BOTTLES DRAWN AEROBIC AND ANAEROBIC Blood Culture results may not be optimal due to an inadequate volume of blood received in culture bottles Performed at [facility], [address] [place], [place] [address]    Culture   Final    NO GROWTH 5 DAYS Performed at [facility], [address] [place], [place] [address]    Report Status 09/29/2024 FINAL  Final  Blood Culture ID Panel (Reflexed)     Status: Abnormal   Collection Time: 09/24/24  8:12 PM  Result Value Ref Range Status   Enterococcus faecalis NOT DETECTED NOT DETECTED Final   Enterococcus Faecium NOT DETECTED NOT DETECTED Final   Listeria monocytogenes NOT DETECTED NOT DETECTED Final   Staphylococcus species DETECTED (A) NOT DETECTED Final    Comment: CRITICAL RESULT CALLED TO, READ BACK BY AND VERIFIED WITH: RBV MITCHELL PHARMD 09/26/2024 0030 BY DD    Staphylococcus aureus (BCID) NOT DETECTED NOT DETECTED Final   Staphylococcus epidermidis DETECTED (A) NOT DETECTED Final    Comment: CRITICAL RESULT CALLED TO, READ BACK BY AND VERIFIED WITH: RBV MITCHELL PHARMD 09/26/2024 0030 BY DD    Staphylococcus lugdunensis NOT DETECTED NOT DETECTED Final   Streptococcus species NOT DETECTED NOT DETECTED Final   Streptococcus agalactiae NOT DETECTED NOT DETECTED Final   Streptococcus pneumoniae NOT DETECTED NOT DETECTED Final   Streptococcus pyogenes NOT DETECTED NOT DETECTED Final   A.calcoaceticus-baumannii NOT DETECTED NOT DETECTED Final   Bacteroides fragilis NOT DETECTED NOT DETECTED Final   Enterobacterales NOT DETECTED NOT DETECTED Final   Enterobacter cloacae complex NOT DETECTED NOT DETECTED Final   Escherichia coli NOT DETECTED NOT DETECTED Final   Klebsiella aerogenes NOT DETECTED NOT DETECTED Final   Klebsiella oxytoca NOT DETECTED NOT DETECTED Final   Klebsiella pneumoniae NOT DETECTED NOT DETECTED Final   Proteus species NOT DETECTED NOT  DETECTED Final   Salmonella species NOT DETECTED NOT DETECTED Final   Serratia marcescens NOT DETECTED NOT DETECTED Final   Haemophilus influenzae NOT DETECTED NOT DETECTED Final   Neisseria meningitidis NOT DETECTED NOT DETECTED Final   Pseudomonas aeruginosa NOT DETECTED NOT DETECTED Final   Stenotrophomonas maltophilia NOT DETECTED NOT DETECTED Final   Candida albicans NOT DETECTED NOT DETECTED Final   Candida auris NOT DETECTED NOT DETECTED Final   Candida glabrata NOT DETECTED NOT DETECTED Final   Candida krusei NOT DETECTED NOT DETECTED Final   Candida parapsilosis NOT DETECTED NOT DETECTED Final   Candida tropicalis NOT DETECTED NOT DETECTED Final   Cryptococcus neoformans/gattii NOT DETECTED NOT DETECTED Final   Methicillin resistance mecA/C NOT DETECTED NOT DETECTED Final    Comment: Performed at [facility], [address] [place], [place] [address]  Resp panel by RT-PCR (RSV, Flu A&B, Covid) Anterior Nasal Swab     Status: None   Collection Time: 09/24/24  8:44 PM   Specimen: Anterior Nasal Swab  Result Value  Ref Range Status   SARS Coronavirus 2 by RT PCR NEGATIVE NEGATIVE Final    Comment: (NOTE) SARS-CoV-2 target nucleic acids are NOT DETECTED.  The SARS-CoV-2 RNA is generally detectable in upper respiratory specimens during the acute phase of infection. The lowest concentration of SARS-CoV-2 viral copies this assay can detect is 138 copies/mL. A negative result does not preclude SARS-Cov-2 infection and should not be used as the sole basis for treatment or other patient management decisions. A negative result may occur with  improper specimen collection/handling, submission of specimen other than nasopharyngeal swab, presence of viral mutation(s) within the areas targeted by this assay, and inadequate number of viral copies(<138 copies/mL). A negative result must be combined with clinical observations, patient history, and epidemiological information. The  expected result is Negative.  Fact Sheet for Patients:  bloggercourse.com  Fact Sheet for Healthcare Providers:  seriousbroker.it  This test is no t yet approved or cleared by the [place]  FDA and  has been authorized for detection and/or diagnosis of SARS-CoV-2 by FDA under an Emergency Use Authorization (EUA). This EUA will remain  in effect (meaning this test can be used) for the duration of the COVID-19 declaration under Section 564(b)(1) of the Act, 21 U.S.C.section 360bbb-3(b)(1), unless the authorization is terminated  or revoked sooner.       Influenza A by PCR NEGATIVE NEGATIVE Final   Influenza B by PCR NEGATIVE NEGATIVE Final    Comment: (NOTE) The Xpert Xpress SARS-CoV-2/FLU/RSV plus assay is intended as an aid in the diagnosis of influenza from Nasopharyngeal swab specimens and should not be used as a sole basis for treatment. Nasal washings and aspirates are unacceptable for Xpert Xpress SARS-CoV-2/FLU/RSV testing.  Fact Sheet for Patients: bloggercourse.com  Fact Sheet for Healthcare Providers: seriousbroker.it  This test is not yet approved or cleared by the [place]  FDA and has been authorized for detection and/or diagnosis of SARS-CoV-2 by FDA under an Emergency Use Authorization (EUA). This EUA will remain in effect (meaning this test can be used) for the duration of the COVID-19 declaration under Section 564(b)(1) of the Act, 21 U.S.C. section 360bbb-3(b)(1), unless the authorization is terminated or revoked.     Resp Syncytial Virus by PCR NEGATIVE NEGATIVE Final    Comment: (NOTE) Fact Sheet for Patients: bloggercourse.com  Fact Sheet for Healthcare Providers: seriousbroker.it  This test is not yet approved or cleared by the [place]  FDA and has been authorized for detection and/or  diagnosis of SARS-CoV-2 by FDA under an Emergency Use Authorization (EUA). This EUA will remain in effect (meaning this test can be used) for the duration of the COVID-19 declaration under Section 564(b)(1) of the Act, 21 U.S.C. section 360bbb-3(b)(1), unless the authorization is terminated or revoked.  Performed at [facility], [address] [place], [place] [address]   Aerobic/Anaerobic Culture w Gram Stain (surgical/deep wound)     Status: None   Collection Time: 09/28/24  3:21 PM   Specimen: Arm, Left; Abscess  Result Value Ref Range Status   Specimen Description   Final    ABSCESS Performed at [facility], [address], [place], [place] [address]    Special Requests   Final    LITTIE BIENENSTOCK Performed at [facility], [address], [place], [place] [address]    Gram Stain NO WBC SEEN NO ORGANISMS SEEN   Final   Culture   Final    MODERATE METHICILLIN RESISTANT STAPHYLOCOCCUS AUREUS NO ANAEROBES  ISOLATED Performed at [facility], [address], [place], [place] [address]    Report Status 10/03/2024 FINAL  Final   Organism ID, Bacteria METHICILLIN RESISTANT STAPHYLOCOCCUS AUREUS  Final      Susceptibility   Methicillin resistant staphylococcus aureus - MIC*    CIPROFLOXACIN <=0.5 SENSITIVE Sensitive     ERYTHROMYCIN >=8 RESISTANT Resistant     GENTAMICIN <=0.5 SENSITIVE Sensitive     OXACILLIN >=4 RESISTANT Resistant     TETRACYCLINE >=16 RESISTANT Resistant     VANCOMYCIN 1 SENSITIVE Sensitive     TRIMETH/SULFA <=10 SENSITIVE Sensitive     CLINDAMYCIN RESISTANT Resistant     RIFAMPIN <=0.5 SENSITIVE Sensitive     Inducible Clindamycin POSITIVE Resistant     LINEZOLID 2 SENSITIVE Sensitive     * MODERATE METHICILLIN RESISTANT STAPHYLOCOCCUS AUREUS  Aerobic/Anaerobic Culture w Gram Stain (surgical/deep wound)     Status: None   Collection Time: 09/28/24  3:24 PM   Specimen: Arm, Left; Abscess  Result  Value Ref Range Status   Specimen Description   Final    ABSCESS Performed at [facility], [address], [place], [place] [address]    Special Requests   Final    L BREAST SEROMA Performed at [facility], [address], [place], [place] [address]    Gram Stain   Final    RARE WBC PRESENT, PREDOMINANTLY MONONUCLEAR NO ORGANISMS SEEN    Culture   Final    No growth aerobically or anaerobically. Performed at [facility], [address] [place], [place] [address]    Report Status 10/03/2024 FINAL  Final     Time coordinating discharge:  I have spent 35 minutes face to face with the patient and on the ward discussing the patients care, assessment, plan and disposition with other care givers. >50% of the time was devoted counseling the patient about the risks and benefits of treatment/Discharge disposition and coordinating care.   SIGNED:   Burgess JAYSON Dare, MD  [facility] 10/04/2024, 2:46 PM   If 7PM-7AM, please contact night-coverage

## 2024-10-04 NOTE — Progress Notes (Signed)
 PROGRESS NOTE    Wanda Garcia  FMW:[id number] DOB: 05-10-1976 DOA: 09/24/2024 PCP: [facility]    Brief Narrative:  a 48 y.o. female with PMH of  for recently diagnosed left breast cancer 03/22/2024 status post chemotherapy (last chemotherapy was about 2 months ago-was stopped prematurely due to chemo induced neuropathy), anxiety, status post left axillary lymphadenopathy on 09/14/2024 with drain, status post port site removal, who presents to the ER sent from urgent care with concern for possible port site infection, generalized weakness and fatigue, nausea, and vomiting.  Patient was found to have sepsis secondary to left base cellulitis/drain site infection.  Had a left breast cancer status postlumpectomy, targeted sentinel node 10/7.  Recently underwent left axillary lymphadenectomy with drain status post port removal on 10/29.  Had left axillary washout on 09/28/2024.  In the hospitalization she was started on linezolid which she was tolerating well without any issues.  She did have a drain on the left side placed recently which is being managed by general surgery.  Will discharge her once cleared by general surgery  Assessment & Plan:    Severe sepsis POA from left base cellulitis/suspected drain site infection, port site infection, POA Left breast cancer s/p lumpectomy, targeted/sentinel node 10/7 and then completion ALND 10/29 Hematoma on the left breast Rule out bacteremia - Recent left axillary lymphadenopathy on 09/14/2024 with drain, s/p recent port site removal.  Left axillary washout 10/12.  Seen by ID, plans for 2 weeks of linezolid and outpatient follow-up -Pain control   Suspected red man syndrome after IV vancomycin infusion Plans for 2 weeks of linezolid and outpatient follow-up with ID    Left breast cancer status postchemotherapy Chemo-induced polyneuropathy Chemotherapy was interrupted, about 2 months ago, due to chemo induced polyneuropathy.F/b  Dr. Odean   Anemia likely multifactorial from acute illness and malignancy: Hemoglobin stable.  Monitor   Class I Obesity w/ Body mass index is 34.4 kg/m.: Will benefit with PCP follow-up, weight loss,healthy lifestyle   Tobacco use disorder Cessation counseling     DVT prophylaxis: SCDs Start: 09/24/24 2245    Code Status: Full Code Family Communication:   Status is: Inpatient Remains inpatient appropriate because: Hopefully home once cleared bygeneral surgery   PT Follow up Recs:   Subjective:  Feels ok.   Examination:  General exam: Appears calm and comfortable  Respiratory system: Clear to auscultation. Respiratory effort normal. Cardiovascular system: S1 & S2 heard, RRR. No JVD, murmurs, rubs, gallops or clicks. No pedal edema. Gastrointestinal system: Abdomen is nondistended, soft and nontender. No organomegaly or masses felt. Normal bowel sounds heard. Central nervous system: Alert and oriented. No focal neurological deficits. Extremities: Symmetric 5 x 5 power. Skin: No rashes, lesions or ulcers Psychiatry: Judgement and insight appear normal. Mood & affect appropriate. Left axilla drain in place               Diet Orders (From admission, onward)     Start     Ordered   09/28/24 1718  Diet regular Room service appropriate? Yes; Fluid consistency: Thin  Diet effective now       Question Answer Comment  Room service appropriate? Yes   Fluid consistency: Thin      09/28/24 1717            Objective: Vitals:   10/03/24 0825 10/03/24 1656 10/03/24 2000 10/04/24 0803  BP: 110/68 115/73 124/68 116/75  Pulse: 90 75 73 86  Resp: 16 16 18 16   Temp:  98.4 F (36.9 C) 98.4 F (36.9 C) 98.9 F (37.2 C) 98.6 F (37 C)  TempSrc: Oral Oral Oral Oral  SpO2: 98% 99% 99% 99%  Weight:      Height:        Intake/Output Summary (Last 24 hours) at 10/04/2024 1053 Last data filed at 10/04/2024 0600 Gross per 24 hour  Intake --  Output 20 ml  Net  -20 ml   Filed Weights   09/25/24 0214 09/28/24 1311  Weight: 79.9 kg 79.9 kg    Scheduled Meds:  linezolid  600 mg Oral Q12H   Continuous Infusions:  Nutritional status     Body mass index is 34.4 kg/m.  Data Reviewed:   CBC: Recent Labs  Lab 09/28/24 0410 09/29/24 0417 09/30/24 1125 10/03/24 0600 10/04/24 0509  WBC 11.0* 7.2 6.7 5.8 6.6  HGB 11.2* 11.5* 11.6* 12.1 12.9  HCT 34.3* 36.1 36.7 38.4 40.9  MCV 78.1* 78.1* 78.9* 78.9* 79.3*  PLT 404* 439* 446* 398 436*   Basic Metabolic Panel: Recent Labs  Lab 09/28/24 0410 09/29/24 0417 09/30/24 1125 10/03/24 0600 10/04/24 0509  NA 134* 136 139 138 136  K 3.9 4.1 3.8 4.2 5.0  CL 100 101 103 105 100  CO2 22 25 24 22 23   GLUCOSE 94 135* 85 110* 113*  BUN 8 12 9 9 14   CREATININE 0.51 0.48 0.51 0.63 0.79  CALCIUM 8.7* 9.1 9.3 8.9 9.3  MG  --   --   --   --  2.0   GFR: Estimated Creatinine Clearance: 79.6 mL/min (by C-G formula based on SCr of 0.79 mg/dL). Liver Function Tests: No results for input(s): AST, ALT, ALKPHOS, BILITOT, PROT, ALBUMIN in the last 168 hours. No results for input(s): LIPASE, AMYLASE in the last 168 hours. No results for input(s): AMMONIA in the last 168 hours. Coagulation Profile: No results for input(s): INR, PROTIME in the last 168 hours. Cardiac Enzymes: No results for input(s): CKTOTAL, CKMB, CKMBINDEX, TROPONINI in the last 168 hours. BNP (last 3 results) No results for input(s): PROBNP in the last 8760 hours. HbA1C: No results for input(s): HGBA1C in the last 72 hours. CBG: No results for input(s): GLUCAP in the last 168 hours. Lipid Profile: No results for input(s): CHOL, HDL, LDLCALC, TRIG, CHOLHDL, LDLDIRECT in the last 72 hours. Thyroid Function Tests: No results for input(s): TSH, T4TOTAL, FREET4, T3FREE, THYROIDAB in the last 72 hours. Anemia Panel: No results for input(s): VITAMINB12, FOLATE, FERRITIN,  TIBC, IRON, RETICCTPCT in the last 72 hours. Sepsis Labs: No results for input(s): PROCALCITON, LATICACIDVEN in the last 168 hours.  Recent Results (from the past 240 hours)  Blood Culture (routine x 2)     Status: Abnormal   Collection Time: 09/24/24  8:12 PM   Specimen: BLOOD  Result Value Ref Range Status   Specimen Description   Final    BLOOD RIGHT ANTECUBITAL Performed at [facility], [address] [place], [place] [address]    Special Requests   Final    BOTTLES DRAWN AEROBIC AND ANAEROBIC Blood Culture results may not be optimal due to an inadequate volume of blood received in culture bottles Performed at [facility], [address], [place], [place] [address]    Culture  Setup Time   Final    GRAM POSITIVE COCCI AEROBIC BOTTLE ONLY RBV MITCHELL PHARMD 09/26/2024 0030 BY DD    Culture (A)  Final    STAPHYLOCOCCUS EPIDERMIDIS THE SIGNIFICANCE OF  ISOLATING THIS ORGANISM FROM A SINGLE SET OF BLOOD CULTURES WHEN MULTIPLE SETS ARE DRAWN IS UNCERTAIN. PLEASE NOTIFY THE MICROBIOLOGY DEPARTMENT WITHIN ONE WEEK IF SPECIATION AND SENSITIVITIES ARE REQUIRED. Performed at [facility], [address] [place], [place] [address]    Report Status 09/27/2024 FINAL  Final  Blood Culture (routine x 2)     Status: None   Collection Time: 09/24/24  8:12 PM   Specimen: BLOOD  Result Value Ref Range Status   Specimen Description   Final    BLOOD RIGHT ANTECUBITAL Performed at [facility], [address] [place], [place] [address]    Special Requests   Final    BOTTLES DRAWN AEROBIC AND ANAEROBIC Blood Culture results may not be optimal due to an inadequate volume of blood received in culture bottles Performed at [facility], [address], [place], [place] [address]    Culture   Final    NO GROWTH 5 DAYS Performed at [facility], [address], [place], [place] [address]     Report Status 09/29/2024 FINAL  Final  Blood Culture ID Panel (Reflexed)     Status: Abnormal   Collection Time: 09/24/24  8:12 PM  Result Value Ref Range Status   Enterococcus faecalis NOT DETECTED NOT DETECTED Final   Enterococcus Faecium NOT DETECTED NOT DETECTED Final   Listeria monocytogenes NOT DETECTED NOT DETECTED Final   Staphylococcus species DETECTED (A) NOT DETECTED Final    Comment: CRITICAL RESULT CALLED TO, READ BACK BY AND VERIFIED WITH: RBV MITCHELL PHARMD 09/26/2024 0030 BY DD    Staphylococcus aureus (BCID) NOT DETECTED NOT DETECTED Final   Staphylococcus epidermidis DETECTED (A) NOT DETECTED Final    Comment: CRITICAL RESULT CALLED TO, READ BACK BY AND VERIFIED WITH: RBV MITCHELL PHARMD 09/26/2024 0030 BY DD    Staphylococcus lugdunensis NOT DETECTED NOT DETECTED Final   Streptococcus species NOT DETECTED NOT DETECTED Final   Streptococcus agalactiae NOT DETECTED NOT DETECTED Final   Streptococcus pneumoniae NOT DETECTED NOT DETECTED Final   Streptococcus pyogenes NOT DETECTED NOT DETECTED Final   A.calcoaceticus-baumannii NOT DETECTED NOT DETECTED Final   Bacteroides fragilis NOT DETECTED NOT DETECTED Final   Enterobacterales NOT DETECTED NOT DETECTED Final   Enterobacter cloacae complex NOT DETECTED NOT DETECTED Final   Escherichia coli NOT DETECTED NOT DETECTED Final   Klebsiella aerogenes NOT DETECTED NOT DETECTED Final   Klebsiella oxytoca NOT DETECTED NOT DETECTED Final   Klebsiella pneumoniae NOT DETECTED NOT DETECTED Final   Proteus species NOT DETECTED NOT DETECTED Final   Salmonella species NOT DETECTED NOT DETECTED Final   Serratia marcescens NOT DETECTED NOT DETECTED Final   Haemophilus influenzae NOT DETECTED NOT DETECTED Final   Neisseria meningitidis NOT DETECTED NOT DETECTED Final   Pseudomonas aeruginosa NOT DETECTED NOT DETECTED Final   Stenotrophomonas maltophilia NOT DETECTED NOT DETECTED Final   Candida albicans NOT DETECTED NOT DETECTED  Final   Candida auris NOT DETECTED NOT DETECTED Final   Candida glabrata NOT DETECTED NOT DETECTED Final   Candida krusei NOT DETECTED NOT DETECTED Final   Candida parapsilosis NOT DETECTED NOT DETECTED Final   Candida tropicalis NOT DETECTED NOT DETECTED Final   Cryptococcus neoformans/gattii NOT DETECTED NOT DETECTED Final   Methicillin resistance mecA/C NOT DETECTED NOT DETECTED Final    Comment: Performed at [facility], [address], [place], [place] [address]  Resp panel by RT-PCR (RSV, Flu A&B, Covid) Anterior Nasal Swab  Status: None   Collection Time: 09/24/24  8:44 PM   Specimen: Anterior Nasal Swab  Result Value Ref Range Status   SARS Coronavirus 2 by RT PCR NEGATIVE NEGATIVE Final    Comment: (NOTE) SARS-CoV-2 target nucleic acids are NOT DETECTED.  The SARS-CoV-2 RNA is generally detectable in upper respiratory specimens during the acute phase of infection. The lowest concentration of SARS-CoV-2 viral copies this assay can detect is 138 copies/mL. A negative result does not preclude SARS-Cov-2 infection and should not be used as the sole basis for treatment or other patient management decisions. A negative result may occur with  improper specimen collection/handling, submission of specimen other than nasopharyngeal swab, presence of viral mutation(s) within the areas targeted by this assay, and inadequate number of viral copies(<138 copies/mL). A negative result must be combined with clinical observations, patient history, and epidemiological information. The expected result is Negative.  Fact Sheet for Patients:  bloggercourse.com  Fact Sheet for Healthcare Providers:  seriousbroker.it  This test is no t yet approved or cleared by the [place]  FDA and  has been authorized for detection and/or diagnosis of SARS-CoV-2 by FDA under an Emergency Use Authorization (EUA). This EUA will remain  in effect  (meaning this test can be used) for the duration of the COVID-19 declaration under Section 564(b)(1) of the Act, 21 U.S.C.section 360bbb-3(b)(1), unless the authorization is terminated  or revoked sooner.       Influenza A by PCR NEGATIVE NEGATIVE Final   Influenza B by PCR NEGATIVE NEGATIVE Final    Comment: (NOTE) The Xpert Xpress SARS-CoV-2/FLU/RSV plus assay is intended as an aid in the diagnosis of influenza from Nasopharyngeal swab specimens and should not be used as a sole basis for treatment. Nasal washings and aspirates are unacceptable for Xpert Xpress SARS-CoV-2/FLU/RSV testing.  Fact Sheet for Patients: bloggercourse.com  Fact Sheet for Healthcare Providers: seriousbroker.it  This test is not yet approved or cleared by the [place]  FDA and has been authorized for detection and/or diagnosis of SARS-CoV-2 by FDA under an Emergency Use Authorization (EUA). This EUA will remain in effect (meaning this test can be used) for the duration of the COVID-19 declaration under Section 564(b)(1) of the Act, 21 U.S.C. section 360bbb-3(b)(1), unless the authorization is terminated or revoked.     Resp Syncytial Virus by PCR NEGATIVE NEGATIVE Final    Comment: (NOTE) Fact Sheet for Patients: bloggercourse.com  Fact Sheet for Healthcare Providers: seriousbroker.it  This test is not yet approved or cleared by the [place]  FDA and has been authorized for detection and/or diagnosis of SARS-CoV-2 by FDA under an Emergency Use Authorization (EUA). This EUA will remain in effect (meaning this test can be used) for the duration of the COVID-19 declaration under Section 564(b)(1) of the Act, 21 U.S.C. section 360bbb-3(b)(1), unless the authorization is terminated or revoked.  Performed at [facility], [address] [place], [place] [address]    Aerobic/Anaerobic Culture w Gram Stain (surgical/deep wound)     Status: None   Collection Time: 09/28/24  3:21 PM   Specimen: Arm, Left; Abscess  Result Value Ref Range Status   Specimen Description   Final    ABSCESS Performed at [facility], [address] [place], [place] [address]    Special Requests   Final    LITTIE BIENENSTOCK Performed at [facility], [address], [place], [place] [address]    Gram Stain NO WBC SEEN NO ORGANISMS SEEN  Final   Culture   Final    MODERATE METHICILLIN RESISTANT STAPHYLOCOCCUS AUREUS NO ANAEROBES ISOLATED Performed at [facility], [address] [place], [place] [address]    Report Status 10/03/2024 FINAL  Final   Organism ID, Bacteria METHICILLIN RESISTANT STAPHYLOCOCCUS AUREUS  Final      Susceptibility   Methicillin resistant staphylococcus aureus - MIC*    CIPROFLOXACIN <=0.5 SENSITIVE Sensitive     ERYTHROMYCIN >=8 RESISTANT Resistant     GENTAMICIN <=0.5 SENSITIVE Sensitive     OXACILLIN >=4 RESISTANT Resistant     TETRACYCLINE >=16 RESISTANT Resistant     VANCOMYCIN 1 SENSITIVE Sensitive     TRIMETH/SULFA <=10 SENSITIVE Sensitive     CLINDAMYCIN RESISTANT Resistant     RIFAMPIN <=0.5 SENSITIVE Sensitive     Inducible Clindamycin POSITIVE Resistant     LINEZOLID 2 SENSITIVE Sensitive     * MODERATE METHICILLIN RESISTANT STAPHYLOCOCCUS AUREUS  Aerobic/Anaerobic Culture w Gram Stain (surgical/deep wound)     Status: None   Collection Time: 09/28/24  3:24 PM   Specimen: Arm, Left; Abscess  Result Value Ref Range Status   Specimen Description   Final    ABSCESS Performed at [facility], [address] [place], [place] [address]    Special Requests   Final    L BREAST SEROMA Performed at [facility], [address], [place], [place] [address]    Gram Stain   Final    RARE WBC PRESENT, PREDOMINANTLY MONONUCLEAR NO ORGANISMS SEEN    Culture    Final    No growth aerobically or anaerobically. Performed at [facility], [address] [place], [place] [address]    Report Status 10/03/2024 FINAL  Final         Radiology Studies: No results found.         LOS: 10 days   Time spent= 35 mins    Burgess JAYSON Dare, MD [facility]  If 7PM-7AM, please contact night-coverage  10/04/2024, 10:53 AM

## 2024-10-04 NOTE — Plan of Care (Signed)

## 2024-10-05 ENCOUNTER — Ambulatory Visit: Admitting: Radiation Oncology

## 2024-10-05 ENCOUNTER — Ambulatory Visit

## 2024-10-05 NOTE — Progress Notes (Incomplete)
 Radiation Oncology         [phone] ________________________________  Name: Wanda Garcia MRN: [id number]  Date: 10/05/2024  DOB: [birth year]  Follow-Up Visit Note  Outpatient  CC: Center, [facility]  Odean Potts, MD  Diagnosis:      ICD-10-CM   1. Malignant neoplasm of upper-outer quadrant of left breast in female, estrogen receptor positive (HCC)  C50.412    Z17.0          Cancer Staging  Malignant neoplasm of upper-outer quadrant of left breast in female, estrogen receptor positive (HCC) Staging form: Breast, AJCC 8th Edition - Clinical: Stage IIA (cT2, cN1, cM0, G1, ER+, PR+, HER2-) - Signed by Odean Potts, MD on 03/30/2024 - Pathologic stage from 10/05/2024: ypT1c, ypN2a, cM0, G1, ER+, PR+, HER2- - Signed by Izell Domino, MD on 10/05/2024   CHIEF COMPLAINT: Here to discuss management of left breast cancer  Narrative:  The patient returns today for follow-up. She was last seen in office on 03/30/24 at the breast clinic.    Since consultation date, she underwent the following imaging: --CT CAP on 04/05/24 showing left breast mass with smaller satellite lesions consistent with known neoplasm along with left axillary lymphadenopathy.  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   -- A breast MRI performed on 07/21/24 showing positive response to neoadjuvant chemotherapy. Left mass currently measuring 2.1 x 1.1 cm compared to 2.2 x 1.7 cm. Anterior mass on the contrast enhanced mammography measured 9 mm, currently 7 mm. Previous overall span of abnormal enhancement/enhancing masses from the contrast enhanced mammography was 7.5 cm, currently 5.2 cm.  Systemic therapy, involved 8 cycles of paclitaxel  and 4 cycles of dose dense Adriamycin  and Cytoxan  initiated on 04/06/24 under the care of Dr. Odean. 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 She  also stumbles while walking, indicating balance and coordination issues.   Patient underwent a left breast mag seeded lumpectomy with sentinel lymph node biopsy on 08/23/24 under the  care of Dr. Aron. 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  She underwent axillary lymphadenectomy on 09/14/24 with surgical pathology showing metastatic carcinoma to three of seventeen lymph nodes. Largest focus of metastatic carcinoma measures 2.5 mm with no evidence extranodal extension.   Patient was admitted for inpatient care from 11/8 to 11/18 due to being sepsis secondary to left base cellulitis/drain site infection. She underwent a CT chest on 09/24/24 showing postsurgical changes in the left axilla with generalized edema and a surgical drain extending along the left chest wall. Along with a soft tissue density measuring approximately 7.0 x 3.8 cm posterolaterally within the left breast, likely representing a hematoma given recent surgery and separate from the drainage catheter. She was started on linezolid  and underwent a drain placement then discharged after Linezolid  course.   Symptomatically, the patient reports: ***  08-23-24 PATH:  A. BREAST, LEFT, LUMPECTOMY: - Two separate primary tumors:    i) Invasive ductal carcinoma, 1.1 cm, grade 1 (ribbon clip), see comment  ii) Invasive ductal carcinoma, 1.9 cm, grade 1 (coil clip) - Ductal carcinoma in situ, intermediate grade - Resection margins are negative for carcinoma - Negative for lymphovascular or perineural invasion -  Biopsy site changes - See oncology table  B. BREAST, LEFT ADDITIONAL INFERIOR MARGIN, EXCISION: - Small microscopic foci of invasive ductal carcinoma, grade 1 - New inked inferior margin is 0.8 cm from carcinoma  C. BREAST, LEFT ADDITIONAL LATERAL MARGIN, EXCISION: - Benign fibroadipose tissue and breast parenchyma - Negative for in situ or invasive carcinoma  D. LYMPH NODE, LEFT AXILLARY, SENTINEL, EXCISION: - Metastatic carcinoma involving a lymph node (1/1) - Metastatic focus measures 9 mm in greatest dimension and shows focal extranodal extension  E. LYMPH NODE, LEFT AXILLARY #2, SENTINEL, EXCISION: - Metastatic carcinoma involving two lymph nodes (2/2), measuring 2 and 4 mm in greatest dimension yeah - Focally suspicious for extranodal extension  F. LYMPH NODE, LEFT AXILLARY #3, SENTINEL, EXCISION: - Metastatic carcinoma involving a lymph node (1/1) - Metastatic focus measures 5 mm in greatest dimension - focally suspicious for extranodal extension  G. LYMPH NODE, LEFT AXILLARY #4, SENTINEL, EXCISION: - Metastatic carcinoma involving a lymph node (1/1) - Metastatic focus measures 4 mm in greatest dimension and shows focal extranodal extension   COMMENT:  A.  The 2 tumors, though both are low-grade, show a somewhat different histomorphology with more extensive DCIS and a more prominent stromal hyaline fibrosis in the tumor at the coil clip.  Also, sections submitted from in between the 2 lesions are negative for in situ or invasive carcinoma.  Based on this, the 2 tumors very likely represent 2 separate primaries and hence staged accordingly.  Please note that the lateral resection edge near the tumor at the coil clip is positive for carcinoma but the final lateral margin is represented by part C which is negative for carcinoma.  ONCOLOGY TABLE:  Procedure: Lumpectomy Specimen Laterality: Left Histologic Type: Invasive ductal carcinoma, 2 foci Histologic Grade:       Glandular (Acinar)/Tubular Differentiation: 1      Nuclear Pleomorphism: 2      Mitotic Rate: 1      Overall Grade: 1 Tumor Size: 2 tumor foci 1.1 cm (ribbon clip) and 1.9 cm (coil clip) Ductal Carcinoma In Situ: Present, intermediate grade Treatment Effect in the Breast: No known presurgical therapy Margins: All margins negative for invasive carcinoma      Distance from Closest Margin (mm): 5 mm      Specify Closest Margin (required only if <64mm): Posterior margin (tumor at coil clip) DCIS Margins: Uninvolved by DCIS      Distance from Closest Margin (mm): Less than 0.5 mm      Specify Closest Margin (required only if <64mm): Posterior margin (tumor at coil clip) Regional Lymph Nodes:      Number of Lymph Nodes Examined: 5      Number of Sentinel Nodes Examined: 5      Number of Lymph Nodes with Macrometastases (>2 mm): 5      Number of Lymph Nodes with Micrometastases: 0      Number of Lymph Nodes with Isolated Tumor Cells (=0.2 mm or =200 cells): 0      Size of Largest Metastatic Deposit (mm): 9 mm      Extranodal Extension: Present, focal Distant Metastasis:      Distant Site(s) Involved: Not applicable Breast Biomarker Testing Performed on Previous Biopsy:      Testing Performed on Case Number: [id number]  Estrogen Receptor: 95%, positive, strong staining intensity            Progesterone Receptor: 100%, positive, strong staining intensity            HER2: Negative (FISH)            Ki-67: 15% Pathologic Stage Classification (pTNM, AJCC 8th Edition): mpT1c, pN2a Representative Tumor Block: A8    09-14-24 PATH: A. LYMPH NODE, LEFT AXILLARY, DISSECTION:  - Metastatic carcinoma to three of seventeen lymph nodes (3/17)  - Focus of fat necrosis with associated fibrosis and inflammation  - Largest focus of metastatic carcinoma measures 2.5 mm  - No evidence extranodal extension         ALLERGIES:  is allergic to morphine  and porcine (pork)  protein-containing drug products.  Meds: Current Outpatient Medications  Medication Sig Dispense Refill   linezolid (ZYVOX) 600 MG tablet Take 1 tablet (600 mg total) by mouth every 12 (twelve) hours for 24 doses. 24 tablet 0   oxyCODONE  (OXY IR/ROXICODONE ) 5 MG immediate release tablet Take 1-2 tablets (5-10 mg total) by mouth every 6 (six) hours as needed. 30 tablet 0   polyethylene glycol powder (GLYCOLAX/MIRALAX) 17 GM/SCOOP powder Take 17 g by mouth daily as needed for mild constipation or moderate constipation. Dissolve 1 capful (17g) in 4-8 ounces of liquid and take by mouth daily. 238 g 0   No current facility-administered medications for this visit.    Physical Findings:  vitals were not taken for this visit. .     General: Alert and oriented, in no acute distress HEENT: Head is normocephalic. Extraocular movements are intact. Oropharynx is clear. Neck: Neck is supple, no palpable cervical or supraclavicular lymphadenopathy. Heart: Regular in rate and rhythm with no murmurs, rubs, or gallops. Chest: Clear to auscultation bilaterally, with no rhonchi, wheezes, or rales. Abdomen: Soft, nontender, nondistended, with no rigidity or guarding. Extremities: No cyanosis or edema. Lymphatics: see Neck Exam Musculoskeletal: symmetric strength and muscle tone throughout. Neurologic: No obvious focalities. Speech is fluent.  Psychiatric: Judgment and insight are intact. Affect is appropriate. Breast exam reveals ***  Lab Findings: Lab Results  Component Value Date   WBC 6.6 10/04/2024   HGB 12.9 10/04/2024   HCT 40.9 10/04/2024   MCV 79.3 (L) 10/04/2024   PLT 436 (H) 10/04/2024    @LASTCHEMISTRY @  Radiographic Findings: US  AXILLA LEFT Result Date: 09/30/2024 EXAM: US  LEFT UPPER EXTREMITY NONVASCULAR SOFT TISSUE 09/30/2024 07:25:36 PM TECHNIQUE: Real-time ultrasound scan of the left upper extremity with image documentation. COMPARISON: None available. CLINICAL HISTORY: Swelling  in left armpit. FINDINGS: SOFT TISSUES: A complex multiloculated fluid collection is seen within the left axilla measuring 6.2 x 2.8 x 8.0 cm demonstrating multiple internal septa and debris. No significant surrounding hyperemia. The superior most and inferior most extent of the collection is not fully included on this examination, however, the collection appears entirely located within the subcutaneous soft tissues. IMPRESSION: 1. Complex multiloculated fluid collection within the left axilla measuring 6.2 x 2.8 x 8.0 cm, with multiple internal septa and debris, located within the subcutaneous soft tissues. No significant surrounding hyperemia. 2. The superior and inferior extents of the collection are not fully included on this examination. Electronically signed by: Dorethia Molt MD 09/30/2024 09:35 PM EST RP Workstation: [id number]   CT Chest W Contrast Result Date: 09/24/2024 EXAM: CT CHEST WITH CONTRAST 09/24/2024 09:35:42 PM TECHNIQUE: CT of the chest was performed with the administration of 70 mL of iohexol  (OMNIPAQUE ) 300 MG/ML  solution. Multiplanar reformatted images are provided for review. Automated exposure control, iterative reconstruction, and/or weight based adjustment of the mA/kV was utilized to reduce the radiation dose to as low as reasonably achievable. COMPARISON: MRI of the breast from 07/21/2024 CLINICAL HISTORY: Chest wall mass, US maximiano nondiagnostic. FINDINGS: MEDIASTINUM: Thoracic aorta appears within normal limits. No cardiac enlargement is noted. Pulmonary artery appears within normal limits. The esophagus is within normal limits. LYMPH NODES: No mediastinal, hilar or axillary lymphadenopathy. LUNGS AND PLEURA: The lungs are well aerated bilaterally. No focal infiltrate. SOFT TISSUES/BONES: Bony structures are within normal limits. Postsurgical changes are noted in the left axilla consistent with the given clinical history. Generalized edema is noted in the operative site. A surgical  drain is seen extending along the left chest wall. There is a soft tissue density which measures approximately 7.0 x 3.8 cm posterior laterally within the left breast likely representing a hematoma given the recent surgical intervention. This is removed from the drainage catheter. UPPER ABDOMEN: Visualized upper abdomen shows changes of prior cholecystectomy. IMPRESSION: 1. Postsurgical changes in the left axilla with generalized edema and a surgical drain extending along the left chest wall. 2. Soft tissue density measuring approximately 7.0 x 3.8 cm posterolaterally within the left breast, likely representing a hematoma given recent surgery and separate from the drainage catheter. Electronically signed by: Oneil Devonshire MD 09/24/2024 09:52 PM EST RP Workstation: [id number]    Impression/Plan: We discussed adjuvant radiotherapy today.  I recommend *** in order to ***.  I reviewed the logistics, benefits, risks, and potential side effects of this treatment in detail. Risks may include but not necessary be limited to acute and late injury tissue in the radiation fields such as skin irritation (change in color/pigmentation, itching, dryness, pain, peeling). She may experience fatigue. We also discussed possible risk of long term cosmetic changes or scar tissue. There is also a smaller risk for lung toxicity, ***cardiac toxicity, ***brachial plexopathy, ***lymphedema, ***musculoskeletal changes, ***rib fragility or ***induction of a second malignancy, ***late chronic non-healing soft tissue wound.    The patient asked good questions which I answered to her satisfaction. She is enthusiastic about proceeding with treatment. A consent form has been *** signed and placed in her chart.  A total of *** medically necessary complex treatment devices will be fabricated and supervised by me: *** fields with MLCs for custom blocks to protect heart, and lungs;  and, a Vac-lok. MORE COMPLEX DEVICES MAY BE MADE IN DOSIMETRY  FOR FIELD IN FIELD BEAMS FOR DOSE HOMOGENEITY.  I have requested : 3D Simulation which is medically necessary to give adequate dose to at risk tissues while sparing lungs and heart.  I have requested a DVH of the following structures: lungs, heart, *** lumpectomy cavity.    The patient will receive *** Gy in *** fractions to the *** with *** fields.  This will be *** followed by a boost.  On date of service, in total, I spent *** minutes on this encounter. Patient was seen in person.  _____________________________________   Lauraine Golden, MD  This document serves as a record of services personally performed by Lauraine Golden, MD. It was created on her behalf by Reymundo Cartwright, a trained medical scribe. The creation of this record is based on the scribe's personal observations and the provider's statements to them. This document has been checked and approved by the attending provider.

## 2024-10-06 ENCOUNTER — Inpatient Hospital Stay (HOSPITAL_BASED_OUTPATIENT_CLINIC_OR_DEPARTMENT_OTHER): Admitting: Hematology and Oncology

## 2024-10-06 VITALS — BP 117/64 | HR 97 | Temp 98.6°F | Resp 17 | Ht 60.0 in | Wt 171.3 lb

## 2024-10-06 DIAGNOSIS — Z170 Estrogen receptor positive status [ER+]: Secondary | ICD-10-CM | POA: Diagnosis not present

## 2024-10-06 DIAGNOSIS — C773 Secondary and unspecified malignant neoplasm of axilla and upper limb lymph nodes: Secondary | ICD-10-CM | POA: Diagnosis not present

## 2024-10-06 DIAGNOSIS — Z9221 Personal history of antineoplastic chemotherapy: Secondary | ICD-10-CM | POA: Diagnosis not present

## 2024-10-06 DIAGNOSIS — C50412 Malignant neoplasm of upper-outer quadrant of left female breast: Secondary | ICD-10-CM

## 2024-10-06 NOTE — Progress Notes (Signed)
 Patient Care Team: [facility], [facility] as PCP - General Tyree, Nanetta SAILOR, RN as Oncology Nurse Navigator Odean Potts, MD as Consulting Physician (Hematology and Oncology) Aron Shoulders, MD as Consulting Physician (General Surgery) Izell Domino, MD as Attending Physician (Radiation Oncology)  DIAGNOSIS:  Encounter Diagnosis  Name Primary?   Malignant neoplasm of upper-outer quadrant of left breast in female, estrogen receptor positive (HCC) Yes    SUMMARY OF ONCOLOGIC HISTORY: Oncology History  Malignant neoplasm of upper-outer quadrant of left breast in female, estrogen receptor positive (HCC)  03/22/2024 Initial Diagnosis   Screening mammogram detected 2 masses in the left breast UOQ.  Mammogram measured 1.3 cm and 1.1 cm.  Contrast-enhanced mammogram was performed which revealed additional smaller masses (2.4 cm and 1.1 cm) spanning 7 x 3 cm.  Ultrasound measured the mass at 1.6 cm and 0.8 cm along with 2 enlarged axillary lymph nodes 1 of which was biopsy positive.  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   03/30/2024 Cancer Staging   Staging form: Breast, AJCC 8th Edition - Clinical: Stage IIA (cT2, cN1, cM0, G1, ER+, PR+, HER2-) - Signed by Odean Potts, MD on 03/30/2024 Stage prefix: Initial diagnosis Histologic grading system: 3 grade system   04/06/2024 - 07/13/2024 Chemotherapy   Patient is on Treatment Plan : BREAST DOSE DENSE AC q14d / PACLitaxel  q7d     08/23/2024 Surgery   Left lumpectomy: 2 separate primary tumors 1.1 cm grade 1 IDC, 1.9 cm grade 1 IDC, intermediate grade DCIS, margins negative, 5/5 lymph nodes positive with focal extranodal extension ER 95%, PR 100%, HER2 negative, Ki67 15%   09/14/2024 Surgery   Left axillary lymph node dissection: 3/17 lymph nodes positive (total of 8/23 lymph nodes)   10/05/2024 Cancer Staging   Staging form: Breast, AJCC 8th Edition - Pathologic stage from 10/05/2024: ypT1c, ypN2a, cM0, G1, ER+,  PR+, HER2- - Signed by Izell Domino, MD on 10/05/2024 Stage prefix: Post-therapy Histologic grading system: 3 grade system     CHIEF COMPLIANT: Follow-up to discuss the treatment plan after surgery  HISTORY OF PRESENT ILLNESS:  History of Present Illness Wanda Garcia is a 48 year old female who presents for follow-up after surgery for breast cancer.  She was recently hospitalized for sepsis and underwent surgery where two tumors were removed, measuring 1.1 cm and 1.9 cm. A total of 22 lymph nodes were excised, with 8 testing positive for cancer.  Post-surgery, she experiences discomfort in the surgical area. She is aware of the risk of lymphedema due to the extensive lymph node removal.     ALLERGIES:  is allergic to morphine  and porcine (pork) protein-containing drug products.  MEDICATIONS:  Current Outpatient Medications  Medication Sig Dispense Refill   linezolid (ZYVOX) 600 MG tablet Take 1 tablet (600 mg total) by mouth every 12 (twelve) hours for 24 doses. 24 tablet 0   oxyCODONE  (OXY IR/ROXICODONE ) 5 MG immediate release tablet Take 1-2 tablets (5-10 mg total) by mouth every 6 (six) hours as needed. 30 tablet 0   polyethylene glycol powder (GLYCOLAX/MIRALAX) 17 GM/SCOOP powder Take 17 g by mouth daily as needed for mild constipation or moderate constipation. Dissolve 1 capful (17g) in 4-8 ounces of liquid and take by mouth daily. 238 g 0   No current facility-administered medications for this visit.    PHYSICAL EXAMINATION: ECOG PERFORMANCE STATUS: 1 - Symptomatic but completely ambulatory  Vitals:   10/06/24 0848  BP: 117/64  Pulse:  97  Resp: 17  Temp: 98.6 F (37 C)  SpO2: 100%   Filed Weights   10/06/24 0848  Weight: 171 lb 4.8 oz (77.7 kg)    Physical Exam I examined the bandages and there does not appear to be any drainage or discharge around the bandages.   SKIN: Normal, clean, no discharge.  (exam performed in the presence of a  chaperone)  LABORATORY DATA:  I have reviewed the data as listed    Latest Ref Rng & Units 10/04/2024    5:09 AM 10/03/2024    6:00 AM 09/30/2024   11:25 AM  CMP  Glucose 70 - 99 mg/dL 886  889  85   BUN 6 - 20 mg/dL 14  9  9    Creatinine 0.44 - 1.00 mg/dL 9.20  9.36  9.48   Sodium 135 - 145 mmol/L 136  138  139   Potassium 3.5 - 5.1 mmol/L 5.0  4.2  3.8   Chloride 98 - 111 mmol/L 100  105  103   CO2 22 - 32 mmol/L 23  22  24    Calcium 8.9 - 10.3 mg/dL 9.3  8.9  9.3     Lab Results  Component Value Date   WBC 6.6 10/04/2024   HGB 12.9 10/04/2024   HCT 40.9 10/04/2024   MCV 79.3 (L) 10/04/2024   PLT 436 (H) 10/04/2024   NEUTROABS 11.9 (H) 09/25/2024    ASSESSMENT & PLAN:  Malignant neoplasm of upper-outer quadrant of left breast in female, estrogen receptor positive (HCC) 03/22/2024:Screening mammogram detected 2 masses in the left breast UOQ.  Mammogram measured 1.3 cm and 1.1 cm.  Contrast-enhanced mammogram was performed which revealed additional smaller masses (2.4 cm and 1.1 cm) spanning 7 x 3 cm.  Ultrasound measured the mass at 1.6 cm and 0.8 cm along with 2 enlarged axillary lymph nodes 1 of which was biopsy positive.  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    Treatment plan Neoadjuvant chemotherapy with dose dense Adriamycin  and Cytoxan  every 2 weeks x 4 followed by Taxol  weekly x 8 (04/06/2024-07/13/2024) 08/23/2024:Left lumpectomy: 2 separate primary tumors 1.1 cm grade 1 IDC, 1.9 cm grade 1 IDC, intermediate grade DCIS, margins negative, 5/5 lymph nodes positive with focal extranodal extension ER 95%, PR 100%, HER2 negative, Ki67 15%  09/14/2024: ALND: 3/17 nodes positive (total of 8/23 lymph nodes positive) Adjuvant radiation therapy Adjuvant antiestrogen therapy CT CAP 04/06/2024: Left breast mass, left axillary lymph nodes, right hepatic lesion Liver MRI 04/11/2024: Liver hemangioma:  Benign ------------------------------------------------------------------------------------------------------- Pathology counseling: I discussed the final pathology report of the patient provided  a copy of this report. I discussed the margins as well as lymph node surgeries. We also discussed the final staging along with previously performed ER/PR and HER-2/neu testing.  Hospitalization 09/24/2024-10/04/2024: Sepsis/cellulitis at the surgical site Concern for lymphedema: Will refer to physical therapy. Return to clinic after radiation to start antiestrogen therapy  No orders of the defined types were placed in this encounter.  The patient has a good understanding of the overall plan. she agrees with it. she will call with any problems that may develop before the next visit here.  I personally spent a total of 30 minutes in the care of the patient today including preparing to see the patient, getting/reviewing separately obtained history, performing a medically appropriate exam/evaluation, counseling and educating, placing orders, referring and communicating with other health care professionals, documenting clinical information in the  EHR, independently interpreting results, communicating results, and coordinating care.   Viinay K Yeng Perz, MD 10/06/24

## 2024-10-06 NOTE — Assessment & Plan Note (Signed)
 03/22/2024:Screening mammogram detected 2 masses in the left breast UOQ.  Mammogram measured 1.3 cm and 1.1 cm.  Contrast-enhanced mammogram was performed which revealed additional smaller masses (2.4 cm and 1.1 cm) spanning 7 x 3 cm.  Ultrasound measured the mass at 1.6 cm and 0.8 cm along with 2 enlarged axillary lymph nodes 1 of which was biopsy positive.  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    Treatment plan Neoadjuvant chemotherapy with dose dense Adriamycin  and Cytoxan  every 2 weeks x 4 followed by Taxol  weekly x 8 (04/06/2024-07/13/2024) 08/23/2024:Left lumpectomy: 2 separate primary tumors 1.1 cm grade 1 IDC, 1.9 cm grade 1 IDC, intermediate grade DCIS, margins negative, 5/5 lymph nodes positive with focal extranodal extension ER 95%, PR 100%, HER2 negative, Ki67 15%  09/14/2024: ALND: 3/17 nodes positive (total of 8/23 lymph nodes positive) Adjuvant radiation therapy Adjuvant antiestrogen therapy CT CAP 04/06/2024: Left breast mass, left axillary lymph nodes, right hepatic lesion Liver MRI 04/11/2024: Liver hemangioma: Benign ------------------------------------------------------------------------------------------------------- Pathology counseling: I discussed the final pathology report of the patient provided  a copy of this report. I discussed the margins as well as lymph node surgeries. We also discussed the final staging along with previously performed ER/PR and HER-2/neu testing.  Hospitalization 09/24/2024-10/04/2024: Sepsis/cellulitis at the surgical site  Return to clinic after radiation to start antiestrogen therapy

## 2024-10-07 ENCOUNTER — Ambulatory Visit: Admitting: Radiation Oncology

## 2024-10-10 ENCOUNTER — Other Ambulatory Visit

## 2024-10-11 NOTE — Therapy (Signed)
 OUTPATIENT PHYSICAL THERAPY  UPPER EXTREMITY ONCOLOGY POST OP VISIT  Patient Name: Wanda Garcia MRN: [id number] DOB:05/12/1976, 48 y.o., female Today's Date: 10/12/2024  END OF SESSION:  PT End of Session - 10/12/24 0800     Visit Number 2    Number of Visits 14    Date for Recertification  11/23/24    Authorization Type [employer]    PT Start Time 0800    PT Stop Time 0852    PT Time Calculation (min) 52 min    Activity Tolerance Patient tolerated treatment well    Behavior During Therapy [facility] for tasks assessed/performed          Past Medical History:  Diagnosis Date   Arthritis    Breast cancer (HCC) 03/2024   left   Depression    situational   Headache    Neuropathy 07/20/2024   in fingers and toes   Past Surgical History:  Procedure Laterality Date   AXILLARY LYMPH NODE DISSECTION Left 09/14/2024   Procedure: LYMPHADENECTOMY, AXILLARY;  Surgeon: Aron Shoulders, MD;  Location: [facility];  Service: General;  Laterality: Left;  LEFT AXILLARY LYMPH NODE DISSECTION GEN w/PEC BLOCK   AXILLARY SENTINEL NODE BIOPSY Left 08/23/2024   Procedure: LEFT SENTINEL NODE BIOPSY AXILLARY;  Surgeon: Aron Shoulders, MD;  Location: [facility];  Service: General;  Laterality: Left;  LEFT SENTINEL NODE BIOPSY   BREAST BIOPSY Left 03/2024   CESAREAN SECTION N/A 04/06/2017   Procedure: CESAREAN SECTION;  Surgeon: Herchel Gloris LABOR, MD;  [facility]: [facility];  Service: Obstetrics;  Laterality: N/A;   CHOLECYSTECTOMY N/A 04/26/2019   Procedure: LAPAROSCOPIC CHOLECYSTECTOMY WITH POSSIBLE INTRAOPERATIVE CHOLANGIOGRAM;  Surgeon: Vernetta Berg, MD;  Location: [facility];  Service: General;  Laterality: N/A;   CYST EXCISION Right 2024   chest   IRRIGATION AND DEBRIDEMENT HEMATOMA Left 09/28/2024   Procedure: IRRIGATION AND DEBRIDEMENT HEMATOMA;  Surgeon: Aron Shoulders, MD;  Location: [facility];  Service: General;  Laterality: Left;  washout left axilla   PORT-A-CATH REMOVAL N/A 08/23/2024    Procedure: REMOVAL PORT-A-CATH;  Surgeon: Aron Shoulders, MD;  [facility]: [facility];  Service: General;  Laterality: N/A;   PORTACATH PLACEMENT N/A 04/04/2024   Procedure: INSERTION, TUNNELED CENTRAL VENOUS DEVICE, WITH PORT;  Surgeon: Aron Shoulders, MD;  Location: [facility];  Service: General;  Laterality: N/A;   Patient Active Problem List   Diagnosis Date Noted   Sepsis (HCC) 09/24/2024   Port-A-Cath in place 04/06/2024   Malignant neoplasm of upper-outer quadrant of left breast in female, estrogen receptor positive (HCC) 03/28/2024   Neck pain 07/09/2018   Trigger finger of left thumb 07/09/2018   Pain in left wrist 07/09/2018   Pain in right wrist 07/09/2018   S/P cesarean section 04/05/2017   Advanced maternal age, primigravida in first trimester, antepartum     PCP:   REFERRING PROVIDER: Mackey Chad, MD  REFERRING DIAG: Left breast Cancer, at risk for lymphedema  THERAPY DIAG:  Malignant neoplasm of upper-outer quadrant of left breast in female, estrogen receptor positive (HCC)  Abnormal posture  Stiffness of left shoulder, not elsewhere classified  Axillary web syndrome  At risk for lymphedema  ONSET DATE: 08/23/2024  Rationale for Evaluation and Treatment: Rehabilitation  SUBJECTIVE:  SUBJECTIVE STATEMENT:  Having some pain in my left arm pit and left breast. She feels pulling down her arm when she reaches with her left hand and it is very painful. She tries taking pain meds, but they make her sleepy and she wants to get it out of her system. She is still on antibiotics from where she was hospitalized for cellulitis/sepsis11/8/25-11/18/2025. Bedor; interpreter present   PERTINENT HISTORY:  Pt is s/p Neoadjuvant chemotherapy and Left lumpectomy with surgery performed on 08/23/2024 for 2  primary tumors. She had 5+/5 LN's removed at that time. She had an ALND on 09/14/2024 with 3+/17 LN's for a total of 8+/22 LN's. She was hospitalized from 09/24/2024-10/04/2024 for sepsis/cellulitis at the surgical site. She had her drain removed yesterday. 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 PAIN:  Are you having pain? Yes NPRS scale: 5/10 Pain location: Left breast and armpit Pain orientation: Left  PAIN TYPE: tight Pain description: intermittent  Aggravating factors: walking, reaching Relieving factors: resting  PRECAUTIONS: Right UE Lymphedema risk due to 8+/22 LN's removed, neuropathy  RED FLAGS: None   WEIGHT BEARING RESTRICTIONS: No  FALLS:  Has patient fallen in last 6 months? No  LEISURE: cares of son who is autistic  HAND DOMINANCE: right   PRIOR LEVEL OF FUNCTION: Independent  PATIENT GOALS: return to PLOF   OBJECTIVE: Note: Objective measures were completed at Evaluation unless otherwise noted.  COGNITION: Overall cognitive status: Within functional limits for tasks assessed   PALPATION: Very tender/sensitive Left UE    OBSERVATIONS / OTHER ASSESSMENTS: large breast incision healed, axillary incision covered with bandage;just had drain removed yesterday. When bandage removed and reapplied this am very little on bandage. Palpable cording left  axilla into forearm and pt very sensitive to light touch. No signs of UE lymphedema today  SENSATION: Light touch: Deficits     POSTURE: forward head, rounded shoulders   UPPER EXTREMITY STRENGTH:   UPPER EXTREMITY AROM/PROM:   A/PROM RIGHT   eval    Shoulder extension 47  Shoulder flexion 149  Shoulder abduction 154  Shoulder internal rotation 55  Shoulder external rotation 80                          (Blank rows = not tested)   A/PROM LEFT   eval LEFT  Shoulder extension 40 32  Shoulder flexion 147 54, P!  Shoulder abduction 167 40, P!  Shoulder internal rotation 56   Shoulder external rotation 84                            (Blank rows = not tested)   CERVICAL AROM: All within normal limits     UPPER EXTREMITY STRENGTH: WNL   LYMPHEDEMA ASSESSMENTS (in cm):    LANDMARK RIGHT   eval RIGHT 10/12/2024  10 cm proximal to olecranon process 30.2 30.2  Olecranon process 24 24.5  10 cm proximal to ulnar styloid process 21.5 21.1  Just proximal to ulnar styloid process 14.2 14.7  Across hand at thumb web space 17.3 17.7  At base of 2nd digit 5.9 6.2  (Blank rows = not tested)   LANDMARK LEFT   eval LEFT 10/12/2024   10 cm proximal to olecranon process 28.8 29.96  Olecranon process 22.9 22.9  10 cm proximal to ulnar styloid process 20.8 21  Just proximal to ulnar styloid process 14.1 14.5  Across hand at  thumb web space 17.5 17.7  At base of 2nd digit 6 6.2  (Blank rows = not tested)    QUICK DASH SURVEY: 66%                                                                                                                            TREATMENT DATE:  09/22/2024 Pt was instructed in 4 post op exercises; supine flexion and stargazer and sitting or standing scapular retraction, and wall slide for abd. She performed each for 5 reps holding 3-5 seconds ea. Multiple VC's given so pt doesn't push through pain. Advised pt she does not have lymphedema presently. Will need to discss more in next few visits.Discussed POC, LOS, treatment interventions. Pt notes her husband works so it is best for her to have am appts.   PATIENT EDUCATION:  Education details: Pt was instructed in 4 post op exercises; supine flexion and stargazer and sitting or standing scapular retraction, and wall slide for abd. She performed each for 5 reps holding 3-5 seconds ea Person educated: Patient Education method: Explanation, Demonstration, and Handouts Education comprehension: verbalized understanding and returned demonstration  HOME EXERCISE PROGRAM: 4 post op exercises; flexion and stargazer in supine,  scapular retraction sitting or standing, wall slides for abd in standing  ASSESSMENT:  CLINICAL IMPRESSION: Patient is a 48 y.o. female who was seen today for physical therapy evaluation and treatment s/p left lumpectomy with SLNB on 10/7 and  ALND on 09/14/24 with a total of 8+/22 LN's removed. She was also hospitalized for sepsis from 09/24/2024 -10/04/2024 . Her drain was removed 10/10/2024. She presents with significant limitations in AROM with pain, and sensitivity/pain due to axillary cording running from the axilla to the full length of her arm. There are no signs of UE lymphedema  today, but we need to discuss this at length and see if she is interested in getting a compression sleeve as she is at risk due to 22 LN's removed. She is very tense and guarded overall, but did quite well with the exercises when AAflexion and stargazer were done in supine. Pt will benefit from skilled therapy to address deficits noted above and to return pt to her PLOF   OBJECTIVE IMPAIRMENTS: decreased activity tolerance, decreased knowledge of condition, decreased ROM, decreased strength, impaired sensation, impaired UE functional use, postural dysfunction, and pain.   ACTIVITY LIMITATIONS: carrying, lifting, sleeping, bed mobility, dressing, reach over head, hygiene/grooming, and caring for others  PARTICIPATION LIMITATIONS: cleaning, laundry, community activity, and caring for her autistic child  PERSONAL FACTORS: 3+ comorbidities: left breast cancer s/p neoadjuvant chemo therapy, several surgeries, sepsis, 22 LN's removed, pending radiation are also affecting patient's functional outcome.   REHAB POTENTIAL: Good  CLINICAL DECISION MAKING: Evolving/moderate complexity  EVALUATION COMPLEXITY: Moderate  GOALS: Goals reviewed with patient? Yes    LONG TERM GOALS: (STG=LTG)      Name Target Date Goal status  1 Pt will be able to verbalize  understanding of pertinent lymphedema risk reduction practices  relevant to her dx specifically related to skin care.  Baseline:  No knowledge 03/30/2024 Achieved at eval  2 Pt will be able to return demo and/or verbalize understanding of the post op HEP related to regaining shoulder ROM. Baseline:  No knowledge 03/30/2024 Achieved at eval  3 Pt will be able to verbalize understanding of the importance of viewing the post op After Breast CA Class video for further lymphedema risk reduction education and therapeutic exercise.  Baseline:  No knowledge 03/30/2024 Achieved at eval  4 Pt will demo she has regained full shoulder ROM and function post operatively compared to baselines.  Baseline: See objective measurements taken today. 11/24/2023 RE-EVAL    5   Pt will have decreased pain by 60% or greater for improved daily activities 11/24/2023 RE-EVAL    6    Pt will be educated in precautions/risk factors for lymphedema and importance of SOZO screens, compression sleeve as preventative 11/24/2023 RE-EVAL          PLAN:  PT FREQUENCY: 2x/week  PT DURATION: 6 weeks  PLANNED INTERVENTIONS: 97164- PT Re-evaluation, 97750- Physical Performance Testing, 97110-Therapeutic exercises, 97530- Therapeutic activity, 97112- Neuromuscular re-education, 97535- Self Care, 02859- Manual therapy, [id number]- Gait training, [phone]- Orthotic Initial, [phone]- Orthotic/Prosthetic subsequent, and Patient/Family education  PLAN FOR NEXT SESSION: set up SOZO screen around Jan 7, Discuss compression sleeve and order if desired, (may not be covered as preventative), Review HEP, manual techniques for cording, PROM, STM prn . Progress as tolerated. See hx  Grayce JINNY Sheldon, PT 10/12/2024, 9:36 AM

## 2024-10-12 ENCOUNTER — Encounter: Payer: Self-pay | Admitting: *Deleted

## 2024-10-12 ENCOUNTER — Ambulatory Visit: Attending: Hematology and Oncology

## 2024-10-12 DIAGNOSIS — C50412 Malignant neoplasm of upper-outer quadrant of left female breast: Secondary | ICD-10-CM | POA: Insufficient documentation

## 2024-10-12 DIAGNOSIS — L905 Scar conditions and fibrosis of skin: Secondary | ICD-10-CM | POA: Insufficient documentation

## 2024-10-12 DIAGNOSIS — M25612 Stiffness of left shoulder, not elsewhere classified: Secondary | ICD-10-CM | POA: Insufficient documentation

## 2024-10-12 DIAGNOSIS — Z9189 Other specified personal risk factors, not elsewhere classified: Secondary | ICD-10-CM | POA: Diagnosis present

## 2024-10-12 DIAGNOSIS — Z170 Estrogen receptor positive status [ER+]: Secondary | ICD-10-CM | POA: Insufficient documentation

## 2024-10-12 DIAGNOSIS — L7682 Other postprocedural complications of skin and subcutaneous tissue: Secondary | ICD-10-CM | POA: Diagnosis present

## 2024-10-12 DIAGNOSIS — R293 Abnormal posture: Secondary | ICD-10-CM | POA: Insufficient documentation

## 2024-10-17 ENCOUNTER — Ambulatory Visit

## 2024-10-17 ENCOUNTER — Ambulatory Visit: Admitting: Radiation Oncology

## 2024-10-17 ENCOUNTER — Other Ambulatory Visit: Payer: Self-pay | Admitting: General Surgery

## 2024-10-18 ENCOUNTER — Ambulatory Visit: Admitting: Physical Therapy

## 2024-10-18 ENCOUNTER — Ambulatory Visit: Admitting: Radiation Oncology

## 2024-10-18 ENCOUNTER — Encounter: Payer: Self-pay | Admitting: Physical Therapy

## 2024-10-18 DIAGNOSIS — L905 Scar conditions and fibrosis of skin: Secondary | ICD-10-CM | POA: Insufficient documentation

## 2024-10-18 DIAGNOSIS — Z9189 Other specified personal risk factors, not elsewhere classified: Secondary | ICD-10-CM | POA: Diagnosis present

## 2024-10-18 DIAGNOSIS — M25612 Stiffness of left shoulder, not elsewhere classified: Secondary | ICD-10-CM | POA: Insufficient documentation

## 2024-10-18 DIAGNOSIS — Z170 Estrogen receptor positive status [ER+]: Secondary | ICD-10-CM | POA: Diagnosis present

## 2024-10-18 DIAGNOSIS — R293 Abnormal posture: Secondary | ICD-10-CM | POA: Insufficient documentation

## 2024-10-18 DIAGNOSIS — C50412 Malignant neoplasm of upper-outer quadrant of left female breast: Secondary | ICD-10-CM | POA: Diagnosis present

## 2024-10-18 DIAGNOSIS — L7682 Other postprocedural complications of skin and subcutaneous tissue: Secondary | ICD-10-CM | POA: Insufficient documentation

## 2024-10-18 NOTE — Therapy (Signed)
 OUTPATIENT PHYSICAL THERAPY  UPPER EXTREMITY ONCOLOGY POST OP VISIT  Patient Name: Wanda Garcia MRN: [id number] DOB:[birth year], 48 y.o., female Today's Date: 10/18/2024  END OF SESSION:  PT End of Session - 10/18/24 0806     Visit Number 3    Number of Visits 14    Date for Recertification  11/23/24    PT Start Time 0804    PT Stop Time 0853    PT Time Calculation (min) 49 min    Activity Tolerance Patient tolerated treatment well    Behavior During Therapy [facility] for tasks assessed/performed          Past Medical History:  Diagnosis Date   Arthritis    Breast cancer (HCC) 03/2024   left   Depression    situational   Headache    Neuropathy 07/20/2024   in fingers and toes   Past Surgical History:  Procedure Laterality Date   AXILLARY LYMPH NODE DISSECTION Left 09/14/2024   Procedure: LYMPHADENECTOMY, AXILLARY;  Surgeon: Aron Shoulders, MD;  [facility]: [facility];  Service: General;  Laterality: Left;  LEFT AXILLARY LYMPH NODE DISSECTION GEN w/PEC BLOCK   AXILLARY SENTINEL NODE BIOPSY Left 08/23/2024   Procedure: LEFT SENTINEL NODE BIOPSY AXILLARY;  Surgeon: Aron Shoulders, MD;  [facility]: [facility];  Service: General;  Laterality: Left;  LEFT SENTINEL NODE BIOPSY   BREAST BIOPSY Left 03/2024   CESAREAN SECTION N/A 04/06/2017   Procedure: CESAREAN SECTION;  Surgeon: Herchel Gloris LABOR, MD;  [facility]: [facility];  Service: Obstetrics;  Laterality: N/A;   CHOLECYSTECTOMY N/A 04/26/2019   Procedure: LAPAROSCOPIC CHOLECYSTECTOMY WITH POSSIBLE INTRAOPERATIVE CHOLANGIOGRAM;  Surgeon: Vernetta Berg, MD;  [facility]: [facility];  Service: General;  Laterality: N/A;   CYST EXCISION Right 2024   chest   IRRIGATION AND DEBRIDEMENT HEMATOMA Left 09/28/2024   Procedure: IRRIGATION AND DEBRIDEMENT HEMATOMA;  Surgeon: Aron Shoulders, MD;  Location: [facility];  Service: General;  Laterality: Left;  washout left axilla   PORT-A-CATH REMOVAL N/A 08/23/2024   Procedure: REMOVAL PORT-A-CATH;   Surgeon: Aron Shoulders, MD;  [facility];  Service: General;  Laterality: N/A;   PORTACATH PLACEMENT N/A 04/04/2024   Procedure: INSERTION, TUNNELED CENTRAL VENOUS DEVICE, WITH PORT;  Surgeon: Aron Shoulders, MD;  Location: [facility];  Service: General;  Laterality: N/A;   Patient Active Problem List   Diagnosis Date Noted   Sepsis (HCC) 09/24/2024   Port-A-Cath in place 04/06/2024   Malignant neoplasm of upper-outer quadrant of left breast in female, estrogen receptor positive (HCC) 03/28/2024   Neck pain 07/09/2018   Trigger finger of left thumb 07/09/2018   Pain in left wrist 07/09/2018   Pain in right wrist 07/09/2018   S/P cesarean section 04/05/2017   Advanced maternal age, primigravida in first trimester, antepartum     PCP:   REFERRING PROVIDER: Mackey Chad, MD  REFERRING DIAG: Left breast Cancer, at risk for lymphedema  THERAPY DIAG:  Stiffness of left shoulder, not elsewhere classified  Axillary web syndrome  At risk for lymphedema  Abnormal posture  Malignant neoplasm of upper-outer quadrant of left breast in female, estrogen receptor positive (HCC)  ONSET DATE: 08/23/2024  Rationale for Evaluation and Treatment: Rehabilitation  SUBJECTIVE:  SUBJECTIVE STATEMENT:  The pain is now in my forearm. It was not here before. It is like pressure and burning. I also have pain in my hand and in to my thumb. It started a week ago approximately.    PERTINENT HISTORY:  Pt is s/p Neoadjuvant chemotherapy and Left lumpectomy with surgery performed on 08/23/2024 for 2 primary tumors. She had 5+/5 LN's removed at that time. She had an ALND on 09/14/2024 with 3+/17 LN's for a total of 8+/22 LN's. She was hospitalized from 09/24/2024-10/04/2024 for sepsis/cellulitis at the surgical site. She had her  drain removed yesterday. 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 PAIN:  Are you having pain? Yes NPRS scale: 3/10 Pain location: L chest Pain orientation: Left  PAIN TYPE: tight Pain description: intermittent  Aggravating factors: walking, reaching Relieving factors: resting  PRECAUTIONS: Right UE Lymphedema risk due to 8+/22 LN's removed, neuropathy  RED FLAGS: None   WEIGHT BEARING RESTRICTIONS: No  FALLS:  Has patient fallen in last 6 months? No  LEISURE: cares of son who is autistic  HAND DOMINANCE: right   PRIOR LEVEL OF FUNCTION: Independent  PATIENT GOALS: return to PLOF   OBJECTIVE: Note: Objective measures were completed at Evaluation unless otherwise noted.  COGNITION: Overall cognitive status: Within functional limits for tasks assessed   PALPATION: Very tender/sensitive Left UE    OBSERVATIONS / OTHER ASSESSMENTS: large breast incision healed, axillary incision covered with bandage;just had drain removed yesterday. When bandage removed and reapplied this am very little on bandage. Palpable cording left  axilla into forearm and pt very sensitive to light touch. No signs of UE lymphedema today  SENSATION: Light touch: Deficits     POSTURE: forward head, rounded shoulders   UPPER EXTREMITY STRENGTH:   UPPER EXTREMITY AROM/PROM:   A/PROM RIGHT   eval    Shoulder extension 47  Shoulder flexion 149  Shoulder abduction 154  Shoulder internal rotation 55  Shoulder external rotation 80                          (Blank rows = not tested)   A/PROM LEFT   eval LEFT LEFT  Shoulder extension 40 32   Shoulder flexion 147 54, P! 130 P!  Shoulder abduction 167 40, P! 103 P!  Shoulder internal rotation 56    Shoulder external rotation 84                            (Blank rows = not tested)   CERVICAL AROM: All within normal limits     UPPER EXTREMITY STRENGTH: WNL   LYMPHEDEMA ASSESSMENTS (in cm):    LANDMARK RIGHT   eval RIGHT 10/12/2024  10 cm  proximal to olecranon process 30.2 30.2  Olecranon process 24 24.5  10 cm proximal to ulnar styloid process 21.5 21.1  Just proximal to ulnar styloid process 14.2 14.7  Across hand at thumb web space 17.3 17.7  At base of 2nd digit 5.9 6.2  (Blank rows = not tested)   LANDMARK LEFT   eval LEFT 10/12/2024   10 cm proximal to olecranon process 28.8 29.96  Olecranon process 22.9 22.9  10 cm proximal to ulnar styloid process 20.8 21  Just proximal to ulnar styloid process 14.1 14.5  Across hand at thumb web space 17.5 17.7  At base of 2nd digit 6 6.2  (Blank rows = not tested)  QUICK DASH SURVEY: 66%                                                                                                                            TREATMENT DATE:  10/18/24: Remeasured shoulder ROM Therapeutic Exercise: Pulleys x 2 min in direction of flexion and 2 min in direction of abduction with pt returning therapist demo with v/c for proper form and to keep from hiking L shoulder Ball up wall x 10 reps in to flexion and 5 reps in to L shoulder abduction (stopped at 5 reps due to pain) with pt returning therapist demo Manual Therapy: In supine: PROM to L shoulder in to flexion, abduction, IR and ER with performing gentle MFR to cording from axilla to forearm while educating pt about cording and importance of constant stretching In R side lying: STM to L upper traps and rhomboids using cocoa butter with numerous areas of muscle tightness palpable   09/22/2024 Pt was instructed in 4 post op exercises; supine flexion and stargazer and sitting or standing scapular retraction, and wall slide for abd. She performed each for 5 reps holding 3-5 seconds ea. Multiple VC's given so pt doesn't push through pain. Advised pt she does not have lymphedema presently. Will need to discss more in next few visits.Discussed POC, LOS, treatment interventions. Pt notes her husband works so it is best for her to have am  appts.   PATIENT EDUCATION:  Education details: Pt was instructed in 4 post op exercises; supine flexion and stargazer and sitting or standing scapular retraction, and wall slide for abd. She performed each for 5 reps holding 3-5 seconds ea Person educated: Patient Education method: Explanation, Demonstration, and Handouts Education comprehension: verbalized understanding and returned demonstration  HOME EXERCISE PROGRAM: 4 post op exercises; flexion and stargazer in supine, scapular retraction sitting or standing, wall slides for abd in standing  ASSESSMENT:  CLINICAL IMPRESSION: Pt reports the pain now extends to her forearm. Therapist could palpate cording from axilla to forearm. Her L shoulder ROM has improved from last session and pt reports she has been trying to stretch at home. Encouraged pt to stretch consistently to help reduce tightness. She requires frequent cues to keep shoulder relaxed as she tends to guard. Began gentle MFR to cording throughout  LUE and began STM to L upper traps and rhomboids with increased tightness noted. Pt reports she felt better by end of session.    OBJECTIVE IMPAIRMENTS: decreased activity tolerance, decreased knowledge of condition, decreased ROM, decreased strength, impaired sensation, impaired UE functional use, postural dysfunction, and pain.   ACTIVITY LIMITATIONS: carrying, lifting, sleeping, bed mobility, dressing, reach over head, hygiene/grooming, and caring for others  PARTICIPATION LIMITATIONS: cleaning, laundry, community activity, and caring for her autistic child  PERSONAL FACTORS: 3+ comorbidities: left breast cancer s/p neoadjuvant chemo therapy, several surgeries, sepsis, 22 LN's removed, pending radiation are also affecting patient's functional outcome.   REHAB POTENTIAL: Good  CLINICAL DECISION  MAKING: Evolving/moderate complexity  EVALUATION COMPLEXITY: Moderate  GOALS: Goals reviewed with patient? Yes    LONG TERM  GOALS: (STG=LTG)      Name Target Date Goal status  1 Pt will be able to verbalize understanding of pertinent lymphedema risk reduction practices relevant to her dx specifically related to skin care.  Baseline:  No knowledge 03/30/2024 Achieved at eval  2 Pt will be able to return demo and/or verbalize understanding of the post op HEP related to regaining shoulder ROM. Baseline:  No knowledge 03/30/2024 Achieved at eval  3 Pt will be able to verbalize understanding of the importance of viewing the post op After Breast CA Class video for further lymphedema risk reduction education and therapeutic exercise.  Baseline:  No knowledge 03/30/2024 Achieved at eval  4 Pt will demo she has regained full shoulder ROM and function post operatively compared to baselines.  Baseline: See objective measurements taken today. 11/24/2023 RE-EVAL    5   Pt will have decreased pain by 60% or greater for improved daily activities 11/24/2023 RE-EVAL    6    Pt will be educated in precautions/risk factors for lymphedema and importance of SOZO screens, compression sleeve as preventative 11/24/2023 RE-EVAL          PLAN:  PT FREQUENCY: 2x/week  PT DURATION: 6 weeks  PLANNED INTERVENTIONS: 97164- PT Re-evaluation, 97750- Physical Performance Testing, 97110-Therapeutic exercises, 97530- Therapeutic activity, 97112- Neuromuscular re-education, 97535- Self Care, 02859- Manual therapy, [id number]- Gait training, [phone]- Orthotic Initial, [phone]- Orthotic/Prosthetic subsequent, and Patient/Family education  PLAN FOR NEXT SESSION: set up SOZO screen around Jan 7, Discuss compression sleeve and order if desired, (may not be covered as preventative), Review HEP, manual techniques for cording, PROM, STM prn . Progress as tolerated. See hx  Florina Lanis Carbon, PT 10/18/2024, 9:03 AM

## 2024-10-19 NOTE — Progress Notes (Signed)
 Location of Breast Cancer: Malignant Neoplasm of Upper-Outer Quadrant of Left Breast, Estrogen Receptor Positive  Histology per Pathology Report:    Receptor Status: ER(Strongly Positive), PR (Strongly Positive), Her2-neu (Negative), Ki-67(15%)  Did patient present with symptoms (if so, please note symptoms) or was this found on screening mammography?:  Mammogram    Past/Anticipated interventions by surgeon, if any:  09/12/2024 Aron, MD   Past/Anticipated interventions by medical oncology, if any: Chemotherapy ***  Lymphedema issues, if any:  {:18581} {t:21944}   Pain issues, if any:  {:18581} {PAIN DESCRIPTION:[id number]}  SAFETY ISSUES: Prior radiation? {:18581} Pacemaker/ICD? {:18581} Possible current pregnancy?{:18581} Is the patient on methotrexate? {:18581}  Current Complaints / other details:  ***

## 2024-10-20 ENCOUNTER — Ambulatory Visit

## 2024-10-20 DIAGNOSIS — Z9189 Other specified personal risk factors, not elsewhere classified: Secondary | ICD-10-CM

## 2024-10-20 DIAGNOSIS — L905 Scar conditions and fibrosis of skin: Secondary | ICD-10-CM

## 2024-10-20 DIAGNOSIS — M25612 Stiffness of left shoulder, not elsewhere classified: Secondary | ICD-10-CM

## 2024-10-20 DIAGNOSIS — R293 Abnormal posture: Secondary | ICD-10-CM

## 2024-10-20 DIAGNOSIS — Z170 Estrogen receptor positive status [ER+]: Secondary | ICD-10-CM

## 2024-10-20 NOTE — Therapy (Signed)
 OUTPATIENT PHYSICAL THERAPY  UPPER EXTREMITY ONCOLOGY POST OP VISIT  Patient Name: Wanda Garcia MRN: [id number] DOB:May 13, 1976, 48 y.o., female Today's Date: 10/20/2024  END OF SESSION:  PT End of Session - 10/20/24 0800     Visit Number 4    Number of Visits 14    Date for Recertification  11/23/24    Authorization Type [employer]    Authorization Time Period 11/26-2/24/2025    Authorization - Visit Number 3    Authorization - Number of Visits 15    PT Start Time 0803    PT Stop Time 0857    PT Time Calculation (min) 54 min    Activity Tolerance Patient tolerated treatment well    Behavior During Therapy [facility] for tasks assessed/performed          Past Medical History:  Diagnosis Date   Arthritis    Breast cancer (HCC) 03/2024   left   Depression    situational   Headache    Neuropathy 07/20/2024   in fingers and toes   Past Surgical History:  Procedure Laterality Date   AXILLARY LYMPH NODE DISSECTION Left 09/14/2024   Procedure: LYMPHADENECTOMY, AXILLARY;  Surgeon: Aron Shoulders, MD;  Location: [facility];  Service: General;  Laterality: Left;  LEFT AXILLARY LYMPH NODE DISSECTION GEN w/PEC BLOCK   AXILLARY SENTINEL NODE BIOPSY Left 08/23/2024   Procedure: LEFT SENTINEL NODE BIOPSY AXILLARY;  Surgeon: Aron Shoulders, MD;  Location: [facility];  Service: General;  Laterality: Left;  LEFT SENTINEL NODE BIOPSY   BREAST BIOPSY Left 03/2024   CESAREAN SECTION N/A 04/06/2017   Procedure: CESAREAN SECTION;  Surgeon: Herchel Gloris LABOR, MD;  Location: [facility];  Service: Obstetrics;  Laterality: N/A;   CHOLECYSTECTOMY N/A 04/26/2019   Procedure: LAPAROSCOPIC CHOLECYSTECTOMY WITH POSSIBLE INTRAOPERATIVE CHOLANGIOGRAM;  Surgeon: Vernetta Berg, MD;  Location: [facility];  Service: General;  Laterality: N/A;   CYST EXCISION Right 2024   chest   IRRIGATION AND DEBRIDEMENT HEMATOMA Left 09/28/2024   Procedure: IRRIGATION AND DEBRIDEMENT HEMATOMA;  Surgeon: Aron Shoulders, MD;   Location: [facility];  Service: General;  Laterality: Left;  washout left axilla   PORT-A-CATH REMOVAL N/A 08/23/2024   Procedure: REMOVAL PORT-A-CATH;  Surgeon: Aron Shoulders, MD;  Location: [facility];  Service: General;  Laterality: N/A;   PORTACATH PLACEMENT N/A 04/04/2024   Procedure: INSERTION, TUNNELED CENTRAL VENOUS DEVICE, WITH PORT;  Surgeon: Aron Shoulders, MD;  Location: [facility];  Service: General;  Laterality: N/A;   Patient Active Problem List   Diagnosis Date Noted   Sepsis (HCC) 09/24/2024   Port-A-Cath in place 04/06/2024   Malignant neoplasm of upper-outer quadrant of left breast in female, estrogen receptor positive (HCC) 03/28/2024   Neck pain 07/09/2018   Trigger finger of left thumb 07/09/2018   Pain in left wrist 07/09/2018   Pain in right wrist 07/09/2018   S/P cesarean section 04/05/2017   Advanced maternal age, primigravida in first trimester, antepartum     PCP:   REFERRING PROVIDER: Mackey Chad, MD  REFERRING DIAG: Left breast Cancer, at risk for lymphedema  THERAPY DIAG:  Stiffness of left shoulder, not elsewhere classified  Axillary web syndrome  At risk for lymphedema  Abnormal posture  Malignant neoplasm of upper-outer quadrant of left breast in female, estrogen receptor positive (HCC)  ONSET DATE: 08/23/2024  Rationale for Evaluation and Treatment: Rehabilitation  SUBJECTIVE:  SUBJECTIVE STATEMENT:   Doing better overall. ROM is better but abduction is still hard. She did a lot of housework yesterday and arm is a little sore  PERTINENT HISTORY:  Pt is s/p Neoadjuvant chemotherapy and Left lumpectomy with surgery performed on 08/23/2024 for 2 primary tumors. She had 5+/5 LN's removed at that time. She had an ALND on 09/14/2024 with 3+/17 LN's for a total of 8+/22 LN's.  She was hospitalized from 09/24/2024-10/04/2024 for sepsis/cellulitis at the surgical site. She had her drain removed yesterday. 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 PAIN:  Are you having pain? Yes NPRS scale: 3/10- 4/10 Pain location: L chest Pain orientation: Left  PAIN TYPE: tight Pain description: intermittent  Aggravating factors: walking, reaching Relieving factors: resting  PRECAUTIONS: Right UE Lymphedema risk due to 8+/22 LN's removed, neuropathy  RED FLAGS: None   WEIGHT BEARING RESTRICTIONS: No  FALLS:  Has patient fallen in last 6 months? No  LEISURE: cares of son who is autistic  HAND DOMINANCE: right   PRIOR LEVEL OF FUNCTION: Independent  PATIENT GOALS: return to PLOF   OBJECTIVE: Note: Objective measures were completed at Evaluation unless otherwise noted.  COGNITION: Overall cognitive status: Within functional limits for tasks assessed   PALPATION: Very tender/sensitive Left UE    OBSERVATIONS / OTHER ASSESSMENTS: large breast incision healed, axillary incision covered with bandage;just had drain removed yesterday. When bandage removed and reapplied this am very little on bandage. Palpable cording left  axilla into forearm and pt very sensitive to light touch. No signs of UE lymphedema today  SENSATION: Light touch: Deficits     POSTURE: forward head, rounded shoulders   UPPER EXTREMITY STRENGTH:   UPPER EXTREMITY AROM/PROM:   A/PROM RIGHT   eval    Shoulder extension 47  Shoulder flexion 149  Shoulder abduction 154  Shoulder internal rotation 55  Shoulder external rotation 80                          (Blank rows = not tested)   A/PROM LEFT   eval LEFT LEFT  Shoulder extension 40 32   Shoulder flexion 147 54, P! 130 P!  Shoulder abduction 167 40, P! 103 P!  Shoulder internal rotation 56    Shoulder external rotation 84                            (Blank rows = not tested)   CERVICAL AROM: All within normal limits     UPPER  EXTREMITY STRENGTH: WNL   LYMPHEDEMA ASSESSMENTS (in cm):    LANDMARK RIGHT   eval RIGHT 10/12/2024  10 cm proximal to olecranon process 30.2 30.2  Olecranon process 24 24.5  10 cm proximal to ulnar styloid process 21.5 21.1  Just proximal to ulnar styloid process 14.2 14.7  Across hand at thumb web space 17.3 17.7  At base of 2nd digit 5.9 6.2  (Blank rows = not tested)   LANDMARK LEFT   eval LEFT 10/12/2024   10 cm proximal to olecranon process 28.8 29.96  Olecranon process 22.9 22.9  10 cm proximal to ulnar styloid process 20.8 21  Just proximal to ulnar styloid process 14.1 14.5  Across hand at thumb web space 17.5 17.7  At base of 2nd digit 6 6.2  (Blank rows = not tested)    QUICK DASH SURVEY: 66%  TREATMENT DATE:  10/20/2024 Discussed compression garment with pt. And she would like to have one for lymphedema prevention. Discussed purchasing on compression [employer]uru, vs [employer] [facility]. Discussed wearing for repetitive activities and flying Pulleys x 2 min flexion and scaption with VC not to force Gentle MFR to cording in left UE. Supine  STM to left UT, pectorals and lateral trunk with cocoa butter PROM left shoulder flex, scaption, abd, ER 3 D AROM bilaterally flexion, scaption, horizontal abd x 5 Showed pt how to stretch cords at elbow and with wrist. Gave pt info to order on compression guru [employer]o dynamic Size 1 Max sleeve with silicone border, and size 1 gauntlet or may go to [facility] to purchase. She will speak with her husband 10/18/24: Remeasured shoulder ROM Therapeutic Exercise: Pulleys x 2 min in direction of flexion and 2 min in direction of abduction with pt returning therapist demo with v/c for proper form and to keep from hiking L shoulder Ball up wall x 10 reps in to flexion and 5 reps in to L shoulder abduction (stopped at 5 reps  due to pain) with pt returning therapist demo Manual Therapy: In supine: PROM to L shoulder in to flexion, abduction, IR and ER with performing gentle MFR to cording from axilla to forearm while educating pt about cording and importance of constant stretching In R side lying: STM to L upper traps and rhomboids using cocoa butter with numerous areas of muscle tightness palpable   09/22/2024 Pt was instructed in 4 post op exercises; supine flexion and stargazer and sitting or standing scapular retraction, and wall slide for abd. She performed each for 5 reps holding 3-5 seconds ea. Multiple VC's given so pt doesn't push through pain. Advised pt she does not have lymphedema presently. Will need to discss more in next few visits.Discussed POC, LOS, treatment interventions. Pt notes her husband works so it is best for her to have am appts.   PATIENT EDUCATION:  Education details: Pt was instructed in 4 post op exercises; supine flexion and stargazer and sitting or standing scapular retraction, and wall slide for abd. She performed each for 5 reps holding 3-5 seconds ea Person educated: Patient Education method: Explanation, Demonstration, and Handouts Education comprehension: verbalized understanding and returned demonstration  HOME EXERCISE PROGRAM: 4 post op exercises; flexion and stargazer in supine, scapular retraction sitting or standing, wall slides for abd in standing  ASSESSMENT:  CLINICAL IMPRESSION: Pt did very well with supine AROM today. Cording is still very tight and she tolerates very gentle MFR techniques with shoulder below 90 degrees of abduction. Showed her how to stretch elbow cords in standing and with wrist ext. Gave info to order prophylactic sleeve, or she may opt to go to [facility].    OBJECTIVE IMPAIRMENTS: decreased activity tolerance, decreased knowledge of condition, decreased ROM, decreased strength, impaired sensation, impaired UE functional use, postural  dysfunction, and pain.   ACTIVITY LIMITATIONS: carrying, lifting, sleeping, bed mobility, dressing, reach over head, hygiene/grooming, and caring for others  PARTICIPATION LIMITATIONS: cleaning, laundry, community activity, and caring for her autistic child  PERSONAL FACTORS: 3+ comorbidities: left breast cancer s/p neoadjuvant chemo therapy, several surgeries, sepsis, 22 LN's removed, pending radiation are also affecting patient's functional outcome.   REHAB POTENTIAL: Good  CLINICAL DECISION MAKING: Evolving/moderate complexity  EVALUATION COMPLEXITY: Moderate  GOALS: Goals reviewed with patient? Yes    LONG TERM GOALS: (STG=LTG)      Name Target Date Goal status  1 Pt will be able to verbalize understanding of pertinent lymphedema risk reduction practices relevant to her dx specifically related to skin care.  Baseline:  No knowledge 03/30/2024 Achieved at eval  2 Pt will be able to return demo and/or verbalize understanding of the post op HEP related to regaining shoulder ROM. Baseline:  No knowledge 03/30/2024 Achieved at eval  3 Pt will be able to verbalize understanding of the importance of viewing the post op After Breast CA Class video for further lymphedema risk reduction education and therapeutic exercise.  Baseline:  No knowledge 03/30/2024 Achieved at eval  4 Pt will demo she has regained full shoulder ROM and function post operatively compared to baselines.  Baseline: See objective measurements taken today. 11/24/2023 RE-EVAL    5   Pt will have decreased pain by 60% or greater for improved daily activities 11/24/2023 RE-EVAL    6    Pt will be educated in precautions/risk factors for lymphedema and importance of SOZO screens, compression sleeve as preventative 11/24/2023 RE-EVAL          PLAN:  PT FREQUENCY: 2x/week  PT DURATION: 6 weeks  PLANNED INTERVENTIONS: 97164- PT Re-evaluation, 97750- Physical Performance Testing, 97110-Therapeutic exercises, 97530-  Therapeutic activity, 97112- Neuromuscular re-education, 97535- Self Care, [id number]- Manual therapy, [id number]- Gait training, [phone]- Orthotic Initial, [phone]- Orthotic/Prosthetic subsequent, and Patient/Family education  PLAN FOR NEXT SESSION: set up SOZO screen around Jan 7, Did she order sleeve?, (may not be covered as preventative), Review HEP, manual techniques for cording, PROM, STM prn . Progress as tolerated. See hx  Grayce JINNY Sheldon, PT 10/20/2024, 11:20 AM

## 2024-10-20 NOTE — Progress Notes (Signed)
 Radiation Oncology         [phone] ________________________________  Name: Wanda Garcia MRN: [id number]  Date: 10/21/2024  DOB: 03-21-1976  Follow-Up Visit Note  Outpatient  CC: Center, [facility]  Odean Potts, MD  Diagnosis:      ICD-10-CM   1. 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 and extensive left axillary lymph node involvement, ER+ / PR+ / Her2-, Grade 1: s/p neoadjuvant chemotherapy followed by a left breast lumpectomy with left axillary SLN excisions, w/ metastatic carcinoma in 5/5 SLNs, followed by left axillary lymphadenectomy w/ 3 LN's positive for carcinoma    Cancer Staging  Malignant neoplasm of upper-outer quadrant of left breast in female, estrogen receptor positive (HCC) Staging form: Breast, AJCC 8th Edition - Clinical: Stage IIA (cT2, cN1, cM0, G1, ER+, PR+, HER2-) - Signed by Odean Potts, MD on 03/30/2024 - Pathologic stage from 10/05/2024: ypT1c, ypN2a, cM0, G1, ER+, PR+, HER2- - Signed by Izell Domino, MD on 10/05/2024  CHIEF COMPLAINT: Here to discuss management of left breast cancer  Narrative:  The patient returns today for follow-up.     Since her breast clinic consultation date on 03/30/24, she underwent the following imaging (dates and results as follows): -- CT CAP with and w/out contrast on 04/05/24 demonstrated the known left breast mass with smaller satellite lesions (consistent with the known site of malignancy), evidence of left axillary lymphadenopathy, and an incompletely characterized sub-centimeter right hepatic lobe lesion. No other sites of metastatic disease were demonstrated in the chest, abdomen, or pelvis.  -- Whole body bone scan on 04/07/24 showed no evidence of osseous metastatic disease.   -- MRI of the abdomen on  04/09/24 (to further evaluated the indeterminate right hepatic lesion) demonstrated the fluid signal lesion of the posterior liver dome (located in hepatic segment VII) measuring 0.9 cm with slow, progressive peripheral contrast enhancement, in keeping with a benign hemangioma. An additional very similar tiny subcapsular hemangioma of the anterior right lobe of the liver was also demonstrated, located in hepatic segment VIII, measuring 0.6 cm. No additional follow-up is needed for these findings. MRI also showed no evidence of  lymphadenopathy or metastatic disease in the abdomen.  Systemic therapy, if applicable, involved (dates and therapy as follows): She has been treated with neoadjuvant chemotherapy with dose dense Adriamycin  and Cytoxan  x 4 cycles from 04/06/24 through 05/19/24, followed by weekly taxol . She unfortunately had a really rough time with chemotherapy, with her symptoms including severe pain throughout her whole body and also involving her eyes, tongue, and stomach. She also developed weakness, difficulties moving and breathing comfortably, vomiting and emotional distress secondary to her symptoms. Taxol  was also discontinued on 07/20/24 s/p 8 cycles of treatment due to progressive peripheral neuropathy.   She then presented for a bilateral breast MRI on 07/21/24 which demonstrated evidence of an overall treatment response in the left breast masses, characterized by:  a decrease in size of the 3 o'clock left breast mass from 2.2 x 1.7 cm to 2.1 x 1.1 cm; a decrease in size of the 2 o'clock left breast mass from 9 mm to 7 mm, and a decrease in size of a 6 mm metastatic lymph node measuring 6 mm, previously measuring 7 mm. (Two other small masses and hazy non-mass  enhancement were also demonstrated between the two primary biopsy proven left breast carcinomas, with the overall span of abnormal enhancement area measuring 5.2 cm anteriorly to posteriorly). Imaging otherwise showed no other sites of  malignancy in the left breast, no evidence of malignancy in the right breast, and no other evidence axillary lymphadenopathy bilaterally.   After recovering from her systemic therapy course, she opted to proceed with a left breast lumpectomy with left axillary SLN excisions on 08/23/24 under the care of Dr. Aron. Pathology from the procedure revealed: tumors size of 1.1 cm and 1.9 cm; histology of grade 1 invasive ductal carcinoma in 2 separate tumors with intermediate grade DCIS; negative for LVI and PNI; all margins negative for invasive and in situ carcinoma; margin status to invasive disease of 5 mm from the posterior margin; margin status to in situ disease of less than 0.5 mm from the posterior margin; nodal status of 5/5 left axillary SLN excisions positive for metastatic carcinoma with focal extranodal extension present in all nodes (with the largest metastatic nodal deposit measuring 9 mm). Prognostic indicators (from the initial biopsy specimen) significant for : ER status: 95% positive and PR status 100% positive, both with strong staining intensity: Proliferation marker Ki67 at 15%; Her2 status negative; Grade 1.  Given her significant nodal involvement, she underwent a left axillary lymphadenectomy on 09/14/24. Pathology from the procedure revealed: metastatic carcinoma in 03/17 excised left axillary lymph nodes; negative for extranodal extension, with the largest metastatic focus measuring 2.5 mm.   Approximately 1 week after her procedure, she presented to the ED on 09/24/24 with a possible port site infection, generalized weakness, fatigue, nausea, and vomiting. She was ultimately admitted for management of sepsis secondary to an infected post-op hematoma with cellulitis at the surgical site. Her hospital course consisted of linezolid  which she tolerated well without any issues, and evacuation and irrigation of the infected left axillary hematoma under Dr. Aron on 09/28/24. She was  discharged home on 10/04/24 with a 2 week course of linezolid .   -- Imaging performed during this admission included a chest CT w/ contrast on 09/24/24 that demonstrated postsurgical changes in the left axilla with generalized edema and a surgical drain extending along the left chest wall, and a soft tissue density measuring approximately 7.0 x 3.8 cm posterolaterally within the left breast, likely representing the hematoma.   -- Left axillary US  on 09/30/24 showed a complex multiloculated fluid collection within the left axilla measuring 6.2 x 2.8 x 8.0 cm, with multiple internal septa and debris, located within the subcutaneous soft tissues (without significant surrounding hyperemia).   She will return to Dr. Gudena after she completes XRT to start antiestrogen therapy.   Symptomatically, the patient reports: ROM issues in left shoulder, seeing PT  Healed from post op infection.        ALLERGIES:  is allergic to morphine  and porcine (pork) protein-containing drug products.  Meds: Current Outpatient Medications  Medication Sig Dispense Refill   oxyCODONE  (OXY IR/ROXICODONE ) 5 MG immediate release tablet Take 1-2 tablets (5-10 mg total) by mouth every 6 (six) hours as needed. 30 tablet 0   polyethylene glycol powder (GLYCOLAX /MIRALAX ) 17 GM/SCOOP powder Take 17 g by mouth daily as needed for mild constipation or moderate constipation. Dissolve 1 capful (17g) in 4-8 ounces of liquid and take by mouth daily. 238 g 0   No current facility-administered medications for this encounter.    Physical Findings:  height is 5' (1.524 m) and weight is 174  lb (78.9 kg). Her temperature is 98.1 F (36.7 C). Her blood pressure is 139/82 and her pulse is 100. Her respiration is 20 and oxygen saturation is 100%. .     General: Alert and oriented, in no acute distress HEENT: Head is normocephalic. Extraocular movements are intact.  Neurologic: No obvious focalities. Speech is fluent.  Psychiatric: Judgment  and insight are intact. Affect is appropriate. MUSCULOSKELETAL: Left shoulder abduction past 90 degrees. SKIN: Scar in lateral left breast and axillary region healing well. Breasts: no sign of infection or healing complication in left breast/axilla  Lab Findings: Lab Results  Component Value Date   WBC 6.6 10/04/2024   HGB 12.9 10/04/2024   HCT 40.9 10/04/2024   MCV 79.3 (L) 10/04/2024   PLT 436 (H) 10/04/2024    @LASTCHEMISTRY @  Radiographic Findings: US  AXILLA LEFT Result Date: 09/30/2024 EXAM: US  LEFT UPPER EXTREMITY NONVASCULAR SOFT TISSUE 09/30/2024 07:25:36 PM TECHNIQUE: Real-time ultrasound scan of the left upper extremity with image documentation. COMPARISON: None available. CLINICAL HISTORY: Swelling in left armpit. FINDINGS: SOFT TISSUES: A complex multiloculated fluid collection is seen within the left axilla measuring 6.2 x 2.8 x 8.0 cm demonstrating multiple internal septa and debris. No significant surrounding hyperemia. The superior most and inferior most extent of the collection is not fully included on this examination, however, the collection appears entirely located within the subcutaneous soft tissues. IMPRESSION: 1. Complex multiloculated fluid collection within the left axilla measuring 6.2 x 2.8 x 8.0 cm, with multiple internal septa and debris, located within the subcutaneous soft tissues. No significant surrounding hyperemia. 2. The superior and inferior extents of the collection are not fully included on this examination. Electronically signed by: Dorethia Molt MD 09/30/2024 09:35 PM EST RP Workstation: [id number]   CT Chest W Contrast Result Date: 09/24/2024 EXAM: CT CHEST WITH CONTRAST 09/24/2024 09:35:42 PM TECHNIQUE: CT of the chest was performed with the administration of 70 mL of iohexol  (OMNIPAQUE ) 300 MG/ML solution. Multiplanar reformatted images are provided for review. Automated exposure control, iterative reconstruction, and/or weight based adjustment of  the mA/kV was utilized to reduce the radiation dose to as low as reasonably achievable. COMPARISON: MRI of the breast from 07/21/2024 CLINICAL HISTORY: Chest wall mass, US maximiano nondiagnostic. FINDINGS: MEDIASTINUM: Thoracic aorta appears within normal limits. No cardiac enlargement is noted. Pulmonary artery appears within normal limits. The esophagus is within normal limits. LYMPH NODES: No mediastinal, hilar or axillary lymphadenopathy. LUNGS AND PLEURA: The lungs are well aerated bilaterally. No focal infiltrate. SOFT TISSUES/BONES: Bony structures are within normal limits. Postsurgical changes are noted in the left axilla consistent with the given clinical history. Generalized edema is noted in the operative site. A surgical drain is seen extending along the left chest wall. There is a soft tissue density which measures approximately 7.0 x 3.8 cm posterior laterally within the left breast likely representing a hematoma given the recent surgical intervention. This is removed from the drainage catheter. UPPER ABDOMEN: Visualized upper abdomen shows changes of prior cholecystectomy. IMPRESSION: 1. Postsurgical changes in the left axilla with generalized edema and a surgical drain extending along the left chest wall. 2. Soft tissue density measuring approximately 7.0 x 3.8 cm posterolaterally within the left breast, likely representing a hematoma given recent surgery and separate from the drainage catheter. Electronically signed by: Oneil Devonshire MD 09/24/2024 09:52 PM EST RP Workstation: [id number]    Impression/Plan:  Cancer Staging  Malignant neoplasm of upper-outer quadrant of left breast in female, estrogen receptor positive (HCC) Staging form:  Breast, AJCC 8th Edition - Clinical: Stage IIA (cT2, cN1, cM0, G1, ER+, PR+, HER2-) - Signed by Odean Potts, MD on 03/30/2024 Stage prefix: Initial diagnosis Histologic grading system: 3 grade system - Pathologic stage from 10/05/2024: ypT1c, ypN2a, cM0, G1, ER+,  PR+, HER2- - Signed by Izell Domino, MD on 10/05/2024 Stage prefix: Post-therapy Histologic grading system: 3 grade system   We discussed adjuvant radiotherapy today.  I recommend radiation therapy to the breast and regional nodes in order to minimize risk of loco regional recurrence.  I reviewed the logistics, benefits, risks, and potential side effects of this treatment in detail. Risks may include but not necessary be limited to acute and late injury tissue in the radiation fields such as skin irritation (change in color/pigmentation, itching, dryness, pain, peeling). She may experience fatigue. We also discussed possible risk of long term cosmetic changes or scar tissue. There is also a smaller risk for lung toxicity, cardiac toxicity, brachial plexopathy, lymphedema, musculoskeletal changes, rib fragility or induction of a second malignancy, late chronic non-healing soft tissue wound.    The patient asked good questions which I answered to her satisfaction. She is enthusiastic about proceeding with treatment. A consent form has been  signed and placed in her chart.   We will proceed with her treatment planning today and start her treatment next week  Postoperative care following breast cancer surgery Postoperative healing progressing well, no infection. Scar healing appropriately. Physical therapy ongoing for shoulder mobility. - Continue physical therapy to improve shoulder mobility and prevent complications.  On date of service, in total, I spent 30 minutes on this encounter. Patient was seen in person.  _____________________________________   Domino Izell, MD  This document serves as a record of services personally performed by Domino Izell, MD. It was created on her behalf by Dorthy Fuse, a trained medical scribe. The creation of this record is based on the scribe's personal observations and the provider's statements to them. This document has been checked and approved by the  attending provider.

## 2024-10-21 ENCOUNTER — Ambulatory Visit
Admission: RE | Admit: 2024-10-21 | Discharge: 2024-10-21 | Disposition: A | Source: Ambulatory Visit | Attending: Radiation Oncology | Admitting: Radiation Oncology

## 2024-10-21 ENCOUNTER — Ambulatory Visit: Admission: RE | Admit: 2024-10-21 | Discharge: 2024-10-21 | Attending: Radiation Oncology

## 2024-10-21 VITALS — BP 139/82 | HR 100 | Temp 98.1°F | Resp 20 | Ht 60.0 in | Wt 174.0 lb

## 2024-10-21 DIAGNOSIS — C50412 Malignant neoplasm of upper-outer quadrant of left female breast: Secondary | ICD-10-CM

## 2024-10-21 DIAGNOSIS — Z170 Estrogen receptor positive status [ER+]: Secondary | ICD-10-CM | POA: Diagnosis present

## 2024-10-25 ENCOUNTER — Ambulatory Visit: Admitting: Internal Medicine

## 2024-10-25 ENCOUNTER — Ambulatory Visit

## 2024-10-25 DIAGNOSIS — C50412 Malignant neoplasm of upper-outer quadrant of left female breast: Secondary | ICD-10-CM | POA: Diagnosis not present

## 2024-10-26 ENCOUNTER — Other Ambulatory Visit: Payer: Self-pay

## 2024-10-26 ENCOUNTER — Ambulatory Visit
Admission: RE | Admit: 2024-10-26 | Discharge: 2024-10-26 | Disposition: A | Discharge status: Home / Self Care | Source: Ambulatory Visit | Attending: Radiation Oncology | Admitting: Radiation Oncology

## 2024-10-26 DIAGNOSIS — C50412 Malignant neoplasm of upper-outer quadrant of left female breast: Secondary | ICD-10-CM | POA: Diagnosis not present

## 2024-10-26 LAB — RAD ONC ARIA SESSION SUMMARY
Course Elapsed Days: 0
Plan Fractions Treated to Date: 1
Plan Fractions Treated to Date: 1
Plan Prescribed Dose Per Fraction: 2 Gy
Plan Prescribed Dose Per Fraction: 2 Gy
Plan Total Fractions Prescribed: 25
Plan Total Fractions Prescribed: 25
Plan Total Prescribed Dose: 50 Gy
Plan Total Prescribed Dose: 50 Gy
Reference Point Dosage Given to Date: 2 Gy
Reference Point Dosage Given to Date: 2 Gy
Reference Point Session Dosage Given: 2 Gy
Reference Point Session Dosage Given: 2 Gy
Session Number: 1

## 2024-10-27 ENCOUNTER — Ambulatory Visit

## 2024-10-27 ENCOUNTER — Other Ambulatory Visit: Payer: Self-pay

## 2024-10-27 ENCOUNTER — Ambulatory Visit
Admission: RE | Admit: 2024-10-27 | Discharge: 2024-10-27 | Disposition: A | Discharge status: Home / Self Care | Source: Ambulatory Visit | Attending: Radiation Oncology | Admitting: Radiation Oncology

## 2024-10-27 DIAGNOSIS — L7682 Other postprocedural complications of skin and subcutaneous tissue: Secondary | ICD-10-CM

## 2024-10-27 DIAGNOSIS — M25612 Stiffness of left shoulder, not elsewhere classified: Secondary | ICD-10-CM | POA: Diagnosis not present

## 2024-10-27 DIAGNOSIS — Z170 Estrogen receptor positive status [ER+]: Secondary | ICD-10-CM

## 2024-10-27 DIAGNOSIS — Z9189 Other specified personal risk factors, not elsewhere classified: Secondary | ICD-10-CM

## 2024-10-27 DIAGNOSIS — C50412 Malignant neoplasm of upper-outer quadrant of left female breast: Secondary | ICD-10-CM | POA: Diagnosis not present

## 2024-10-27 DIAGNOSIS — R293 Abnormal posture: Secondary | ICD-10-CM

## 2024-10-27 LAB — RAD ONC ARIA SESSION SUMMARY
Course Elapsed Days: 1
Plan Fractions Treated to Date: 2
Plan Fractions Treated to Date: 2
Plan Prescribed Dose Per Fraction: 2 Gy
Plan Prescribed Dose Per Fraction: 2 Gy
Plan Total Fractions Prescribed: 25
Plan Total Fractions Prescribed: 25
Plan Total Prescribed Dose: 50 Gy
Plan Total Prescribed Dose: 50 Gy
Reference Point Dosage Given to Date: 4 Gy
Reference Point Dosage Given to Date: 4 Gy
Reference Point Session Dosage Given: 2 Gy
Reference Point Session Dosage Given: 2 Gy
Session Number: 2

## 2024-10-27 NOTE — Therapy (Signed)
 OUTPATIENT PHYSICAL THERAPY  UPPER EXTREMITY ONCOLOGY POST OP VISIT  Patient Name: Wanda Garcia MRN: [id number] DOB:[birth year], 48 y.o., female Today's Date: 10/27/2024  END OF SESSION:  PT End of Session - 10/27/24 0802     Visit Number 5    Number of Visits 14    Date for Recertification  11/23/24    Authorization Type [employer]    Authorization Time Period 11/26-2/24/2025    Authorization - Visit Number 4    Authorization - Number of Visits 15    PT Start Time 0803    PT Stop Time 0854    PT Time Calculation (min) 51 min    Activity Tolerance Patient tolerated treatment well    Behavior During Therapy [facility] for tasks assessed/performed          Past Medical History:  Diagnosis Date   Arthritis    Breast cancer (HCC) 03/2024   left   Depression    situational   Headache    Neuropathy 07/20/2024   in fingers and toes   Past Surgical History:  Procedure Laterality Date   AXILLARY LYMPH NODE DISSECTION Left 09/14/2024   Procedure: LYMPHADENECTOMY, AXILLARY;  Surgeon: Aron Shoulders, MD;  Location: [facility] OR;  Service: General;  Laterality: Left;  LEFT AXILLARY LYMPH NODE DISSECTION GEN w/PEC BLOCK   AXILLARY SENTINEL NODE BIOPSY Left 08/23/2024   Procedure: LEFT SENTINEL NODE BIOPSY AXILLARY;  Surgeon: Aron Shoulders, MD;  Location: [facility];  Service: General;  Laterality: Left;  LEFT SENTINEL NODE BIOPSY   BREAST BIOPSY Left 03/2024   CESAREAN SECTION N/A 04/06/2017   Procedure: CESAREAN SECTION;  Surgeon: Herchel Gloris LABOR, MD;  Location: [facility];  Service: Obstetrics;  Laterality: N/A;   CHOLECYSTECTOMY N/A 04/26/2019   Procedure: LAPAROSCOPIC CHOLECYSTECTOMY WITH POSSIBLE INTRAOPERATIVE CHOLANGIOGRAM;  Surgeon: Vernetta Berg, MD;  Location: [facility];  Service: General;  Laterality: N/A;   CYST EXCISION Right 2024   chest   IRRIGATION AND DEBRIDEMENT HEMATOMA Left 09/28/2024   Procedure: IRRIGATION AND DEBRIDEMENT HEMATOMA;  Surgeon: Aron Shoulders, MD;   Location: [facility];  Service: General;  Laterality: Left;  washout left axilla   PORT-A-CATH REMOVAL N/A 08/23/2024   Procedure: REMOVAL PORT-A-CATH;  Surgeon: Aron Shoulders, MD;  Location: [facility];  Service: General;  Laterality: N/A;   PORTACATH PLACEMENT N/A 04/04/2024   Procedure: INSERTION, TUNNELED CENTRAL VENOUS DEVICE, WITH PORT;  Surgeon: Aron Shoulders, MD;  Location: [facility];  Service: General;  Laterality: N/A;   Patient Active Problem List   Diagnosis Date Noted   Sepsis (HCC) 09/24/2024   Port-A-Cath in place 04/06/2024   Malignant neoplasm of upper-outer quadrant of left breast in female, estrogen receptor positive (HCC) 03/28/2024   Neck pain 07/09/2018   Trigger finger of left thumb 07/09/2018   Pain in left wrist 07/09/2018   Pain in right wrist 07/09/2018   S/P cesarean section 04/05/2017   Advanced maternal age, primigravida in first trimester, antepartum     PCP:   REFERRING PROVIDER: Mackey Chad, MD  REFERRING DIAG: Left breast Cancer, at risk for lymphedema  THERAPY DIAG:  Stiffness of left shoulder, not elsewhere classified  Axillary web syndrome  At risk for lymphedema  Abnormal posture  Malignant neoplasm of upper-outer quadrant of left breast in female, estrogen receptor positive (HCC)  ONSET DATE: 08/23/2024  Rationale for Evaluation and Treatment: Rehabilitation  SUBJECTIVE:  SUBJECTIVE STATEMENT:  I started radiation yesterday . My left lateral arm is hurting since my son with autism hugged me last night.(Lateral arm). Cording is improving overall. My left breast is a little sore today. Sometimes I get shooting pains in my breast. She has not ordered her sleeve yet, but she will.  PERTINENT HISTORY:  Pt is s/p Neoadjuvant chemotherapy and Left lumpectomy with surgery  performed on 08/23/2024 for 2 primary tumors. She had 5+/5 LN's removed at that time. She had an ALND on 09/14/2024 with 3+/17 LN's for a total of 8+/22 LN's. She was hospitalized from 09/24/2024-10/04/2024 for sepsis/cellulitis at the surgical site. She had her drain removed yesterday. 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 PAIN:  Are you having pain? Yes NPRS scale: 4/10 left lateral arm Pain location: L chest Pain orientation: Left  PAIN TYPE: tight Pain description: intermittent  Aggravating factors: walking, reaching Relieving factors: resting  PRECAUTIONS: Right UE Lymphedema risk due to 8+/22 LN's removed, neuropathy  RED FLAGS: None   WEIGHT BEARING RESTRICTIONS: No  FALLS:  Has patient fallen in last 6 months? No  LEISURE: cares of son who is autistic  HAND DOMINANCE: right   PRIOR LEVEL OF FUNCTION: Independent  PATIENT GOALS: return to PLOF   OBJECTIVE: Note: Objective measures were completed at Evaluation unless otherwise noted.  COGNITION: Overall cognitive status: Within functional limits for tasks assessed   PALPATION: Very tender/sensitive Left UE    OBSERVATIONS / OTHER ASSESSMENTS: large breast incision healed, axillary incision covered with bandage;just had drain removed yesterday. When bandage removed and reapplied this am very little on bandage. Palpable cording left  axilla into forearm and pt very sensitive to light touch. No signs of UE lymphedema today  SENSATION: Light touch: Deficits     POSTURE: forward head, rounded shoulders   UPPER EXTREMITY STRENGTH:   UPPER EXTREMITY AROM/PROM:   A/PROM RIGHT   eval    Shoulder extension 47  Shoulder flexion 149  Shoulder abduction 154  Shoulder internal rotation 55  Shoulder external rotation 80                          (Blank rows = not tested)   A/PROM LEFT   eval LEFT LEFT  Shoulder extension 40 32   Shoulder flexion 147 54, P! 130 P!  Shoulder abduction 167 40, P! 103 P!  Shoulder  internal rotation 56    Shoulder external rotation 84                            (Blank rows = not tested)   CERVICAL AROM: All within normal limits     UPPER EXTREMITY STRENGTH: WNL   LYMPHEDEMA ASSESSMENTS (in cm):    LANDMARK RIGHT   eval RIGHT 10/12/2024  10 cm proximal to olecranon process 30.2 30.2  Olecranon process 24 24.5  10 cm proximal to ulnar styloid process 21.5 21.1  Just proximal to ulnar styloid process 14.2 14.7  Across hand at thumb web space 17.3 17.7  At base of 2nd digit 5.9 6.2  (Blank rows = not tested)   LANDMARK LEFT   eval LEFT 10/12/2024   10 cm proximal to olecranon process 28.8 29.96  Olecranon process 22.9 22.9  10 cm proximal to ulnar styloid process 20.8 21  Just proximal to ulnar styloid process 14.1 14.5  Across hand at thumb web space 17.5 17.7  At base of 2nd digit 6 6.2  (Blank rows = not tested)    QUICK DASH SURVEY: 66%                                                                                                                            TREATMENT DATE:   10/27/2024 Discussed progress Pulleys flexion and abduction x 2 min ea Supine wand flex and scaption x 5 , 5 sec hold Gentle MFR to cording in left UE. PROM left shoulder flex, scaption, abd, ER 3 D AROM bilaterally flexion, scaption, horizontal abd x 5, snow angels x 5 LTR Bilaterally with arms outstretched x 5 ea Corner pectoral stretch B x 3 B Updated HEP 10/20/2024 Discussed compression garment with pt. And she would like to have one for lymphedema prevention. Discussed purchasing on compression [employer]uru, vs [employer]. Discussed wearing for repetitive activities and flying Pulleys x 2 min flexion and scaption with VC not to force Gentle MFR to cording in left UE. Supine  STM to left UT, pectorals and lateral trunk with cocoa butter PROM left shoulder flex, scaption, abd, ER 3 D AROM bilaterally flexion, scaption, horizontal abd x 5 Showed pt how to stretch  cords at elbow and with wrist. Gave pt info to order on compression guru [employer] dynamic Size 1 Max sleeve with silicone border, and size 1 gauntlet or may go to [employer] to purchase. She will speak with her husband 10/18/24: Remeasured shoulder ROM Therapeutic Exercise: Pulleys x 2 min in direction of flexion and 2 min in direction of abduction with pt returning therapist demo with v/c for proper form and to keep from hiking L shoulder Ball up wall x 10 reps in to flexion and 5 reps in to L shoulder abduction (stopped at 5 reps due to pain) with pt returning therapist demo Manual Therapy: In supine: PROM to L shoulder in to flexion, abduction, IR and ER with performing gentle MFR to cording from axilla to forearm while educating pt about cording and importance of constant stretching In R side lying: STM to L upper traps and rhomboids using cocoa butter with numerous areas of muscle tightness palpable   09/22/2024 Pt was instructed in 4 post op exercises; supine flexion and stargazer and sitting or standing scapular retraction, and wall slide for abd. She performed each for 5 reps holding 3-5 seconds ea. Multiple VC's given so pt doesn't push through pain. Advised pt she does not have lymphedema presently. Will need to discss more in next few visits.Discussed POC, LOS, treatment interventions. Pt notes her husband works so it is best for her to have am appts.   PATIENT EDUCATION:  Access Code: [email or web address] URL: [email or web address] Date: 10/27/2024 Prepared by: Grayce Sheldon  Exercises - Supine Shoulder Flexion Extension AAROM with Dowel  - 2 x daily - 7 x weekly - 1 sets - 5 reps - 5 hold - Supine Lower Trunk Rotation  - 1 x  daily - 7 x weekly - 1 sets - 5 reps - 10 hold - Corner Pec Major Stretch  - 2 x daily - 7 x weekly - 1 sets - 3 reps - 15 hold Education details: Pt was instructed in 4 post op exercises; supine flexion and stargazer and sitting or standing scapular  retraction, and wall slide for abd. She performed each for 5 reps holding 3-5 seconds ea Person educated: Patient Education method: Explanation, Demonstration, and Handouts Education comprehension: verbalized understanding and returned demonstration  HOME EXERCISE PROGRAM: 4 post op exercises; flexion and stargazer in supine, scapular retraction sitting or standing, wall slides for abd in standing Supine wand, corner pec stretch, LTR ASSESSMENT:  CLINICAL IMPRESSION: Several very tight cords still present in axilla and running down arm.Release techniques still sensitive, but ROM visibly improved. Lateral shoulder pain abolished after therapy.   OBJECTIVE IMPAIRMENTS: decreased activity tolerance, decreased knowledge of condition, decreased ROM, decreased strength, impaired sensation, impaired UE functional use, postural dysfunction, and pain.   ACTIVITY LIMITATIONS: carrying, lifting, sleeping, bed mobility, dressing, reach over head, hygiene/grooming, and caring for others  PARTICIPATION LIMITATIONS: cleaning, laundry, community activity, and caring for her autistic child  PERSONAL FACTORS: 3+ comorbidities: left breast cancer s/p neoadjuvant chemo therapy, several surgeries, sepsis, 22 LN's removed, pending radiation are also affecting patient's functional outcome.   REHAB POTENTIAL: Good  CLINICAL DECISION MAKING: Evolving/moderate complexity  EVALUATION COMPLEXITY: Moderate  GOALS: Goals reviewed with patient? Yes    LONG TERM GOALS: (STG=LTG)      Name Target Date Goal status  1 Pt will be able to verbalize understanding of pertinent lymphedema risk reduction practices relevant to her dx specifically related to skin care.  Baseline:  No knowledge 03/30/2024 Achieved at eval  2 Pt will be able to return demo and/or verbalize understanding of the post op HEP related to regaining shoulder ROM. Baseline:  No knowledge 03/30/2024 Achieved at eval  3 Pt will be able to verbalize  understanding of the importance of viewing the post op After Breast CA Class video for further lymphedema risk reduction education and therapeutic exercise.  Baseline:  No knowledge 03/30/2024 Achieved at eval  4 Pt will demo she has regained full shoulder ROM and function post operatively compared to baselines.  Baseline: See objective measurements taken today. 11/24/2023 RE-EVAL    5   Pt will have decreased pain by 60% or greater for improved daily activities 11/24/2023 RE-EVAL    6    Pt will be educated in precautions/risk factors for lymphedema and importance of SOZO screens, compression sleeve as preventative 11/24/2023 RE-EVAL          PLAN:  PT FREQUENCY: 2x/week  PT DURATION: 6 weeks  PLANNED INTERVENTIONS: 97164- PT Re-evaluation, 97750- Physical Performance Testing, 97110-Therapeutic exercises, 97530- Therapeutic activity, 97112- Neuromuscular re-education, 97535- Self Care, [id number]- Manual therapy, [id number]- Gait training, [phone]- Orthotic Initial, [phone]- Orthotic/Prosthetic subsequent, and Patient/Family education  PLAN FOR NEXT SESSION: set up SOZO screen around Jan 7, Did she order sleeve?, (may not be covered as preventative), Review HEP, manual techniques for cording, PROM, STM prn . Progress as tolerated. See hx  Grayce JINNY Sheldon, PT 10/27/2024, 8:55 AM

## 2024-10-28 ENCOUNTER — Other Ambulatory Visit: Payer: Self-pay

## 2024-10-28 ENCOUNTER — Ambulatory Visit
Admission: RE | Admit: 2024-10-28 | Discharge: 2024-10-28 | Disposition: A | Source: Ambulatory Visit | Attending: Radiation Oncology

## 2024-10-28 DIAGNOSIS — C50412 Malignant neoplasm of upper-outer quadrant of left female breast: Secondary | ICD-10-CM | POA: Diagnosis not present

## 2024-10-28 LAB — RAD ONC ARIA SESSION SUMMARY
Course Elapsed Days: 2
Plan Fractions Treated to Date: 3
Plan Fractions Treated to Date: 3
Plan Prescribed Dose Per Fraction: 2 Gy
Plan Prescribed Dose Per Fraction: 2 Gy
Plan Total Fractions Prescribed: 25
Plan Total Fractions Prescribed: 25
Plan Total Prescribed Dose: 50 Gy
Plan Total Prescribed Dose: 50 Gy
Reference Point Dosage Given to Date: 6 Gy
Reference Point Dosage Given to Date: 6 Gy
Reference Point Session Dosage Given: 2 Gy
Reference Point Session Dosage Given: 2 Gy
Session Number: 3

## 2024-10-31 ENCOUNTER — Ambulatory Visit
Admission: RE | Admit: 2024-10-31 | Discharge: 2024-10-31 | Disposition: A | Source: Ambulatory Visit | Attending: Radiation Oncology

## 2024-10-31 ENCOUNTER — Other Ambulatory Visit: Payer: Self-pay

## 2024-10-31 DIAGNOSIS — C50412 Malignant neoplasm of upper-outer quadrant of left female breast: Secondary | ICD-10-CM

## 2024-10-31 LAB — RAD ONC ARIA SESSION SUMMARY
Course Elapsed Days: 5
Plan Fractions Treated to Date: 4
Plan Fractions Treated to Date: 4
Plan Prescribed Dose Per Fraction: 2 Gy
Plan Prescribed Dose Per Fraction: 2 Gy
Plan Total Fractions Prescribed: 25
Plan Total Fractions Prescribed: 25
Plan Total Prescribed Dose: 50 Gy
Plan Total Prescribed Dose: 50 Gy
Reference Point Dosage Given to Date: 8 Gy
Reference Point Dosage Given to Date: 8 Gy
Reference Point Session Dosage Given: 2 Gy
Reference Point Session Dosage Given: 2 Gy
Session Number: 4

## 2024-10-31 MED ORDER — RADIAPLEXRX EX GEL: Freq: Once | CUTANEOUS | Status: AC

## 2024-10-31 MED ORDER — ALRA NON-METALLIC DEODORANT (RAD-ONC)
1.0000 | Freq: Once | TOPICAL | Status: AC
  Administered 2024-10-31: 16:00:00 1 via TOPICAL

## 2024-11-01 ENCOUNTER — Ambulatory Visit

## 2024-11-01 ENCOUNTER — Other Ambulatory Visit: Payer: Self-pay

## 2024-11-01 ENCOUNTER — Ambulatory Visit
Admission: RE | Admit: 2024-11-01 | Discharge: 2024-11-01 | Disposition: A | Source: Ambulatory Visit | Attending: Radiation Oncology

## 2024-11-01 DIAGNOSIS — Z9189 Other specified personal risk factors, not elsewhere classified: Secondary | ICD-10-CM

## 2024-11-01 DIAGNOSIS — R293 Abnormal posture: Secondary | ICD-10-CM

## 2024-11-01 DIAGNOSIS — L905 Scar conditions and fibrosis of skin: Secondary | ICD-10-CM

## 2024-11-01 DIAGNOSIS — C50412 Malignant neoplasm of upper-outer quadrant of left female breast: Secondary | ICD-10-CM | POA: Diagnosis not present

## 2024-11-01 DIAGNOSIS — M25612 Stiffness of left shoulder, not elsewhere classified: Secondary | ICD-10-CM | POA: Diagnosis not present

## 2024-11-01 LAB — RAD ONC ARIA SESSION SUMMARY
Course Elapsed Days: 6
Plan Fractions Treated to Date: 5
Plan Fractions Treated to Date: 5
Plan Prescribed Dose Per Fraction: 2 Gy
Plan Prescribed Dose Per Fraction: 2 Gy
Plan Total Fractions Prescribed: 25
Plan Total Fractions Prescribed: 25
Plan Total Prescribed Dose: 50 Gy
Plan Total Prescribed Dose: 50 Gy
Reference Point Dosage Given to Date: 10 Gy
Reference Point Dosage Given to Date: 10 Gy
Reference Point Session Dosage Given: 2 Gy
Reference Point Session Dosage Given: 2 Gy
Session Number: 5

## 2024-11-01 NOTE — Therapy (Signed)
 OUTPATIENT PHYSICAL THERAPY  UPPER EXTREMITY ONCOLOGY POST OP VISIT  Patient Name: Wanda Garcia MRN: [id number] DOB:10-Oct-1976, 48 y.o., female Today's Date: 11/01/2024  END OF SESSION:  PT End of Session - 11/01/24 0802     Visit Number 6    Number of Visits 14    Date for Recertification  11/23/24    Authorization Type [employer]    Authorization Time Period 11/26-2/24/2025    Authorization - Visit Number 6    Authorization - Number of Visits 15    PT Start Time 0803    PT Stop Time 0855    PT Time Calculation (min) 52 min    Activity Tolerance Patient tolerated treatment well    Behavior During Therapy [facility] for tasks assessed/performed          Past Medical History:  Diagnosis Date   Arthritis    Breast cancer (HCC) 03/2024   left   Depression    situational   Headache    Neuropathy 07/20/2024   in fingers and toes   Past Surgical History:  Procedure Laterality Date   AXILLARY LYMPH NODE DISSECTION Left 09/14/2024   Procedure: LYMPHADENECTOMY, AXILLARY;  Surgeon: Aron Shoulders, MD;  Location: [facility] OR;  Service: General;  Laterality: Left;  LEFT AXILLARY LYMPH NODE DISSECTION GEN w/PEC BLOCK   AXILLARY SENTINEL NODE BIOPSY Left 08/23/2024   Procedure: LEFT SENTINEL NODE BIOPSY AXILLARY;  Surgeon: Aron Shoulders, MD;  Location: [facility];  Service: General;  Laterality: Left;  LEFT SENTINEL NODE BIOPSY   BREAST BIOPSY Left 03/2024   CESAREAN SECTION N/A 04/06/2017   Procedure: CESAREAN SECTION;  Surgeon: Herchel Gloris LABOR, MD;  Location: [facility];  Service: Obstetrics;  Laterality: N/A;   CHOLECYSTECTOMY N/A 04/26/2019   Procedure: LAPAROSCOPIC CHOLECYSTECTOMY WITH POSSIBLE INTRAOPERATIVE CHOLANGIOGRAM;  Surgeon: Vernetta Berg, MD;  Location: [facility];  Service: General;  Laterality: N/A;   CYST EXCISION Right 2024   chest   IRRIGATION AND DEBRIDEMENT HEMATOMA Left 09/28/2024   Procedure: IRRIGATION AND DEBRIDEMENT HEMATOMA;  Surgeon: Aron Shoulders, MD;   Location: [facility];  Service: General;  Laterality: Left;  washout left axilla   PORT-A-CATH REMOVAL N/A 08/23/2024   Procedure: REMOVAL PORT-A-CATH;  Surgeon: Aron Shoulders, MD;  Location: [facility];  Service: General;  Laterality: N/A;   PORTACATH PLACEMENT N/A 04/04/2024   Procedure: INSERTION, TUNNELED CENTRAL VENOUS DEVICE, WITH PORT;  Surgeon: Aron Shoulders, MD;  Location: [facility];  Service: General;  Laterality: N/A;   Patient Active Problem List   Diagnosis Date Noted   Sepsis (HCC) 09/24/2024   Port-A-Cath in place 04/06/2024   Malignant neoplasm of upper-outer quadrant of left breast in female, estrogen receptor positive (HCC) 03/28/2024   Neck pain 07/09/2018   Trigger finger of left thumb 07/09/2018   Pain in left wrist 07/09/2018   Pain in right wrist 07/09/2018   S/P cesarean section 04/05/2017   Advanced maternal age, primigravida in first trimester, antepartum     PCP:   REFERRING PROVIDER: Mackey Chad, MD  REFERRING DIAG: Left breast Cancer, at risk for lymphedema  THERAPY DIAG:  Stiffness of left shoulder, not elsewhere classified  Axillary web syndrome  At risk for lymphedema  Abnormal posture  Malignant neoplasm of upper-outer quadrant of left breast in female, estrogen receptor positive (HCC)  ONSET DATE: 08/23/2024  Rationale for Evaluation and Treatment: Rehabilitation  SUBJECTIVE:  SUBJECTIVE STATEMENT:   I am getting itchy in the place of the drain from the radiation. The cording is better, but gets tight sometimes. The pain at the lateral shoulder came back yesterday after cleaning, and the end of the day.  PERTINENT HISTORY:  Pt is s/p Neoadjuvant chemotherapy and Left lumpectomy with surgery performed on 08/23/2024 for 2 primary tumors. She had 5+/5 LN's removed at that  time. She had an ALND on 09/14/2024 with 3+/17 LN's for a total of 8+/22 LN's. She was hospitalized from 09/24/2024-10/04/2024 for sepsis/cellulitis at the surgical site. She had her drain removed yesterday. 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 PAIN:  Are you having pain? Yes NPRS scale: 4/10 left lateral arm Pain location: L chest Pain orientation: Left  PAIN TYPE: tight Pain description: intermittent  Aggravating factors: walking, reaching Relieving factors: resting  PRECAUTIONS: Right UE Lymphedema risk due to 8+/22 LN's removed, neuropathy  RED FLAGS: None   WEIGHT BEARING RESTRICTIONS: No  FALLS:  Has patient fallen in last 6 months? No  LEISURE: cares of son who is autistic  HAND DOMINANCE: right   PRIOR LEVEL OF FUNCTION: Independent  PATIENT GOALS: return to PLOF   OBJECTIVE: Note: Objective measures were completed at Evaluation unless otherwise noted.  COGNITION: Overall cognitive status: Within functional limits for tasks assessed   PALPATION: Very tender/sensitive Left UE    OBSERVATIONS / OTHER ASSESSMENTS: large breast incision healed, axillary incision covered with bandage;just had drain removed yesterday. When bandage removed and reapplied this am very little on bandage. Palpable cording left  axilla into forearm and pt very sensitive to light touch. No signs of UE lymphedema today  SENSATION: Light touch: Deficits     POSTURE: forward head, rounded shoulders   UPPER EXTREMITY STRENGTH:   UPPER EXTREMITY AROM/PROM:   A/PROM RIGHT   eval    Shoulder extension 47  Shoulder flexion 149  Shoulder abduction 154  Shoulder internal rotation 55  Shoulder external rotation 80                          (Blank rows = not tested)   A/PROM LEFT   eval LEFT LEFT  Shoulder extension 40 32   Shoulder flexion 147 54, P! 130 P!  Shoulder abduction 167 40, P! 103 P!  Shoulder internal rotation 56    Shoulder external rotation 84                             (Blank rows = not tested)   CERVICAL AROM: All within normal limits     UPPER EXTREMITY STRENGTH: WNL   LYMPHEDEMA ASSESSMENTS (in cm):    LANDMARK RIGHT   eval RIGHT 10/12/2024  10 cm proximal to olecranon process 30.2 30.2  Olecranon process 24 24.5  10 cm proximal to ulnar styloid process 21.5 21.1  Just proximal to ulnar styloid process 14.2 14.7  Across hand at thumb web space 17.3 17.7  At base of 2nd digit 5.9 6.2  (Blank rows = not tested)   LANDMARK LEFT   eval LEFT 10/12/2024   10 cm proximal to olecranon process 28.8 29.96  Olecranon process 22.9 22.9  10 cm proximal to ulnar styloid process 20.8 21  Just proximal to ulnar styloid process 14.1 14.5  Across hand at thumb web space 17.5 17.7  At base of 2nd digit 6 6.2  (Blank rows =  not tested)    QUICK DASH SURVEY: 66%                                                                                                                            TREATMENT DATE:   11/01/2024 Discussed progress with interpreter Hanane present Pulleys flexion and abduction x 2 min ea Supine on half roll;alternate shoulder flex x 5, scaption x 5, horizontal abd x 5, snow angels x 5 LTR with arms outstretched x 5 ea Corner wall stretch 3 x 20 sec Gentle MFR to cording in left UE. PROM left shoulder flex, scaption, abd, ER 10/27/2024 Discussed progress Pulleys flexion and abduction x 2 min ea Supine wand flex and scaption x 5 , 5 sec hold Gentle MFR to cording in left UE. PROM left shoulder flex, scaption, abd, ER 3 D AROM bilaterally flexion, scaption, horizontal abd x 5, snow angels x 5 LTR Bilaterally with arms outstretched x 5 ea Corner pectoral stretch B x 3 B Updated HEP 10/20/2024 Discussed compression garment with pt. And she would like to have one for lymphedema prevention. Discussed purchasing on compression guru, vs [employer]. Discussed wearing for repetitive activities and flying Pulleys x 2 min flexion and  scaption with VC not to force Gentle MFR to cording in left UE. Supine  STM to left UT, pectorals and lateral trunk with cocoa butter PROM left shoulder flex, scaption, abd, ER 3 D AROM bilaterally flexion, scaption, horizontal abd x 5 Showed pt how to stretch cords at elbow and with wrist. Gave pt info to order on compression guru J[employer]o dynamic Size 1 Max sleeve with silicone border, and size 1 gauntlet or may go to [employer] to purchase. She will speak with her husband 10/18/24: Remeasured shoulder ROM Therapeutic Exercise: Pulleys x 2 min in direction of flexion and 2 min in direction of abduction with pt returning therapist demo with v/c for proper form and to keep from hiking L shoulder Ball up wall x 10 reps in to flexion and 5 reps in to L shoulder abduction (stopped at 5 reps due to pain) with pt returning therapist demo Manual Therapy: In supine: PROM to L shoulder in to flexion, abduction, IR and ER with performing gentle MFR to cording from axilla to forearm while educating pt about cording and importance of constant stretching In R side lying: STM to L upper traps and rhomboids using cocoa butter with numerous areas of muscle tightness palpable   09/22/2024 Pt was instructed in 4 post op exercises; supine flexion and stargazer and sitting or standing scapular retraction, and wall slide for abd. She performed each for 5 reps holding 3-5 seconds ea. Multiple VC's given so pt doesn't push through pain. Advised pt she does not have lymphedema presently. Will need to discss more in next few visits.Discussed POC, LOS, treatment interventions. Pt notes her husband works so it is best for her to have am appts.  PATIENT EDUCATION:  Access Code: [email or web address] URL: [email or web address] Date: 10/27/2024 Prepared by: Grayce Sheldon  Exercises - Supine Shoulder Flexion Extension AAROM with Dowel  - 2 x daily - 7 x weekly - 1 sets - 5 reps - 5 hold - Supine Lower Trunk Rotation   - 1 x daily - 7 x weekly - 1 sets - 5 reps - 10 hold - Corner Pec Major Stretch  - 2 x daily - 7 x weekly - 1 sets - 3 reps - 15 hold Education details: Pt was instructed in 4 post op exercises; supine flexion and stargazer and sitting or standing scapular retraction, and wall slide for abd. She performed each for 5 reps holding 3-5 seconds ea Person educated: Patient Education method: Explanation, Demonstration, and Handouts Education comprehension: verbalized understanding and returned demonstration  HOME EXERCISE PROGRAM: 4 post op exercises; flexion and stargazer in supine, scapular retraction sitting or standing, wall slides for abd in standing Supine wand, corner pec stretch, LTR ASSESSMENT:  CLINICAL IMPRESSION: Pt very stiff initially and with left shoulder pain, but loosened up nicely and relaxed well for PROM. Cording still present but looser today.  OBJECTIVE IMPAIRMENTS: decreased activity tolerance, decreased knowledge of condition, decreased ROM, decreased strength, impaired sensation, impaired UE functional use, postural dysfunction, and pain.   ACTIVITY LIMITATIONS: carrying, lifting, sleeping, bed mobility, dressing, reach over head, hygiene/grooming, and caring for others  PARTICIPATION LIMITATIONS: cleaning, laundry, community activity, and caring for her autistic child  PERSONAL FACTORS: 3+ comorbidities: left breast cancer s/p neoadjuvant chemo therapy, several surgeries, sepsis, 22 LN's removed, pending radiation are also affecting patient's functional outcome.   REHAB POTENTIAL: Good  CLINICAL DECISION MAKING: Evolving/moderate complexity  EVALUATION COMPLEXITY: Moderate  GOALS: Goals reviewed with patient? Yes    LONG TERM GOALS: (STG=LTG)      Name Target Date Goal status  1 Pt will be able to verbalize understanding of pertinent lymphedema risk reduction practices relevant to her dx specifically related to skin care.  Baseline:  No knowledge 03/30/2024  Achieved at eval  2 Pt will be able to return demo and/or verbalize understanding of the post op HEP related to regaining shoulder ROM. Baseline:  No knowledge 03/30/2024 Achieved at eval  3 Pt will be able to verbalize understanding of the importance of viewing the post op After Breast CA Class video for further lymphedema risk reduction education and therapeutic exercise.  Baseline:  No knowledge 03/30/2024 Achieved at eval  4 Pt will demo she has regained full shoulder ROM and function post operatively compared to baselines.  Baseline: See objective measurements taken today. 11/24/2023 RE-EVAL    5   Pt will have decreased pain by 60% or greater for improved daily activities 11/24/2023 RE-EVAL    6    Pt will be educated in precautions/risk factors for lymphedema and importance of SOZO screens, compression sleeve as preventative 11/24/2023 RE-EVAL          PLAN:  PT FREQUENCY: 2x/week  PT DURATION: 6 weeks  PLANNED INTERVENTIONS: 97164- PT Re-evaluation, 97750- Physical Performance Testing, 97110-Therapeutic exercises, 97530- Therapeutic activity, 97112- Neuromuscular re-education, 97535- Self Care, [id number]- Manual therapy, [id number]- Gait training, [phone]- Orthotic Initial, [phone]- Orthotic/Prosthetic subsequent, and Patient/Family education  PLAN FOR NEXT SESSION: set up SOZO screen around Jan 7, Did she order sleeve?, (may not be covered as preventative), Review HEP, manual techniques for cording, PROM, STM prn . Progress as tolerated. See hx  Grayce JINNY Sheldon, PT 11/01/2024, 8:59 AM

## 2024-11-02 ENCOUNTER — Other Ambulatory Visit: Payer: Self-pay

## 2024-11-02 ENCOUNTER — Ambulatory Visit: Admission: RE | Admit: 2024-11-02 | Discharge: 2024-11-02 | Attending: Radiation Oncology

## 2024-11-02 DIAGNOSIS — C50412 Malignant neoplasm of upper-outer quadrant of left female breast: Secondary | ICD-10-CM | POA: Diagnosis not present

## 2024-11-02 LAB — RAD ONC ARIA SESSION SUMMARY
Course Elapsed Days: 7
Plan Fractions Treated to Date: 6
Plan Fractions Treated to Date: 6
Plan Prescribed Dose Per Fraction: 2 Gy
Plan Prescribed Dose Per Fraction: 2 Gy
Plan Total Fractions Prescribed: 25
Plan Total Fractions Prescribed: 25
Plan Total Prescribed Dose: 50 Gy
Plan Total Prescribed Dose: 50 Gy
Reference Point Dosage Given to Date: 12 Gy
Reference Point Dosage Given to Date: 12 Gy
Reference Point Session Dosage Given: 2 Gy
Reference Point Session Dosage Given: 2 Gy
Session Number: 6

## 2024-11-03 ENCOUNTER — Ambulatory Visit: Admission: RE | Admit: 2024-11-03

## 2024-11-03 ENCOUNTER — Ambulatory Visit

## 2024-11-03 ENCOUNTER — Other Ambulatory Visit: Payer: Self-pay

## 2024-11-03 DIAGNOSIS — M25612 Stiffness of left shoulder, not elsewhere classified: Secondary | ICD-10-CM | POA: Diagnosis not present

## 2024-11-03 DIAGNOSIS — C50412 Malignant neoplasm of upper-outer quadrant of left female breast: Secondary | ICD-10-CM | POA: Diagnosis not present

## 2024-11-03 DIAGNOSIS — R293 Abnormal posture: Secondary | ICD-10-CM

## 2024-11-03 DIAGNOSIS — Z9189 Other specified personal risk factors, not elsewhere classified: Secondary | ICD-10-CM

## 2024-11-03 DIAGNOSIS — L7682 Other postprocedural complications of skin and subcutaneous tissue: Secondary | ICD-10-CM

## 2024-11-03 LAB — RAD ONC ARIA SESSION SUMMARY
Course Elapsed Days: 8
Plan Fractions Treated to Date: 7
Plan Fractions Treated to Date: 7
Plan Prescribed Dose Per Fraction: 2 Gy
Plan Prescribed Dose Per Fraction: 2 Gy
Plan Total Fractions Prescribed: 25
Plan Total Fractions Prescribed: 25
Plan Total Prescribed Dose: 50 Gy
Plan Total Prescribed Dose: 50 Gy
Reference Point Dosage Given to Date: 14 Gy
Reference Point Dosage Given to Date: 14 Gy
Reference Point Session Dosage Given: 2 Gy
Reference Point Session Dosage Given: 2 Gy
Session Number: 7

## 2024-11-03 NOTE — Therapy (Signed)
 OUTPATIENT PHYSICAL THERAPY  UPPER EXTREMITY ONCOLOGY POST OP VISIT  Patient Name: Wanda Garcia MRN: [id number] DOB:31-Aug-1976, 48 y.o., female Today's Date: 11/03/2024  END OF SESSION:  PT End of Session - 11/03/24 0815     Visit Number 7    Number of Visits 14    Date for Recertification  11/23/24    Authorization Type [employer]    Authorization Time Period 11/26-2/24/2025    Authorization - Visit Number 7    Authorization - Number of Visits 15    PT Start Time 0815    PT Stop Time 0856    PT Time Calculation (min) 41 min    Activity Tolerance Patient tolerated treatment well    Behavior During Therapy [facility] for tasks assessed/performed          Past Medical History:  Diagnosis Date   Arthritis    Breast cancer (HCC) 03/2024   left   Depression    situational   Headache    Neuropathy 07/20/2024   in fingers and toes   Past Surgical History:  Procedure Laterality Date   AXILLARY LYMPH NODE DISSECTION Left 09/14/2024   Procedure: LYMPHADENECTOMY, AXILLARY;  Surgeon: Aron Shoulders, MD;  Location: [facility];  Service: General;  Laterality: Left;  LEFT AXILLARY LYMPH NODE DISSECTION GEN w/PEC BLOCK   AXILLARY SENTINEL NODE BIOPSY Left 08/23/2024   Procedure: LEFT SENTINEL NODE BIOPSY AXILLARY;  Surgeon: Aron Shoulders, MD;  Location: [facility];  Service: General;  Laterality: Left;  LEFT SENTINEL NODE BIOPSY   BREAST BIOPSY Left 03/2024   CESAREAN SECTION N/A 04/06/2017   Procedure: CESAREAN SECTION;  Surgeon: Herchel Gloris LABOR, MD;  Location: [facility];  Service: Obstetrics;  Laterality: N/A;   CHOLECYSTECTOMY N/A 04/26/2019   Procedure: LAPAROSCOPIC CHOLECYSTECTOMY WITH POSSIBLE INTRAOPERATIVE CHOLANGIOGRAM;  Surgeon: Vernetta Berg, MD;  Location: [facility];  Service: General;  Laterality: N/A;   CYST EXCISION Right 2024   chest   IRRIGATION AND DEBRIDEMENT HEMATOMA Left 09/28/2024   Procedure: IRRIGATION AND DEBRIDEMENT HEMATOMA;  Surgeon: Aron Shoulders, MD;   Location: [facility];  Service: General;  Laterality: Left;  washout left axilla   PORT-A-CATH REMOVAL N/A 08/23/2024   Procedure: REMOVAL PORT-A-CATH;  Surgeon: Aron Shoulders, MD;  Location: [facility];  Service: General;  Laterality: N/A;   PORTACATH PLACEMENT N/A 04/04/2024   Procedure: INSERTION, TUNNELED CENTRAL VENOUS DEVICE, WITH PORT;  Surgeon: Aron Shoulders, MD;  Location: [facility];  Service: General;  Laterality: N/A;   Patient Active Problem List   Diagnosis Date Noted   Sepsis (HCC) 09/24/2024   Port-A-Cath in place 04/06/2024   Malignant neoplasm of upper-outer quadrant of left breast in female, estrogen receptor positive (HCC) 03/28/2024   Neck pain 07/09/2018   Trigger finger of left thumb 07/09/2018   Pain in left wrist 07/09/2018   Pain in right wrist 07/09/2018   S/P cesarean section 04/05/2017   Advanced maternal age, primigravida in first trimester, antepartum     PCP:   REFERRING PROVIDER: Mackey Chad, MD  REFERRING DIAG: Left breast Cancer, at risk for lymphedema  THERAPY DIAG:  Stiffness of left shoulder, not elsewhere classified  Axillary web syndrome  At risk for lymphedema  Abnormal posture  Malignant neoplasm of upper-outer quadrant of left breast in female, estrogen receptor positive (HCC)  ONSET DATE: 08/23/2024  Rationale for Evaluation and Treatment: Rehabilitation  SUBJECTIVE:  SUBJECTIVE STATEMENT:   I feel really good after I leave here. My husband ordered the sleeve for me .  PERTINENT HISTORY:  Pt is s/p Neoadjuvant chemotherapy and Left lumpectomy with surgery performed on 08/23/2024 for 2 primary tumors. She had 5+/5 LN's removed at that time. She had an ALND on 09/14/2024 with 3+/17 LN's for a total of 8+/22 LN's. She was hospitalized from 09/24/2024-10/04/2024  for sepsis/cellulitis at the surgical site. She had her drain removed yesterday. 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 PAIN:  Are you having pain? Yes NPRS scale: 3/10 left lateral arm Pain location: L chest Pain orientation: Left  PAIN TYPE: tight Pain description: intermittent  Aggravating factors: walking, reaching Relieving factors: resting  PRECAUTIONS: Right UE Lymphedema risk due to 8+/22 LN's removed, neuropathy  RED FLAGS: None   WEIGHT BEARING RESTRICTIONS: No  FALLS:  Has patient fallen in last 6 months? No  LEISURE: cares of son who is autistic  HAND DOMINANCE: right   PRIOR LEVEL OF FUNCTION: Independent  PATIENT GOALS: return to PLOF   OBJECTIVE: Note: Objective measures were completed at Evaluation unless otherwise noted.  COGNITION: Overall cognitive status: Within functional limits for tasks assessed   PALPATION: Very tender/sensitive Left UE    OBSERVATIONS / OTHER ASSESSMENTS: large breast incision healed, axillary incision covered with bandage;just had drain removed yesterday. When bandage removed and reapplied this am very little on bandage. Palpable cording left  axilla into forearm and pt very sensitive to light touch. No signs of UE lymphedema today  SENSATION: Light touch: Deficits     POSTURE: forward head, rounded shoulders   UPPER EXTREMITY STRENGTH:   UPPER EXTREMITY AROM/PROM:   A/PROM RIGHT   eval    Shoulder extension 47  Shoulder flexion 149  Shoulder abduction 154  Shoulder internal rotation 55  Shoulder external rotation 80                          (Blank rows = not tested)   A/PROM LEFT   eval LEFT 10/12/2024 LEFT 10/18/2024 LEFT 11/03/2024  Shoulder extension 40 32    Shoulder flexion 147 54, P! 130 P! 147  Shoulder abduction 167 40, P! 103 P! 140  Shoulder internal rotation 56     Shoulder external rotation 84                             (Blank rows = not tested)   CERVICAL AROM: All within normal limits      UPPER EXTREMITY STRENGTH: WNL   LYMPHEDEMA ASSESSMENTS (in cm):    LANDMARK RIGHT   eval RIGHT 10/12/2024  10 cm proximal to olecranon process 30.2 30.2  Olecranon process 24 24.5  10 cm proximal to ulnar styloid process 21.5 21.1  Just proximal to ulnar styloid process 14.2 14.7  Across hand at thumb web space 17.3 17.7  At base of 2nd digit 5.9 6.2  (Blank rows = not tested)   LANDMARK LEFT   eval LEFT 10/12/2024   10 cm proximal to olecranon process 28.8 29.96  Olecranon process 22.9 22.9  10 cm proximal to ulnar styloid process 20.8 21  Just proximal to ulnar styloid process 14.1 14.5  Across hand at thumb web space 17.5 17.7  At base of 2nd digit 6 6.2  (Blank rows = not tested)    QUICK DASH SURVEY: 66%  TREATMENT DATE:   11/03/2024 Pt came at wrong time. Present 8;00 pt did not come so pt was brought back at 8;15 Supine supine wand flexion and scaption x 5 Snow angels x 5 Supine on half roll;alternate shoulder alternate flexion x 5 ea, bilateral flex x 5, scaption x 5, horizontal abd x 5,  Lower trunk rotation x 3 ea Single arm doorway stretch x 3 20 seconds Gentle MFR to cording in left UE. PROM left shoulder flex, scaption, abd, ER Discussed compression sleeve if she receives. Showed where it should fit on her arm, and advised only during daytime, and remove if uncomfortable. Bring with her next time.  11/01/2024 Discussed progress with interpreter Hanane present Pulleys flexion and abduction x 2 min ea Supine on half roll;alternate shoulder flex x 5, scaption x 5, horizontal abd x 5, snow angels x 5 LTR with arms outstretched x 5 ea Corner wall stretch 3 x 20 sec Gentle MFR to cording in left UE. PROM left shoulder flex, scaption, abd, ER 10/27/2024 Discussed progress Pulleys flexion and abduction x 2 min ea Supine wand flex and  scaption x 5 , 5 sec hold Gentle MFR to cording in left UE. PROM left shoulder flex, scaption, abd, ER 3 D AROM bilaterally flexion, scaption, horizontal abd x 5, snow angels x 5 LTR Bilaterally with arms outstretched x 5 ea Corner pectoral stretch B x 3 B Updated HEP 10/20/2024 Discussed compression garment with pt. And she would like to have one for lymphedema prevention. Discussed purchasing on compression guru, vs [employer] [facility] [employer]. Discussed wearing for repetitive activities and flying Pulleys x 2 min flexion and scaption with VC not to force Gentle MFR to cording in left UE. Supine  STM to left UT, pectorals and lateral trunk with cocoa butter PROM left shoulder flex, scaption, abd, ER 3 D AROM bilaterally flexion, scaption, horizontal abd x 5 Showed pt how to stretch cords at elbow and with wrist. Gave pt info to order on compression guru Juzo dynamic Size 1 Max sleeve with silicone border, and size 1 gauntlet or may go to [employer] [facility] to purchase. She will speak with her husband 10/18/24: Remeasured shoulder ROM Therapeutic Exercise: Pulleys x 2 min in direction of flexion and 2 min in direction of abduction with pt returning therapist demo with v/c for proper form and to keep from hiking L shoulder Ball up wall x 10 reps in to flexion and 5 reps in to L shoulder abduction (stopped at 5 reps due to pain) with pt returning therapist demo Manual Therapy: In supine: PROM to L shoulder in to flexion, abduction, IR and ER with performing gentle MFR to cording from axilla to forearm while educating pt about cording and importance of constant stretching In R side lying: STM to L upper traps and rhomboids using cocoa butter with numerous areas of muscle tightness palpable   10/12/2024 Pt was instructed in 4 post op exercises; supine flexion and stargazer and sitting or standing scapular retraction, and wall slide for abd. She performed each for 5 reps holding 3-5 seconds ea. Multiple VC's  given so pt doesn't push through pain. Advised pt she does not have lymphedema presently. Will need to discss more in next few visits.Discussed POC, LOS, treatment interventions. Pt notes her husband works so it is best for her to have am appts.   PATIENT EDUCATION:  Access Code: [email or web address] URL: [email or web address] Date: 10/27/2024 Prepared by: Grayce Sheldon  Exercises - Supine Shoulder Flexion Extension  AAROM with Dowel  - 2 x daily - 7 x weekly - 1 sets - 5 reps - 5 hold - Supine Lower Trunk Rotation  - 1 x daily - 7 x weekly - 1 sets - 5 reps - 10 hold - Corner Pec Major Stretch  - 2 x daily - 7 x weekly - 1 sets - 3 reps - 15 hold Education details: Pt was instructed in 4 post op exercises; supine flexion and stargazer and sitting or standing scapular retraction, and wall slide for abd. She performed each for 5 reps holding 3-5 seconds ea Person educated: Patient Education method: Explanation, Demonstration, and Handouts Education comprehension: verbalized understanding and returned demonstration  HOME EXERCISE PROGRAM: 4 post op exercises; flexion and stargazer in supine, scapular retraction sitting or standing, wall slides for abd in standing Supine wand, corner pec stretch, LTR ASSESSMENT:  CLINICAL IMPRESSION: Cording very tight in upper arm today,  with several thin but tight cords in the axilla and pt with noticeable tightness in left pectorals. ROM is improving nicely despite tight cords.  OBJECTIVE IMPAIRMENTS: decreased activity tolerance, decreased knowledge of condition, decreased ROM, decreased strength, impaired sensation, impaired UE functional use, postural dysfunction, and pain.   ACTIVITY LIMITATIONS: carrying, lifting, sleeping, bed mobility, dressing, reach over head, hygiene/grooming, and caring for others  PARTICIPATION LIMITATIONS: cleaning, laundry, community activity, and caring for her autistic child  PERSONAL FACTORS: 3+ comorbidities: left  breast cancer s/p neoadjuvant chemo therapy, several surgeries, sepsis, 22 LN's removed, pending radiation are also affecting patient's functional outcome.   REHAB POTENTIAL: Good  CLINICAL DECISION MAKING: Evolving/moderate complexity  EVALUATION COMPLEXITY: Moderate  GOALS: Goals reviewed with patient? Yes    LONG TERM GOALS: (STG=LTG)      Name Target Date Goal status  1 Pt will be able to verbalize understanding of pertinent lymphedema risk reduction practices relevant to her dx specifically related to skin care.  Baseline:  No knowledge 03/30/2024 Achieved at eval  2 Pt will be able to return demo and/or verbalize understanding of the post op HEP related to regaining shoulder ROM. Baseline:  No knowledge 03/30/2024 Achieved at eval  3 Pt will be able to verbalize understanding of the importance of viewing the post op After Breast CA Class video for further lymphedema risk reduction education and therapeutic exercise.  Baseline:  No knowledge 03/30/2024 Achieved at eval  4 Pt will demo she has regained full shoulder ROM and function post operatively compared to baselines.  Baseline: See objective measurements taken today. 11/24/2023 RE-EVAL    5   Pt will have decreased pain by 60% or greater for improved daily activities 11/24/2023 RE-EVAL    6    Pt will be educated in precautions/risk factors for lymphedema and importance of SOZO screens, compression sleeve as preventative 11/24/2023 RE-EVAL          PLAN:  PT FREQUENCY: 2x/week  PT DURATION: 6 weeks  PLANNED INTERVENTIONS: 97164- PT Re-evaluation, 97750- Physical Performance Testing, 97110-Therapeutic exercises, 97530- Therapeutic activity, 97112- Neuromuscular re-education, 97535- Self Care, [id number]- Manual therapy, [id number]- Gait training, [phone]- Orthotic Initial, [phone]- Orthotic/Prosthetic subsequent, and Patient/Family education  PLAN FOR NEXT SESSION: set up SOZO screen around Jan 7, Did she order sleeve?, (may not be  covered as preventative), Review HEP, manual techniques for cording, PROM, STM prn . Progress as tolerated. See hx  Grayce JINNY Sheldon, PT 11/03/2024, 8:57 AM

## 2024-11-04 ENCOUNTER — Ambulatory Visit

## 2024-11-04 ENCOUNTER — Other Ambulatory Visit: Payer: Self-pay

## 2024-11-04 DIAGNOSIS — C50412 Malignant neoplasm of upper-outer quadrant of left female breast: Secondary | ICD-10-CM | POA: Diagnosis not present

## 2024-11-04 LAB — RAD ONC ARIA SESSION SUMMARY
Course Elapsed Days: 9
Plan Fractions Treated to Date: 8
Plan Fractions Treated to Date: 8
Plan Prescribed Dose Per Fraction: 2 Gy
Plan Prescribed Dose Per Fraction: 2 Gy
Plan Total Fractions Prescribed: 25
Plan Total Fractions Prescribed: 25
Plan Total Prescribed Dose: 50 Gy
Plan Total Prescribed Dose: 50 Gy
Reference Point Dosage Given to Date: 16 Gy
Reference Point Dosage Given to Date: 16 Gy
Reference Point Session Dosage Given: 2 Gy
Reference Point Session Dosage Given: 2 Gy
Session Number: 8

## 2024-11-06 ENCOUNTER — Ambulatory Visit: Admission: RE | Admit: 2024-11-06 | Discharge: 2024-11-06 | Attending: Radiation Oncology

## 2024-11-06 ENCOUNTER — Ambulatory Visit

## 2024-11-06 ENCOUNTER — Other Ambulatory Visit: Payer: Self-pay

## 2024-11-06 DIAGNOSIS — C50412 Malignant neoplasm of upper-outer quadrant of left female breast: Secondary | ICD-10-CM | POA: Diagnosis not present

## 2024-11-06 LAB — RAD ONC ARIA SESSION SUMMARY
Course Elapsed Days: 11
Plan Fractions Treated to Date: 9
Plan Fractions Treated to Date: 9
Plan Prescribed Dose Per Fraction: 2 Gy
Plan Prescribed Dose Per Fraction: 2 Gy
Plan Total Fractions Prescribed: 25
Plan Total Fractions Prescribed: 25
Plan Total Prescribed Dose: 50 Gy
Plan Total Prescribed Dose: 50 Gy
Reference Point Dosage Given to Date: 18 Gy
Reference Point Dosage Given to Date: 18 Gy
Reference Point Session Dosage Given: 2 Gy
Reference Point Session Dosage Given: 2 Gy
Session Number: 9

## 2024-11-07 ENCOUNTER — Ambulatory Visit

## 2024-11-07 ENCOUNTER — Other Ambulatory Visit: Payer: Self-pay

## 2024-11-07 ENCOUNTER — Ambulatory Visit: Admission: RE | Admit: 2024-11-07 | Discharge: 2024-11-07 | Attending: Radiation Oncology

## 2024-11-07 DIAGNOSIS — C50412 Malignant neoplasm of upper-outer quadrant of left female breast: Secondary | ICD-10-CM | POA: Diagnosis not present

## 2024-11-07 LAB — RAD ONC ARIA SESSION SUMMARY
Course Elapsed Days: 12
Plan Fractions Treated to Date: 10
Plan Fractions Treated to Date: 10
Plan Prescribed Dose Per Fraction: 2 Gy
Plan Prescribed Dose Per Fraction: 2 Gy
Plan Total Fractions Prescribed: 25
Plan Total Fractions Prescribed: 25
Plan Total Prescribed Dose: 50 Gy
Plan Total Prescribed Dose: 50 Gy
Reference Point Dosage Given to Date: 20 Gy
Reference Point Dosage Given to Date: 20 Gy
Reference Point Session Dosage Given: 1 Gy
Reference Point Session Dosage Given: 1.86 Gy
Session Number: 10

## 2024-11-08 ENCOUNTER — Ambulatory Visit
Admission: RE | Admit: 2024-11-08 | Discharge: 2024-11-08 | Disposition: A | Discharge status: Home / Self Care | Source: Ambulatory Visit | Attending: Radiation Oncology | Admitting: Radiation Oncology

## 2024-11-08 ENCOUNTER — Inpatient Hospital Stay: Attending: Licensed Clinical Social Worker | Admitting: Licensed Clinical Social Worker

## 2024-11-08 ENCOUNTER — Other Ambulatory Visit: Payer: Self-pay

## 2024-11-08 ENCOUNTER — Ambulatory Visit

## 2024-11-08 DIAGNOSIS — R293 Abnormal posture: Secondary | ICD-10-CM

## 2024-11-08 DIAGNOSIS — C50412 Malignant neoplasm of upper-outer quadrant of left female breast: Secondary | ICD-10-CM | POA: Diagnosis not present

## 2024-11-08 DIAGNOSIS — M25612 Stiffness of left shoulder, not elsewhere classified: Secondary | ICD-10-CM

## 2024-11-08 DIAGNOSIS — L905 Scar conditions and fibrosis of skin: Secondary | ICD-10-CM

## 2024-11-08 DIAGNOSIS — Z170 Estrogen receptor positive status [ER+]: Secondary | ICD-10-CM

## 2024-11-08 DIAGNOSIS — Z9189 Other specified personal risk factors, not elsewhere classified: Secondary | ICD-10-CM

## 2024-11-08 LAB — RAD ONC ARIA SESSION SUMMARY
Course Elapsed Days: 13
Plan Fractions Treated to Date: 11
Plan Fractions Treated to Date: 11
Plan Prescribed Dose Per Fraction: 2 Gy
Plan Prescribed Dose Per Fraction: 2 Gy
Plan Total Fractions Prescribed: 25
Plan Total Fractions Prescribed: 25
Plan Total Prescribed Dose: 50 Gy
Plan Total Prescribed Dose: 50 Gy
Reference Point Dosage Given to Date: 22 Gy
Reference Point Dosage Given to Date: 22 Gy
Reference Point Session Dosage Given: 2 Gy
Reference Point Session Dosage Given: 2 Gy
Session Number: 11

## 2024-11-08 NOTE — Therapy (Signed)
 " OUTPATIENT PHYSICAL THERAPY  UPPER EXTREMITY ONCOLOGY TREATMENT  Patient Name: Wanda Garcia MRN: [id number] DOB:01/10/1976, 48 y.o., female Today's Date: 11/08/2024  END OF SESSION:  PT End of Session - 11/08/24 1210     Visit Number 8    Number of Visits 14    Date for Recertification  11/23/24    Authorization Type [employer]    Authorization Time Period 11/26-2/24/2025    Authorization - Visit Number 8    Authorization - Number of Visits 15    PT Start Time 1205    PT Stop Time 1300    PT Time Calculation (min) 55 min    Activity Tolerance Patient tolerated treatment well    Behavior During Therapy WFL for tasks assessed/performed          Past Medical History:  Diagnosis Date   Arthritis    Breast cancer (HCC) 03/2024   left   Depression    situational   Headache    Neuropathy 07/20/2024   in fingers and toes   Past Surgical History:  Procedure Laterality Date   AXILLARY LYMPH NODE DISSECTION Left 09/14/2024   Procedure: LYMPHADENECTOMY, AXILLARY;  Surgeon: Aron Shoulders, MD;  Location: [facility];  Service: General;  Laterality: Left;  LEFT AXILLARY LYMPH NODE DISSECTION GEN w/PEC BLOCK   AXILLARY SENTINEL NODE BIOPSY Left 08/23/2024   Procedure: LEFT SENTINEL NODE BIOPSY AXILLARY;  Surgeon: Aron Shoulders, MD;  Location: [facility];  Service: General;  Laterality: Left;  LEFT SENTINEL NODE BIOPSY   BREAST BIOPSY Left 03/2024   CESAREAN SECTION N/A 04/06/2017   Procedure: CESAREAN SECTION;  Surgeon: Herchel Gloris LABOR, MD;  Location: [facility];  Service: Obstetrics;  Laterality: N/A;   CHOLECYSTECTOMY N/A 04/26/2019   Procedure: LAPAROSCOPIC CHOLECYSTECTOMY WITH POSSIBLE INTRAOPERATIVE CHOLANGIOGRAM;  Surgeon: Vernetta Berg, MD;  Location: [facility];  Service: General;  Laterality: N/A;   CYST EXCISION Right 2024   chest   IRRIGATION AND DEBRIDEMENT HEMATOMA Left 09/28/2024   Procedure: IRRIGATION AND DEBRIDEMENT HEMATOMA;  Surgeon: Aron Shoulders, MD;   Location: [facility];  Service: General;  Laterality: Left;  washout left axilla   PORT-A-CATH REMOVAL N/A 08/23/2024   Procedure: REMOVAL PORT-A-CATH;  Surgeon: Aron Shoulders, MD;  Location: [facility];  Service: General;  Laterality: N/A;   PORTACATH PLACEMENT N/A 04/04/2024   Procedure: INSERTION, TUNNELED CENTRAL VENOUS DEVICE, WITH PORT;  Surgeon: Aron Shoulders, MD;  Location: [facility];  Service: General;  Laterality: N/A;   Patient Active Problem List   Diagnosis Date Noted   Sepsis (HCC) 09/24/2024   Port-A-Cath in place 04/06/2024   Malignant neoplasm of upper-outer quadrant of left breast in female, estrogen receptor positive (HCC) 03/28/2024   Neck pain 07/09/2018   Trigger finger of left thumb 07/09/2018   Pain in left wrist 07/09/2018   Pain in right wrist 07/09/2018   S/P cesarean section 04/05/2017   Advanced maternal age, primigravida in first trimester, antepartum     PCP:   REFERRING PROVIDER: Mackey Chad, MD  REFERRING DIAG: Left breast Cancer, at risk for lymphedema  THERAPY DIAG:  Stiffness of left shoulder, not elsewhere classified  Axillary web syndrome  At risk for lymphedema  Abnormal posture  Malignant neoplasm of upper-outer quadrant of left breast in female, estrogen receptor positive (HCC)  ONSET DATE: 08/23/2024  Rationale for Evaluation and Treatment: Rehabilitation  SUBJECTIVE:  SUBJECTIVE STATEMENT:  I can feel the cord to my wrist today.   PERTINENT HISTORY:  Pt is s/p Neoadjuvant chemotherapy and Left lumpectomy with surgery performed on 08/23/2024 for 2 primary tumors. She had 5+/5 LN's removed at that time. She had an ALND on 09/14/2024 with 3+/17 LN's for a total of 8+/22 LN's. She was hospitalized from 09/24/2024-10/04/2024 for sepsis/cellulitis at the surgical  site. She had her drain removed yesterday. 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 PAIN:  Are you having pain? Yes NPRS scale: 3/10 left lateral arm Pain location: L chest Pain orientation: Left  PAIN TYPE: tight Pain description: intermittent  Aggravating factors: walking, reaching Relieving factors: resting  PRECAUTIONS: Right UE Lymphedema risk due to 8+/22 LN's removed, neuropathy  RED FLAGS: None   WEIGHT BEARING RESTRICTIONS: No  FALLS:  Has patient fallen in last 6 months? No  LEISURE: cares of son who is autistic  HAND DOMINANCE: right   PRIOR LEVEL OF FUNCTION: Independent  PATIENT GOALS: return to PLOF   OBJECTIVE: Note: Objective measures were completed at Evaluation unless otherwise noted.  COGNITION: Overall cognitive status: Within functional limits for tasks assessed   PALPATION: Very tender/sensitive Left UE    OBSERVATIONS / OTHER ASSESSMENTS: large breast incision healed, axillary incision covered with bandage;just had drain removed yesterday. When bandage removed and reapplied this am very little on bandage. Palpable cording left  axilla into forearm and pt very sensitive to light touch. No signs of UE lymphedema today  SENSATION: Light touch: Deficits     POSTURE: forward head, rounded shoulders   UPPER EXTREMITY STRENGTH:   UPPER EXTREMITY AROM/PROM:   A/PROM RIGHT   eval    Shoulder extension 47  Shoulder flexion 149  Shoulder abduction 154  Shoulder internal rotation 55  Shoulder external rotation 80                          (Blank rows = not tested)   A/PROM LEFT   eval LEFT 10/12/2024 LEFT 10/18/2024 LEFT 11/03/2024  Shoulder extension 40 32    Shoulder flexion 147 54, P! 130 P! 147  Shoulder abduction 167 40, P! 103 P! 140  Shoulder internal rotation 56     Shoulder external rotation 84                             (Blank rows = not tested)   CERVICAL AROM: All within normal limits     UPPER EXTREMITY STRENGTH: WNL    LYMPHEDEMA ASSESSMENTS (in cm):    LANDMARK RIGHT   eval RIGHT 10/12/2024  10 cm proximal to olecranon process 30.2 30.2  Olecranon process 24 24.5  10 cm proximal to ulnar styloid process 21.5 21.1  Just proximal to ulnar styloid process 14.2 14.7  Across hand at thumb web space 17.3 17.7  At base of 2nd digit 5.9 6.2  (Blank rows = not tested)   LANDMARK LEFT   eval LEFT 10/12/2024   10 cm proximal to olecranon process 28.8 29.96  Olecranon process 22.9 22.9  10 cm proximal to ulnar styloid process 20.8 21  Just proximal to ulnar styloid process 14.1 14.5  Across hand at thumb web space 17.5 17.7  At base of 2nd digit 6 6.2  (Blank rows = not tested)    QUICK DASH SURVEY: 66%  TREATMENT DATE:  11/08/24: Therapeutic Exercises Pulleys into flex and abd x 2 min each Yellow ball roll up wall into flex x 10, then Lt abd x 5, very big stretch felt so stopped at 5 Therapeutic Activities  Supine over half foam roll for following: Bil horz abd x 10, very tight; then bil UE scaption in a V x 10; and then bil UE abd in a snow angel x 10, 5 sec holds LTR x 5 each, 5 sec holds Manual Therapy PROM left shoulder flex, abd and D2 with scapular depression by therapist gently as pt was tender at superior shoulder Gentle MFR to cording in left UE that was mildly palpable today, this done with arms in varying positions during P/ROM STM to scar tissue in Lt axilla; then with cocoa butter to Lt UT and medial scapular border where trigger point palpable and pt ttp Scap Mobs into Lt protraction, retraction, and depression in Rt S/L  11/03/2024 Pt came at wrong time. Present 8;00 pt did not come so pt was brought back at 8;15 Supine supine wand flexion and scaption x 5 Snow angels x 5 Supine on half roll;alternate shoulder alternate flexion x 5 ea, bilateral flex x  5, scaption x 5, horizontal abd x 5,  Lower trunk rotation x 3 ea Single arm doorway stretch x 3 20 seconds Gentle MFR to cording in left UE. PROM left shoulder flex, scaption, abd, ER Discussed compression sleeve if she receives. Showed where it should fit on her arm, and advised only during daytime, and remove if uncomfortable. Bring with her next time.  11/01/2024 Discussed progress with interpreter Hanane present Pulleys flexion and abduction x 2 min ea Supine on half roll;alternate shoulder flex x 5, scaption x 5, horizontal abd x 5, snow angels x 5 LTR with arms outstretched x 5 ea Corner wall stretch 3 x 20 sec Gentle MFR to cording in left UE. PROM left shoulder flex, scaption, abd, ER  10/27/2024 Discussed progress Pulleys flexion and abduction x 2 min ea Supine wand flex and scaption x 5 , 5 sec hold Gentle MFR to cording in left UE. PROM left shoulder flex, scaption, abd, ER 3 D AROM bilaterally flexion, scaption, horizontal abd x 5, snow angels x 5 LTR Bilaterally with arms outstretched x 5 ea Corner pectoral stretch B x 3 B Updated HEP     PATIENT EDUCATION:  Access Code: [email or web address] URL: [email or web address] Date: 10/27/2024 Prepared by: Grayce Sheldon  Exercises - Supine Shoulder Flexion Extension AAROM with Dowel  - 2 x daily - 7 x weekly - 1 sets - 5 reps - 5 hold - Supine Lower Trunk Rotation  - 1 x daily - 7 x weekly - 1 sets - 5 reps - 10 hold - Corner Pec Major Stretch  - 2 x daily - 7 x weekly - 1 sets - 3 reps - 15 hold Education details: Pt was instructed in 4 post op exercises; supine flexion and stargazer and sitting or standing scapular retraction, and wall slide for abd. She performed each for 5 reps holding 3-5 seconds ea Person educated: Patient Education method: Explanation, Demonstration, and Handouts Education comprehension: verbalized understanding and returned demonstration  HOME EXERCISE PROGRAM: 4 post op exercises; flexion  and stargazer in supine, scapular retraction sitting or standing, wall slides for abd in standing Supine wand, corner pec stretch, LTR ASSESSMENT:  CLINICAL IMPRESSION: Cording seemed mildly palpable today though pt does still report much tightness from deep cording. Continued with  A/AA/ROM  stretches working on end ROMs which she is very tight with. Good improvements were noted in motion though after stretching. And then continued with manual therapy working to decrease fascial restrictions in Lt upper quadrant that are limiting to her end motions. Also multiple trigger points palpable in Lt UE and at medial scap border.    OBJECTIVE IMPAIRMENTS: decreased activity tolerance, decreased knowledge of condition, decreased ROM, decreased strength, impaired sensation, impaired UE functional use, postural dysfunction, and pain.   ACTIVITY LIMITATIONS: carrying, lifting, sleeping, bed mobility, dressing, reach over head, hygiene/grooming, and caring for others  PARTICIPATION LIMITATIONS: cleaning, laundry, community activity, and caring for her autistic child  PERSONAL FACTORS: 3+ comorbidities: left breast cancer s/p neoadjuvant chemo therapy, several surgeries, sepsis, 22 LN's removed, pending radiation are also affecting patient's functional outcome.   REHAB POTENTIAL: Good  CLINICAL DECISION MAKING: Evolving/moderate complexity  EVALUATION COMPLEXITY: Moderate  GOALS: Goals reviewed with patient? Yes    LONG TERM GOALS: (STG=LTG)      Name Target Date Goal status  1 Pt will be able to verbalize understanding of pertinent lymphedema risk reduction practices relevant to her dx specifically related to skin care.  Baseline:  No knowledge 03/30/2024 Achieved at eval  2 Pt will be able to return demo and/or verbalize understanding of the post op HEP related to regaining shoulder ROM. Baseline:  No knowledge 03/30/2024 Achieved at eval  3 Pt will be able to verbalize understanding of the  importance of viewing the post op After Breast CA Class video for further lymphedema risk reduction education and therapeutic exercise.  Baseline:  No knowledge 03/30/2024 Achieved at eval  4 Pt will demo she has regained full shoulder ROM and function post operatively compared to baselines.  Baseline: See objective measurements taken today. 11/24/2023 RE-EVAL    5   Pt will have decreased pain by 60% or greater for improved daily activities 11/24/2023 RE-EVAL    6    Pt will be educated in precautions/risk factors for lymphedema and importance of SOZO screens, compression sleeve as preventative 11/24/2023 RE-EVAL          PLAN:  PT FREQUENCY: 2x/week  PT DURATION: 6 weeks  PLANNED INTERVENTIONS: 97164- PT Re-evaluation, 97750- Physical Performance Testing, 97110-Therapeutic exercises, 97530- Therapeutic activity, 97112- Neuromuscular re-education, 97535- Self Care, [id number]- Manual therapy, [id number]- Gait training, [id number]- Orthotic Initial, [phone]- Orthotic/Prosthetic subsequent, and Patient/Family education  PLAN FOR NEXT SESSION: set up SOZO screen around Jan 7, Did sleeve arrive yet?, Review HEP, manual techniques for cording, PROM, STM prn . Progress as tolerated. See hx  Aden Berwyn Caldron, PTA 11/08/2024, 1:20 PM  "

## 2024-11-08 NOTE — Progress Notes (Signed)
 [facility] CSW Progress Note  Clinical Child Psychotherapist received TC from pt's spouse regarding resources. Pt spouse, they have a qualifying bill for [employer]. CSW explained next steps since [employer] is closed until January but that CSW will submit application at that time once I receive the bill.  CSW also provided information on how to apply for assistance through [employer] L[employer] program for the coming year.     Follow Up Plan:  CSW will submit [employer] application on pt's behalf when it reopens in January    Effie Janoski E Taquila Leys, LCSW Clinical Social Worker [facility]    Patient is participating in a Managed Medicaid Plan:  Yes

## 2024-11-09 ENCOUNTER — Other Ambulatory Visit: Payer: Self-pay

## 2024-11-09 ENCOUNTER — Ambulatory Visit
Admission: RE | Admit: 2024-11-09 | Discharge: 2024-11-09 | Disposition: A | Source: Ambulatory Visit | Attending: Radiation Oncology

## 2024-11-09 ENCOUNTER — Ambulatory Visit

## 2024-11-09 DIAGNOSIS — C50412 Malignant neoplasm of upper-outer quadrant of left female breast: Secondary | ICD-10-CM | POA: Diagnosis not present

## 2024-11-09 LAB — RAD ONC ARIA SESSION SUMMARY
Course Elapsed Days: 14
Plan Fractions Treated to Date: 12
Plan Fractions Treated to Date: 12
Plan Prescribed Dose Per Fraction: 2 Gy
Plan Prescribed Dose Per Fraction: 2 Gy
Plan Total Fractions Prescribed: 25
Plan Total Fractions Prescribed: 25
Plan Total Prescribed Dose: 50 Gy
Plan Total Prescribed Dose: 50 Gy
Reference Point Dosage Given to Date: 24 Gy
Reference Point Dosage Given to Date: 24 Gy
Reference Point Session Dosage Given: 2 Gy
Reference Point Session Dosage Given: 2 Gy
Session Number: 12

## 2024-11-14 ENCOUNTER — Other Ambulatory Visit: Payer: Self-pay

## 2024-11-14 ENCOUNTER — Ambulatory Visit

## 2024-11-14 ENCOUNTER — Ambulatory Visit
Admission: RE | Admit: 2024-11-14 | Discharge: 2024-11-14 | Disposition: A | Discharge status: Home / Self Care | Source: Ambulatory Visit | Attending: Radiation Oncology | Admitting: Radiation Oncology

## 2024-11-14 DIAGNOSIS — R293 Abnormal posture: Secondary | ICD-10-CM

## 2024-11-14 DIAGNOSIS — M25612 Stiffness of left shoulder, not elsewhere classified: Secondary | ICD-10-CM

## 2024-11-14 DIAGNOSIS — Z9189 Other specified personal risk factors, not elsewhere classified: Secondary | ICD-10-CM

## 2024-11-14 DIAGNOSIS — L7682 Other postprocedural complications of skin and subcutaneous tissue: Secondary | ICD-10-CM

## 2024-11-14 DIAGNOSIS — C50412 Malignant neoplasm of upper-outer quadrant of left female breast: Secondary | ICD-10-CM | POA: Diagnosis not present

## 2024-11-14 DIAGNOSIS — Z170 Estrogen receptor positive status [ER+]: Secondary | ICD-10-CM

## 2024-11-14 LAB — RAD ONC ARIA SESSION SUMMARY
Course Elapsed Days: 19
Plan Fractions Treated to Date: 13
Plan Fractions Treated to Date: 13
Plan Prescribed Dose Per Fraction: 2 Gy
Plan Prescribed Dose Per Fraction: 2 Gy
Plan Total Fractions Prescribed: 25
Plan Total Fractions Prescribed: 25
Plan Total Prescribed Dose: 50 Gy
Plan Total Prescribed Dose: 50 Gy
Reference Point Dosage Given to Date: 26 Gy
Reference Point Dosage Given to Date: 26 Gy
Reference Point Session Dosage Given: 2 Gy
Reference Point Session Dosage Given: 2 Gy
Session Number: 13

## 2024-11-14 NOTE — Therapy (Signed)
 " OUTPATIENT PHYSICAL THERAPY  UPPER EXTREMITY ONCOLOGY TREATMENT  Patient Name: Wanda Garcia MRN: [id number] DOB:1975-11-28, 48 y.o., female Today's Date: 11/14/2024  END OF SESSION:  PT End of Session - 11/14/24 1001     Visit Number 9    Number of Visits 14    Date for Recertification  11/23/24    Authorization Type [employer]    Authorization Time Period 11/26-2/24/2025    Authorization - Visit Number 9    Authorization - Number of Visits 15    PT Start Time 1001    PT Stop Time 1045    PT Time Calculation (min) 44 min    Activity Tolerance Patient tolerated treatment well    Behavior During Therapy WFL for tasks assessed/performed          Past Medical History:  Diagnosis Date   Arthritis    Breast cancer (HCC) 03/2024   left   Depression    situational   Headache    Neuropathy 07/20/2024   in fingers and toes   Past Surgical History:  Procedure Laterality Date   AXILLARY LYMPH NODE DISSECTION Left 09/14/2024   Procedure: LYMPHADENECTOMY, AXILLARY;  Surgeon: Aron Shoulders, MD;  Location: [facility];  Service: General;  Laterality: Left;  LEFT AXILLARY LYMPH NODE DISSECTION GEN w/PEC BLOCK   AXILLARY SENTINEL NODE BIOPSY Left 08/23/2024   Procedure: LEFT SENTINEL NODE BIOPSY AXILLARY;  Surgeon: Aron Shoulders, MD;  Location: [facility];  Service: General;  Laterality: Left;  LEFT SENTINEL NODE BIOPSY   BREAST BIOPSY Left 03/2024   CESAREAN SECTION N/A 04/06/2017   Procedure: CESAREAN SECTION;  Surgeon: Herchel Gloris LABOR, MD;  Location: [facility];  Service: Obstetrics;  Laterality: N/A;   CHOLECYSTECTOMY N/A 04/26/2019   Procedure: LAPAROSCOPIC CHOLECYSTECTOMY WITH POSSIBLE INTRAOPERATIVE CHOLANGIOGRAM;  Surgeon: Vernetta Berg, MD;  Location: [facility];  Service: General;  Laterality: N/A;   CYST EXCISION Right 2024   chest   IRRIGATION AND DEBRIDEMENT HEMATOMA Left 09/28/2024   Procedure: IRRIGATION AND DEBRIDEMENT HEMATOMA;  Surgeon: Aron Shoulders, MD;   Location: [facility];  Service: General;  Laterality: Left;  washout left axilla   PORT-A-CATH REMOVAL N/A 08/23/2024   Procedure: REMOVAL PORT-A-CATH;  Surgeon: Aron Shoulders, MD;  Location: [facility];  Service: General;  Laterality: N/A;   PORTACATH PLACEMENT N/A 04/04/2024   Procedure: INSERTION, TUNNELED CENTRAL VENOUS DEVICE, WITH PORT;  Surgeon: Aron Shoulders, MD;  Location: [facility];  Service: General;  Laterality: N/A;   Patient Active Problem List   Diagnosis Date Noted   Sepsis (HCC) 09/24/2024   Port-A-Cath in place 04/06/2024   Malignant neoplasm of upper-outer quadrant of left breast in female, estrogen receptor positive (HCC) 03/28/2024   Neck pain 07/09/2018   Trigger finger of left thumb 07/09/2018   Pain in left wrist 07/09/2018   Pain in right wrist 07/09/2018   S/P cesarean section 04/05/2017   Advanced maternal age, primigravida in first trimester, antepartum     PCP:   REFERRING PROVIDER: Mackey Chad, MD  REFERRING DIAG: Left breast Cancer, at risk for lymphedema  THERAPY DIAG:  Stiffness of left shoulder, not elsewhere classified  Axillary web syndrome  At risk for lymphedema  Abnormal posture  Malignant neoplasm of upper-outer quadrant of left breast in female, estrogen receptor positive (HCC)  ONSET DATE: 08/23/2024  Rationale for Evaluation and Treatment: Rehabilitation  SUBJECTIVE:  SUBJECTIVE STATEMENT:  in the axilla and upper Had radiation this am already.  Her skin is doing OK. I feel like something heavy in the arm pit. My whole body feels heavy and its hard to get up from the floor. I am not sleeping at all.Left shoulder is doing better. I felt a burning last week in the axilla and medial upper arm for a few days. Interpreter: Feryal (CAP)  PERTINENT HISTORY:  Pt is  s/p Neoadjuvant chemotherapy and Left lumpectomy with surgery performed on 08/23/2024 for 2 primary tumors. She had 5+/5 LN's removed at that time. She had an ALND on 09/14/2024 with 3+/17 LN's for a total of 8+/22 LN's. She was hospitalized from 09/24/2024-10/04/2024 for sepsis/cellulitis at the surgical site. She had her drain removed yesterday. 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 PAIN:  Are you having pain? Yes NPRS scale: 3/10 left lateral arm/armpit. Pain location: L chest Pain orientation: Left  PAIN TYPE: tight Pain description: intermittent  Aggravating factors: walking, reaching Relieving factors: resting  PRECAUTIONS: Right UE Lymphedema risk due to 8+/22 LN's removed, neuropathy  RED FLAGS: None   WEIGHT BEARING RESTRICTIONS: No  FALLS:  Has patient fallen in last 6 months? No  LEISURE: cares of son who is autistic  HAND DOMINANCE: right   PRIOR LEVEL OF FUNCTION: Independent  PATIENT GOALS: return to PLOF   OBJECTIVE: Note: Objective measures were completed at Evaluation unless otherwise noted.  COGNITION: Overall cognitive status: Within functional limits for tasks assessed   PALPATION: Very tender/sensitive Left UE    OBSERVATIONS / OTHER ASSESSMENTS: large breast incision healed, axillary incision covered with bandage;just had drain removed yesterday. When bandage removed and reapplied this am very little on bandage. Palpable cording left  axilla into forearm and pt very sensitive to light touch. No signs of UE lymphedema today  SENSATION: Light touch: Deficits     POSTURE: forward head, rounded shoulders   UPPER EXTREMITY STRENGTH:   UPPER EXTREMITY AROM/PROM:   A/PROM RIGHT   eval    Shoulder extension 47  Shoulder flexion 149  Shoulder abduction 154  Shoulder internal rotation 55  Shoulder external rotation 80                          (Blank rows = not tested)   A/PROM LEFT   eval LEFT 10/12/2024 LEFT 10/18/2024 LEFT 11/03/2024   Shoulder extension 40 32    Shoulder flexion 147 54, P! 130 P! 147  Shoulder abduction 167 40, P! 103 P! 140  Shoulder internal rotation 56     Shoulder external rotation 84                             (Blank rows = not tested)   CERVICAL AROM: All within normal limits     UPPER EXTREMITY STRENGTH: WNL   LYMPHEDEMA ASSESSMENTS (in cm):    LANDMARK RIGHT   eval RIGHT 10/12/2024  10 cm proximal to olecranon process 30.2 30.2  Olecranon process 24 24.5  10 cm proximal to ulnar styloid process 21.5 21.1  Just proximal to ulnar styloid process 14.2 14.7  Across hand at thumb web space 17.3 17.7  At base of 2nd digit 5.9 6.2  (Blank rows = not tested)   LANDMARK LEFT   eval LEFT 10/12/2024   10 cm proximal to olecranon process 28.8 29.96  Olecranon process 22.9 22.9  10 cm proximal to ulnar styloid process 20.8 21  Just proximal to ulnar styloid process 14.1 14.5  Across hand at thumb web space 17.5 17.7  At base of 2nd digit 6 6.2  (Blank rows = not tested)    QUICK DASH SURVEY: 66%                                                                                                                            TREATMENT DATE:  11/14/2024 Pulleys flexion and abduction x 2 min ea Supine wand scaption x 5 Snow angels x 5, 10 sec hold Alternate shoulder flexion x 6 ea, horizontal abd x 5 ea Wall arc to stretch lateral trunk x 3 MFR to left UE cording, wrist extension in D2 flexion to stretch cords. STM to left pectorals, axillary border, left scapular region with cocoa butter PROM left shoulder flexion, scaption, abduction, ER Pt requested to leave early at 10;45 11/08/24: Therapeutic Exercises Pulleys into flex and abd x 2 min each Yellow ball roll up wall into flex x 10, then Lt abd x 5, very big stretch felt so stopped at 5 Therapeutic Activities  Supine over half foam roll for following: Bil horz abd x 10, very tight; then bil UE scaption in a V x 10; and then bil  UE abd in a snow angel x 10, 5 sec holds LTR x 5 each, 5 sec holds Manual Therapy PROM left shoulder flex, abd and D2 with scapular depression by therapist gently as pt was tender at superior shoulder Gentle MFR to cording in left UE that was mildly palpable today, this done with arms in varying positions during P/ROM STM to scar tissue in Lt axilla; then with cocoa butter to Lt UT and medial scapular border where trigger point palpable and pt ttp Scap Mobs into Lt protraction, retraction, and depression in Rt S/L  11/03/2024 Pt came at wrong time. Present 8;00 pt did not come so pt was brought back at 8;15 Supine supine wand flexion and scaption x 5 Snow angels x 5 Supine on half roll;alternate shoulder alternate flexion x 5 ea, bilateral flex x 5, scaption x 5, horizontal abd x 5,  Lower trunk rotation x 3 ea Single arm doorway stretch x 3 20 seconds Gentle MFR to cording in left UE. PROM left shoulder flex, scaption, abd, ER Discussed compression sleeve if she receives. Showed where it should fit on her arm, and advised only during daytime, and remove if uncomfortable. Bring with her next time.  11/01/2024 Discussed progress with interpreter Hanane present Pulleys flexion and abduction x 2 min ea Supine on half roll;alternate shoulder flex x 5, scaption x 5, horizontal abd x 5, snow angels x 5 LTR with arms outstretched x 5 ea Corner wall stretch 3 x 20 sec Gentle MFR to cording in left UE. PROM left shoulder flex, scaption, abd, ER  10/27/2024 Discussed progress Pulleys flexion and abduction x 2 min ea Supine  wand flex and scaption x 5 , 5 sec hold Gentle MFR to cording in left UE. PROM left shoulder flex, scaption, abd, ER 3 D AROM bilaterally flexion, scaption, horizontal abd x 5, snow angels x 5 LTR Bilaterally with arms outstretched x 5 ea Corner pectoral stretch B x 3 B Updated HEP     PATIENT EDUCATION:  Access Code: [email or web address] URL:  [email or web address] Date: 10/27/2024 Prepared by: Grayce Sheldon  Exercises - Supine Shoulder Flexion Extension AAROM with Dowel  - 2 x daily - 7 x weekly - 1 sets - 5 reps - 5 hold - Supine Lower Trunk Rotation  - 1 x daily - 7 x weekly - 1 sets - 5 reps - 10 hold - Corner Pec Major Stretch  - 2 x daily - 7 x weekly - 1 sets - 3 reps - 15 hold Education details: Pt was instructed in 4 post op exercises; supine flexion and stargazer and sitting or standing scapular retraction, and wall slide for abd. She performed each for 5 reps holding 3-5 seconds ea Person educated: Patient Education method: Explanation, Demonstration, and Handouts Education comprehension: verbalized understanding and returned demonstration  HOME EXERCISE PROGRAM: 4 post op exercises; flexion and stargazer in supine, scapular retraction sitting or standing, wall slides for abd in standing Supine wand, corner pec stretch, LTR ASSESSMENT:  CLINICAL IMPRESSION:  Pt has to leave early for today and next visit. She is still feeling cording in the axillary region and with cording release techniques. She continues with tightness in the pectoral region, and  in lateral trunk. Trigger point in scapular region remains but is improved. She requires VC's to depress scapula to prevent shoulder pain. She has not yet received her sleeve.  OBJECTIVE IMPAIRMENTS: decreased activity tolerance, decreased knowledge of condition, decreased ROM, decreased strength, impaired sensation, impaired UE functional use, postural dysfunction, and pain.   ACTIVITY LIMITATIONS: carrying, lifting, sleeping, bed mobility, dressing, reach over head, hygiene/grooming, and caring for others  PARTICIPATION LIMITATIONS: cleaning, laundry, community activity, and caring for her autistic child  PERSONAL FACTORS: 3+ comorbidities: left breast cancer s/p neoadjuvant chemo therapy, several surgeries, sepsis, 22 LN's removed, pending radiation are  also affecting patient's functional outcome.   REHAB POTENTIAL: Good  CLINICAL DECISION MAKING: Evolving/moderate complexity  EVALUATION COMPLEXITY: Moderate  GOALS: Goals reviewed with patient? Yes    LONG TERM GOALS: (STG=LTG)      Name Target Date Goal status  1 Pt will be able to verbalize understanding of pertinent lymphedema risk reduction practices relevant to her dx specifically related to skin care.  Baseline:  No knowledge 03/30/2024 Achieved at eval  2 Pt will be able to return demo and/or verbalize understanding of the post op HEP related to regaining shoulder ROM. Baseline:  No knowledge 03/30/2024 Achieved at eval  3 Pt will be able to verbalize understanding of the importance of viewing the post op After Breast CA Class video for further lymphedema risk reduction education and therapeutic exercise.  Baseline:  No knowledge 03/30/2024 Achieved at eval  4 Pt will demo she has regained full shoulder ROM and function post operatively compared to baselines.  Baseline: See objective measurements taken today. 11/24/2023 RE-EVAL    5   Pt will have decreased pain by 60% or greater for improved daily activities 11/24/2023 RE-EVAL    6    Pt will be educated in precautions/risk factors for lymphedema and importance of SOZO screens, compression sleeve as preventative 11/24/2023 RE-EVAL  PLAN:  PT FREQUENCY: 2x/week  PT DURATION: 6 weeks  PLANNED INTERVENTIONS: [id number]- PT Re-evaluation, 97750- Physical Performance Testing, 97110-Therapeutic exercises, 97530- Therapeutic activity, [id number]- Neuromuscular re-education, 97535- Self Care, [id number]- Manual therapy, [id number]- Gait training, [phone]- Orthotic Initial, [phone]- Orthotic/Prosthetic subsequent, and Patient/Family education  PLAN FOR NEXT SESSION: set up SOZO screen around Jan 7, Did sleeve arrive yet?, Review HEP, manual techniques for cording, PROM, STM prn . Progress as tolerated. See hx  Grayce JINNY Sheldon, PT 11/14/2024,  10:52 AM  "

## 2024-11-15 ENCOUNTER — Other Ambulatory Visit: Payer: Self-pay

## 2024-11-15 ENCOUNTER — Ambulatory Visit
Admission: RE | Admit: 2024-11-15 | Discharge: 2024-11-15 | Disposition: A | Discharge status: Home / Self Care | Source: Ambulatory Visit | Attending: Radiation Oncology | Admitting: Radiation Oncology

## 2024-11-15 DIAGNOSIS — C50412 Malignant neoplasm of upper-outer quadrant of left female breast: Secondary | ICD-10-CM | POA: Diagnosis not present

## 2024-11-15 LAB — RAD ONC ARIA SESSION SUMMARY
Course Elapsed Days: 20
Plan Fractions Treated to Date: 14
Plan Fractions Treated to Date: 14
Plan Prescribed Dose Per Fraction: 2 Gy
Plan Prescribed Dose Per Fraction: 2 Gy
Plan Total Fractions Prescribed: 25
Plan Total Fractions Prescribed: 25
Plan Total Prescribed Dose: 50 Gy
Plan Total Prescribed Dose: 50 Gy
Reference Point Dosage Given to Date: 28 Gy
Reference Point Dosage Given to Date: 28 Gy
Reference Point Session Dosage Given: 2 Gy
Reference Point Session Dosage Given: 2 Gy
Session Number: 14

## 2024-11-16 ENCOUNTER — Ambulatory Visit

## 2024-11-16 ENCOUNTER — Other Ambulatory Visit: Payer: Self-pay

## 2024-11-16 ENCOUNTER — Ambulatory Visit
Admission: RE | Admit: 2024-11-16 | Discharge: 2024-11-16 | Disposition: A | Discharge status: Home / Self Care | Source: Ambulatory Visit | Attending: Radiation Oncology | Admitting: Radiation Oncology

## 2024-11-16 DIAGNOSIS — Z9189 Other specified personal risk factors, not elsewhere classified: Secondary | ICD-10-CM

## 2024-11-16 DIAGNOSIS — R293 Abnormal posture: Secondary | ICD-10-CM

## 2024-11-16 DIAGNOSIS — L7682 Other postprocedural complications of skin and subcutaneous tissue: Secondary | ICD-10-CM

## 2024-11-16 DIAGNOSIS — M25612 Stiffness of left shoulder, not elsewhere classified: Secondary | ICD-10-CM

## 2024-11-16 DIAGNOSIS — C50412 Malignant neoplasm of upper-outer quadrant of left female breast: Secondary | ICD-10-CM | POA: Diagnosis not present

## 2024-11-16 LAB — RAD ONC ARIA SESSION SUMMARY
Course Elapsed Days: 21
Plan Fractions Treated to Date: 15
Plan Fractions Treated to Date: 15
Plan Prescribed Dose Per Fraction: 2 Gy
Plan Prescribed Dose Per Fraction: 2 Gy
Plan Total Fractions Prescribed: 25
Plan Total Fractions Prescribed: 25
Plan Total Prescribed Dose: 50 Gy
Plan Total Prescribed Dose: 50 Gy
Reference Point Dosage Given to Date: 30 Gy
Reference Point Dosage Given to Date: 30 Gy
Reference Point Session Dosage Given: 2 Gy
Reference Point Session Dosage Given: 2 Gy
Session Number: 15

## 2024-11-16 NOTE — Therapy (Signed)
 " OUTPATIENT PHYSICAL THERAPY  UPPER EXTREMITY ONCOLOGY TREATMENT  Patient Name: Wanda Garcia MRN: [id number] DOB:[birth year], 48 y.o., female Today's Date: 11/16/2024  END OF SESSION:  PT End of Session - 11/16/24 1047     Visit Number 10    Number of Visits 14    Date for Recertification  11/23/24    Authorization Type [employer]    Authorization Time Period 11/26-2/24/2025    Authorization - Visit Number 10    Authorization - Number of Visits 15    PT Start Time 1014   pt arrived late due to transportation got lost   PT Stop Time 1045   had to leave early due to transportation   PT Time Calculation (min) 31 min    Activity Tolerance Patient tolerated treatment well    Behavior During Therapy [facility] for tasks assessed/performed          Past Medical History:  Diagnosis Date   Arthritis    Breast cancer (HCC) 03/2024   left   Depression    situational   Headache    Neuropathy 07/20/2024   in fingers and toes   Past Surgical History:  Procedure Laterality Date   AXILLARY LYMPH NODE DISSECTION Left 09/14/2024   Procedure: LYMPHADENECTOMY, AXILLARY;  Surgeon: Aron Shoulders, MD;  Location: [facility] OR;  Service: General;  Laterality: Left;  LEFT AXILLARY LYMPH NODE DISSECTION GEN w/PEC BLOCK   AXILLARY SENTINEL NODE BIOPSY Left 08/23/2024   Procedure: LEFT SENTINEL NODE BIOPSY AXILLARY;  Surgeon: Aron Shoulders, MD;  Location: [facility];  Service: General;  Laterality: Left;  LEFT SENTINEL NODE BIOPSY   BREAST BIOPSY Left 03/2024   CESAREAN SECTION N/A 04/06/2017   Procedure: CESAREAN SECTION;  Surgeon: Herchel Gloris LABOR, MD;  Location: [facility];  Service: Obstetrics;  Laterality: N/A;   CHOLECYSTECTOMY N/A 04/26/2019   Procedure: LAPAROSCOPIC CHOLECYSTECTOMY WITH POSSIBLE INTRAOPERATIVE CHOLANGIOGRAM;  Surgeon: Vernetta Berg, MD;  Location: [facility];  Service: General;  Laterality: N/A;   CYST EXCISION Right 2024   chest   IRRIGATION AND DEBRIDEMENT HEMATOMA Left  09/28/2024   Procedure: IRRIGATION AND DEBRIDEMENT HEMATOMA;  Surgeon: Aron Shoulders, MD;  Location: [facility];  Service: General;  Laterality: Left;  washout left axilla   PORT-A-CATH REMOVAL N/A 08/23/2024   Procedure: REMOVAL PORT-A-CATH;  Surgeon: Aron Shoulders, MD;  Location: [facility];  Service: General;  Laterality: N/A;   PORTACATH PLACEMENT N/A 04/04/2024   Procedure: INSERTION, TUNNELED CENTRAL VENOUS DEVICE, WITH PORT;  Surgeon: Aron Shoulders, MD;  Location: [facility];  Service: General;  Laterality: N/A;   Patient Active Problem List   Diagnosis Date Noted   Sepsis (HCC) 09/24/2024   Port-A-Cath in place 04/06/2024   Malignant neoplasm of upper-outer quadrant of left breast in female, estrogen receptor positive (HCC) 03/28/2024   Neck pain 07/09/2018   Trigger finger of left thumb 07/09/2018   Pain in left wrist 07/09/2018   Pain in right wrist 07/09/2018   S/P cesarean section 04/05/2017   Advanced maternal age, primigravida in first trimester, antepartum     PCP:   REFERRING PROVIDER: Mackey Chad, MD  REFERRING DIAG: Left breast Cancer, at risk for lymphedema  THERAPY DIAG:  Stiffness of left shoulder, not elsewhere classified  Axillary web syndrome  At risk for lymphedema  Abnormal posture  Malignant neoplasm of upper-outer quadrant of left breast in female, estrogen receptor positive (HCC)  ONSET DATE: 08/23/2024  Rationale for Evaluation and Treatment: Rehabilitation  SUBJECTIVE:  SUBJECTIVE STATEMENT:   I still have burning from my axilla into upper arm but that comes and goes. I have been so tired with radiation and my son (who has autism) had a rough few days so I have really been exhausted lately. My neighbor brought me today but we got lost coming here.   PERTINENT HISTORY:  Pt  is s/p Neoadjuvant chemotherapy and Left lumpectomy with surgery performed on 08/23/2024 for 2 primary tumors. She had 5+/5 LN's removed at that time. She had an ALND on 09/14/2024 with 3+/17 LN's for a total of 8+/22 LN's. She was hospitalized from 09/24/2024-10/04/2024 for sepsis/cellulitis at the surgical site. She had her drain removed yesterday. 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 PAIN:  Are you having pain? No, just sore in Lt axilla  PRECAUTIONS: Right UE Lymphedema risk due to 8+/22 LN's removed, neuropathy  RED FLAGS: None   WEIGHT BEARING RESTRICTIONS: No  FALLS:  Has patient fallen in last 6 months? No  LEISURE: cares of son who is autistic  HAND DOMINANCE: right   PRIOR LEVEL OF FUNCTION: Independent  PATIENT GOALS: return to PLOF   OBJECTIVE: Note: Objective measures were completed at Evaluation unless otherwise noted.  COGNITION: Overall cognitive status: Within functional limits for tasks assessed   PALPATION: Very tender/sensitive Left UE    OBSERVATIONS / OTHER ASSESSMENTS: large breast incision healed, axillary incision covered with bandage;just had drain removed yesterday. When bandage removed and reapplied this am very little on bandage. Palpable cording left  axilla into forearm and pt very sensitive to light touch. No signs of UE lymphedema today  SENSATION: Light touch: Deficits     POSTURE: forward head, rounded shoulders   UPPER EXTREMITY STRENGTH:   UPPER EXTREMITY AROM/PROM:   A/PROM RIGHT   eval    Shoulder extension 47  Shoulder flexion 149  Shoulder abduction 154  Shoulder internal rotation 55  Shoulder external rotation 80                          (Blank rows = not tested)   A/PROM LEFT   eval LEFT 10/12/2024 LEFT 10/18/2024 LEFT 11/03/2024  Shoulder extension 40 32    Shoulder flexion 147 54, P! 130 P! 147  Shoulder abduction 167 40, P! 103 P! 140  Shoulder internal rotation 56     Shoulder external rotation 84                              (Blank rows = not tested)   CERVICAL AROM: All within normal limits     UPPER EXTREMITY STRENGTH: WNL   LYMPHEDEMA ASSESSMENTS (in cm):    LANDMARK RIGHT   eval RIGHT 10/12/2024  10 cm proximal to olecranon process 30.2 30.2  Olecranon process 24 24.5  10 cm proximal to ulnar styloid process 21.5 21.1  Just proximal to ulnar styloid process 14.2 14.7  Across hand at thumb web space 17.3 17.7  At base of 2nd digit 5.9 6.2  (Blank rows = not tested)   LANDMARK LEFT   eval LEFT 10/12/2024   10 cm proximal to olecranon process 28.8 29.96  Olecranon process 22.9 22.9  10 cm proximal to ulnar styloid process 20.8 21  Just proximal to ulnar styloid process 14.1 14.5  Across hand at thumb web space 17.5 17.7  At base of 2nd digit 6 6.2  (Blank rows =  not tested)    QUICK DASH SURVEY: 66%                                                                                                                            TREATMENT DATE:  11/16/24: Manual Therapy MFR to left UE cording in axilla STM to left pectoralis, axillary border, left lateral scapular region PROM left shoulder flexion, scaption, abduction, and D2 with scapular depression by therapist throughout Pt requested leaving at 1045 again due to her transportation  11/14/2024 Pulleys flexion and abduction x 2 min ea Supine wand scaption x 5 Snow angels x 5, 10 sec hold Alternate shoulder flexion x 6 ea, horizontal abd x 5 ea Wall arc to stretch lateral trunk x 3 MFR to left UE cording, wrist extension in D2 flexion to stretch cords. STM to left pectorals, axillary border, left scapular region with cocoa butter PROM left shoulder flexion, scaption, abduction, ER Pt requested to leave early at 10;45  11/08/24: Therapeutic Exercises Pulleys into flex and abd x 2 min each Yellow ball roll up wall into flex x 10, then Lt abd x 5, very big stretch felt so stopped at 5 Therapeutic Activities  Supine over  half foam roll for following: Bil horz abd x 10, very tight; then bil UE scaption in a V x 10; and then bil UE abd in a snow angel x 10, 5 sec holds LTR x 5 each, 5 sec holds Manual Therapy PROM left shoulder flex, abd and D2 with scapular depression by therapist gently as pt was tender at superior shoulder Gentle MFR to cording in left UE that was mildly palpable today, this done with arms in varying positions during P/ROM STM to scar tissue in Lt axilla; then with cocoa butter to Lt UT and medial scapular border where trigger point palpable and pt ttp Scap Mobs into Lt protraction, retraction, and depression in Rt S/L  11/03/2024 Pt came at wrong time. Present 8;00 pt did not come so pt was brought back at 8;15 Supine supine wand flexion and scaption x 5 Snow angels x 5 Supine on half roll;alternate shoulder alternate flexion x 5 ea, bilateral flex x 5, scaption x 5, horizontal abd x 5,  Lower trunk rotation x 3 ea Single arm doorway stretch x 3 20 seconds Gentle MFR to cording in left UE. PROM left shoulder flex, scaption, abd, ER Discussed compression sleeve if she receives. Showed where it should fit on her arm, and advised only during daytime, and remove if uncomfortable. Bring with her next time.  11/01/2024 Discussed progress with interpreter Hanane present Pulleys flexion and abduction x 2 min ea Supine on half roll;alternate shoulder flex x 5, scaption x 5, horizontal abd x 5, snow angels x 5 LTR with arms outstretched x 5 ea Corner wall stretch 3 x 20 sec Gentle MFR to cording in left UE. PROM left shoulder flex, scaption, abd, ER  10/27/2024 Discussed progress Pulleys flexion  and abduction x 2 min ea Supine wand flex and scaption x 5 , 5 sec hold Gentle MFR to cording in left UE. PROM left shoulder flex, scaption, abd, ER 3 D AROM bilaterally flexion, scaption, horizontal abd x 5, snow angels x 5 LTR Bilaterally with arms outstretched x 5 ea Corner pectoral stretch  B x 3 B Updated HEP     PATIENT EDUCATION:  Access Code: [email or web address] URL: [email or web address] Date: 10/27/2024 Prepared by: Grayce Sheldon  Exercises - Supine Shoulder Flexion Extension AAROM with Dowel  - 2 x daily - 7 x weekly - 1 sets - 5 reps - 5 hold - Supine Lower Trunk Rotation  - 1 x daily - 7 x weekly - 1 sets - 5 reps - 10 hold - Corner Pec Major Stretch  - 2 x daily - 7 x weekly - 1 sets - 3 reps - 15 hold Education details: Pt was instructed in 4 post op exercises; supine flexion and stargazer and sitting or standing scapular retraction, and wall slide for abd. She performed each for 5 reps holding 3-5 seconds ea Person educated: Patient Education method: Explanation, Demonstration, and Handouts Education comprehension: verbalized understanding and returned demonstration  HOME EXERCISE PROGRAM: 4 post op exercises; flexion and stargazer in supine, scapular retraction sitting or standing, wall slides for abd in standing Supine wand, corner pec stretch, LTR ASSESSMENT:  CLINICAL IMPRESSION: Pt reports needing to leave early as her neighbor brought her but she plans to start driving herself. Pt reports increase fatigue with radiation sessions and recovery in general. She still has burning in upper arm but this is intermittent. She does report feeling her tightness is improving in her Lt axilla since start of care.   OBJECTIVE IMPAIRMENTS: decreased activity tolerance, decreased knowledge of condition, decreased ROM, decreased strength, impaired sensation, impaired UE functional use, postural dysfunction, and pain.   ACTIVITY LIMITATIONS: carrying, lifting, sleeping, bed mobility, dressing, reach over head, hygiene/grooming, and caring for others  PARTICIPATION LIMITATIONS: cleaning, laundry, community activity, and caring for her autistic child  PERSONAL FACTORS: 3+ comorbidities: left breast cancer s/p neoadjuvant chemo therapy, several surgeries, sepsis, 22  LN's removed, pending radiation are also affecting patient's functional outcome.   REHAB POTENTIAL: Good  CLINICAL DECISION MAKING: Evolving/moderate complexity  EVALUATION COMPLEXITY: Moderate  GOALS: Goals reviewed with patient? Yes    LONG TERM GOALS: (STG=LTG)      Name Target Date Goal status  1 Pt will be able to verbalize understanding of pertinent lymphedema risk reduction practices relevant to her dx specifically related to skin care.  Baseline:  No knowledge 03/30/2024 Achieved at eval  2 Pt will be able to return demo and/or verbalize understanding of the post op HEP related to regaining shoulder ROM. Baseline:  No knowledge 03/30/2024 Achieved at eval  3 Pt will be able to verbalize understanding of the importance of viewing the post op After Breast CA Class video for further lymphedema risk reduction education and therapeutic exercise.  Baseline:  No knowledge 03/30/2024 Achieved at eval  4 Pt will demo she has regained full shoulder ROM and function post operatively compared to baselines.  Baseline: See objective measurements taken today. 11/24/2023 RE-EVAL    5   Pt will have decreased pain by 60% or greater for improved daily activities 11/24/2023 RE-EVAL    6    Pt will be educated in precautions/risk factors for lymphedema and importance of SOZO screens, compression sleeve as preventative 11/24/2023 RE-EVAL  PLAN:  PT FREQUENCY: 2x/week  PT DURATION: 6 weeks  PLANNED INTERVENTIONS: 97164- PT Re-evaluation, 97750- Physical Performance Testing, 97110-Therapeutic exercises, 97530- Therapeutic activity, [id number]- Neuromuscular re-education, 97535- Self Care, [id number]- Manual therapy, [id number]- Gait training, [phone]- Orthotic Initial, [phone]- Orthotic/Prosthetic subsequent, and Patient/Family education  PLAN FOR NEXT SESSION: Do SOZO screen around Jan 7, Did sleeve arrive yet?, Review HEP, manual techniques for cording, PROM, STM prn . Progress as tolerated. See  hx  Aden Berwyn Caldron, PTA 11/16/2024, 11:00 AM  "

## 2024-11-18 ENCOUNTER — Ambulatory Visit
Admission: RE | Admit: 2024-11-18 | Discharge: 2024-11-18 | Disposition: A | Discharge status: Home / Self Care | Source: Ambulatory Visit | Attending: Radiation Oncology | Admitting: Radiation Oncology

## 2024-11-18 ENCOUNTER — Other Ambulatory Visit: Payer: Self-pay

## 2024-11-18 DIAGNOSIS — C50412 Malignant neoplasm of upper-outer quadrant of left female breast: Secondary | ICD-10-CM | POA: Diagnosis present

## 2024-11-18 DIAGNOSIS — Z170 Estrogen receptor positive status [ER+]: Secondary | ICD-10-CM | POA: Insufficient documentation

## 2024-11-18 LAB — RAD ONC ARIA SESSION SUMMARY
Course Elapsed Days: 23
Plan Fractions Treated to Date: 16
Plan Fractions Treated to Date: 16
Plan Prescribed Dose Per Fraction: 2 Gy
Plan Prescribed Dose Per Fraction: 2 Gy
Plan Total Fractions Prescribed: 25
Plan Total Fractions Prescribed: 25
Plan Total Prescribed Dose: 50 Gy
Plan Total Prescribed Dose: 50 Gy
Reference Point Dosage Given to Date: 32 Gy
Reference Point Dosage Given to Date: 32 Gy
Reference Point Session Dosage Given: 2 Gy
Reference Point Session Dosage Given: 2 Gy
Session Number: 16

## 2024-11-21 ENCOUNTER — Inpatient Hospital Stay: Attending: Licensed Clinical Social Worker | Admitting: Licensed Clinical Social Worker

## 2024-11-21 ENCOUNTER — Ambulatory Visit
Admission: RE | Admit: 2024-11-21 | Discharge: 2024-11-21 | Disposition: A | Discharge status: Home / Self Care | Source: Ambulatory Visit | Attending: Radiation Oncology | Admitting: Radiation Oncology

## 2024-11-21 ENCOUNTER — Other Ambulatory Visit: Payer: Self-pay

## 2024-11-21 DIAGNOSIS — C50412 Malignant neoplasm of upper-outer quadrant of left female breast: Secondary | ICD-10-CM | POA: Diagnosis not present

## 2024-11-21 LAB — RAD ONC ARIA SESSION SUMMARY
Course Elapsed Days: 26
Plan Fractions Treated to Date: 17
Plan Fractions Treated to Date: 17
Plan Prescribed Dose Per Fraction: 2 Gy
Plan Prescribed Dose Per Fraction: 2 Gy
Plan Total Fractions Prescribed: 25
Plan Total Fractions Prescribed: 25
Plan Total Prescribed Dose: 50 Gy
Plan Total Prescribed Dose: 50 Gy
Reference Point Dosage Given to Date: 34 Gy
Reference Point Dosage Given to Date: 34 Gy
Reference Point Session Dosage Given: 2 Gy
Reference Point Session Dosage Given: 2 Gy
Session Number: 17

## 2024-11-21 NOTE — Progress Notes (Signed)
 [facility] CSW Progress Note  Clinical Child Psychotherapist contacted pt's husband by phone to follow-up on application for financial assistance for [employer]. No answer & no VM available. E-mail sent with update.    Interventions: Completed application for assistance for [employer]      Follow Up Plan:  Patient will contact CSW with any support or resource needs    Aashka Salomone E Shomari Scicchitano, LCSW Clinical Social Worker [facility]    Patient is participating in a Managed Medicaid Plan:  Yes

## 2024-11-22 ENCOUNTER — Other Ambulatory Visit: Payer: Self-pay

## 2024-11-22 ENCOUNTER — Ambulatory Visit
Admission: RE | Admit: 2024-11-22 | Discharge: 2024-11-22 | Disposition: A | Source: Ambulatory Visit | Attending: Radiation Oncology

## 2024-11-22 ENCOUNTER — Ambulatory Visit: Attending: Hematology and Oncology

## 2024-11-22 DIAGNOSIS — C50412 Malignant neoplasm of upper-outer quadrant of left female breast: Secondary | ICD-10-CM | POA: Insufficient documentation

## 2024-11-22 DIAGNOSIS — R293 Abnormal posture: Secondary | ICD-10-CM | POA: Diagnosis present

## 2024-11-22 DIAGNOSIS — Z9189 Other specified personal risk factors, not elsewhere classified: Secondary | ICD-10-CM | POA: Diagnosis present

## 2024-11-22 DIAGNOSIS — M25612 Stiffness of left shoulder, not elsewhere classified: Secondary | ICD-10-CM | POA: Diagnosis present

## 2024-11-22 DIAGNOSIS — L905 Scar conditions and fibrosis of skin: Secondary | ICD-10-CM | POA: Diagnosis present

## 2024-11-22 DIAGNOSIS — Z170 Estrogen receptor positive status [ER+]: Secondary | ICD-10-CM | POA: Diagnosis present

## 2024-11-22 DIAGNOSIS — L7682 Other postprocedural complications of skin and subcutaneous tissue: Secondary | ICD-10-CM | POA: Diagnosis present

## 2024-11-22 LAB — RAD ONC ARIA SESSION SUMMARY
Course Elapsed Days: 27
Plan Fractions Treated to Date: 18
Plan Fractions Treated to Date: 18
Plan Prescribed Dose Per Fraction: 2 Gy
Plan Prescribed Dose Per Fraction: 2 Gy
Plan Total Fractions Prescribed: 25
Plan Total Fractions Prescribed: 25
Plan Total Prescribed Dose: 50 Gy
Plan Total Prescribed Dose: 50 Gy
Reference Point Dosage Given to Date: 36 Gy
Reference Point Dosage Given to Date: 36 Gy
Reference Point Session Dosage Given: 2 Gy
Reference Point Session Dosage Given: 2 Gy
Session Number: 18

## 2024-11-22 NOTE — Therapy (Signed)
 " OUTPATIENT PHYSICAL THERAPY  UPPER EXTREMITY ONCOLOGY TREATMENT  Patient Name: Wanda Garcia MRN: [id number] DOB:[birth year], 49 y.o., female Today's Date: 11/22/2024  END OF SESSION:  PT End of Session - 11/22/24 1002     Visit Number 11    Number of Visits 16    Date for Recertification  01/10/25    Authorization Type [employer]    Authorization - Visit Number 10    Authorization - Number of Visits 15    PT Start Time 1003    PT Stop Time 1056    PT Time Calculation (min) 53 min    Activity Tolerance Patient tolerated treatment well    Behavior During Therapy WFL for tasks assessed/performed           Past Medical History:  Diagnosis Date   Arthritis    Breast cancer (HCC) 03/2024   left   Depression    situational   Headache    Neuropathy 07/20/2024   in fingers and toes   Past Surgical History:  Procedure Laterality Date   AXILLARY LYMPH NODE DISSECTION Left 09/14/2024   Procedure: LYMPHADENECTOMY, AXILLARY;  Surgeon: Aron Shoulders, MD;  Location: [facility];  Service: General;  Laterality: Left;  LEFT AXILLARY LYMPH NODE DISSECTION GEN w/PEC BLOCK   AXILLARY SENTINEL NODE BIOPSY Left 08/23/2024   Procedure: LEFT SENTINEL NODE BIOPSY AXILLARY;  Surgeon: Aron Shoulders, MD;  Location: [facility];  Service: General;  Laterality: Left;  LEFT SENTINEL NODE BIOPSY   BREAST BIOPSY Left 03/2024   CESAREAN SECTION N/A 04/06/2017   Procedure: CESAREAN SECTION;  Surgeon: Herchel Gloris LABOR, MD;  Location: [facility];  Service: Obstetrics;  Laterality: N/A;   CHOLECYSTECTOMY N/A 04/26/2019   Procedure: LAPAROSCOPIC CHOLECYSTECTOMY WITH POSSIBLE INTRAOPERATIVE CHOLANGIOGRAM;  Surgeon: Vernetta Berg, MD;  Location: [facility];  Service: General;  Laterality: N/A;   CYST EXCISION Right 2024   chest   IRRIGATION AND DEBRIDEMENT HEMATOMA Left 09/28/2024   Procedure: IRRIGATION AND DEBRIDEMENT HEMATOMA;  Surgeon: Aron Shoulders, MD;  Location: [facility];  Service: General;   Laterality: Left;  washout left axilla   PORT-A-CATH REMOVAL N/A 08/23/2024   Procedure: REMOVAL PORT-A-CATH;  Surgeon: Aron Shoulders, MD;  Location: [facility];  Service: General;  Laterality: N/A;   PORTACATH PLACEMENT N/A 04/04/2024   Procedure: INSERTION, TUNNELED CENTRAL VENOUS DEVICE, WITH PORT;  Surgeon: Aron Shoulders, MD;  Location: [facility];  Service: General;  Laterality: N/A;   Patient Active Problem List   Diagnosis Date Noted   Sepsis (HCC) 09/24/2024   Port-A-Cath in place 04/06/2024   Malignant neoplasm of upper-outer quadrant of left breast in female, estrogen receptor positive (HCC) 03/28/2024   Neck pain 07/09/2018   Trigger finger of left thumb 07/09/2018   Pain in left wrist 07/09/2018   Pain in right wrist 07/09/2018   S/P cesarean section 04/05/2017   Advanced maternal age, primigravida in first trimester, antepartum     PCP:   REFERRING PROVIDER: Mackey Chad, MD  REFERRING DIAG: Left breast Cancer, at risk for lymphedema  THERAPY DIAG:  Stiffness of left shoulder, not elsewhere classified  Axillary web syndrome  At risk for lymphedema  Abnormal posture  Malignant neoplasm of upper-outer quadrant of left breast in female, estrogen receptor positive (HCC)  ONSET DATE: 08/23/2024  Rationale for Evaluation and Treatment: Rehabilitation  SUBJECTIVE:  SUBJECTIVE STATEMENT:   I still have intermittent burning from my axilla into upper arm but that comes and goes. I have been so tired with radiation . My husband did not order the sleeve for me.  Pain is 60 % better overall. My armpit is still very sore, and the cording very tight since starting radiation. I can't sleep on my left side. PERTINENT HISTORY:  Pt is s/p Neoadjuvant chemotherapy and Left lumpectomy with surgery performed  on 08/23/2024 for 2 primary tumors. She had 5+/5 LN's removed at that time. She had an ALND on 09/14/2024 with 3+/17 LN's for a total of 8+/22 LN's. She was hospitalized from 09/24/2024-10/04/2024 for sepsis/cellulitis at the surgical site. She had her drain removed yesterday. 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 PAIN:  Are you having pain? No, just sore in Lt axilla  PRECAUTIONS: Right UE Lymphedema risk due to 8+/22 LN's removed, neuropathy  RED FLAGS: None   WEIGHT BEARING RESTRICTIONS: No  FALLS:  Has patient fallen in last 6 months? No  LEISURE: cares of son who is autistic  HAND DOMINANCE: right   PRIOR LEVEL OF FUNCTION: Independent  PATIENT GOALS: return to PLOF   OBJECTIVE: Note: Objective measures were completed at Evaluation unless otherwise noted.  COGNITION: Overall cognitive status: Within functional limits for tasks assessed   PALPATION: Very tender/sensitive Left UE    OBSERVATIONS / OTHER ASSESSMENTS: large breast incision healed, axillary incision covered with bandage;just had drain removed yesterday. When bandage removed and reapplied this am very little on bandage. Palpable cording left  axilla into forearm and pt very sensitive to light touch. No signs of UE lymphedema today  SENSATION: Light touch: Deficits     POSTURE: forward head, rounded shoulders   UPPER EXTREMITY STRENGTH:   UPPER EXTREMITY AROM/PROM:   A/PROM RIGHT   eval    Shoulder extension 47  Shoulder flexion 149  Shoulder abduction 154  Shoulder internal rotation 55  Shoulder external rotation 80                          (Blank rows = not tested)   A/PROM LEFT   eval LEFT 10/12/2024 LEFT 10/18/2024 LEFT 11/03/2024 LEFT 11/22/2024  Shoulder extension 40 32   58  Shoulder flexion 147 54, P! 130 P! 147 148  Shoulder abduction 167 40, P! 103 P! 140 120 arm straight, 130 elbow flexed  Shoulder internal rotation 56      Shoulder external rotation 84                               (Blank rows = not tested)   CERVICAL AROM: All within normal limits     UPPER EXTREMITY STRENGTH: WNL   LYMPHEDEMA ASSESSMENTS (in cm):    LANDMARK RIGHT   eval RIGHT 10/12/2024  10 cm proximal to olecranon process 30.2 30.2  Olecranon process 24 24.5  10 cm proximal to ulnar styloid process 21.5 21.1  Just proximal to ulnar styloid process 14.2 14.7  Across hand at thumb web space 17.3 17.7  At base of 2nd digit 5.9 6.2  (Blank rows = not tested)   LANDMARK LEFT   eval LEFT 10/12/2024   10 cm proximal to olecranon process 28.8 29.96  Olecranon process 22.9 22.9  10 cm proximal to ulnar styloid process 20.8 21  Just proximal to ulnar styloid process 14.1 14.5  Across hand at thumb web space 17.5 17.7  At base of 2nd digit 6 6.2  (Blank rows = not tested)    QUICK DASH SURVEY: 66% ,    11/22/2024 48%                                                                                                                            TREATMENT DATE:  11/23/2023 Discussed progress toward goals Discussed ordering sleeve; husband has not done. Prefers to do here and will bring credit card/ may try [employer] with insurance Alternate AROM flexion x 5, bilateral flex x 5, bilateral scaption x 5, bilateral horizontal abd x 5, snow angels x 5 MFR to left UE cording in axilla Cupping to cording in left UE using cocoa butter with clear cup, gently PROM left shoulder flexion, scaption, abduction, and D2 with scapular depression by therapist throughout Measured AROM left shoulder for note Performed SOZO screen; well into the green and below baseline Discussed precautions for lymphedema;BP, needle sticks, high temperatures; hot tub, sauna's, sleeve for working out or repetitive motion activities. 11/16/24: Manual Therapy MFR to left UE cording in axilla STM to left pectoralis, axillary border, left lateral scapular region PROM left shoulder flexion, scaption, abduction, and D2 with scapular  depression by therapist throughout Pt requested leaving at 1045 again due to her transportation  11/14/2024 Pulleys flexion and abduction x 2 min ea Supine wand scaption x 5 Snow angels x 5, 10 sec hold Alternate shoulder flexion x 6 ea, horizontal abd x 5 ea Wall arc to stretch lateral trunk x 3 MFR to left UE cording, wrist extension in D2 flexion to stretch cords. STM to left pectorals, axillary border, left scapular region with cocoa butter PROM left shoulder flexion, scaption, abduction, ER Pt requested to leave early at 10;45  11/08/24: Therapeutic Exercises Pulleys into flex and abd x 2 min each Yellow ball roll up wall into flex x 10, then Lt abd x 5, very big stretch felt so stopped at 5 Therapeutic Activities  Supine over half foam roll for following: Bil horz abd x 10, very tight; then bil UE scaption in a V x 10; and then bil UE abd in a snow angel x 10, 5 sec holds LTR x 5 each, 5 sec holds Manual Therapy PROM left shoulder flex, abd and D2 with scapular depression by therapist gently as pt was tender at superior shoulder Gentle MFR to cording in left UE that was mildly palpable today, this done with arms in varying positions during P/ROM STM to scar tissue in Lt axilla; then with cocoa butter to Lt UT and medial scapular border where trigger point palpable and pt ttp Scap Mobs into Lt protraction, retraction, and depression in Rt S/L  11/03/2024 Pt came at wrong time. Present 8;00 pt did not come so pt was brought back at 8;15 Supine supine wand flexion and scaption x 5 Snow angels x 5 Supine on half  roll;alternate shoulder alternate flexion x 5 ea, bilateral flex x 5, scaption x 5, horizontal abd x 5,  Lower trunk rotation x 3 ea Single arm doorway stretch x 3 20 seconds Gentle MFR to cording in left UE. PROM left shoulder flex, scaption, abd, ER Discussed compression sleeve if she receives. Showed where it should fit on her arm, and advised only during daytime,  and remove if uncomfortable. Bring with her next time.  11/01/2024 Discussed progress with interpreter Hanane present Pulleys flexion and abduction x 2 min ea Supine on half roll;alternate shoulder flex x 5, scaption x 5, horizontal abd x 5, snow angels x 5 LTR with arms outstretched x 5 ea Corner wall stretch 3 x 20 sec Gentle MFR to cording in left UE. PROM left shoulder flex, scaption, abd, ER  10/27/2024 Discussed progress Pulleys flexion and abduction x 2 min ea Supine wand flex and scaption x 5 , 5 sec hold Gentle MFR to cording in left UE. PROM left shoulder flex, scaption, abd, ER 3 D AROM bilaterally flexion, scaption, horizontal abd x 5, snow angels x 5 LTR Bilaterally with arms outstretched x 5 ea Corner pectoral stretch B x 3 B Updated HEP     PATIENT EDUCATION:  Access Code: [email or web address] URL: [email or web address] Date: 10/27/2024 Prepared by: Grayce Sheldon  Exercises - Supine Shoulder Flexion Extension AAROM with Dowel  - 2 x daily - 7 x weekly - 1 sets - 5 reps - 5 hold - Supine Lower Trunk Rotation  - 1 x daily - 7 x weekly - 1 sets - 5 reps - 10 hold - Corner Pec Major Stretch  - 2 x daily - 7 x weekly - 1 sets - 3 reps - 15 hold Education details: Pt was instructed in 4 post op exercises; supine flexion and stargazer and sitting or standing scapular retraction, and wall slide for abd. She performed each for 5 reps holding 3-5 seconds ea Person educated: Patient Education method: Explanation, Demonstration, and Handouts Education comprehension: verbalized understanding and returned demonstration  HOME EXERCISE PROGRAM: 4 post op exercises; flexion and stargazer in supine, scapular retraction sitting or standing, wall slides for abd in standing Supine wand, corner pec stretch, LTR ASSESSMENT:  CLINICAL IMPRESSION: Pt dates extended to match authorization as she has 5 more visits left in insurance authorization before reauthorization is  required. Pt reports increase fatigue with radiation sessions and recovery in general. She still has burning in upper arm but this is intermittent. Her overall pain is improved by 60% from the beginning, however, she is still bothered by cording, which has been more limiting with abduction since she started radiation. Remaining ROM is doing quite well and quick dash has improved by 18 points since Re-eval. Pt will benefit from continued therapy to address remaining pain/deficits and limitations in function.  OBJECTIVE IMPAIRMENTS: decreased activity tolerance, decreased knowledge of condition, decreased ROM, decreased strength, impaired sensation, impaired UE functional use, postural dysfunction, and pain.   ACTIVITY LIMITATIONS: carrying, lifting, sleeping, bed mobility, dressing, reach over head, hygiene/grooming, and caring for others  PARTICIPATION LIMITATIONS: cleaning, laundry, community activity, and caring for her autistic child  PERSONAL FACTORS: 3+ comorbidities: left breast cancer s/p neoadjuvant chemo therapy, several surgeries, sepsis, 22 LN's removed, pending radiation are also affecting patient's functional outcome.   REHAB POTENTIAL: Good  CLINICAL DECISION MAKING: Evolving/moderate complexity  EVALUATION COMPLEXITY: Moderate  GOALS: Goals reviewed with patient? Yes    LONG TERM GOALS: (STG=LTG)  Name Target Date Goal status  1 Pt will be able to verbalize understanding of pertinent lymphedema risk reduction practices relevant to her dx specifically related to skin care.  Baseline:  No knowledge 03/30/2024 Achieved at eval  2 Pt will be able to return demo and/or verbalize understanding of the post op HEP related to regaining shoulder ROM. Baseline:  No knowledge 03/30/2024 Achieved at eval  3 Pt will be able to verbalize understanding of the importance of viewing the post op After Breast CA Class video for further lymphedema risk reduction education and therapeutic  exercise.  Baseline:  No knowledge 03/30/2024 Achieved at eval  4 Pt will demo she has regained full shoulder ROM and function post operatively compared to baselines.  Baseline: See objective measurements taken today. 11/24/2023 IN Progress Still very limited for abduction    5   Pt will have decreased pain by 60% or greater for improved daily activities 11/24/2023 MET 11/22/2024    6    Pt will be educated in precautions/risk factors for lymphedema and importance of SOZO screens, compression sleeve as preventative 11/24/2023 In Progress Needs review          PLAN:  PT FREQUENCY: 2x/week  PT DURATION: 6 weeks  PLANNED INTERVENTIONS: 97164- PT Re-evaluation, 97750- Physical Performance Testing, 97110-Therapeutic exercises, 97530- Therapeutic activity, 97112- Neuromuscular re-education, 97535- Self Care, [id number]- Manual therapy, [id number]- Gait training, [phone]- Orthotic Initial, [phone]- Orthotic/Prosthetic subsequent, and Patient/Family education  PLAN FOR NEXT SESSION: Do SOZO screen around Jan 7, Did sleeve arrive yet?, Review HEP, manual techniques for cording, PROM, STM prn . Progress as tolerated. See hx  Grayce JINNY Sheldon, PT 11/22/2024, 11:00 AM  "

## 2024-11-23 ENCOUNTER — Other Ambulatory Visit: Payer: Self-pay

## 2024-11-23 ENCOUNTER — Ambulatory Visit
Admission: RE | Admit: 2024-11-23 | Discharge: 2024-11-23 | Disposition: A | Discharge status: Home / Self Care | Source: Ambulatory Visit | Attending: Radiation Oncology | Admitting: Radiation Oncology

## 2024-11-23 DIAGNOSIS — C50412 Malignant neoplasm of upper-outer quadrant of left female breast: Secondary | ICD-10-CM | POA: Diagnosis not present

## 2024-11-23 LAB — RAD ONC ARIA SESSION SUMMARY
Course Elapsed Days: 28
Plan Fractions Treated to Date: 19
Plan Fractions Treated to Date: 19
Plan Prescribed Dose Per Fraction: 2 Gy
Plan Prescribed Dose Per Fraction: 2 Gy
Plan Total Fractions Prescribed: 25
Plan Total Fractions Prescribed: 25
Plan Total Prescribed Dose: 50 Gy
Plan Total Prescribed Dose: 50 Gy
Reference Point Dosage Given to Date: 38 Gy
Reference Point Dosage Given to Date: 38 Gy
Reference Point Session Dosage Given: 2 Gy
Reference Point Session Dosage Given: 2 Gy
Session Number: 19

## 2024-11-24 ENCOUNTER — Other Ambulatory Visit: Payer: Self-pay

## 2024-11-24 ENCOUNTER — Ambulatory Visit

## 2024-11-24 ENCOUNTER — Inpatient Hospital Stay: Admitting: Licensed Clinical Social Worker

## 2024-11-24 ENCOUNTER — Ambulatory Visit
Admission: RE | Admit: 2024-11-24 | Discharge: 2024-11-24 | Disposition: A | Discharge status: Home / Self Care | Source: Ambulatory Visit | Attending: Radiation Oncology | Admitting: Radiation Oncology

## 2024-11-24 DIAGNOSIS — R293 Abnormal posture: Secondary | ICD-10-CM

## 2024-11-24 DIAGNOSIS — M25612 Stiffness of left shoulder, not elsewhere classified: Secondary | ICD-10-CM

## 2024-11-24 DIAGNOSIS — C50412 Malignant neoplasm of upper-outer quadrant of left female breast: Secondary | ICD-10-CM

## 2024-11-24 DIAGNOSIS — Z9189 Other specified personal risk factors, not elsewhere classified: Secondary | ICD-10-CM

## 2024-11-24 DIAGNOSIS — L7682 Other postprocedural complications of skin and subcutaneous tissue: Secondary | ICD-10-CM

## 2024-11-24 LAB — RAD ONC ARIA SESSION SUMMARY
Course Elapsed Days: 29
Plan Fractions Treated to Date: 20
Plan Fractions Treated to Date: 20
Plan Prescribed Dose Per Fraction: 2 Gy
Plan Prescribed Dose Per Fraction: 2 Gy
Plan Total Fractions Prescribed: 25
Plan Total Fractions Prescribed: 25
Plan Total Prescribed Dose: 50 Gy
Plan Total Prescribed Dose: 50 Gy
Reference Point Dosage Given to Date: 40 Gy
Reference Point Dosage Given to Date: 40 Gy
Reference Point Session Dosage Given: 2 Gy
Reference Point Session Dosage Given: 2 Gy
Session Number: 20

## 2024-11-24 NOTE — Progress Notes (Signed)
 [facility] CSW Progress Note  Clinical Child Psychotherapist contacted caregiver by phone to follow-up on [employer] application.    Interventions: Notified pt's husband that pt was approved for assistance through [employer]. Sent husband the approval letter.      Follow Up Plan:  Patient will contact CSW with any support or resource needs    Saralyn Willison E Jaqwon Manfred, LCSW Clinical Social Worker [facility]    Patient is participating in a Managed Medicaid Plan:  Yes

## 2024-11-24 NOTE — Therapy (Signed)
 " OUTPATIENT PHYSICAL THERAPY  UPPER EXTREMITY ONCOLOGY TREATMENT  Patient Name: Wanda Garcia MRN: [id number] DOB:06-Aug-1976, 49 y.o., female Today's Date: 11/24/2024  END OF SESSION:  PT End of Session - 11/24/24 0959     Visit Number 12    Number of Visits 16    Date for Recertification  01/10/25    Authorization Time Period 11/26-2/24/2025    Authorization - Visit Number 11    Authorization - Number of Visits 15    PT Start Time 1000    Activity Tolerance Patient tolerated treatment well    Behavior During Therapy [facility] for tasks assessed/performed           Past Medical History:  Diagnosis Date   Arthritis    Breast cancer (HCC) 03/2024   left   Depression    situational   Headache    Neuropathy 07/20/2024   in fingers and toes   Past Surgical History:  Procedure Laterality Date   AXILLARY LYMPH NODE DISSECTION Left 09/14/2024   Procedure: LYMPHADENECTOMY, AXILLARY;  Surgeon: Aron Shoulders, MD;  Location: [facility];  Service: General;  Laterality: Left;  LEFT AXILLARY LYMPH NODE DISSECTION GEN w/PEC BLOCK   AXILLARY SENTINEL NODE BIOPSY Left 08/23/2024   Procedure: LEFT SENTINEL NODE BIOPSY AXILLARY;  Surgeon: Aron Shoulders, MD;  Location: [facility];  Service: General;  Laterality: Left;  LEFT SENTINEL NODE BIOPSY   BREAST BIOPSY Left 03/2024   CESAREAN SECTION N/A 04/06/2017   Procedure: CESAREAN SECTION;  Surgeon: Herchel Gloris LABOR, MD;  Location: [facility];  Service: Obstetrics;  Laterality: N/A;   CHOLECYSTECTOMY N/A 04/26/2019   Procedure: LAPAROSCOPIC CHOLECYSTECTOMY WITH POSSIBLE INTRAOPERATIVE CHOLANGIOGRAM;  Surgeon: Vernetta Berg, MD;  Location: [facility];  Service: General;  Laterality: N/A;   CYST EXCISION Right 2024   chest   IRRIGATION AND DEBRIDEMENT HEMATOMA Left 09/28/2024   Procedure: IRRIGATION AND DEBRIDEMENT HEMATOMA;  Surgeon: Aron Shoulders, MD;  Location: [facility];  Service: General;  Laterality: Left;  washout left axilla   PORT-A-CATH  REMOVAL N/A 08/23/2024   Procedure: REMOVAL PORT-A-CATH;  Surgeon: Aron Shoulders, MD;  Location: [facility];  Service: General;  Laterality: N/A;   PORTACATH PLACEMENT N/A 04/04/2024   Procedure: INSERTION, TUNNELED CENTRAL VENOUS DEVICE, WITH PORT;  Surgeon: Aron Shoulders, MD;  Location: [facility];  Service: General;  Laterality: N/A;   Patient Active Problem List   Diagnosis Date Noted   Sepsis (HCC) 09/24/2024   Port-A-Cath in place 04/06/2024   Malignant neoplasm of upper-outer quadrant of left breast in female, estrogen receptor positive (HCC) 03/28/2024   Neck pain 07/09/2018   Trigger finger of left thumb 07/09/2018   Pain in left wrist 07/09/2018   Pain in right wrist 07/09/2018   S/P cesarean section 04/05/2017   Advanced maternal age, primigravida in first trimester, antepartum     PCP:   REFERRING PROVIDER: Mackey Chad, MD  REFERRING DIAG: Left breast Cancer, at risk for lymphedema  THERAPY DIAG:  Stiffness of left shoulder, not elsewhere classified  Axillary web syndrome  At risk for lymphedema  Abnormal posture  Malignant neoplasm of upper-outer quadrant of left breast in female, estrogen receptor positive (HCC)  ONSET DATE: 08/23/2024  Rationale for Evaluation and Treatment: Rehabilitation  SUBJECTIVE:  SUBJECTIVE STATEMENT:   I felt better after the cupping last time. I am ready to order my sleeve, can you help. PERTINENT HISTORY:  Pt is s/p Neoadjuvant chemotherapy and Left lumpectomy with surgery performed on 08/23/2024 for 2 primary tumors. She had 5+/5 LN's removed at that time. She had an ALND on 09/14/2024 with 3+/17 LN's for a total of 8+/22 LN's. She was hospitalized from 09/24/2024-10/04/2024 for sepsis/cellulitis at the surgical site. She had her drain removed yesterday. 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 PAIN:  Are you having pain? No, just sore in Lt axilla  PRECAUTIONS: Right UE Lymphedema risk due to 8+/22 LN's removed, neuropathy  RED FLAGS: None   WEIGHT BEARING RESTRICTIONS: No  FALLS:  Has patient fallen in last 6 months? No  LEISURE: cares of son who is autistic  HAND DOMINANCE: right   PRIOR LEVEL OF FUNCTION: Independent  PATIENT GOALS: return to PLOF   OBJECTIVE: Note: Objective measures were completed at Evaluation unless otherwise noted.  COGNITION: Overall cognitive status: Within functional limits for tasks assessed   PALPATION: Very tender/sensitive Left UE    OBSERVATIONS / OTHER ASSESSMENTS: large breast incision healed, axillary incision covered with bandage;just had drain removed yesterday. When bandage removed and reapplied this am very little on bandage. Palpable cording left  axilla into forearm and pt very sensitive to light touch. No signs of UE lymphedema today  SENSATION: Light touch: Deficits     POSTURE: forward head, rounded shoulders   UPPER EXTREMITY STRENGTH:   UPPER EXTREMITY AROM/PROM:   A/PROM RIGHT   eval    Shoulder extension 47  Shoulder flexion 149  Shoulder abduction 154  Shoulder internal rotation 55  Shoulder external rotation 80                          (Blank rows = not tested)   A/PROM LEFT   eval LEFT 10/12/2024 LEFT 10/18/2024 LEFT 11/03/2024 LEFT 11/22/2024  Shoulder extension 40 32   58  Shoulder flexion 147 54, P! 130 P! 147 148  Shoulder abduction 167 40, P! 103 P! 140 120 arm straight, 130 elbow flexed  Shoulder internal rotation 56      Shoulder external rotation 84                              (Blank rows = not tested)   CERVICAL AROM: All within normal limits     UPPER EXTREMITY STRENGTH: WNL   LYMPHEDEMA ASSESSMENTS (in cm):    LANDMARK RIGHT   eval RIGHT 10/12/2024  10 cm proximal to olecranon process 30.2 30.2  Olecranon process 24 24.5  10 cm proximal to  ulnar styloid process 21.5 21.1  Just proximal to ulnar styloid process 14.2 14.7  Across hand at thumb web space 17.3 17.7  At base of 2nd digit 5.9 6.2  (Blank rows = not tested)   LANDMARK LEFT   eval LEFT 10/12/2024   10 cm proximal to olecranon process 28.8 29.96  Olecranon process 22.9 22.9  10 cm proximal to ulnar styloid process 20.8 21  Just proximal to ulnar styloid process 14.1 14.5  Across hand at thumb web space 17.5 17.7  At base of 2nd digit 6 6.2  (Blank rows = not tested)    QUICK DASH SURVEY: 66% ,    11/22/2024 48%  TREATMENT DATE:   11/25/2023 Measured for garment and had pt try on size 1 EW, and Size 2 reg. Size 2 fit better [id number].Order number for [employer] Size 2 compression sleeve on [employer]. Pts husband completed online form and CC info AROM bilateral flexion, scaption, horizontal abd x 5 MFR to left UE cording in axilla Cupping to cording in left UE using cocoa butter with clear cup, gently PROM left shoulder flexion, scaption, abduction, and D2 with scapular depression by therapist throughout 11/23/2023 Discussed progress toward goals Discussed ordering sleeve; husband has not done. Prefers to do here and will bring credit card/ may try [employer] with insurance Alternate AROM flexion x 5, bilateral flex x 5, bilateral scaption x 5, bilateral horizontal abd x 5, snow angels x 5 MFR to left UE cording in axilla Cupping to cording in left UE using cocoa butter with clear cup, gently PROM left shoulder flexion, scaption, abduction, and D2 with scapular depression by therapist throughout Measured AROM left shoulder for note Performed SOZO screen; well into the green and below baseline Discussed precautions for lymphedema;BP, needle sticks, high temperatures; hot tub, sauna's, sleeve for working out or repetitive motion  activities. 11/16/24: Manual Therapy MFR to left UE cording in axilla STM to left pectoralis, axillary border, left lateral scapular region PROM left shoulder flexion, scaption, abduction, and D2 with scapular depression by therapist throughout Pt requested leaving at 1045 again due to her transportation  11/14/2024 Pulleys flexion and abduction x 2 min ea Supine wand scaption x 5 Snow angels x 5, 10 sec hold Alternate shoulder flexion x 6 ea, horizontal abd x 5 ea Wall arc to stretch lateral trunk x 3 MFR to left UE cording, wrist extension in D2 flexion to stretch cords. STM to left pectorals, axillary border, left scapular region with cocoa butter PROM left shoulder flexion, scaption, abduction, ER Pt requested to leave early at 10;45  11/08/24: Therapeutic Exercises Pulleys into flex and abd x 2 min each Yellow ball roll up wall into flex x 10, then Lt abd x 5, very big stretch felt so stopped at 5 Therapeutic Activities  Supine over half foam roll for following: Bil horz abd x 10, very tight; then bil UE scaption in a V x 10; and then bil UE abd in a snow angel x 10, 5 sec holds LTR x 5 each, 5 sec holds Manual Therapy PROM left shoulder flex, abd and D2 with scapular depression by therapist gently as pt was tender at superior shoulder Gentle MFR to cording in left UE that was mildly palpable today, this done with arms in varying positions during P/ROM STM to scar tissue in Lt axilla; then with cocoa butter to Lt UT and medial scapular border where trigger point palpable and pt ttp Scap Mobs into Lt protraction, retraction, and depression in Rt S/L  11/03/2024 Pt came at wrong time. Present 8;00 pt did not come so pt was brought back at 8;15 Supine supine wand flexion and scaption x 5 Snow angels x 5 Supine on half roll;alternate shoulder alternate flexion x 5 ea, bilateral flex x 5, scaption x 5, horizontal abd x 5,  Lower trunk rotation x 3 ea Single arm doorway  stretch x 3 20 seconds Gentle MFR to cording in left UE. PROM left shoulder flex, scaption, abd, ER Discussed compression sleeve if she receives. Showed where it should fit on her arm, and advised only during daytime, and remove if uncomfortable. Bring with her next  time.  11/01/2024 Discussed progress with interpreter Hanane present Pulleys flexion and abduction x 2 min ea Supine on half roll;alternate shoulder flex x 5, scaption x 5, horizontal abd x 5, snow angels x 5 LTR with arms outstretched x 5 ea Corner wall stretch 3 x 20 sec Gentle MFR to cording in left UE. PROM left shoulder flex, scaption, abd, ER  10/27/2024 Discussed progress Pulleys flexion and abduction x 2 min ea Supine wand flex and scaption x 5 , 5 sec hold Gentle MFR to cording in left UE. PROM left shoulder flex, scaption, abd, ER 3 D AROM bilaterally flexion, scaption, horizontal abd x 5, snow angels x 5 LTR Bilaterally with arms outstretched x 5 ea Corner pectoral stretch B x 3 B Updated HEP     PATIENT EDUCATION:  Access Code: [email or web address] URL: [email or web address] Date: 10/27/2024 Prepared by: Grayce Sheldon  Exercises - Supine Shoulder Flexion Extension AAROM with Dowel  - 2 x daily - 7 x weekly - 1 sets - 5 reps - 5 hold - Supine Lower Trunk Rotation  - 1 x daily - 7 x weekly - 1 sets - 5 reps - 10 hold - Corner Pec Major Stretch  - 2 x daily - 7 x weekly - 1 sets - 3 reps - 15 hold Education details: Pt was instructed in 4 post op exercises; supine flexion and stargazer and sitting or standing scapular retraction, and wall slide for abd. She performed each for 5 reps holding 3-5 seconds ea Person educated: Patient Education method: Explanation, Demonstration, and Handouts Education comprehension: verbalized understanding and returned demonstration  HOME EXERCISE PROGRAM: 4 post op exercises; flexion and stargazer in supine, scapular retraction sitting or standing, wall slides for abd  in standing Supine wand, corner pec stretch, LTR ASSESSMENT:  CLINICAL IMPRESSION: Pt assisted with ordering garment on compression guru; husband did address and CC info. Pt got benefit from cupping last time so was continued today. Much less tenderness today with release techniques. Tightness in axilla with end range of motion due to cording.  OBJECTIVE IMPAIRMENTS: decreased activity tolerance, decreased knowledge of condition, decreased ROM, decreased strength, impaired sensation, impaired UE functional use, postural dysfunction, and pain.   ACTIVITY LIMITATIONS: carrying, lifting, sleeping, bed mobility, dressing, reach over head, hygiene/grooming, and caring for others  PARTICIPATION LIMITATIONS: cleaning, laundry, community activity, and caring for her autistic child  PERSONAL FACTORS: 3+ comorbidities: left breast cancer s/p neoadjuvant chemo therapy, several surgeries, sepsis, 22 LN's removed, pending radiation are also affecting patient's functional outcome.   REHAB POTENTIAL: Good  CLINICAL DECISION MAKING: Evolving/moderate complexity  EVALUATION COMPLEXITY: Moderate  GOALS: Goals reviewed with patient? Yes    LONG TERM GOALS: (STG=LTG)      Name Target Date Goal status  1 Pt will be able to verbalize understanding of pertinent lymphedema risk reduction practices relevant to her dx specifically related to skin care.  Baseline:  No knowledge 03/30/2024 Achieved at eval  2 Pt will be able to return demo and/or verbalize understanding of the post op HEP related to regaining shoulder ROM. Baseline:  No knowledge 03/30/2024 Achieved at eval  3 Pt will be able to verbalize understanding of the importance of viewing the post op After Breast CA Class video for further lymphedema risk reduction education and therapeutic exercise.  Baseline:  No knowledge 03/30/2024 Achieved at eval  4 Pt will demo she has regained full shoulder ROM and function post operatively compared to baselines.   Baseline: See objective measurements  taken today. 11/24/2023 IN Progress Still very limited for abduction    5   Pt will have decreased pain by 60% or greater for improved daily activities 11/24/2023 MET 11/22/2024    6    Pt will be educated in precautions/risk factors for lymphedema and importance of SOZO screens, compression sleeve as preventative 11/24/2023 In Progress Needs review          PLAN:  PT FREQUENCY: 2x/week  PT DURATION: 6 weeks  PLANNED INTERVENTIONS: 97164- PT Re-evaluation, 97750- Physical Performance Testing, 97110-Therapeutic exercises, 97530- Therapeutic activity, 97112- Neuromuscular re-education, 97535- Self Care, [id number]- Manual therapy, [id number]- Gait training, [id number]- Orthotic Initial, [id number]- Orthotic/Prosthetic subsequent, and Patient/Family education  PLAN FOR NEXT SESSION:  Did sleeve arrive yet?, Review HEP, manual techniques for cording, PROM, STM prn . Progress as tolerated. See hx, SOSO again at end?  Yoshiko Keleher J Anikka Marsan, PT 11/24/2024, 10:00 AM  "

## 2024-11-25 ENCOUNTER — Other Ambulatory Visit: Payer: Self-pay

## 2024-11-25 ENCOUNTER — Ambulatory Visit
Admission: RE | Admit: 2024-11-25 | Discharge: 2024-11-25 | Disposition: A | Source: Ambulatory Visit | Attending: Radiation Oncology

## 2024-11-25 DIAGNOSIS — C50412 Malignant neoplasm of upper-outer quadrant of left female breast: Secondary | ICD-10-CM | POA: Diagnosis not present

## 2024-11-25 LAB — RAD ONC ARIA SESSION SUMMARY
Course Elapsed Days: 30
Plan Fractions Treated to Date: 21
Plan Fractions Treated to Date: 21
Plan Prescribed Dose Per Fraction: 2 Gy
Plan Prescribed Dose Per Fraction: 2 Gy
Plan Total Fractions Prescribed: 25
Plan Total Fractions Prescribed: 25
Plan Total Prescribed Dose: 50 Gy
Plan Total Prescribed Dose: 50 Gy
Reference Point Dosage Given to Date: 42 Gy
Reference Point Dosage Given to Date: 42 Gy
Reference Point Session Dosage Given: 2 Gy
Reference Point Session Dosage Given: 2 Gy
Session Number: 21

## 2024-11-28 ENCOUNTER — Ambulatory Visit
Admission: RE | Admit: 2024-11-28 | Discharge: 2024-11-28 | Disposition: A | Source: Ambulatory Visit | Attending: Radiation Oncology

## 2024-11-28 ENCOUNTER — Ambulatory Visit: Admitting: Radiation Oncology

## 2024-11-28 ENCOUNTER — Ambulatory Visit: Admission: RE | Admit: 2024-11-28 | Discharge: 2024-11-28 | Attending: Radiation Oncology

## 2024-11-28 ENCOUNTER — Other Ambulatory Visit: Payer: Self-pay

## 2024-11-28 DIAGNOSIS — C50412 Malignant neoplasm of upper-outer quadrant of left female breast: Secondary | ICD-10-CM

## 2024-11-28 LAB — RAD ONC ARIA SESSION SUMMARY
Course Elapsed Days: 33
Plan Fractions Treated to Date: 22
Plan Fractions Treated to Date: 22
Plan Prescribed Dose Per Fraction: 2 Gy
Plan Prescribed Dose Per Fraction: 2 Gy
Plan Total Fractions Prescribed: 25
Plan Total Fractions Prescribed: 25
Plan Total Prescribed Dose: 50 Gy
Plan Total Prescribed Dose: 50 Gy
Reference Point Dosage Given to Date: 44 Gy
Reference Point Dosage Given to Date: 44 Gy
Reference Point Session Dosage Given: 2 Gy
Reference Point Session Dosage Given: 2 Gy
Session Number: 22

## 2024-11-28 MED ORDER — RADIAPLEXRX EX GEL: Freq: Once | CUTANEOUS | Status: AC

## 2024-11-29 ENCOUNTER — Other Ambulatory Visit: Payer: Self-pay

## 2024-11-29 ENCOUNTER — Ambulatory Visit
Admission: RE | Admit: 2024-11-29 | Discharge: 2024-11-29 | Disposition: A | Source: Ambulatory Visit | Attending: Radiation Oncology

## 2024-11-29 DIAGNOSIS — C50412 Malignant neoplasm of upper-outer quadrant of left female breast: Secondary | ICD-10-CM | POA: Diagnosis not present

## 2024-11-29 LAB — RAD ONC ARIA SESSION SUMMARY
Course Elapsed Days: 34
Plan Fractions Treated to Date: 23
Plan Fractions Treated to Date: 23
Plan Prescribed Dose Per Fraction: 2 Gy
Plan Prescribed Dose Per Fraction: 2 Gy
Plan Total Fractions Prescribed: 25
Plan Total Fractions Prescribed: 25
Plan Total Prescribed Dose: 50 Gy
Plan Total Prescribed Dose: 50 Gy
Reference Point Dosage Given to Date: 46 Gy
Reference Point Dosage Given to Date: 46 Gy
Reference Point Session Dosage Given: 2 Gy
Reference Point Session Dosage Given: 2 Gy
Session Number: 23

## 2024-11-30 ENCOUNTER — Ambulatory Visit

## 2024-11-30 ENCOUNTER — Other Ambulatory Visit: Payer: Self-pay

## 2024-11-30 ENCOUNTER — Ambulatory Visit
Admission: RE | Admit: 2024-11-30 | Discharge: 2024-11-30 | Disposition: A | Discharge status: Home / Self Care | Source: Ambulatory Visit | Attending: Radiation Oncology | Admitting: Radiation Oncology

## 2024-11-30 DIAGNOSIS — C50412 Malignant neoplasm of upper-outer quadrant of left female breast: Secondary | ICD-10-CM | POA: Diagnosis not present

## 2024-11-30 LAB — RAD ONC ARIA SESSION SUMMARY
Course Elapsed Days: 35
Plan Fractions Treated to Date: 24
Plan Fractions Treated to Date: 24
Plan Prescribed Dose Per Fraction: 2 Gy
Plan Prescribed Dose Per Fraction: 2 Gy
Plan Total Fractions Prescribed: 25
Plan Total Fractions Prescribed: 25
Plan Total Prescribed Dose: 50 Gy
Plan Total Prescribed Dose: 50 Gy
Reference Point Dosage Given to Date: 48 Gy
Reference Point Dosage Given to Date: 48 Gy
Reference Point Session Dosage Given: 2 Gy
Reference Point Session Dosage Given: 2 Gy
Session Number: 24

## 2024-12-01 ENCOUNTER — Ambulatory Visit
Admission: RE | Admit: 2024-12-01 | Discharge: 2024-12-01 | Disposition: A | Source: Ambulatory Visit | Attending: Radiation Oncology

## 2024-12-01 ENCOUNTER — Other Ambulatory Visit: Payer: Self-pay

## 2024-12-01 DIAGNOSIS — C50412 Malignant neoplasm of upper-outer quadrant of left female breast: Secondary | ICD-10-CM | POA: Diagnosis not present

## 2024-12-01 LAB — RAD ONC ARIA SESSION SUMMARY
Course Elapsed Days: 36
Plan Fractions Treated to Date: 25
Plan Fractions Treated to Date: 25
Plan Prescribed Dose Per Fraction: 2 Gy
Plan Prescribed Dose Per Fraction: 2 Gy
Plan Total Fractions Prescribed: 25
Plan Total Fractions Prescribed: 25
Plan Total Prescribed Dose: 50 Gy
Plan Total Prescribed Dose: 50 Gy
Reference Point Dosage Given to Date: 50 Gy
Reference Point Dosage Given to Date: 50 Gy
Reference Point Session Dosage Given: 2 Gy
Reference Point Session Dosage Given: 2 Gy
Session Number: 25

## 2024-12-02 ENCOUNTER — Ambulatory Visit

## 2024-12-02 ENCOUNTER — Other Ambulatory Visit: Payer: Self-pay

## 2024-12-02 ENCOUNTER — Ambulatory Visit
Admission: RE | Admit: 2024-12-02 | Discharge: 2024-12-02 | Disposition: A | Discharge status: Home / Self Care | Source: Ambulatory Visit | Attending: Radiation Oncology | Admitting: Radiation Oncology

## 2024-12-02 DIAGNOSIS — C50412 Malignant neoplasm of upper-outer quadrant of left female breast: Secondary | ICD-10-CM | POA: Diagnosis not present

## 2024-12-02 LAB — RAD ONC ARIA SESSION SUMMARY
Course Elapsed Days: 37
Plan Fractions Treated to Date: 1
Plan Prescribed Dose Per Fraction: 2 Gy
Plan Total Fractions Prescribed: 5
Plan Total Prescribed Dose: 10 Gy
Reference Point Dosage Given to Date: 2 Gy
Reference Point Session Dosage Given: 2 Gy
Session Number: 26

## 2024-12-05 ENCOUNTER — Other Ambulatory Visit: Payer: Self-pay

## 2024-12-05 ENCOUNTER — Ambulatory Visit
Admission: RE | Admit: 2024-12-05 | Discharge: 2024-12-05 | Disposition: A | Source: Ambulatory Visit | Attending: Radiation Oncology

## 2024-12-05 ENCOUNTER — Ambulatory Visit

## 2024-12-05 DIAGNOSIS — C50412 Malignant neoplasm of upper-outer quadrant of left female breast: Secondary | ICD-10-CM | POA: Diagnosis not present

## 2024-12-05 LAB — RAD ONC ARIA SESSION SUMMARY
Course Elapsed Days: 40
Plan Fractions Treated to Date: 2
Plan Prescribed Dose Per Fraction: 2 Gy
Plan Total Fractions Prescribed: 5
Plan Total Prescribed Dose: 10 Gy
Reference Point Dosage Given to Date: 4 Gy
Reference Point Session Dosage Given: 2 Gy
Session Number: 27

## 2024-12-06 ENCOUNTER — Ambulatory Visit: Admission: RE | Admit: 2024-12-06 | Discharge: 2024-12-06 | Attending: Radiation Oncology

## 2024-12-06 ENCOUNTER — Ambulatory Visit

## 2024-12-06 ENCOUNTER — Other Ambulatory Visit: Payer: Self-pay

## 2024-12-06 ENCOUNTER — Ambulatory Visit: Admission: RE | Admit: 2024-12-06 | Source: Ambulatory Visit

## 2024-12-06 ENCOUNTER — Inpatient Hospital Stay: Admitting: Hematology and Oncology

## 2024-12-06 VITALS — Wt 172.2 lb

## 2024-12-06 DIAGNOSIS — Z9189 Other specified personal risk factors, not elsewhere classified: Secondary | ICD-10-CM

## 2024-12-06 DIAGNOSIS — M25612 Stiffness of left shoulder, not elsewhere classified: Secondary | ICD-10-CM

## 2024-12-06 DIAGNOSIS — C50412 Malignant neoplasm of upper-outer quadrant of left female breast: Secondary | ICD-10-CM

## 2024-12-06 DIAGNOSIS — L905 Scar conditions and fibrosis of skin: Secondary | ICD-10-CM

## 2024-12-06 DIAGNOSIS — R293 Abnormal posture: Secondary | ICD-10-CM

## 2024-12-06 LAB — RAD ONC ARIA SESSION SUMMARY
Course Elapsed Days: 41
Plan Fractions Treated to Date: 3
Plan Prescribed Dose Per Fraction: 2 Gy
Plan Total Fractions Prescribed: 5
Plan Total Prescribed Dose: 10 Gy
Reference Point Dosage Given to Date: 6 Gy
Reference Point Session Dosage Given: 2 Gy
Session Number: 28

## 2024-12-06 NOTE — Assessment & Plan Note (Signed)
 03/22/2024:Screening mammogram detected 2 masses in the left breast UOQ.  Mammogram measured 1.3 cm and 1.1 cm.  Contrast-enhanced mammogram was performed which revealed additional smaller masses (2.4 cm and 1.1 cm) spanning 7 x 3 cm.  Ultrasound measured the mass at 1.6 cm and 0.8 cm along with 2 enlarged axillary lymph nodes 1 of which was biopsy positive.  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    Treatment plan Neoadjuvant chemotherapy with dose dense Adriamycin  and Cytoxan  every 2 weeks x 4 followed by Taxol  weekly x 8 (04/06/2024-07/13/2024) 08/23/2024:Left lumpectomy: 2 separate primary tumors 1.1 cm grade 1 IDC, 1.9 cm grade 1 IDC, intermediate grade DCIS, margins negative, 5/5 lymph nodes positive with focal extranodal extension ER 95%, PR 100%, HER2 negative, Ki67 15%  09/14/2024: ALND: 3/17 nodes positive (total of 8/23 lymph nodes positive) Adjuvant radiation therapy 10/27/2024-12/09/2024 Adjuvant antiestrogen therapy to start 12/18/2024 CT CAP 04/06/2024: Left breast mass, left axillary lymph nodes, right hepatic lesion Liver MRI 04/11/2024: Liver hemangioma: Benign Hospitalization 09/24/2024-10/04/2024: Sepsis/cellulitis at the surgical site ----------------------------------------------------------------------------------------------- Antiestrogen therapy counseling:

## 2024-12-06 NOTE — Therapy (Signed)
 " OUTPATIENT PHYSICAL THERAPY  UPPER EXTREMITY ONCOLOGY TREATMENT  Patient Name: Wanda Garcia MRN: [id number] DOB:[birth year], 49 y.o., female Today's Date: 12/06/2024  END OF SESSION:  PT End of Session - 12/06/24 0920     Visit Number 13    Number of Visits 16    Date for Recertification  01/10/25    Authorization Type [employer]    Authorization Time Period 11/26-2/24/2025    Authorization - Visit Number 12    Authorization - Number of Visits 15    PT Start Time [phone]   pt arrived late from radiation   PT Stop Time 1001    PT Time Calculation (min) 45 min    Activity Tolerance Patient tolerated treatment well    Behavior During Therapy [facility] for tasks assessed/performed           Past Medical History:  Diagnosis Date   Arthritis    Breast cancer (HCC) 03/2024   left   Depression    situational   Headache    Neuropathy 07/20/2024   in fingers and toes   Past Surgical History:  Procedure Laterality Date   AXILLARY LYMPH NODE DISSECTION Left 09/14/2024   Procedure: LYMPHADENECTOMY, AXILLARY;  Surgeon: Aron Shoulders, MD;  Location: [facility];  Service: General;  Laterality: Left;  LEFT AXILLARY LYMPH NODE DISSECTION GEN w/PEC BLOCK   AXILLARY SENTINEL NODE BIOPSY Left 08/23/2024   Procedure: LEFT SENTINEL NODE BIOPSY AXILLARY;  Surgeon: Aron Shoulders, MD;  Location: [facility];  Service: General;  Laterality: Left;  LEFT SENTINEL NODE BIOPSY   BREAST BIOPSY Left 03/2024   CESAREAN SECTION N/A 04/06/2017   Procedure: CESAREAN SECTION;  Surgeon: Herchel Gloris LABOR, MD;  Location: [facility];  Service: Obstetrics;  Laterality: N/A;   CHOLECYSTECTOMY N/A 04/26/2019   Procedure: LAPAROSCOPIC CHOLECYSTECTOMY WITH POSSIBLE INTRAOPERATIVE CHOLANGIOGRAM;  Surgeon: Vernetta Berg, MD;  Location: [facility];  Service: General;  Laterality: N/A;   CYST EXCISION Right 2024   chest   IRRIGATION AND DEBRIDEMENT HEMATOMA Left 09/28/2024   Procedure: IRRIGATION AND DEBRIDEMENT  HEMATOMA;  Surgeon: Aron Shoulders, MD;  Location: [facility];  Service: General;  Laterality: Left;  washout left axilla   PORT-A-CATH REMOVAL N/A 08/23/2024   Procedure: REMOVAL PORT-A-CATH;  Surgeon: Aron Shoulders, MD;  Location: [facility];  Service: General;  Laterality: N/A;   PORTACATH PLACEMENT N/A 04/04/2024   Procedure: INSERTION, TUNNELED CENTRAL VENOUS DEVICE, WITH PORT;  Surgeon: Aron Shoulders, MD;  Location: [facility];  Service: General;  Laterality: N/A;   Patient Active Problem List   Diagnosis Date Noted   Sepsis (HCC) 09/24/2024   Port-A-Cath in place 04/06/2024   Malignant neoplasm of upper-outer quadrant of left breast in female, estrogen receptor positive (HCC) 03/28/2024   Neck pain 07/09/2018   Trigger finger of left thumb 07/09/2018   Pain in left wrist 07/09/2018   Pain in right wrist 07/09/2018   S/P cesarean section 04/05/2017   Advanced maternal age, primigravida in first trimester, antepartum     PCP:   REFERRING PROVIDER: Mackey Chad, MD  REFERRING DIAG: Left breast Cancer, at risk for lymphedema  THERAPY DIAG:  Stiffness of left shoulder, not elsewhere classified  Axillary web syndrome  At risk for lymphedema  Abnormal posture  Malignant neoplasm of upper-outer quadrant of left breast in female, estrogen receptor positive (HCC)  ONSET DATE: 08/23/2024  Rationale for Evaluation and Treatment: Rehabilitation  SUBJECTIVE:  SUBJECTIVE STATEMENT:   I got the sleeve but I've only worn it once because I had pain at my elbow and wrist. I feel like my arm feels more swollen since I was here last. Can you check that?    PERTINENT HISTORY:  Pt is s/p Neoadjuvant chemotherapy and Left lumpectomy with surgery performed on 08/23/2024 for 2 primary tumors. She had 5+/5 LN's removed at  that time. She had an ALND on 09/14/2024 with 3+/17 LN's for a total of 8+/22 LN's. She was hospitalized from 09/24/2024-10/04/2024 for sepsis/cellulitis at the surgical site. She had her drain removed yesterday. 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 PAIN:  PAIN:  Are you having pain? Yes NPRS scale: 4/10 Pain location: shoulder and upper arm Pain orientation: Left  PAIN TYPE: heavy and tight Pain description: intermittent  Aggravating factors: radiation is probably making everything feel worse Relieving factors: stretch   PRECAUTIONS: Right UE Lymphedema risk due to 8+/22 LN's removed, neuropathy  RED FLAGS: None   WEIGHT BEARING RESTRICTIONS: No  FALLS:  Has patient fallen in last 6 months? No  LEISURE: cares of son who is autistic  HAND DOMINANCE: right   PRIOR LEVEL OF FUNCTION: Independent  PATIENT GOALS: return to PLOF   OBJECTIVE: Note: Objective measures were completed at Evaluation unless otherwise noted.  COGNITION: Overall cognitive status: Within functional limits for tasks assessed   PALPATION: Very tender/sensitive Left UE    OBSERVATIONS / OTHER ASSESSMENTS: large breast incision healed, axillary incision covered with bandage;just had drain removed yesterday. When bandage removed and reapplied this am very little on bandage. Palpable cording left  axilla into forearm and pt very sensitive to light touch. No signs of UE lymphedema today  SENSATION: Light touch: Deficits     POSTURE: forward head, rounded shoulders   UPPER EXTREMITY STRENGTH:   UPPER EXTREMITY AROM/PROM:   A/PROM RIGHT   eval    Shoulder extension 47  Shoulder flexion 149  Shoulder abduction 154  Shoulder internal rotation 55  Shoulder external rotation 80                          (Blank rows = not tested)   A/PROM LEFT   eval LEFT 10/12/2024 LEFT 10/18/2024 LEFT 11/03/2024 LEFT 11/22/2024  Shoulder extension 40 32   58  Shoulder flexion 147 54, P! 130 P! 147 148  Shoulder  abduction 167 40, P! 103 P! 140 120 arm straight, 130 elbow flexed  Shoulder internal rotation 56      Shoulder external rotation 84                              (Blank rows = not tested)   CERVICAL AROM: All within normal limits     UPPER EXTREMITY STRENGTH: WNL   LYMPHEDEMA ASSESSMENTS (in cm):    LANDMARK RIGHT   eval RIGHT 10/12/2024  10 cm proximal to olecranon process 30.2 30.2  Olecranon process 24 24.5  10 cm proximal to ulnar styloid process 21.5 21.1  Just proximal to ulnar styloid process 14.2 14.7  Across hand at thumb web space 17.3 17.7  At base of 2nd digit 5.9 6.2  (Blank rows = not tested)   LANDMARK LEFT   eval LEFT 10/12/2024   10 cm proximal to olecranon process 28.8 29.96  Olecranon process 22.9 22.9  10 cm proximal to ulnar styloid process 20.8 21  Just proximal to ulnar styloid process 14.1 14.5  Across hand at thumb web space 17.5 17.7  At base of 2nd digit 6 6.2  (Blank rows = not tested)    QUICK DASH SURVEY: 66% ,    11/22/2024 48%                                                                                                                            TREATMENT DATE:  12/06/24: Therapeutic Exercises Pulleys into flex and abd x 2 min each with VC's to relax both shoulders during, pt initially was jerky with motions SOZO redone as pt was nervous about swelling, it was WNL's. Supine over half foam roll for following: Bil UE horz abd x 10, bil UE abd in a V x 10 with 5 sec holds Self Care Spent time explaining to pt how cording can become worse towards end of radiation and how wear of her compression sleeve can help lessen symptoms of cording. Answered her questions all with help of the interpreter.  Manual Therapy MFR to left UE cording in axilla and forearm, mildly palpable but pt reports feeling intense stretch her today PROM left shoulder flexion, abduction, and D2 with scapular depression by therapist throughout Assessed pts compression  sleeve which fits very well. It's slightly loose at the wrist but fits well at mid to upper arm where she needs most compression due to cording. She did report it felt comfortable once donned today. Encouraged her to wear this daily due to high amount of LN's removed.   11/25/2023 Measured for garment and had pt try on size 1 EW, and Size 2 reg. Size 2 fit better [phone].Order number for [employer] Size 2 compression sleeve on compression [employer]uru. Pts husband completed online form and CC info AROM bilateral flexion, scaption, horizontal abd x 5 MFR to left UE cording in axilla Cupping to cording in left UE using cocoa butter with clear cup, gently PROM left shoulder flexion, scaption, abduction, and D2 with scapular depression by therapist throughout  11/23/2023 Discussed progress toward goals Discussed ordering sleeve; husband has not done. Prefers to do here and will bring credit card/ may try [employer] with insurance Alternate AROM flexion x 5, bilateral flex x 5, bilateral scaption x 5, bilateral horizontal abd x 5, snow angels x 5 MFR to left UE cording in axilla Cupping to cording in left UE using cocoa butter with clear cup, gently PROM left shoulder flexion, scaption, abduction, and D2 with scapular depression by therapist throughout Measured AROM left shoulder for note Performed SOZO screen; well into the green and below baseline Discussed precautions for lymphedema;BP, needle sticks, high temperatures; hot tub, sauna's, sleeve for working out or repetitive motion activities.  11/16/24: Manual Therapy MFR to left UE cording in axilla STM to left pectoralis, axillary border, left lateral scapular region PROM left shoulder flexion, scaption, abduction, and D2 with scapular depression by therapist throughout Pt requested leaving at 1045  again due to her transportation      PATIENT EDUCATION:  Access Code: [email or web address] URL: [email or web address] Date:  10/27/2024 Prepared by: Grayce Sheldon  Exercises - Supine Shoulder Flexion Extension AAROM with Dowel  - 2 x daily - 7 x weekly - 1 sets - 5 reps - 5 hold - Supine Lower Trunk Rotation  - 1 x daily - 7 x weekly - 1 sets - 5 reps - 10 hold - Corner Pec Major Stretch  - 2 x daily - 7 x weekly - 1 sets - 3 reps - 15 hold Education details: Pt was instructed in 4 post op exercises; supine flexion and stargazer and sitting or standing scapular retraction, and wall slide for abd. She performed each for 5 reps holding 3-5 seconds ea Person educated: Patient Education method: Explanation, Demonstration, and Handouts Education comprehension: verbalized understanding and returned demonstration  HOME EXERCISE PROGRAM: 4 post op exercises; flexion and stargazer in supine, scapular retraction sitting or standing, wall slides for abd in standing Supine wand, corner pec stretch, LTR ASSESSMENT:  CLINICAL IMPRESSION: Pt arrived late due to coming from radiation. She reports her skin feeling very tender at this time due to being at the end of her radiation, she finishes this week. Her skin, though very red and irritated, is still intact. Continued with A/AA/ROM stretches and manual therapy. She reports cording feeling very tight in her forearm more so today than upper arm so focused MFR here. Then donned her compression sleeve after session assessing fit, see above.   OBJECTIVE IMPAIRMENTS: decreased activity tolerance, decreased knowledge of condition, decreased ROM, decreased strength, impaired sensation, impaired UE functional use, postural dysfunction, and pain.   ACTIVITY LIMITATIONS: carrying, lifting, sleeping, bed mobility, dressing, reach over head, hygiene/grooming, and caring for others  PARTICIPATION LIMITATIONS: cleaning, laundry, community activity, and caring for her autistic child  PERSONAL FACTORS: 3+ comorbidities: left breast cancer s/p neoadjuvant chemo therapy, several surgeries,  sepsis, 22 LN's removed, pending radiation are also affecting patient's functional outcome.   REHAB POTENTIAL: Good  CLINICAL DECISION MAKING: Evolving/moderate complexity  EVALUATION COMPLEXITY: Moderate  GOALS: Goals reviewed with patient? Yes    LONG TERM GOALS: (STG=LTG)      Name Target Date Goal status  1 Pt will be able to verbalize understanding of pertinent lymphedema risk reduction practices relevant to her dx specifically related to skin care.  Baseline:  No knowledge 03/30/2024 Achieved at eval  2 Pt will be able to return demo and/or verbalize understanding of the post op HEP related to regaining shoulder ROM. Baseline:  No knowledge 03/30/2024 Achieved at eval  3 Pt will be able to verbalize understanding of the importance of viewing the post op After Breast CA Class video for further lymphedema risk reduction education and therapeutic exercise.  Baseline:  No knowledge 03/30/2024 Achieved at eval  4 Pt will demo she has regained full shoulder ROM and function post operatively compared to baselines.  Baseline: See objective measurements taken today. 11/24/2023 IN Progress Still very limited for abduction    5   Pt will have decreased pain by 60% or greater for improved daily activities 11/24/2023 MET 11/22/2024    6    Pt will be educated in precautions/risk factors for lymphedema and importance of SOZO screens, compression sleeve as preventative 11/24/2023 In Progress Needs review          PLAN:  PT FREQUENCY: 2x/week  PT DURATION: 6 weeks  PLANNED INTERVENTIONS: 97164- PT Re-evaluation, 97750- Physical Performance  Testing, 97110-Therapeutic exercises, 97530- Therapeutic activity, [id number]- Neuromuscular re-education, [phone]- Self Care, 02859- Manual therapy, [id number]- Gait training, [phone]- Orthotic Initial, [phone]- Orthotic/Prosthetic subsequent, and Patient/Family education  PLAN FOR NEXT SESSION:  Measure A/ROM to assess after radiation; Review HEP, manual techniques  for cording, PROM, STM prn . Progress as tolerated. See hx, SOZO again at end?  Aden Berwyn Caldron, PTA 12/06/2024, 11:45 AM  "

## 2024-12-07 ENCOUNTER — Other Ambulatory Visit: Payer: Self-pay

## 2024-12-07 ENCOUNTER — Ambulatory Visit
Admission: RE | Admit: 2024-12-07 | Discharge: 2024-12-07 | Disposition: A | Source: Ambulatory Visit | Attending: Radiation Oncology

## 2024-12-07 ENCOUNTER — Ambulatory Visit: Admission: RE | Admit: 2024-12-07 | Discharge: 2024-12-07 | Attending: Radiation Oncology

## 2024-12-07 DIAGNOSIS — C50412 Malignant neoplasm of upper-outer quadrant of left female breast: Secondary | ICD-10-CM | POA: Diagnosis not present

## 2024-12-07 LAB — RAD ONC ARIA SESSION SUMMARY
Course Elapsed Days: 42
Plan Fractions Treated to Date: 4
Plan Prescribed Dose Per Fraction: 2 Gy
Plan Total Fractions Prescribed: 5
Plan Total Prescribed Dose: 10 Gy
Reference Point Dosage Given to Date: 8 Gy
Reference Point Session Dosage Given: 2 Gy
Session Number: 29

## 2024-12-08 ENCOUNTER — Other Ambulatory Visit: Payer: Self-pay

## 2024-12-08 ENCOUNTER — Ambulatory Visit
Admission: RE | Admit: 2024-12-08 | Discharge: 2024-12-08 | Disposition: A | Discharge status: Home / Self Care | Source: Ambulatory Visit | Attending: Radiation Oncology | Admitting: Radiation Oncology

## 2024-12-08 ENCOUNTER — Ambulatory Visit

## 2024-12-08 DIAGNOSIS — C50412 Malignant neoplasm of upper-outer quadrant of left female breast: Secondary | ICD-10-CM | POA: Diagnosis not present

## 2024-12-08 LAB — RAD ONC ARIA SESSION SUMMARY
Course Elapsed Days: 43
Plan Fractions Treated to Date: 5
Plan Prescribed Dose Per Fraction: 2 Gy
Plan Total Fractions Prescribed: 5
Plan Total Prescribed Dose: 10 Gy
Reference Point Dosage Given to Date: 10 Gy
Reference Point Session Dosage Given: 2 Gy
Session Number: 30

## 2024-12-09 ENCOUNTER — Ambulatory Visit

## 2024-12-10 NOTE — Radiation Completion Notes (Signed)
 Patient Name: Wanda Garcia, Wanda Garcia MRN: [id number] Date of Birth: [birth year] Referring Physician: MACKEY CHAD, M.D. Date of Service: 2024-12-10 Radiation Oncologist: Lauraine Golden, M.D. [facility]                             RADIATION ONCOLOGY END OF TREATMENT NOTE     Diagnosis: C50.412 Malignant neoplasm of upper-outer quadrant of left female breast Staging on 2024-10-05: Malignant neoplasm of upper-outer quadrant of left breast in female, estrogen receptor positive (HCC) T=pT1c, N=pN2a, M=cM0 Staging on 2024-03-30: Malignant neoplasm of upper-outer quadrant of left breast in female, estrogen receptor positive (HCC) T=cT2, N=cN1, M=cM0 Intent: Curative     ==========DELIVERED PLANS==========  First Treatment Date: 2024-10-26 Last Treatment Date: 2024-12-08   Plan Name: Breast_L_ImBH Site: Breast, Left Technique: 3D Mode: Photon Dose Per Fraction: 2 Gy Prescribed Dose (Delivered / Prescribed): 50 Gy / 50 Gy Prescribed Fxs (Delivered / Prescribed): 25 / 25   Plan Name: Breast_L_ScBH Site: Breast, Left Technique: 3D Mode: Photon Dose Per Fraction: 2 Gy Prescribed Dose (Delivered / Prescribed): 50 Gy / 50 Gy Prescribed Fxs (Delivered / Prescribed): 25 / 25   Plan Name: Brst_L_Bst_BH Site: Breast, Left Technique: 3D Mode: Photon Dose Per Fraction: 2 Gy Prescribed Dose (Delivered / Prescribed): 10 Gy / 10 Gy Prescribed Fxs (Delivered / Prescribed): 5 / 5     ==========ON TREATMENT VISIT DATES========== 2024-10-31, 2024-11-08, 2024-11-15, 2024-11-21, 2024-11-28, 2024-12-07     ==========UPCOMING VISITS========== 01/12/2025 [facility] ONC FOLLOW UP 20 Golden Lauraine, MD  12/27/2024 [facility] AT [facility] Canary Grayce PARAS, Clio  12/20/2024 [facility] REH AT [facility] Aden Berwyn LABOR, [place]  12/13/2024 [facility] REH AT [facility] [place], Robin J, PT        ==========APPENDIX - ON TREATMENT  VISIT NOTES==========   See weekly On Treatment Notes in Epic for details in the Media tab (listed as Progress notes on the On Treatment Visit Dates listed above).

## 2024-12-13 ENCOUNTER — Ambulatory Visit

## 2024-12-13 DIAGNOSIS — M25612 Stiffness of left shoulder, not elsewhere classified: Secondary | ICD-10-CM

## 2024-12-13 DIAGNOSIS — L905 Scar conditions and fibrosis of skin: Secondary | ICD-10-CM

## 2024-12-13 DIAGNOSIS — R293 Abnormal posture: Secondary | ICD-10-CM

## 2024-12-13 DIAGNOSIS — C50412 Malignant neoplasm of upper-outer quadrant of left female breast: Secondary | ICD-10-CM

## 2024-12-13 DIAGNOSIS — Z9189 Other specified personal risk factors, not elsewhere classified: Secondary | ICD-10-CM

## 2024-12-13 NOTE — Therapy (Signed)
 " OUTPATIENT PHYSICAL THERAPY  UPPER EXTREMITY ONCOLOGY TREATMENT  Patient Name: Wanda Garcia MRN: [id number] DOB:05/02/1976, 49 y.o., female Today's Date: 12/13/2024  END OF SESSION:  PT End of Session - 12/13/24 1006     Visit Number 14    Number of Visits 16    Date for Recertification  01/10/25    Authorization Type [employer]    Authorization Time Period 11/26-2/24/2025    Authorization - Visit Number 13    Authorization - Number of Visits 15    PT Start Time 1006    PT Stop Time 1104    PT Time Calculation (min) 58 min    Activity Tolerance Patient tolerated treatment well    Behavior During Therapy WFL for tasks assessed/performed           Past Medical History:  Diagnosis Date   Arthritis    Breast cancer (HCC) 03/2024   left   Depression    situational   Headache    Neuropathy 07/20/2024   in fingers and toes   Past Surgical History:  Procedure Laterality Date   AXILLARY LYMPH NODE DISSECTION Left 09/14/2024   Procedure: LYMPHADENECTOMY, AXILLARY;  Surgeon: Aron Shoulders, MD;  Location: [facility];  Service: General;  Laterality: Left;  LEFT AXILLARY LYMPH NODE DISSECTION GEN w/PEC BLOCK   AXILLARY SENTINEL NODE BIOPSY Left 08/23/2024   Procedure: LEFT SENTINEL NODE BIOPSY AXILLARY;  Surgeon: Aron Shoulders, MD;  Location: [facility];  Service: General;  Laterality: Left;  LEFT SENTINEL NODE BIOPSY   BREAST BIOPSY Left 03/2024   CESAREAN SECTION N/A 04/06/2017   Procedure: CESAREAN SECTION;  Surgeon: Herchel Gloris LABOR, MD;  Location: [facility];  Service: Obstetrics;  Laterality: N/A;   CHOLECYSTECTOMY N/A 04/26/2019   Procedure: LAPAROSCOPIC CHOLECYSTECTOMY WITH POSSIBLE INTRAOPERATIVE CHOLANGIOGRAM;  Surgeon: Vernetta Berg, MD;  Location: [facility];  Service: General;  Laterality: N/A;   CYST EXCISION Right 2024   chest   IRRIGATION AND DEBRIDEMENT HEMATOMA Left 09/28/2024   Procedure: IRRIGATION AND DEBRIDEMENT HEMATOMA;  Surgeon: Aron Shoulders, MD;   Location: [facility];  Service: General;  Laterality: Left;  washout left axilla   PORT-A-CATH REMOVAL N/A 08/23/2024   Procedure: REMOVAL PORT-A-CATH;  Surgeon: Aron Shoulders, MD;  Location: [facility];  Service: General;  Laterality: N/A;   PORTACATH PLACEMENT N/A 04/04/2024   Procedure: INSERTION, TUNNELED CENTRAL VENOUS DEVICE, WITH PORT;  Surgeon: Aron Shoulders, MD;  Location: [facility];  Service: General;  Laterality: N/A;   Patient Active Problem List   Diagnosis Date Noted   Sepsis (HCC) 09/24/2024   Port-A-Cath in place 04/06/2024   Malignant neoplasm of upper-outer quadrant of left breast in female, estrogen receptor positive (HCC) 03/28/2024   Neck pain 07/09/2018   Trigger finger of left thumb 07/09/2018   Pain in left wrist 07/09/2018   Pain in right wrist 07/09/2018   S/P cesarean section 04/05/2017   Advanced maternal age, primigravida in first trimester, antepartum     PCP:   REFERRING PROVIDER: Mackey Chad, MD  REFERRING DIAG: Left breast Cancer, at risk for lymphedema  THERAPY DIAG:  Stiffness of left shoulder, not elsewhere classified  Axillary web syndrome  At risk for lymphedema  Abnormal posture  Malignant neoplasm of upper-outer quadrant of left breast in female, estrogen receptor positive (HCC)  ONSET DATE: 08/23/2024  Rationale for Evaluation and Treatment: Rehabilitation  SUBJECTIVE:  SUBJECTIVE STATEMENT:    My arm is feeling better and I finished my radiation on 12/08/2024. I feel like my arm is more swollen, but my screen was OK. I feel like my rings are tight. I have been wearing the sleeve, but I don't have it on today. My small finger and thumb hurt on the outside when I close my fist. No interpreter present but pt declined using electronic interpreter and understood all  very well.  PERTINENT HISTORY:  Pt is s/p Neoadjuvant chemotherapy and Left lumpectomy with surgery performed on 08/23/2024 for 2 primary tumors. She had 5+/5 LN's removed at that time. She had an ALND on 09/14/2024 with 3+/17 LN's for a total of 8+/22 LN's. She was hospitalized from 09/24/2024-10/04/2024 for sepsis/cellulitis at the surgical site. She had her drain removed yesterday. 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 PAIN:  PAIN:  Are you having pain? Yes NPRS scale: 4/10 Pain location: posterior shoulder and upper arm numbness, outside of thumb and small finger. Pain orientation: Left  PAIN TYPE: heavy some times Pain description: intermittent  Aggravating factors: radiation is probably making everything feel worse Relieving factors: stretch   PRECAUTIONS: Right UE Lymphedema risk due to 8+/22 LN's removed, neuropathy  RED FLAGS: None   WEIGHT BEARING RESTRICTIONS: No  FALLS:  Has patient fallen in last 6 months? No  LEISURE: cares of son who is autistic  HAND DOMINANCE: right   PRIOR LEVEL OF FUNCTION: Independent  PATIENT GOALS: return to PLOF   OBJECTIVE: Note: Objective measures were completed at Evaluation unless otherwise noted.  COGNITION: Overall cognitive status: Within functional limits for tasks assessed   PALPATION: Very tender/sensitive Left UE    OBSERVATIONS / OTHER ASSESSMENTS: large breast incision healed, axillary incision covered with bandage;just had drain removed yesterday. When bandage removed and reapplied this am very little on bandage. Palpable cording left  axilla into forearm and pt very sensitive to light touch. No signs of UE lymphedema today  SENSATION: Light touch: Deficits     POSTURE: forward head, rounded shoulders   UPPER EXTREMITY STRENGTH:   UPPER EXTREMITY AROM/PROM:   A/PROM RIGHT   eval    Shoulder extension 47  Shoulder flexion 149  Shoulder abduction 154  Shoulder internal rotation 55  Shoulder external  rotation 80                          (Blank rows = not tested)   A/PROM LEFT   eval LEFT 10/12/2024 LEFT 10/18/2024 LEFT 11/03/2024 LEFT 11/22/2024   Shoulder extension 40 32   58 59  Shoulder flexion 147 54, P! 130 P! 147 148 146  Shoulder abduction 167 40, P! 103 P! 140 120 arm straight, 130 elbow flexed 145  Shoulder internal rotation 56     70  Shoulder external rotation 84     93                          (Blank rows = not tested)   CERVICAL AROM: All within normal limits     UPPER EXTREMITY STRENGTH: WNL   LYMPHEDEMA ASSESSMENTS (in cm):    LANDMARK RIGHT   eval RIGHT 10/12/2024 RIGHT 12/13/2024  10 cm proximal to olecranon process 30.2 30.2 30.3  Olecranon process 24 24.5 24.7  10 cm proximal to ulnar styloid process 21.5 21.1 21.3  Just proximal to ulnar styloid process 14.2 14.7 14.9  Across hand at thumb web space 17.3 17.7 17.8  At base of 2nd digit 5.9 6.2 6.3  (Blank rows = not tested)   LANDMARK LEFT   eval LEFT 10/12/2024  LEFT 12/13/2024  10 cm proximal to olecranon process 28.8 29.96 31  Olecranon process 22.9 22.9 23.1  10 cm proximal to ulnar styloid process 20.8 21 20.9  Just proximal to ulnar styloid process 14.1 14.5 14.5  Across hand at thumb web space 17.5 17.7 17.6  At base of 2nd digit 6 6.2 6.3  (Blank rows = not tested)    QUICK DASH SURVEY: 66% ,    11/22/2024 48%                                                                                                                            TREATMENT DATE:   12/13/2024 Measured bilateral UE circumference due to concerns of swelling; no signs of lymphedema Measured AROM left shoulder; limited mainly with abduction Half foam roll: bilateral UE flexion, scaption, Horizontal abduction ea x 5, snow angels x 6 ea Lower trunk rotation B x 5 with arms extended Manual Therapy MFR to left UE cording in axilla and forearm, mildly palpable but pt reports feeling intense stretch her today Dynamic  cupping with cocoa butter to cording in upper arm, antecubital fosssa and forearm. Tried smallest cup at small finger but did not adhere well. PROM left shoulder flexion, abduction, and D2 with scapular depression by therapist throughout  12/06/24: Therapeutic Exercises Pulleys into flex and abd x 2 min each with VC's to relax both shoulders during, pt initially was jerky with motions SOZO redone as pt was nervous about swelling, it was WNL's. Supine over half foam roll for following: Bil UE horz abd x 10, bil UE abd in a V x 10 with 5 sec holds Self Care Spent time explaining to pt how cording can become worse towards end of radiation and how wear of her compression sleeve can help lessen symptoms of cording. Answered her questions all with help of the interpreter.  Manual Therapy MFR to left UE cording in axilla and forearm, mildly palpable but pt reports feeling intense stretch her today PROM left shoulder flexion, abduction, and D2 with scapular depression by therapist throughout Assessed pts compression sleeve which fits very well. It's slightly loose at the wrist but fits well at mid to upper arm where she needs most compression due to cording. She did report it felt comfortable once donned today. Encouraged her to wear this daily due to high amount of LN's removed.   11/25/2023 Measured for garment and had pt try on size 1 EW, and Size 2 reg. Size 2 fit better [id number].Order number for [employer] Size 2 compression sleeve on compression [employer]. Pts husband completed online form and CC info AROM bilateral flexion, scaption, horizontal abd x 5 MFR to left UE cording in axilla Cupping to cording in left UE using cocoa butter with  clear cup, gently PROM left shoulder flexion, scaption, abduction, and D2 with scapular depression by therapist throughout  11/23/2023 Discussed progress toward goals Discussed ordering sleeve; husband has not done. Prefers to do here and will bring credit card/  may try [employer] with insurance Alternate AROM flexion x 5, bilateral flex x 5, bilateral scaption x 5, bilateral horizontal abd x 5, snow angels x 5 MFR to left UE cording in axilla Cupping to cording in left UE using cocoa butter with clear cup, gently PROM left shoulder flexion, scaption, abduction, and D2 with scapular depression by therapist throughout Measured AROM left shoulder for note Performed SOZO screen; well into the green and below baseline Discussed precautions for lymphedema;BP, needle sticks, high temperatures; hot tub, sauna's, sleeve for working out or repetitive motion activities.  11/16/24: Manual Therapy MFR to left UE cording in axilla STM to left pectoralis, axillary border, left lateral scapular region PROM left shoulder flexion, scaption, abduction, and D2 with scapular depression by therapist throughout Pt requested leaving at 1045 again due to her transportation      PATIENT EDUCATION:  Access Code: [email or web address] URL: [email or web address] Date: 10/27/2024 Prepared by: Grayce Sheldon  Exercises - Supine Shoulder Flexion Extension AAROM with Dowel  - 2 x daily - 7 x weekly - 1 sets - 5 reps - 5 hold - Supine Lower Trunk Rotation  - 1 x daily - 7 x weekly - 1 sets - 5 reps - 10 hold - Corner Pec Major Stretch  - 2 x daily - 7 x weekly - 1 sets - 3 reps - 15 hold Education details: Pt was instructed in 4 post op exercises; supine flexion and stargazer and sitting or standing scapular retraction, and wall slide for abd. She performed each for 5 reps holding 3-5 seconds ea Person educated: Patient Education method: Explanation, Demonstration, and Handouts Education comprehension: verbalized understanding and returned demonstration  HOME EXERCISE PROGRAM: 4 post op exercises; flexion and stargazer in supine, scapular retraction sitting or standing, wall slides for abd in standing Supine wand, corner pec stretch, LTR ASSESSMENT:  CLINICAL  IMPRESSION:  Meausred bilateral UE circumferences and left shoulder AROM. No sign of lymphedema in left UE, and ROM doing well overall with limitation mainly in left shoulder abd. Arm feels heavy likely due to weakness. Will start gentle strengthening, being aware of continued cording.  OBJECTIVE IMPAIRMENTS: decreased activity tolerance, decreased knowledge of condition, decreased ROM, decreased strength, impaired sensation, impaired UE functional use, postural dysfunction, and pain.   ACTIVITY LIMITATIONS: carrying, lifting, sleeping, bed mobility, dressing, reach over head, hygiene/grooming, and caring for others  PARTICIPATION LIMITATIONS: cleaning, laundry, community activity, and caring for her autistic child  PERSONAL FACTORS: 3+ comorbidities: left breast cancer s/p neoadjuvant chemo therapy, several surgeries, sepsis, 22 LN's removed, pending radiation are also affecting patient's functional outcome.   REHAB POTENTIAL: Good  CLINICAL DECISION MAKING: Evolving/moderate complexity  EVALUATION COMPLEXITY: Moderate  GOALS: Goals reviewed with patient? Yes    LONG TERM GOALS: (STG=LTG)      Name Target Date Goal status  1 Pt will be able to verbalize understanding of pertinent lymphedema risk reduction practices relevant to her dx specifically related to skin care.  Baseline:  No knowledge 03/30/2024 Achieved at eval  2 Pt will be able to return demo and/or verbalize understanding of the post op HEP related to regaining shoulder ROM. Baseline:  No knowledge 03/30/2024 Achieved at eval  3 Pt will be able to verbalize understanding of the importance  of viewing the post op After Breast CA Class video for further lymphedema risk reduction education and therapeutic exercise.  Baseline:  No knowledge 03/30/2024 Achieved at eval  4 Pt will demo she has regained full shoulder ROM and function post operatively compared to baselines.  Baseline: See objective measurements taken today. 11/24/2023  IN Progress Still very limited for abduction    5   Pt will have decreased pain by 60% or greater for improved daily activities 11/24/2023 MET 11/22/2024    6    Pt will be educated in precautions/risk factors for lymphedema and importance of SOZO screens, compression sleeve as preventative 11/24/2023 In Progress Needs review          PLAN:  PT FREQUENCY: 2x/week  PT DURATION: 6 weeks  PLANNED INTERVENTIONS: 97164- PT Re-evaluation, 97750- Physical Performance Testing, 97110-Therapeutic exercises, 97530- Therapeutic activity, 97112- Neuromuscular re-education, 97535- Self Care, [id number]- Manual therapy, [id number]- Gait training, [id number]- Orthotic Initial, [id number]- Orthotic/Prosthetic subsequent, and Patient/Family education  PLAN FOR NEXT SESSION:  start gentle strength being mindful of cords, jobes flex, scaption, abd, etc, Review HEP, manual techniques for cording, PROM, STM prn . Progress as tolerated. See hx, SOZO again at end?  Grayce JINNY Sheldon, PT 12/13/2024, 11:10 AM  "

## 2024-12-20 ENCOUNTER — Ambulatory Visit

## 2024-12-27 ENCOUNTER — Ambulatory Visit

## 2025-01-12 ENCOUNTER — Ambulatory Visit: Admitting: Radiation Oncology

## 2025-01-13 ENCOUNTER — Ambulatory Visit: Admitting: Radiation Oncology

## 2025-03-21 ENCOUNTER — Inpatient Hospital Stay: Admitting: Adult Health

## 2025-03-21 ENCOUNTER — Inpatient Hospital Stay
